# Patient Record
Sex: Male | Born: 1960 | Race: White | Hispanic: No | Marital: Single | Smoking: Current every day smoker
Health system: Southern US, Community
[De-identification: ages and names within clinical notes are randomized; demographics above are authoritative.]

## PROBLEM LIST (undated history)

## (undated) DIAGNOSIS — Z8719 Personal history of other diseases of the digestive system: Secondary | ICD-10-CM

## (undated) DIAGNOSIS — Z87898 Personal history of other specified conditions: Secondary | ICD-10-CM

## (undated) DIAGNOSIS — K219 Gastro-esophageal reflux disease without esophagitis: Secondary | ICD-10-CM

## (undated) DIAGNOSIS — K859 Acute pancreatitis without necrosis or infection, unspecified: Secondary | ICD-10-CM

## (undated) DIAGNOSIS — R441 Visual hallucinations: Secondary | ICD-10-CM

## (undated) DIAGNOSIS — B1920 Unspecified viral hepatitis C without hepatic coma: Secondary | ICD-10-CM

## (undated) DIAGNOSIS — F1010 Alcohol abuse, uncomplicated: Secondary | ICD-10-CM

## (undated) DIAGNOSIS — T50901A Poisoning by unspecified drugs, medicaments and biological substances, accidental (unintentional), initial encounter: Secondary | ICD-10-CM

## (undated) HISTORY — PX: WRIST SURGERY: SHX841

## (undated) HISTORY — PX: FOOT SURGERY: SHX648

## (undated) HISTORY — DX: Poisoning by unspecified drugs, medicaments and biological substances, accidental (unintentional), initial encounter: T50.901A

## (undated) HISTORY — DX: Visual hallucinations: R44.1

---

## 2010-01-23 ENCOUNTER — Emergency Department: Payer: Self-pay | Admitting: Emergency Medicine

## 2014-08-24 DIAGNOSIS — Z720 Tobacco use: Secondary | ICD-10-CM | POA: Diagnosis present

## 2014-08-24 DIAGNOSIS — R7401 Elevation of levels of liver transaminase levels: Secondary | ICD-10-CM | POA: Diagnosis present

## 2014-08-24 DIAGNOSIS — F1010 Alcohol abuse, uncomplicated: Secondary | ICD-10-CM

## 2014-08-24 DIAGNOSIS — R251 Tremor, unspecified: Secondary | ICD-10-CM

## 2014-08-24 HISTORY — DX: Elevation of levels of liver transaminase levels: R74.01

## 2014-08-24 HISTORY — DX: Tremor, unspecified: R25.1

## 2014-08-25 DIAGNOSIS — K859 Acute pancreatitis without necrosis or infection, unspecified: Secondary | ICD-10-CM | POA: Diagnosis present

## 2014-08-25 DIAGNOSIS — B182 Chronic viral hepatitis C: Secondary | ICD-10-CM | POA: Insufficient documentation

## 2014-08-25 DIAGNOSIS — N3000 Acute cystitis without hematuria: Secondary | ICD-10-CM

## 2014-08-25 HISTORY — DX: Acute cystitis without hematuria: N30.00

## 2015-02-14 ENCOUNTER — Encounter: Payer: Self-pay | Admitting: Emergency Medicine

## 2015-02-14 ENCOUNTER — Emergency Department
Admission: EM | Admit: 2015-02-14 | Discharge: 2015-02-16 | Disposition: A | Discharge status: Home / Self Care | Payer: Self-pay | Attending: Emergency Medicine | Admitting: Emergency Medicine

## 2015-02-14 DIAGNOSIS — F10230 Alcohol dependence with withdrawal, uncomplicated: Secondary | ICD-10-CM

## 2015-02-14 DIAGNOSIS — F1010 Alcohol abuse, uncomplicated: Secondary | ICD-10-CM | POA: Insufficient documentation

## 2015-02-14 DIAGNOSIS — F10932 Alcohol use, unspecified with withdrawal with perceptual disturbance: Secondary | ICD-10-CM

## 2015-02-14 DIAGNOSIS — Z720 Tobacco use: Secondary | ICD-10-CM | POA: Insufficient documentation

## 2015-02-14 DIAGNOSIS — F1910 Other psychoactive substance abuse, uncomplicated: Secondary | ICD-10-CM | POA: Insufficient documentation

## 2015-02-14 DIAGNOSIS — F1090 Alcohol use, unspecified, uncomplicated: Secondary | ICD-10-CM

## 2015-02-14 HISTORY — DX: Unspecified viral hepatitis C without hepatic coma: B19.20

## 2015-02-14 HISTORY — DX: Alcohol abuse, uncomplicated: F10.10

## 2015-02-14 HISTORY — DX: Acute pancreatitis without necrosis or infection, unspecified: K85.90

## 2015-02-14 LAB — COMPREHENSIVE METABOLIC PANEL
ALBUMIN: 4.3 g/dL (ref 3.5–5.0)
ALK PHOS: 59 U/L (ref 38–126)
ALT: 150 U/L — ABNORMAL HIGH (ref 17–63)
ANION GAP: 10 (ref 5–15)
AST: 172 U/L — AB (ref 15–41)
BUN: 11 mg/dL (ref 6–20)
CO2: 26 mmol/L (ref 22–32)
CREATININE: 0.68 mg/dL (ref 0.61–1.24)
Calcium: 9.1 mg/dL (ref 8.9–10.3)
Chloride: 101 mmol/L (ref 101–111)
GFR calc Af Amer: >60 mL/min (ref >60)
GFR calc non Af Amer: >60 mL/min (ref >60)
Glucose, Bld: 132 mg/dL — ABNORMAL HIGH (ref 65–99)
POTASSIUM: 3.8 mmol/L (ref 3.5–5.1)
Sodium: 137 mmol/L (ref 135–145)
Total Bilirubin: 0.5 mg/dL (ref 0.3–1.2)
Total Protein: 8.5 g/dL — ABNORMAL HIGH (ref 6.5–8.1)

## 2015-02-14 LAB — CBC WITH DIFFERENTIAL/PLATELET
Basophils Absolute: 0.4 10*3/uL — ABNORMAL HIGH (ref 0–0.1)
Basophils Relative: 4 %
EOS PCT: 1 %
Eosinophils Absolute: 0.1 10*3/uL (ref 0–0.7)
HCT: 47.5 % (ref 40.0–52.0)
Hemoglobin: 16.1 g/dL (ref 13.0–18.0)
LYMPHS ABS: 3 10*3/uL (ref 1.0–3.6)
LYMPHS PCT: 37 %
MCH: 31.2 pg (ref 26.0–34.0)
MCHC: 33.8 g/dL (ref 32.0–36.0)
MCV: 92.4 fL (ref 80.0–100.0)
MONOS PCT: 6 %
Monocytes Absolute: 0.5 10*3/uL (ref 0.2–1.0)
Neutro Abs: 4.1 10*3/uL (ref 1.4–6.5)
Neutrophils Relative %: 52 %
Platelets: 209 10*3/uL (ref 150–440)
RBC: 5.14 MIL/uL (ref 4.40–5.90)
RDW: 12.8 % (ref 11.5–14.5)
WBC: 8 10*3/uL (ref 3.8–10.6)

## 2015-02-14 LAB — URINALYSIS COMPLETE WITH MICROSCOPIC (ARMC ONLY)
Bilirubin Urine: NEGATIVE
Glucose, UA: NEGATIVE mg/dL
HGB URINE DIPSTICK: NEGATIVE
Ketones, ur: NEGATIVE mg/dL
LEUKOCYTES UA: NEGATIVE
Nitrite: NEGATIVE
PROTEIN: NEGATIVE mg/dL
SQUAMOUS EPITHELIAL / LPF: NONE SEEN
Specific Gravity, Urine: 1.006 (ref 1.005–1.030)
pH: 5 (ref 5.0–8.0)

## 2015-02-14 LAB — ETHANOL: Alcohol, Ethyl (B): 319 mg/dL (ref <5)

## 2015-02-14 MED ORDER — LORAZEPAM 2 MG PO TABS: 0.0000 mg | ORAL_TABLET | Freq: Two times a day (BID) | ORAL | Status: DC

## 2015-02-14 MED ORDER — LORAZEPAM 2 MG PO TABS: 2.0000 mg | ORAL_TABLET | Freq: Once | ORAL | Status: DC

## 2015-02-14 MED ORDER — VITAMIN B-1 100 MG PO TABS
100.0000 mg | ORAL_TABLET | Freq: Every day | ORAL | Status: DC
  Administered 2015-02-14 – 2015-02-15 (×2): 100 mg via ORAL
  Filled 2015-02-14 (×2): qty 1

## 2015-02-14 MED ORDER — LORAZEPAM 2 MG PO TABS
0.0000 mg | ORAL_TABLET | Freq: Four times a day (QID) | ORAL | Status: DC
  Administered 2015-02-15: 2 mg via ORAL
  Filled 2015-02-14: qty 1

## 2015-02-14 NOTE — ED Notes (Signed)
Patient reports seeking help for alcohol addiction.  Reports he drinks from sun up to sun down, last drink was approx 5 minutes prior to arrival.

## 2015-02-14 NOTE — ED Notes (Signed)
Pt brought back to room 21b - changed into scrubs by nurse matt -

## 2015-02-14 NOTE — ED Notes (Signed)
BEHAVIORAL HEALTH ROUNDING Patient sleeping: No. Patient alert and oriented: yes Behavior appropriate: Yes.  ; If no, describe:  Nutrition and fluids offered: Yes  Toileting and hygiene offered: Yes  Sitter present: yes Law enforcement present: Yes  

## 2015-02-14 NOTE — ED Notes (Signed)
Patient assigned to appropriate care area. Patient oriented to unit/care area: Informed that, for their safety, care areas are designed for safety and monitored by security cameras at all times; and visiting hours explained to patient. Patient verbalizes understanding, and verbal contract for safety obtained.  Pt brought into ED BHU via sally port escorted by ED [employer]over [employer]. Pt wanded with metal detector for safety by ODS officer. Patient oriented to unit/care area: Pt informed of unit policies and procedures.  Informed that, for their safety, care areas are designed for safety and monitored by security cameras at all times; and visiting hours explained to patient. Patient verbalizes understanding, and verbal contract for safety obtained.Pt shown to their room 2.   BEHAVIORAL HEALTH ROUNDING Patient sleeping: No. Patient alert and oriented: yes Behavior appropriate: Yes.   Nutrition and fluids offered: Yes  Toileting and hygiene offered: Yes  Sitter present: q15 min observations and security camera monitoring Law enforcement present: Yes [employer]  ENVIRONMENTAL ASSESSMENT Potentially harmful objects out of patient reach: Yes.   Personal belongings secured: Yes.   Patient dressed in hospital provided attire only: Yes.   Plastic bags out of patient reach: Yes.   Patient care equipment removed: Yes.   Equipment and supplies removed: Yes.   Potentially toxic materials out of patient reach: Yes.   Sharps container removed or out of patient reach: Yes.    ED BHU [place] Is the patient under IVC or is there intent for IVC: No. Is the patient medically cleared: Yes.   Is there vacancy in the ED BHU: Yes.   Is the population mix appropriate for patient: Yes Is the patient awaiting placement in inpatient or outpatient setting: Yes, [facility]. Has the patient had a psychiatric consult: No.   Survey of unit performed for contraband, proper placement and condition of furniture,  tampering with fixtures in bathroom, shower, and each patient room: Yes.  ; Findings: none APPEARANCE/BEHAVIOR Cooperative, calm NEURO ASSESSMENT Orientation: A&O x4 Hallucinations: No.None noted (Hallucinations) Speech: Normal Gait: normal RESPIRATORY ASSESSMENT No respiratory distress noted CARDIOVASCULAR ASSESSMENT Skin color appropriate for age and race GASTROINTESTINAL ASSESSMENT no GI distress noted EXTREMITIES Moves all extremities PLAN OF CARE Provide calm/safe environment. Vital signs assessed twice daily. ED BHU Assessment once each 12-hour shift. Collaborate with intake RN daily or as condition indicates. Assure the ED provider has rounded once each shift. Provide and encourage hygiene. Provide redirection as needed. Assess for escalating behavior; address immediately and inform ED provider.  Assess family dynamic and appropriateness for visitation as needed: Yes.   Educate the patient/family about BHU procedures/visitation: Yes.

## 2015-02-14 NOTE — ED Notes (Signed)
Pt medicated with thiamine as ordered. Pt states still unable to give urine sample

## 2015-02-14 NOTE — ED Notes (Signed)

## 2015-02-14 NOTE — ED Notes (Signed)
Report given to beth bhu

## 2015-02-14 NOTE — ED Provider Notes (Signed)
[facility] Emergency Department Provider Note     Time seen: ----------------------------------------- 8:37 PM on 02/14/2015 -----------------------------------------    I have reviewed the triage vital signs and the nursing notes.   HISTORY  Chief Complaint Drug / Alcohol Assessment    HPI Luke Rogers. is a 54 y.o. male who presents ER seeking help with alcohol addiction. Patient states he drinks from the time he gets up to the time he goes to sleep. Patient states he can't make it through the night without being jittery and shaky. States he immediately wakes up and goes to the store buys beer and coffee. He then drinks throughout the day because he works mostly by himself. He states she's having some family problems and he needs to get help.   Past Medical History  Diagnosis Date  . Alcohol abuse   . Hepatitis C   . Pancreatitis     There are no active problems to display for this patient.   Past Surgical History  Procedure Laterality Date  . Wrist surgery Right   . Foot surgery Bilateral     As infant    Allergies Review of patient's allergies indicates no known allergies.  Social History History  Substance Use Topics  . Smoking status: Current Every Day Smoker -- 1.00 packs/day    Types: Cigarettes  . Smokeless tobacco: Never Used  . Alcohol Use: Yes    Review of Systems Constitutional: Negative for fever. Eyes: Negative for visual changes. ENT: Negative for sore throat. Cardiovascular: Negative for chest pain. Respiratory: Negative for shortness of breath. Gastrointestinal: Negative for abdominal pain, vomiting and diarrhea. Genitourinary: Negative for dysuria. Musculoskeletal: Negative for back pain. Skin: Negative for rash. Neurological: Negative for headaches, focal weakness or numbness.  10-point ROS otherwise negative.  ____________________________________________   PHYSICAL EXAM:  VITAL SIGNS: ED  Triage Vitals  Enc Vitals Group     BP --      Pulse --      Resp --      Temp --      Temp src --      SpO2 --      Weight --      Height --      Head Cir --      Peak Flow --      Pain Score --      Pain Loc --      Pain Edu? --      Excl. in [place]? --     Constitutional: Alert and oriented. Well appearing and in no distress. Eyes: Conjunctivae are normal.  ENT   Head: Normocephalic and atraumatic.   Nose: No congestion/rhinnorhea.   Mouth/Throat: Mucous membranes are moist.   Neck: No stridor. Cardiovascular: Normal rate, regular rhythm. Respiratory: Normal respiratory effort without tachypnea nor retractions. Breath sounds are clear and equal bilaterally.  Gastrointestinal: Soft and nontender. No distention. No abdominal bruits.  Musculoskeletal: Nontender with normal range of motion in all extremities. No joint effusions.  No lower extremity tenderness nor edema. Neurologic:  Normal speech and language. No gross focal neurologic deficits are appreciated. Speech is normal. No gait instability.  Skin:  Skin is warm, dry and intact. No rash noted. Psychiatric: Mood and affect are normal. Speech and behavior are normal. Patient exhibits appropriate insight and judgment. Patient denies suicidal or homicidal ideation ____________________________________________  ED COURSE:  Pertinent labs & imaging results that were available during my care of the patient were reviewed by  me and considered in my medical decision making (see chart for details). Patient with severe alcoholism, we'll place on CIWA scale and attempt placement. ____________________________________________    LABS (pertinent positives/negatives)  Labs Reviewed  CBC WITH DIFFERENTIAL/PLATELET - Abnormal; Notable for the following:    Basophils Absolute 0.4 (*)    All other components within normal limits  COMPREHENSIVE METABOLIC PANEL - Abnormal; Notable for the following:    Glucose, Bld 132 (*)     Total Protein 8.5 (*)    AST 172 (*)    ALT 150 (*)    All other components within normal limits  URINALYSIS COMPLETEWITH MICROSCOPIC ([facility] ONLY) - Abnormal; Notable for the following:    Color, Urine YELLOW (*)    APPearance CLEAR (*)    Bacteria, UA FEW (*)    All other components within normal limits  ETHANOL - Abnormal; Notable for the following:    Alcohol, Ethyl (B) 319 (*)    All other components within normal limits  URINE DRUG SCREEN, QUALITATIVE ([facility] ONLY)    ____________________________________________  FINAL ASSESSMENT AND PLAN  Severe alcoholism  Plan: Patient with labs and imaging as dictated above. Patient with severe alcoholism, will need help with withdrawal or will go on to severe DTs. Patient being started on CIWA protocol, we'll have him speak to psychiatry.   Earleen Newport, MD   Earleen Newport, MD 02/15/15 [phone]

## 2015-02-14 NOTE — ED Notes (Signed)
Pt okay to go to bhu per physician

## 2015-02-15 ENCOUNTER — Encounter: Payer: Self-pay | Admitting: Emergency Medicine

## 2015-02-15 NOTE — ED Notes (Signed)
BEHAVIORAL HEALTH ROUNDING Patient sleeping: Yes.   Patient alert and oriented: not applicable Behavior appropriate: Yes.    Nutrition and fluids offered: No Toileting and hygiene offered: No Sitter present: q15 minute observations and security camera monitoring Law enforcement present: Yes [employer] 

## 2015-02-15 NOTE — ED Notes (Signed)
Pt. Noted sleeping in room. No complaints or concerns voiced. No distress or abnormal behavior noted. Will continue to monitor with security cameras. Q 15 minute rounds continue. 

## 2015-02-15 NOTE — ED Notes (Signed)

## 2015-02-15 NOTE — ED Notes (Signed)
BEHAVIORAL HEALTH ROUNDING Patient sleeping: Yes.   Patient alert and oriented: not applicable Behavior appropriate: Yes.  ; If no, describe:  Nutrition and fluids offered: Yes  Toileting and hygiene offered: Yes  Sitter present: yes 15 minute rounds being completed Law enforcement present: Yes [employer]

## 2015-02-15 NOTE — ED Notes (Signed)
BEHAVIORAL HEALTH ROUNDING Patient sleeping: NO Patient alert and oriented: yes Behavior appropriate: Yes.  ; If no, describe:  Nutrition and fluids offered: Yes  Toileting and hygiene offered: Yes  Sitter present: yes 15 minute checks being completed Law enforcement present: Yes [employer]

## 2015-02-15 NOTE — ED Notes (Signed)
ED BHU [place] Is the patient under IVC or is there intent for IVC: No. Is the patient medically cleared: Yes.   Is there vacancy in the ED BHU: Yes.   Is the population mix appropriate for patient: Yes.   Is the patient awaiting placement in inpatient or outpatient setting: No. Has the patient had a psychiatric consult: No. Survey of unit performed for contraband, proper placement and condition of furniture, tampering with fixtures in bathroom, shower, and each patient room: Yes.  ; Findings:  APPEARANCE/BEHAVIOR calm NEURO ASSESSMENT Orientation: person, place, and time Hallucinations: No.None noted (Hallucinations) Speech: Normal Gait: normal RESPIRATORY ASSESSMENT Breathing Pattern: Regular respirations noted CARDIOVASCULAR ASSESSMENT regular rate and rhythm, S1, S2 normal, no murmur, click, rub or gallop GASTROINTESTINAL ASSESSMENT soft, nontender, BS WNL, no r/g EXTREMITIES normal strength, tone, and muscle mass PLAN OF CARE Provide calm/safe environment. Vital signs assessed twice daily. ED BHU Assessment once each 12-hour shift. Collaborate with intake RN daily or as condition indicates. Assure the ED provider has rounded once each shift. Provide and encourage hygiene. Provide redirection as needed. Assess for escalating behavior; address immediately and inform ED provider.  Assess family dynamic and appropriateness for visitation as needed: Yes.  ; If necessary, describe findings:  Educate the patient/family about BHU procedures/visitation: Yes.  ; If necessary, describe findings:

## 2015-02-15 NOTE — ED Notes (Signed)
BEHAVIORAL HEALTH ROUNDING Patient sleeping: Yes.   Patient alert and oriented: not applicable Behavior appropriate: Yes.  ; If no, describe:  Nutrition and fluids offered: Yes  Toileting and hygiene offered: Yes  Sitter present: yes 15 minute checks being completed Law enforcement present: Yes [employer]

## 2015-02-15 NOTE — ED Notes (Signed)
BEHAVIORAL HEALTH ROUNDING Patient sleeping: Yes.   Patient alert and oriented: not applicable Behavior appropriate: Yes.  ; If no, describe:  Nutrition and fluids offered: Yes  Toileting and hygiene offered: Yes  Sitter present: yes 15 minute safety rounds completed Law enforcement present: Yes [employer]

## 2015-02-15 NOTE — ED Notes (Signed)
Report received from [facility]. Pt. Alert and oriented in no distress denies SI, HI, AVH and pain.  Pt. Instructed to come to me with problems or concerns.Will continue to monitor for safety via security cameras and Q 15 minute checks.

## 2015-02-15 NOTE — ED Notes (Signed)
BEHAVIORAL HEALTH ROUNDING Patient sleeping: No. Patient alert and oriented: yes Behavior appropriate: Yes.   Nutrition and fluids offered: Yes  Toileting and hygiene offered: Yes  Sitter present: q15 min observations and security camera monitoring Law enforcement present: Yes [employer] 

## 2015-02-15 NOTE — ED Notes (Signed)
BEHAVIORAL HEALTH ROUNDING Patient sleeping: No. Patient alert and oriented: yes Behavior appropriate: Yes.  ; If no, describe:  Nutrition and fluids offered: Yes  Toileting and hygiene offered: Yes  Sitter present: yes 15 min safety rounds being completed Law enforcement present: Yes [employer]
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
## 2015-02-15 NOTE — ED Notes (Signed)
BEHAVIORAL HEALTH ROUNDING Patient sleeping: No. Patient alert and oriented: yes Behavior appropriate: Yes.  ; If no, describe:  Nutrition and fluids offered: Yes  Toileting and hygiene offered: Yes  Sitter present: yes 15 minute safety rounds completed and documented Law enforcement present: Yes [employer]

## 2015-02-15 NOTE — ED Notes (Signed)
Pt. Noted in room. No complaints or concerns voiced. No distress or abnormal behavior noted. Will continue to monitor with security cameras. Q 15 minute rounds continue. 

## 2015-02-15 NOTE — Progress Notes (Signed)
LCSW collected data and made a referral to [facility] and faxed it [phone] awaiting a call back

## 2015-02-15 NOTE — ED Provider Notes (Signed)
-----------------------------------------   7:20 AM on 02/15/2015 -----------------------------------------   BP 114/72 mmHg  Pulse 66  Resp 18  SpO2 97%  No acute events overnight. Vitals reviewed. Patient remains medically cleared.  Disposition is pending per Psychiatry/Behavioral Medicine team recommendations.   Lavonia Drafts, MD 02/15/15 [phone]

## 2015-02-15 NOTE — ED Notes (Signed)
Pt. Noted in room. No complaints or concerns voiced. No distress or abnormal behavior noted. Will continue to monitor with security cameras. Q 15 minute rounds continue. Sandwich and soft drink given. 

## 2015-02-15 NOTE — BHH Counselor (Signed)
This Probation officer spoke directly with Luke Rogers ([facility] Intake Admissions) who states pt is declined for Tx due to upcoming court date scheduled for 02/16/2015.

## 2015-02-16 DIAGNOSIS — F10932 Alcohol use, unspecified with withdrawal with perceptual disturbance: Secondary | ICD-10-CM

## 2015-02-16 DIAGNOSIS — F10230 Alcohol dependence with withdrawal, uncomplicated: Secondary | ICD-10-CM

## 2015-02-16 DIAGNOSIS — F1090 Alcohol use, unspecified, uncomplicated: Secondary | ICD-10-CM

## 2015-02-16 HISTORY — DX: Alcohol use, unspecified with withdrawal with perceptual disturbance: F10.932

## 2015-02-16 NOTE — ED Notes (Signed)
Patient observed lying in bed with eyes closed  Even, unlabored respirations observed   NAD pt appears to be sleeping  I will continue to monitor along with every 15 minute visual observations and ongoing security camera monitoring    

## 2015-02-16 NOTE — ED Notes (Signed)
Pt. Noted sleeping in room. No complaints or concerns voiced. No distress or abnormal behavior noted. Will continue to monitor with security cameras. Q 15 minute rounds continue. 

## 2015-02-16 NOTE — BHH Counselor (Signed)
Pt to be reassessed by Psych MD per Dr.Kumar

## 2015-02-16 NOTE — ED Notes (Signed)
BEHAVIORAL HEALTH ROUNDING Patient sleeping: No. Patient alert and oriented: yes Behavior appropriate: Yes.  ; If no, describe:  Nutrition and fluids offered: yes Toileting and hygiene offered: Yes  Sitter present: q15 minute observations and security camera monitoring Law enforcement present: Yes  [facility]  

## 2015-02-16 NOTE — ED Notes (Signed)
Pt to be discharged to home with follow up at [facility]  1/1 bags of belongings returned to him and he verbalized that he received back all items that he came here with   Discharge instructions reviewed with him and he verbalized agreement and understanding

## 2015-02-16 NOTE — Progress Notes (Signed)
LCSW received a call from [facility] admissionsClair Rogers). Patient was denied because he has several court dates this week inclusive of today.

## 2015-02-16 NOTE — ED Provider Notes (Signed)
-----------------------------------------   9:59 AM on 02/16/2015 -----------------------------------------   BP 115/69 mmHg  Pulse 80  Temp(Src) 97.9 F (36.6 C) (Oral)  Resp 16  SpO2 100%  No acute events since last update.  Acting appropriately.  Disposition is pending per Psychiatry/Behavioral Medicine team recommendations.     Nance Pear, MD 02/16/15 [phone]

## 2015-02-16 NOTE — ED Notes (Signed)
Breakfast provided  Appropriate to stimulation  No verbalized needs or concerns at this time  NAD assessed  Continue to monitor 

## 2015-02-16 NOTE — ED Provider Notes (Signed)
-----------------------------------------   3:17 PM on 02/16/2015 -----------------------------------------  Psychiatry has seen the patient. They feel he is safe for discharge home. [facility] is unable to take patient at this time. Patient has plan to go to [place] to try to get into a rehab facility there.  Nance Pear, MD 02/16/15 [phone]

## 2015-02-16 NOTE — ED Notes (Signed)
Lunch provided along with an extra drink  Pt observed with no unusual behavior  Appropriate to stimulation  No verbalized needs or concerns at this time  NAD assessed  Continue to monitor 

## 2015-02-16 NOTE — ED Notes (Signed)
ED BHU [place] Is the patient under IVC or is there intent for IVC: no Is the patient medically cleared: Yes.   Is there vacancy in the ED BHU: Yes.   Is the population mix appropriate for patient: Yes.   Is the patient awaiting placement in inpatient or outpatient setting: No. Has the patient had a psychiatric consult: No. Survey of unit performed for contraband, proper placement and condition of furniture, tampering with fixtures in bathroom, shower, and each patient room: Yes.  ; Findings:  APPEARANCE/BEHAVIOR Calm and cooperative NEURO ASSESSMENT Orientation: oriented x3  Denies pain Hallucinations: No.None noted (Hallucinations) Speech: Normal Gait: normal RESPIRATORY ASSESSMENT even unlabored respirations  CARDIOVASCULAR ASSESSMENT Pulses equal  regular rate  Skin warm and dry GASTROINTESTINAL ASSESSMENT no GI complaint EXTREMITIES Full ROM PLAN OF CARE Provide calm/safe environment. Vital signs assessed twice daily. ED BHU Assessment once each 12-hour shift. Collaborate with intake RN daily or as condition indicates. Assure the ED provider has rounded once each shift. Provide and encourage hygiene. Provide redirection as needed. Assess for escalating behavior; address immediately and inform ED provider.  Assess family dynamic and appropriateness for visitation as needed: Yes.  ; If necessary, describe findings:  Educate the patient/family about BHU procedures/visitation: Yes.  ; If necessary, describe findings:

## 2015-02-16 NOTE — Consult Note (Signed)
[facility] Psychiatry Consult   Reason for Consult:  Follow-up consult for this 54 year old man with history of alcohol abuse. Referring Physician:  Archie Balboa Patient Identification: Luke Rogers. MRN:  [id number] Principal Diagnosis: Alcohol dependence with uncomplicated withdrawal Diagnosis:   Patient Active Problem List   Diagnosis Date Noted  . Alcohol dependence with uncomplicated withdrawal [G29.528] 02/16/2015    Total Time spent with patient: 30 minutes  Subjective:   Luke Yamashiro. is a 54 y.o. male patient admitted with "I'm feeling much better.".  HPI:  Patient came to the emergency room a couple days ago with a blood alcohol level over 300. He was requesting detox. At first he was referred to residential treatment services as he was not expressing any suicidal ideation and did not appear to have another acute psychiatric problem. Referral was made to residential treatment services and they have declined admission based on the fact that he has pending criminal charges. Patient states that he was drinking between 12 and 24 beers a day and had been doing so for many months. He decided that he was fed up with the way he was behaving and wanted to live better for his family so we decided to come and get help. He smokes a little bit of marijuana but denies any other drugs. Says that his mood is been pretty good. A little bit irritable and down when he was intoxicated. Denies any suicidal ideation. Doing much better now.  Past psychiatric history denies any history of suicide attempts. Has had a long history of alcohol abuse. Most of his "treatment" has taken place in prison. He has been able to stop for up to a year at a time when he was outside of prison. No other past mental health treatment.  Social history: Patient lives with his wife and stepdaughter. He has a long criminal record and has charges pending now. Wife appears to be supportive.  Medical history: Denies  any significant medical problems. No history of high blood pressure or diabetes. It is listed here that he is hepatitis C positive.  Family history: Nonidentified  Current medications none HPI Elements:   Quality:  Alcohol abuse with alcohol withdrawal. No delirium or seizures. Severity:  Moderate. Timing:  Has detoxed over the last couple days. Duration:  Symptoms much improved now. Had been drinking for months.. Context:  Ongoing alcohol abuse but desire now to stop drinking.  Past Medical History:  Past Medical History  Diagnosis Date  . Alcohol abuse   . Hepatitis C   . Pancreatitis     Past Surgical History  Procedure Laterality Date  . Wrist surgery Right   . Foot surgery Bilateral     As infant   Family History: History reviewed. No pertinent family history. Social History:  History  Alcohol Use  . Yes     History  Drug Use No    History   Social History  . Marital Status: Married    Spouse Name: N/A  . Number of Children: N/A  . Years of Education: N/A   Social History Main Topics  . Smoking status: Current Every Day Smoker -- 1.00 packs/day    Types: Cigarettes  . Smokeless tobacco: Never Used  . Alcohol Use: Yes  . Drug Use: No  . Sexual Activity: Not on file   Other Topics Concern  . None   Social History Narrative   Additional Social History:    History of alcohol / drug use?:  Yes Name of Substance 1: Alcohol 1 - Age of First Use: 19 1 - Amount (size/oz): 12-13 Beers 1 - Frequency: "Daily" 1 - Duration: "years" 1 - Last Use / Amount: "Tonight in the parking lot"                   Allergies:  No Known Allergies  Labs:  Results for orders placed or performed during the hospital encounter of 02/14/15 (from the past 48 hour(s))  CBC with Differential/Platelet     Status: Abnormal   Collection Time: 02/14/15  8:19 PM  Result Value Ref Range   WBC 8.0 3.8 - 10.6 K/uL   RBC 5.14 4.40 - 5.90 MIL/uL   Hemoglobin 16.1 13.0 - 18.0 g/dL    HCT 47.5 40.0 - 52.0 %   MCV 92.4 80.0 - 100.0 fL   MCH 31.2 26.0 - 34.0 pg   MCHC 33.8 32.0 - 36.0 g/dL   RDW 12.8 11.5 - 14.5 %   Platelets 209 150 - 440 K/uL   Neutrophils Relative % 52 %   Neutro Abs 4.1 1.4 - 6.5 K/uL   Lymphocytes Relative 37 %   Lymphs Abs 3.0 1.0 - 3.6 K/uL   Monocytes Relative 6 %   Monocytes Absolute 0.5 0.2 - 1.0 K/uL   Eosinophils Relative 1 %   Eosinophils Absolute 0.1 0 - 0.7 K/uL   Basophils Relative 4 %   Basophils Absolute 0.4 (H) 0 - 0.1 K/uL  Comprehensive metabolic panel     Status: Abnormal   Collection Time: 02/14/15  8:19 PM  Result Value Ref Range   Sodium 137 135 - 145 mmol/L   Potassium 3.8 3.5 - 5.1 mmol/L   Chloride 101 101 - 111 mmol/L   CO2 26 22 - 32 mmol/L   Glucose, Bld 132 (H) 65 - 99 mg/dL   BUN 11 6 - 20 mg/dL   Creatinine, Ser 0.68 0.61 - 1.24 mg/dL   Calcium 9.1 8.9 - 10.3 mg/dL   Total Protein 8.5 (H) 6.5 - 8.1 g/dL   Albumin 4.3 3.5 - 5.0 g/dL   AST 172 (H) 15 - 41 U/L   ALT 150 (H) 17 - 63 U/L   Alkaline Phosphatase 59 38 - 126 U/L   Total Bilirubin 0.5 0.3 - 1.2 mg/dL   GFR calc non Af Amer >60 >60 mL/min   GFR calc Af Amer >60 >60 mL/min    Comment: (NOTE) The eGFR has been calculated using the CKD EPI equation. This calculation has not been validated in all clinical situations. eGFR's persistently <60 mL/min signify possible Chronic Kidney Disease.    Anion gap 10 5 - 15  Ethanol     Status: Abnormal   Collection Time: 02/14/15  8:19 PM  Result Value Ref Range   Alcohol, Ethyl (B) 319 (HH) <5 mg/dL    Comment: CRITICAL RESULT CALLED TO, READ BACK BY AND VERIFIED WITH SUSAN NEAL _0  02/14/15 .Marland KitchenMarland KitchenAJO        LOWEST DETECTABLE LIMIT FOR SERUM ALCOHOL IS 5 mg/dL FOR MEDICAL PURPOSES ONLY   Urinalysis complete, with microscopic ([facility] only)     Status: Abnormal   Collection Time: 02/14/15 11:42 PM  Result Value Ref Range   Color, Urine YELLOW (A) YELLOW   APPearance CLEAR (A) CLEAR   Glucose, UA  NEGATIVE NEGATIVE mg/dL   Bilirubin Urine NEGATIVE NEGATIVE   Ketones, ur NEGATIVE NEGATIVE mg/dL   Specific Gravity, Urine 1.006 1.005 - 1.030  Hgb urine dipstick NEGATIVE NEGATIVE   pH 5.0 5.0 - 8.0   Protein, ur NEGATIVE NEGATIVE mg/dL   Nitrite NEGATIVE NEGATIVE   Leukocytes, UA NEGATIVE NEGATIVE   RBC / HPF 0-5 0 - 5 RBC/hpf   WBC, UA 0-5 0 - 5 WBC/hpf   Bacteria, UA FEW (A) NONE SEEN   Squamous Epithelial / LPF NONE SEEN NONE SEEN   Mucous PRESENT     Vitals: Blood pressure 115/69, pulse 80, temperature 97.9 F (36.6 C), temperature source Oral, resp. rate 16, SpO2 100 %.  Risk to Self: Suicidal Ideation: No Suicidal Intent: No Is patient at risk for suicide?: No Suicidal Plan?: No Access to Means: No What has been your use of drugs/alcohol within the last 12 months?: Alcohol How many times?:  (N/A) Other Self Harm Risks: N/A Triggers for Past Attempts: None known Intentional Self Injurious Behavior: None Risk to Others: Homicidal Ideation: No Thoughts of Harm to Others: No Current Homicidal Intent: No Current Homicidal Plan: No Access to Homicidal Means: No Identified Victim: N/A History of harm to others?: No Assessment of Violence: None Noted Violent Behavior Description: N/A Does patient have access to weapons?: No Criminal Charges Pending?: Yes Describe Pending Criminal Charges: DWI; DWLRevoked Does patient have a court date: Yes Court Date:  (02/16/2015; 02/23/2015; 03/18/2015) Prior Inpatient Therapy: Prior Inpatient Therapy: Yes Prior Therapy Dates: "Less than 4 months ago" Prior Therapy Facilty/Provider(s): [facility] Reason for Treatment: Substance Abuse Tx Prior Outpatient Therapy: Prior Outpatient Therapy: No (Pt reports he did not follow-up with Tx) Prior Therapy Dates: N/A Prior Therapy Facilty/Provider(s): N/A Reason for Treatment: N/A Does patient have an ACCT team?: No Does patient have Intensive In-House Services?  : No Does patient have [facility]  services? : No Does patient have [facility] services?: No  Current Facility-Administered Medications  Medication Dose Route Frequency Provider Last Rate Last Dose  . LORazepam (ATIVAN) tablet 0-4 mg  0-4 mg Oral 4 times per day Earleen Newport, MD   Stopped at 02/16/15 [id number]  . LORazepam (ATIVAN) tablet 0-4 mg  0-4 mg Oral Q12H Earleen Newport, MD      . LORazepam (ATIVAN) tablet 2 mg  2 mg Oral Once Earleen Newport, MD      . thiamine (VITAMIN B-1) tablet 100 mg  100 mg Oral Daily Earleen Newport, MD   100 mg at 02/15/15 1029   No current outpatient prescriptions on file.    Musculoskeletal: Strength & Muscle Tone: within normal limits Gait & Station: normal Patient leans: N/A  Psychiatric Specialty Exam: Physical Exam  Constitutional: He appears well-developed and well-nourished.  HENT:  Head: Normocephalic and atraumatic.  Eyes: Conjunctivae are normal. Pupils are equal, round, and reactive to light.  Neck: Normal range of motion.  Cardiovascular: Normal heart sounds.   Respiratory: Effort normal.  GI: Soft.  Musculoskeletal: Normal range of motion.  Neurological: He is alert.  Skin: Skin is warm and dry.  Psychiatric: He has a normal mood and affect. His speech is normal and behavior is normal. Judgment and thought content normal. Cognition and memory are normal.  Patient seen today. He is alert and oriented. Mood is good. No suicidal thoughts no evidence of psychosis. Cognitively intact. Good insight    Review of Systems  Constitutional: Negative.   HENT: Negative.   Eyes: Negative.   Respiratory: Negative.   Cardiovascular: Negative.   Gastrointestinal: Negative.   Musculoskeletal: Negative.   Skin: Negative.   Neurological: Negative.  Psychiatric/Behavioral: Negative.     Blood pressure 115/69, pulse 80, temperature 97.9 F (36.6 C), temperature source Oral, resp. rate 16, SpO2 100 %.There is no height or weight on file to calculate BMI.  General  Appearance: Casual  Eye Contact::  Good  Speech:  Clear and Coherent  Volume:  Normal  Mood:  Euthymic  Affect:  Congruent  Thought Process:  Coherent  Orientation:  Full (Time, Place, and Person)  Thought Content:  Negative  Suicidal Thoughts:  No  Homicidal Thoughts:  No  Memory:  Immediate;   Good Recent;   Good Remote;   Fair  Judgement:  Intact  Insight:  Fair  Psychomotor Activity:  Normal  Concentration:  Fair  Recall:  [employer] of Knowledge:Fair  Language: Fair  Akathisia:  No  Handed:  Right  AIMS (if indicated):     Assets:  Communication Skills Desire for Improvement Housing Physical Health Social Support  ADL's:  Intact  Cognition: WNL  Sleep:      Medical Decision Making: Established Problem, Stable/Improving (1), Review of Psycho-Social Stressors (1), Review or order clinical lab tests (1) and Review or order medicine tests (1)  Treatment Plan Summary: Plan Patient has been declined residential treatment services. He is not under involuntary commitment. He is now requesting discharge. He has not required any medicine for control of withdrawal symptoms for the last day. He is having normal vital signs. No sign of seizures or delirium. Insight is good. Patient's mood is good and he has no suicidal or homicidal ideas. He states that he intends to go to [place] with his wife as she has identified some treatment programs there. He is optimistic about treatment. Counseling completed. Case discussed with emergency room physician. Patient can be discharged from the emergency room at this time no medications written.  Plan:  Patient does not meet criteria for psychiatric inpatient admission. Discussed crisis plan, support from social network, calling 911, coming to the Emergency Department, and calling Suicide Hotline. Disposition: Discharged with the patient follow-up with outpatient substance abuse treatment  Alethia Berthold 02/16/2015 3:16 PM

## 2015-02-16 NOTE — ED Notes (Signed)
[facility] has consulted

## 2015-02-16 NOTE — ED Notes (Signed)
ENVIRONMENTAL ASSESSMENT Potentially harmful objects out of patient reach: Yes.   Personal belongings secured: Yes.   Patient dressed in hospital provided attire only: Yes.   Plastic bags out of patient reach: Yes.   Patient care equipment (cords, cables, call bells, lines, and drains) shortened, removed, or accounted for: Yes.   Equipment and supplies removed from bottom of stretcher: Yes.   Potentially toxic materials out of patient reach: Yes.   Sharps container removed or out of patient reach: Yes.     BEHAVIORAL HEALTH ROUNDING Patient sleeping: No. Patient alert and oriented: yes Behavior appropriate: Yes.  ; If no, describe:  Nutrition and fluids offered: yes Toileting and hygiene offered: Yes  Sitter present: q15 minute observations and security camera monitoring Law enforcement present: Yes  ODS

## 2015-02-16 NOTE — ED Notes (Addendum)
Pt has received a phone call from his spouse and he has made a phone call  - pt requesting to be referred to [facility]  "I want medical clearance to [facility] think that there is one in [place] - my wife will take me I just need clearance and a referral."     Pt informed of his pending psych consult

## 2015-02-16 NOTE — ED Notes (Signed)
Assessment completed  Pt denies pain  Pt to the shower at this time  [facility] has declined pt acceptance due to multiple court dates including one today 02/16/15   Pt stating  "If ya'll can't place me somewhere then I am ready to go."

## 2015-02-16 NOTE — Discharge Instructions (Signed)
Please seek medical attention and help for any thoughts about wanting to harm herself, harm others, any concerning change in behavior, severe depression, inappropriate drug use or any other new or concerning symptoms. ° °Alcohol Use Disorder °Alcohol use disorder is a mental disorder. It is not a one-time incident of heavy drinking. Alcohol use disorder is the excessive and uncontrollable use of alcohol over time that leads to problems with functioning in one or more areas of daily living. People with this disorder risk harming themselves and others when they drink to excess. Alcohol use disorder also can cause other mental disorders, such as mood and anxiety disorders, and serious physical problems. People with alcohol use disorder often misuse other drugs.  °Alcohol use disorder is common and widespread. Some people with this disorder drink alcohol to cope with or escape from negative life events. Others drink to relieve chronic pain or symptoms of mental illness. People with a family history of alcohol use disorder are at higher risk of losing control and using alcohol to excess.  °SYMPTOMS  °Signs and symptoms of alcohol use disorder may include the following:  °· Consumption of alcohol in larger amounts or over a longer period of time than intended. °· Multiple unsuccessful attempts to cut down or control alcohol use.   °· A great deal of time spent obtaining alcohol, using alcohol, or recovering from the effects of alcohol (hangover). °· A strong desire or urge to use alcohol (cravings).   °· Continued use of alcohol despite problems at work, school, or home because of alcohol use.   °· Continued use of alcohol despite problems in relationships because of alcohol use. °· Continued use of alcohol in situations when it is physically hazardous, such as driving a car. °· Continued use of alcohol despite awareness of a physical or psychological problem that is likely related to alcohol use. Physical problems  related to alcohol use can involve the brain, heart, liver, stomach, and intestines. Psychological problems related to alcohol use include intoxication, depression, anxiety, psychosis, delirium, and dementia.   °· The need for increased amounts of alcohol to achieve the same desired effect, or a decreased effect from the consumption of the same amount of alcohol (tolerance). °· Withdrawal symptoms upon reducing or stopping alcohol use, or alcohol use to reduce or avoid withdrawal symptoms. Withdrawal symptoms include: °¨ Racing heart. °¨ Hand tremor. °¨ Difficulty sleeping. °¨ Nausea. °¨ Vomiting. °¨ Hallucinations. °¨ Restlessness. °¨ Seizures. °DIAGNOSIS °Alcohol use disorder is diagnosed through an assessment by your health care provider. Your health care provider may start by asking three or four questions to screen for excessive or problematic alcohol use. To confirm a diagnosis of alcohol use disorder, at least two symptoms must be present within a 12-month period. The severity of alcohol use disorder depends on the number of symptoms: °· Mild--two or three. °· Moderate--four or five. °· Severe--six or more. °Your health care provider may perform a physical exam or use results from lab tests to see if you have physical problems resulting from alcohol use. Your health care provider may refer you to a mental health professional for evaluation. °TREATMENT  °Some people with alcohol use disorder are able to reduce their alcohol use to low-risk levels. Some people with alcohol use disorder need to quit drinking alcohol. When necessary, mental health professionals with specialized training in substance use treatment can help. Your health care provider can help you decide how severe your alcohol use disorder is and what type of treatment you need. The following forms of   treatment are available:  °· Detoxification. Detoxification involves the use of prescription medicines to prevent alcohol withdrawal symptoms in the  first week after quitting. This is important for people with a history of symptoms of withdrawal and for heavy drinkers who are likely to have withdrawal symptoms. Alcohol withdrawal can be dangerous and, in severe cases, cause death. Detoxification is usually provided in a hospital or in-patient substance use treatment facility. °· Counseling or talk therapy. Talk therapy is provided by substance use treatment counselors. It addresses the reasons people use alcohol and ways to keep them from drinking again. The goals of talk therapy are to help people with alcohol use disorder find healthy activities and ways to cope with life stress, to identify and avoid triggers for alcohol use, and to handle cravings, which can cause relapse. °· Medicines. Different medicines can help treat alcohol use disorder through the following actions: °¨ Decrease alcohol cravings. °¨ Decrease the positive reward response felt from alcohol use. °¨ Produce an uncomfortable physical reaction when alcohol is used (aversion therapy). °· Support groups. Support groups are run by people who have quit drinking. They provide emotional support, advice, and guidance. °These forms of treatment are often combined. Some people with alcohol use disorder benefit from intensive combination treatment provided by specialized substance use treatment centers. Both inpatient and outpatient treatment programs are available. °Document Released: 08/17/2004 Document Revised: 11/24/2013 Document Reviewed: 10/17/2012 °[employer]® Patient Information ©2015 [employer]. This information is not intended to replace advice given to you by your health care provider. Make sure you discuss any questions you have with your health care provider. ° °

## 2018-01-08 ENCOUNTER — Inpatient Hospital Stay
Admission: EM | Admit: 2018-01-08 | Discharge: 2018-01-12 | DRG: 872 | Disposition: A | Discharge status: Home / Self Care | Payer: Self-pay | Attending: Internal Medicine | Admitting: Internal Medicine

## 2018-01-08 ENCOUNTER — Emergency Department: Payer: Self-pay

## 2018-01-08 ENCOUNTER — Other Ambulatory Visit: Payer: Self-pay

## 2018-01-08 DIAGNOSIS — G039 Meningitis, unspecified: Secondary | ICD-10-CM

## 2018-01-08 DIAGNOSIS — G009 Bacterial meningitis, unspecified: Secondary | ICD-10-CM

## 2018-01-08 DIAGNOSIS — F17210 Nicotine dependence, cigarettes, uncomplicated: Secondary | ICD-10-CM | POA: Diagnosis present

## 2018-01-08 DIAGNOSIS — Z6821 Body mass index (BMI) 21.0-21.9, adult: Secondary | ICD-10-CM

## 2018-01-08 DIAGNOSIS — A879 Viral meningitis, unspecified: Secondary | ICD-10-CM | POA: Diagnosis present

## 2018-01-08 DIAGNOSIS — F1010 Alcohol abuse, uncomplicated: Secondary | ICD-10-CM | POA: Diagnosis present

## 2018-01-08 DIAGNOSIS — R059 Cough, unspecified: Secondary | ICD-10-CM

## 2018-01-08 DIAGNOSIS — E43 Unspecified severe protein-calorie malnutrition: Secondary | ICD-10-CM

## 2018-01-08 DIAGNOSIS — I959 Hypotension, unspecified: Secondary | ICD-10-CM | POA: Diagnosis present

## 2018-01-08 DIAGNOSIS — R05 Cough: Secondary | ICD-10-CM

## 2018-01-08 DIAGNOSIS — B1920 Unspecified viral hepatitis C without hepatic coma: Secondary | ICD-10-CM | POA: Diagnosis present

## 2018-01-08 DIAGNOSIS — E440 Moderate protein-calorie malnutrition: Secondary | ICD-10-CM | POA: Diagnosis present

## 2018-01-08 DIAGNOSIS — A419 Sepsis, unspecified organism: Principal | ICD-10-CM | POA: Diagnosis present

## 2018-01-08 DIAGNOSIS — E871 Hypo-osmolality and hyponatremia: Secondary | ICD-10-CM | POA: Diagnosis present

## 2018-01-08 HISTORY — DX: Bacterial meningitis, unspecified: G00.9

## 2018-01-08 LAB — CSF CELL COUNT WITH DIFFERENTIAL
EOS CSF: 0 %
Eosinophils, CSF: 0 %
Lymphs, CSF: 74 %
Lymphs, CSF: 77 %
MONOCYTE-MACROPHAGE-SPINAL FLUID: 22 %
Monocyte-Macrophage-Spinal Fluid: 19 %
RBC COUNT CSF: 4452 /mm3 — AB (ref 0–3)
RBC Count, CSF: 6295 /mm3 — ABNORMAL HIGH (ref 0–3)
Segmented Neutrophils-CSF: 4 %
Segmented Neutrophils-CSF: 4 %
TUBE #: 4
Tube #: 1
WBC, CSF: 23 /mm3 (ref 0–5)
WBC, CSF: 45 /mm3 (ref 0–5)

## 2018-01-08 LAB — CBC WITH DIFFERENTIAL/PLATELET
BASOS ABS: 0.1 10*3/uL (ref 0–0.1)
BASOS PCT: 1 %
Basophils Absolute: 0.1 10*3/uL (ref 0–0.1)
Basophils Relative: 1 %
EOS ABS: 0 10*3/uL (ref 0–0.7)
EOS ABS: 0 10*3/uL (ref 0–0.7)
EOS PCT: 0 %
EOS PCT: 0 %
HCT: 47 % (ref 40.0–52.0)
HCT: 40.1 % (ref 40.0–52.0)
Hemoglobin: 16.3 g/dL (ref 13.0–18.0)
Hemoglobin: 14.3 g/dL (ref 13.0–18.0)
LYMPHS PCT: 36 %
Lymphocytes Relative: 17 %
Lymphs Abs: 4.5 10*3/uL — ABNORMAL HIGH (ref 1.0–3.6)
Lymphs Abs: 1.6 10*3/uL (ref 1.0–3.6)
MCH: 31.1 pg (ref 26.0–34.0)
MCH: 32.1 pg (ref 26.0–34.0)
MCHC: 34.6 g/dL (ref 32.0–36.0)
MCHC: 35.7 g/dL (ref 32.0–36.0)
MCV: 89.9 fL (ref 80.0–100.0)
MCV: 89.9 fL (ref 80.0–100.0)
MONO ABS: 0.9 10*3/uL (ref 0.2–1.0)
MONO ABS: 0.5 10*3/uL (ref 0.2–1.0)
Monocytes Relative: 7 %
Monocytes Relative: 5 %
Neutro Abs: 7.1 10*3/uL — ABNORMAL HIGH (ref 1.4–6.5)
Neutro Abs: 7 10*3/uL — ABNORMAL HIGH (ref 1.4–6.5)
Neutrophils Relative %: 56 %
Neutrophils Relative %: 77 %
PLATELETS: 227 10*3/uL (ref 150–440)
PLATELETS: 190 10*3/uL (ref 150–440)
RBC: 5.24 MIL/uL (ref 4.40–5.90)
RBC: 4.47 MIL/uL (ref 4.40–5.90)
RDW: 14.8 % — ABNORMAL HIGH (ref 11.5–14.5)
RDW: 14.6 % — AB (ref 11.5–14.5)
WBC: 12.6 10*3/uL — AB (ref 3.8–10.6)
WBC: 9.1 10*3/uL (ref 3.8–10.6)

## 2018-01-08 LAB — URINE DRUG SCREEN, QUALITATIVE (ARMC ONLY)
AMPHETAMINES, UR SCREEN: NOT DETECTED
Benzodiazepine, Ur Scrn: NOT DETECTED
COCAINE METABOLITE, UR ~~LOC~~: POSITIVE — AB
Cannabinoid 50 Ng, Ur ~~LOC~~: NOT DETECTED
MDMA (Ecstasy)Ur Screen: NOT DETECTED
METHADONE SCREEN, URINE: NOT DETECTED
OPIATE, UR SCREEN: NOT DETECTED
Phencyclidine (PCP) Ur S: NOT DETECTED
Tricyclic, Ur Screen: NOT DETECTED

## 2018-01-08 LAB — COMPREHENSIVE METABOLIC PANEL
ALBUMIN: 3.2 g/dL — AB (ref 3.5–5.0)
ALK PHOS: 55 U/L (ref 38–126)
ALT: 45 U/L (ref 17–63)
ALT: 35 U/L (ref 17–63)
ANION GAP: 9 (ref 5–15)
AST: 55 U/L — AB (ref 15–41)
AST: 40 U/L (ref 15–41)
Albumin: 3.9 g/dL (ref 3.5–5.0)
Alkaline Phosphatase: 45 U/L (ref 38–126)
Anion gap: 11 (ref 5–15)
BILIRUBIN TOTAL: 0.8 mg/dL (ref 0.3–1.2)
BUN: 17 mg/dL (ref 6–20)
BUN: 18 mg/dL (ref 6–20)
CALCIUM: 9 mg/dL (ref 8.9–10.3)
CHLORIDE: 103 mmol/L (ref 101–111)
CO2: 22 mmol/L (ref 22–32)
CO2: 21 mmol/L — ABNORMAL LOW (ref 22–32)
CREATININE: 0.88 mg/dL (ref 0.61–1.24)
Calcium: 7.6 mg/dL — ABNORMAL LOW (ref 8.9–10.3)
Chloride: 99 mmol/L — ABNORMAL LOW (ref 101–111)
Creatinine, Ser: 0.96 mg/dL (ref 0.61–1.24)
GFR calc Af Amer: >60 mL/min (ref >60)
GFR calc non Af Amer: >60 mL/min (ref >60)
GLUCOSE: 123 mg/dL — AB (ref 65–99)
Glucose, Bld: 112 mg/dL — ABNORMAL HIGH (ref 65–99)
POTASSIUM: 3.8 mmol/L (ref 3.5–5.1)
Potassium: 4.3 mmol/L (ref 3.5–5.1)
SODIUM: 133 mmol/L — AB (ref 135–145)
Sodium: 132 mmol/L — ABNORMAL LOW (ref 135–145)
TOTAL PROTEIN: 8.9 g/dL — AB (ref 6.5–8.1)
Total Bilirubin: 0.5 mg/dL (ref 0.3–1.2)
Total Protein: 7 g/dL (ref 6.5–8.1)

## 2018-01-08 LAB — URINALYSIS, COMPLETE (UACMP) WITH MICROSCOPIC
Bacteria, UA: NONE SEEN
Bilirubin Urine: NEGATIVE
Glucose, UA: NEGATIVE mg/dL
Hgb urine dipstick: NEGATIVE
KETONES UR: NEGATIVE mg/dL
Leukocytes, UA: NEGATIVE
Nitrite: NEGATIVE
PH: 5 (ref 5.0–8.0)
PROTEIN: NEGATIVE mg/dL
SQUAMOUS EPITHELIAL / LPF: NONE SEEN (ref 0–5)
Specific Gravity, Urine: 1.023 (ref 1.005–1.030)

## 2018-01-08 LAB — PROTEIN AND GLUCOSE, CSF
Glucose, CSF: 57 mg/dL (ref 40–70)
Total  Protein, CSF: 48 mg/dL — ABNORMAL HIGH (ref 15–45)

## 2018-01-08 LAB — PROTIME-INR
INR: 1.04
INR: 1.18
PROTHROMBIN TIME: 13.5 s (ref 11.4–15.2)
Prothrombin Time: 14.9 seconds (ref 11.4–15.2)

## 2018-01-08 LAB — LACTIC ACID, PLASMA
Lactic Acid, Venous: 0.9 mmol/L (ref 0.5–1.9)
Lactic Acid, Venous: 0.7 mmol/L (ref 0.5–1.9)

## 2018-01-08 LAB — APTT: APTT: 33 s (ref 24–36)

## 2018-01-08 LAB — CK: CK TOTAL: 47 U/L — AB (ref 49–397)

## 2018-01-08 LAB — PROCALCITONIN: Procalcitonin: 0.33 ng/mL

## 2018-01-08 LAB — ETHANOL

## 2018-01-08 MED ORDER — SODIUM CHLORIDE 0.9 % IV SOLN
INTRAVENOUS | Status: AC
  Filled 2018-01-08 (×2): qty 10

## 2018-01-08 MED ORDER — LORAZEPAM 2 MG/ML IJ SOLN
2.0000 mg | Freq: Once | INTRAMUSCULAR | Status: AC
  Administered 2018-01-08: 2 mg via INTRAVENOUS
  Filled 2018-01-08: qty 1

## 2018-01-08 MED ORDER — ONDANSETRON HCL 4 MG/2ML IJ SOLN: 4.0000 mg | Freq: Four times a day (QID) | INTRAMUSCULAR | Status: DC | PRN

## 2018-01-08 MED ORDER — VANCOMYCIN HCL IN DEXTROSE 1-5 GM/200ML-% IV SOLN
1000.0000 mg | Freq: Once | INTRAVENOUS | Status: AC
  Administered 2018-01-08: 1000 mg via INTRAVENOUS
  Filled 2018-01-08: qty 200

## 2018-01-08 MED ORDER — VANCOMYCIN HCL 10 G IV SOLR
1250.0000 mg | Freq: Two times a day (BID) | INTRAVENOUS | Status: DC
  Administered 2018-01-09: 1250 mg via INTRAVENOUS
  Filled 2018-01-08 (×2): qty 1250

## 2018-01-08 MED ORDER — SODIUM CHLORIDE 0.9 % IV BOLUS
1000.0000 mL | Freq: Once | INTRAVENOUS | Status: AC
  Administered 2018-01-08: 1000 mL via INTRAVENOUS

## 2018-01-08 MED ORDER — DEXTROSE 5 % IV SOLN
770.0000 mg | Freq: Three times a day (TID) | INTRAVENOUS | Status: DC
  Administered 2018-01-09: 770 mg via INTRAVENOUS
  Filled 2018-01-08 (×3): qty 15.4

## 2018-01-08 MED ORDER — SODIUM CHLORIDE 0.9 % IV SOLN
INTRAVENOUS | Status: AC
  Administered 2018-01-08 – 2018-01-09 (×3): via INTRAVENOUS

## 2018-01-08 MED ORDER — SODIUM CHLORIDE 0.9 % IV SOLN
2.0000 g | Freq: Two times a day (BID) | INTRAVENOUS | Status: DC
  Administered 2018-01-09 – 2018-01-10 (×4): 2 g via INTRAVENOUS
  Filled 2018-01-08 (×5): qty 20
  Filled 2018-01-08: qty 2
  Filled 2018-01-08: qty 20

## 2018-01-08 MED ORDER — PHENOBARBITAL SODIUM 65 MG/ML IJ SOLN
130.0000 mg | Freq: Once | INTRAMUSCULAR | Status: AC
  Administered 2018-01-08: 130 mg via INTRAVENOUS

## 2018-01-08 MED ORDER — KETOROLAC TROMETHAMINE 30 MG/ML IJ SOLN
15.0000 mg | Freq: Once | INTRAMUSCULAR | Status: AC
  Administered 2018-01-08: 15 mg via INTRAVENOUS
  Filled 2018-01-08: qty 1

## 2018-01-08 MED ORDER — DEXTROSE 5 % IV SOLN
10.0000 mg/kg | Freq: Once | INTRAVENOUS | Status: AC
  Administered 2018-01-08: 770 mg via INTRAVENOUS
  Filled 2018-01-08: qty 15.4

## 2018-01-08 MED ORDER — SODIUM CHLORIDE 0.9 % IV SOLN
2.0000 g | Freq: Once | INTRAVENOUS | Status: AC
  Administered 2018-01-08: 2 g via INTRAVENOUS
  Filled 2018-01-08: qty 20

## 2018-01-08 MED ORDER — ACETAMINOPHEN 500 MG PO TABS
1000.0000 mg | ORAL_TABLET | Freq: Once | ORAL | Status: AC
  Administered 2018-01-08: 1000 mg via ORAL
  Filled 2018-01-08: qty 2

## 2018-01-08 MED ORDER — DEXAMETHASONE SODIUM PHOSPHATE 10 MG/ML IJ SOLN
10.0000 mg | Freq: Once | INTRAMUSCULAR | Status: AC
  Administered 2018-01-08: 10 mg via INTRAVENOUS
  Filled 2018-01-08: qty 1

## 2018-01-08 MED ORDER — SODIUM CHLORIDE 0.9 % IV BOLUS (SEPSIS): 1000.0000 mL | Freq: Once | INTRAVENOUS | Status: DC

## 2018-01-08 MED ORDER — ACETAMINOPHEN 325 MG PO TABS
650.0000 mg | ORAL_TABLET | Freq: Four times a day (QID) | ORAL | Status: DC | PRN
  Administered 2018-01-09 – 2018-01-10 (×2): 650 mg via ORAL
  Filled 2018-01-08 (×2): qty 2

## 2018-01-08 MED ORDER — VANCOMYCIN HCL 500 MG IV SOLR
500.0000 mg | Freq: Once | INTRAVENOUS | Status: AC
  Administered 2018-01-08: 500 mg via INTRAVENOUS
  Filled 2018-01-08: qty 500

## 2018-01-08 MED ORDER — DEXTROSE 5 % IV SOLN
800.0000 mg | Freq: Once | INTRAVENOUS | Status: DC
  Filled 2018-01-08: qty 16

## 2018-01-08 MED ORDER — AMPICILLIN SODIUM 2 G IJ SOLR
2.0000 g | INTRAMUSCULAR | Status: DC
  Administered 2018-01-08 – 2018-01-09 (×3): 2 g via INTRAVENOUS
  Filled 2018-01-08 (×2): qty 2000
  Filled 2018-01-08: qty 2
  Filled 2018-01-08 (×5): qty 2000

## 2018-01-08 NOTE — ED Notes (Signed)
Called and spoke with Pharmacy and they started they would pull it and bring to floor.

## 2018-01-08 NOTE — H&P (Signed)
[facility] [place] NAME: Luke Rogers    MR#:  [id number]  DATE OF BIRTH:  1960/08/25  DATE OF ADMISSION:  01/08/2018  PRIMARY CARE PHYSICIAN: Patient, No Pcp Per   REQUESTING/REFERRING PHYSICIAN: Rifenbark  CHIEF COMPLAINT:  Headache  HISTORY OF PRESENT ILLNESS:  Luke Rogers  is a 57 y.o. male with a known history of alcohol abuse, hepatitis C is presenting to the ED with a chief complaint of headache, neck pain associated with nausea and generalized malaise for 1 day.  Patient has long-standing history of alcohol abuse and his last drink was yesterday and admits to binging for the past 5 days.  Patient reports throbbing neck pain radiating to the back of the head at the time of arrival.  Temperature was 105 F, leukocytosis and tachycardic.  Sepsis protocol implemented and patient had LP done patient is started on broad-spectrum antibiotics for acute meningitis coverage including ceftriaxone, vancomycin, acyclovir.  Ampicillin is eventually added to the regimen.  Decadron was given in the ED.  No family members at bedside. Patient was answering questions appropriately during my examination though he was somewhat lethargic.  Denies any sick contacts  PAST MEDICAL HISTORY:   Past Medical History:  Diagnosis Date  . Alcohol abuse   . Hepatitis C   . Pancreatitis     PAST SURGICAL HISTOIRY:   Past Surgical History:  Procedure Laterality Date  . FOOT SURGERY Bilateral    As infant  . WRIST SURGERY Right     SOCIAL HISTORY:   Social History   Tobacco Use  . Smoking status: Current Every Day Smoker    Packs/day: 1.00    Types: Cigarettes  . Smokeless tobacco: Never Used  Substance Use Topics  . Alcohol use: Yes    FAMILY HISTORY:  History reviewed. No pertinent family history.  DRUG ALLERGIES:  No Known Allergies  REVIEW OF SYSTEMS:  CONSTITUTIONAL: Patient has high-grade fever and fatigue EYES: No  blurred or double vision.  Reporting headache, neck pain EARS, NOSE, AND THROAT: No tinnitus or ear pain.  RESPIRATORY: No cough, shortness of breath, wheezing or hemoptysis.  CARDIOVASCULAR: No chest pain, orthopnea, edema.  GASTROINTESTINAL: Patient has nausea, denies vomiting, diarrhea or abdominal pain.  GENITOURINARY: No dysuria, hematuria.  SKIN: No rash or lesion. MUSCULOSKELETAL: No joint pain or arthritis.   NEUROLOGIC: No tingling, numbness, weakness.  PSYCHIATRY: No anxiety or depression.   MEDICATIONS AT HOME:   Prior to Admission medications   Not on File      VITAL SIGNS:  Blood pressure 120/76, pulse (!) 105, temperature (!) 105 F (40.6 C), temperature source Rectal, resp. rate 18, height 6' 3"  (1.905 m), weight 77.1 kg (170 lb), SpO2 95 %.  PHYSICAL EXAMINATION:  GENERAL:  56 y.o.-year-old patient lying in the bed with no acute distress.  EYES: Pupils equal, round, reactive to light and accommodation. No scleral icterus. Extraocular muscles intact.  HEENT: Head atraumatic, normocephalic. Oropharynx and nasopharynx clear.  NECK:  Supple, no jugular venous distention. No thyroid enlargement, no tenderness.  LUNGS: Normal breath sounds bilaterally, no wheezing, rales,rhonchi or crepitation. No use of accessory muscles of respiration.  CARDIOVASCULAR: S1, S2 normal. No murmurs, rubs, or gallops.  ABDOMEN: Soft, nontender, nondistended. Bowel sounds present.  EXTREMITIES: No pedal edema, cyanosis, or clubbing.  NEUROLOGIC: Cranial nerves II through XII are intact.  Lethargic but arousable and answering questions appropriately, gait not checked.  PSYCHIATRIC: The patient is alert  and oriented x2- 3.  SKIN: No obvious rash, lesion, or ulcer.   LABORATORY PANEL:   CBC Recent Labs  Lab 01/08/18 1824  WBC 12.6*  HGB 16.3  HCT 47.0  PLT 227    ------------------------------------------------------------------------------------------------------------------  Chemistries  Recent Labs  Lab 01/08/18 1824  NA 132*  K 4.3  CL 99*  CO2 22  GLUCOSE 112*  BUN 17  CREATININE 0.88  CALCIUM 9.0  AST 55*  ALT 45  ALKPHOS 55  BILITOT 0.8   ------------------------------------------------------------------------------------------------------------------  Cardiac Enzymes No results for input(s): TROPONINI in the last 168 hours. ------------------------------------------------------------------------------------------------------------------  RADIOLOGY:  Ct Head Wo Contrast  Result Date: 01/08/2018 CLINICAL DATA:  Headache for 1 week. Blurred vision. Vomiting. History of alcohol abuse. EXAM: CT HEAD WITHOUT CONTRAST TECHNIQUE: Contiguous axial images were obtained from the base of the skull through the vertex without intravenous contrast. COMPARISON:  01/23/2010 FINDINGS: Brain: There is no evidence of acute infarct, intracranial hemorrhage, mass, midline shift, or significant extra-axial fluid collection. 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 The ventricles are normal in size. Vascular: No hyperdense vessel. Skull: No fracture or focal osseous lesion. Sinuses/Orbits: Visualized paranasal sinuses and mastoid air cells are clear. Orbits are unremarkable. Other: None. IMPRESSION: No acute or significant intracranial findings. Electronically Signed   By: Logan Bores M.D.   On: 01/08/2018 18:37    EKG:  No orders found for this or any previous visit.  IMPRESSION AND PLAN:     Luke Rogers  is a 57 y.o. male with a known history of alcohol abuse, hepatitis C is presenting to the ED with a chief complaint of headache, neck pain associated with nausea and generalized malaise for 1 day.  Patient has  long-standing history of alcohol abuse and his last drink was yesterday and admits to binging for the past 5 days.  Patient reports throbbing neck pain radiating to the back of the head at the time of arrival.  Temperature was 105 F, leukocytosis and tachycardic.  Sepsis protocol implemented and patient had LP done patient is started on broad-spectrum antibiotics for acute meningitis   #Acute meningitis-likely bacterial  admit to MedSurg unit  strict droplet precautions LP was done in the ED and as reported by the ED physician the CSF looks purulent Empiric IV antibiotics for meningitis including ceftriaxone, vancomycin and acyclovir and ampicillin.  Decadron one-time dose was given in the ED ID consult placed IV fluids  #Sepsis Patient met septic criteria at the time of admission with fever, tachycardia, leukocytosis Check lactic acid and procalcitonin Spectrum IV antibiotics, IV fluids Cooling blanket  #History of hepatitis C Outpatient follow-up with GI  #hyponatremia-could be from dehydration versus alcohol related Hydrate with IV fluids  #Alcohol abuse ciwa    GI prophylaxis and DVT prophylaxis   All the records are reviewed and case discussed with ED provider. Management plans discussed with the patient,mom Vaughan Basta family and they are in agreement.  CODE STATUS: fc  TOTAL TIME TAKING CARE OF THIS PATIENT: 42 minutes.   Note: This dictation was prepared with Dragon dictation along with smaller phrase technology. Any transcriptional errors that result from this process are unintentional.  Nicholes Mango M.D on 01/08/2018 at 8:39 PM  Between 7am to 6pm - Pager - [phone]  After 6pm go to [email or web address] - password [email or web address] [facility]  [facility]  Office  [phone]  CC: Primary  care physician; Patient, No Pcp Per

## 2018-01-08 NOTE — Progress Notes (Addendum)
Pharmacy Antibiotic Note  Luke Rogers. is a 57 y.o. male admitted on 01/08/2018 with meningitis.  Pharmacy has been consulted for ampicillin/vanomycin dosing.  Plan: Vancomycin 1500mg  iv x 1 followed by  Vancomycin 1250mg  IV every 12 hours.  Goal trough 15-20 mcg/mL. Ampicillin 2gm iv q4h   Height: 6\' 3"  (190.5 cm) Weight: 170 lb (77.1 kg) IBW/kg (Calculated) : 84.5  Temp (24hrs), Avg:102.4 F (39.1 C), Min:99.8 F (37.7 C), Max:105 F (40.6 C)  Recent Labs  Lab 01/08/18 1824  WBC 12.6*  CREATININE 0.88    Estimated Creatinine Clearance: 104.6 mL/min (by C-G formula based on SCr of 0.88 mg/dL).    No Known Allergies  Antimicrobials this admission: Anti-infectives (From admission, onward)   Start     Dose/Rate Route Frequency Ordered Stop   01/09/18 0700  cefTRIAXone (ROCEPHIN) 2 g in sodium chloride 0.9 % 100 mL IVPB     2 g 200 mL/hr over 30 Minutes Intravenous Every 12 hours 01/08/18 1932     01/09/18 0500  vancomycin (VANCOCIN) 1,250 mg in sodium chloride 0.9 % 250 mL IVPB     1,250 mg 166.7 mL/hr over 90 Minutes Intravenous Every 12 hours 01/08/18 1932     01/09/18 0400  acyclovir (ZOVIRAX) 770 mg in dextrose 5 % 150 mL IVPB     770 mg 165.4 mL/hr over 60 Minutes Intravenous Every 8 hours 01/08/18 1932     01/08/18 1930  acyclovir (ZOVIRAX) 770 mg in dextrose 5 % 150 mL IVPB     10 mg/kg  77.1 kg 165.4 mL/hr over 60 Minutes Intravenous  Once 01/08/18 1925     01/08/18 1930  vancomycin (VANCOCIN) 500 mg in sodium chloride 0.9 % 100 mL IVPB     500 mg 100 mL/hr over 60 Minutes Intravenous  Once 01/08/18 1928     01/08/18 1915  vancomycin (VANCOCIN) IVPB 1000 mg/200 mL premix     1,000 mg 200 mL/hr over 60 Minutes Intravenous  Once 01/08/18 1911     01/08/18 1915  acyclovir (ZOVIRAX) 800 mg in dextrose 5 % 150 mL IVPB  Status:  Discontinued     800 mg 166 mL/hr over 60 Minutes Intravenous  Once 01/08/18 1911 01/08/18 1925   01/08/18 1900  cefTRIAXone  (ROCEPHIN) 2 g in sodium chloride 0.9 % 100 mL IVPB     2 g 200 mL/hr over 30 Minutes Intravenous  Once 01/08/18 1901 01/08/18 1954   01/08/18 1853  sodium chloride 0.9 % with cefTRIAXone (ROCEPHIN) ADS Med    Note to Pharmacy:  [name]  : cabinet override      01/08/18 1853 01/09/18 0714       Microbiology results: No results found for this or any previous visit (from the past 240 hour(s)).  Thank you for allowing pharmacy to be a part of this patient's care.  [name] 01/08/2018 7:54 PM

## 2018-01-08 NOTE — ED Provider Notes (Signed)
[facility] Emergency Department Provider Note  ____________________________________________   First MD Initiated Contact with Patient 01/08/18 1805     (approximate)  I have reviewed the triage vital signs and the nursing notes.   HISTORY  Chief Complaint Headache  Level 5 exemption history limited by the patient's clinical condition  HPI Luke Rogers. is a 57 y.o. male who comes to the emergency department with headache, neck pain, nausea, and generalized malaise for the past day or so.  He does have a long-standing history of alcohol abuse although says he has been sober until 5 days ago.  He binged for the last 5 days and his last drink was yesterday.  He does report throbbing aching neck discomfort along with headache.  He is clearly somewhat confused on arrival and further history is challenging to obtain.  Past Medical History:  Diagnosis Date  . Alcohol abuse   . Hepatitis C   . Pancreatitis     Patient Active Problem List   Diagnosis Date Noted  . Acute purulent meningitis 01/08/2018  . Alcohol dependence with uncomplicated withdrawal ([place]) 02/16/2015    Past Surgical History:  Procedure Laterality Date  . FOOT SURGERY Bilateral    As infant  . WRIST SURGERY Right     Prior to Admission medications   Not on File    Allergies Patient has no known allergies.  History reviewed. No pertinent family history.  Social History Social History   Tobacco Use  . Smoking status: Current Every Day Smoker    Packs/day: 1.00    Types: Cigarettes  . Smokeless tobacco: Never Used  Substance Use Topics  . Alcohol use: Yes  . Drug use: No    Review of Systems Level 5 exemption history limited by the patient's clinical condition ____________________________________________   PHYSICAL EXAM:  VITAL SIGNS: ED Triage Vitals [01/08/18 1805]  Enc Vitals Group     BP (!) 146/87     Pulse Rate (!) 120     Resp 18     Temp 99.8  F (37.7 C)     Temp Source Oral     SpO2 94 %     Weight 170 lb (77.1 kg)     Height 6\' 3"  (1.905 m)     Head Circumference      Peak Flow      Pain Score 4     Pain Loc      Pain Edu?      Excl. in [place]?     Constitutional: Appears critically ill diaphoretic and confused Eyes: PERRL EOMI. midrange and brisk Head: Atraumatic. Nose: No congestion/rhinnorhea. Mouth/Throat: No trismus very mild tongue fasciculations Neck: Able to range his neck with no frank meningismus although his neck does seem somewhat stiff Cardiovascular: Tachycardic rate, regular rhythm. Grossly normal heart sounds.  Good peripheral circulation. Respiratory: Normal respiratory effort.  No retractions. Lungs CTAB and moving good air Gastrointestinal: Soft nontender Musculoskeletal: No lower extremity edema   Neurologic:   No gross focal neurologic deficits are appreciated. Skin: Diaphoretic Psychiatric: Somewhat confused    ____________________________________________   DIFFERENTIAL includes but not limited to  Alcohol withdrawal, delirium tremens, intracerebral hemorrhage, sepsis, meningitis, meningoencephalitis ____________________________________________   LABS (all labs ordered are listed, but only abnormal results are displayed)  Labs Reviewed  COMPREHENSIVE METABOLIC PANEL - Abnormal; Notable for the following components:      Result Value   Sodium 132 (*)    Chloride 99 (*)  Glucose, Bld 112 (*)    Total Protein 8.9 (*)    AST 55 (*)    All other components within normal limits  CBC WITH DIFFERENTIAL/PLATELET - Abnormal; Notable for the following components:   WBC 12.6 (*)    RDW 14.8 (*)    Neutro Abs 7.1 (*)    Lymphs Abs 4.5 (*)    All other components within normal limits  CK - Abnormal; Notable for the following components:   Total CK 47 (*)    All other components within normal limits  CSF CULTURE  GRAM STAIN  CULTURE, BLOOD (ROUTINE X 2)  CULTURE, BLOOD (ROUTINE X 2)    ETHANOL  PROTIME-INR  LACTIC ACID, PLASMA  URINALYSIS, COMPLETE (UACMP) WITH MICROSCOPIC  CSF CELL COUNT WITH DIFFERENTIAL  CSF CELL COUNT WITH DIFFERENTIAL  PROTEIN AND GLUCOSE, CSF  LACTIC ACID, PLASMA  URINE DRUG SCREEN, QUALITATIVE ([facility] ONLY)  HERPES SIMPLEX VIRUS(HSV) DNA BY PCR    Lab work reviewed by me with elevated white count which is nonspecific but is concerning for infection __________________________________________  EKG   ____________________________________________  RADIOLOGY  CT of the head reviewed by me with no acute disease ____________________________________________   PROCEDURES  Procedure(s) performed: Yes  .Critical Care Performed by: Darel Hong, MD Authorized by: Darel Hong, MD   Critical care provider statement:    Critical care time (minutes):  40   Critical care time was exclusive of:  Separately billable procedures and treating other patients   Critical care was necessary to treat or prevent imminent or life-threatening deterioration of the following conditions:  Sepsis   Critical care was time spent personally by me on the following activities:  Development of treatment plan with patient or surrogate, discussions with consultants, evaluation of patient's response to treatment, examination of patient, obtaining history from patient or surrogate, ordering and performing treatments and interventions, ordering and review of laboratory studies, ordering and review of radiographic studies, pulse oximetry, re-evaluation of patient's condition and review of old charts .Lumbar Puncture Date/Time: 01/08/2018 8:31 PM Performed by: Darel Hong, MD Authorized by: Darel Hong, MD   Consent:    Consent obtained:  Verbal   Consent given by:  Patient   Risks discussed:  Headache, bleeding, infection, pain and nerve damage Pre-procedure details:    Procedure purpose:  Diagnostic   Preparation: Patient was prepped and draped in usual  sterile fashion   Anesthesia (see MAR for exact dosages):    Anesthesia method:  Local infiltration   Local anesthetic:  Lidocaine 1% w/o epi Procedure details:    Lumbar space:  L3-L4 interspace   Patient position:  R lateral decubitus   Needle gauge:  20   Needle type:  Spinal needle - Quincke tip   Needle length (in):  3.5   Number of attempts:  3   Opening pressure (cm H2O):  10   Fluid appearance:  Cloudy   Tubes of fluid:  4   Total volume (ml):  6 Post-procedure:    Puncture site:  Adhesive bandage applied and direct pressure applied   Patient tolerance of procedure:  Tolerated well, no immediate complications    Critical Care performed: Yes  Observation: no ____________________________________________   INITIAL IMPRESSION / ASSESSMENT AND PLAN / ED COURSE  Pertinent labs & imaging results that were available during my care of the patient were reviewed by me and considered in my medical decision making (see chart for details).  On arrival the patient is 99.8 degrees  oral and appears uncomfortable.  Mild diaphoresis tachycardic and tremulous.  He does have a long-standing history of alcohol abuse although he says he has been sober until the past 5 days.  Unclear if this represents alcohol withdrawal or an infectious etiology but I explained to the patient if he is only been drinking for 5 days he should not actually withdrawal.  I am concerned therefore for possible infectious etiology and with a headache neck pain and fever along with some confusion I have to be concerned about meningitis.  He consents to lumbar puncture.  When I positioned the patient for lumbar puncture he felt very warm so we performed a rectal temperature and he was 105 degrees.  Given my high clinical suspicion for meningitis during the procedure I added on 2 g of ceftriaxone.  Typically 3 attempts to obtain CSF and when it came out it appeared slightly cloudy raising concern for infection.  Acyclovir  and vancomycin added on along with blood cultures and a lactic acid and the patient will require inpatient admission.  On further questioning the patient does say that his niece was recently hospitalized for meningitis.  Care signed over to the hospitalist who has graciously agreed to admit the patient to her service.      ____________________________________________   FINAL CLINICAL IMPRESSION(S) / ED DIAGNOSES  Final diagnoses:  Sepsis, due to unspecified organism [facility])      NEW MEDICATIONS STARTED DURING THIS VISIT:  New Prescriptions   No medications on file     Note:  This document was prepared using Dragon voice recognition software and may include unintentional dictation errors.     Darel Hong, MD 01/08/18 2040

## 2018-01-08 NOTE — Progress Notes (Signed)
Family Meeting Note  Advance Directive:yes  Today a meeting took place with the Patient.    The following clinical team members were present during this meeting:MD  The following were discussed:Patient's diagnosis: Sepsis, acute meningitis, hyponatremia, alcohol abuse, history of hepatitis C, treatment plan of care discussed in detail with the patient and his mom at bedside Mom verbalized understanding of the plan   patient's progosis: Unable to determine and Goals for treatment: Full Code Mom is a healthcare power of attorney Additional follow-up to be provided: Hospitalist, infectious disease  Time spent during discussion:80m in  Luke Mango, MD

## 2018-01-08 NOTE — ED Notes (Signed)
Ice packs applied

## 2018-01-08 NOTE — Progress Notes (Signed)
CODE SEPSIS - PHARMACY COMMUNICATION  **Broad Spectrum Antibiotics should be administered within 1 hour of Sepsis diagnosis**  Time Code Sepsis Called/Page Received: 2034  Antibiotics Ordered: 1920  Time of 1st antibiotic administration:  Antibiotics Given (last 72 hours)     Date/Time Action Medication Dose Rate   01/08/18 1924 New Bag/Given   cefTRIAXone (ROCEPHIN) 2 g in sodium chloride 0.9 % 100 mL IVPB 2 g 200 mL/hr   01/08/18 2009 New Bag/Given   acyclovir (ZOVIRAX) 770 mg in dextrose 5 % 150 mL IVPB 770 mg 165.4 mL/hr   01/08/18 2109 New Bag/Given   vancomycin (VANCOCIN) 500 mg in sodium chloride 0.9 % 100 mL IVPB 500 mg 100 mL/hr        Additional action taken by pharmacy: None   If necessary, Name of Provider/Nurse Contacted: none   Thomasenia Sales, PharmD, MBA, BCGP Clinical Pharmacist 01/08/2018  9:12 PM

## 2018-01-08 NOTE — ED Notes (Signed)
Attempted to call Pharmacy to send up medications. No answer at this time. Will continue to try.

## 2018-01-08 NOTE — ED Notes (Signed)
Patient transported to CT 

## 2018-01-08 NOTE — ED Triage Notes (Signed)
Pt arrives with c/o of HA x a week. Pt states "some" blurred vision. States vomiting this morning, then states it was only dry heaves. Denies hx of headaches. Here with his parents. Speaking in clear complete sentences. Pt also c/o of stomach pain. Hx alcohol abuse and pancreatitis.

## 2018-01-09 ENCOUNTER — Inpatient Hospital Stay: Payer: Self-pay

## 2018-01-09 DIAGNOSIS — E43 Unspecified severe protein-calorie malnutrition: Secondary | ICD-10-CM | POA: Diagnosis present

## 2018-01-09 DIAGNOSIS — E440 Moderate protein-calorie malnutrition: Secondary | ICD-10-CM

## 2018-01-09 LAB — CBC WITH DIFFERENTIAL/PLATELET
BASOS ABS: 0 10*3/uL (ref 0–0.1)
BASOS PCT: 0 %
EOS PCT: 0 %
Eosinophils Absolute: 0 10*3/uL (ref 0–0.7)
HCT: 41.5 % (ref 40.0–52.0)
Hemoglobin: 14.6 g/dL (ref 13.0–18.0)
LYMPHS PCT: 25 %
Lymphs Abs: 2 10*3/uL (ref 1.0–3.6)
MCH: 31.9 pg (ref 26.0–34.0)
MCHC: 35.2 g/dL (ref 32.0–36.0)
MCV: 90.6 fL (ref 80.0–100.0)
MONO ABS: 0.3 10*3/uL (ref 0.2–1.0)
Monocytes Relative: 4 %
NEUTROS ABS: 5.5 10*3/uL (ref 1.4–6.5)
Neutrophils Relative %: 71 %
PLATELETS: 177 10*3/uL (ref 150–440)
RBC: 4.59 MIL/uL (ref 4.40–5.90)
RDW: 14.8 % — AB (ref 11.5–14.5)
WBC: 7.9 10*3/uL (ref 3.8–10.6)

## 2018-01-09 LAB — BASIC METABOLIC PANEL
ANION GAP: 8 (ref 5–15)
BUN: 17 mg/dL (ref 6–20)
CALCIUM: 7.5 mg/dL — AB (ref 8.9–10.3)
CO2: 21 mmol/L — ABNORMAL LOW (ref 22–32)
Chloride: 104 mmol/L (ref 101–111)
Creatinine, Ser: 0.86 mg/dL (ref 0.61–1.24)
GFR calc non Af Amer: >60 mL/min (ref >60)
Glucose, Bld: 146 mg/dL — ABNORMAL HIGH (ref 65–99)
POTASSIUM: 3.9 mmol/L (ref 3.5–5.1)
Sodium: 133 mmol/L — ABNORMAL LOW (ref 135–145)

## 2018-01-09 LAB — PATHOLOGIST SMEAR REVIEW

## 2018-01-09 LAB — LACTIC ACID, PLASMA: LACTIC ACID, VENOUS: 1 mmol/L (ref 0.5–1.9)

## 2018-01-09 LAB — STREP PNEUMONIAE URINARY ANTIGEN: STREP PNEUMO URINARY ANTIGEN: NEGATIVE

## 2018-01-09 MED ORDER — ENSURE ENLIVE PO LIQD
237.0000 mL | Freq: Two times a day (BID) | ORAL | Status: DC
  Administered 2018-01-09 – 2018-01-11 (×3): 237 mL via ORAL

## 2018-01-09 MED ORDER — SODIUM CHLORIDE 0.9 % IV SOLN
500.0000 mg | INTRAVENOUS | Status: DC
  Administered 2018-01-09: 500 mg via INTRAVENOUS
  Filled 2018-01-09 (×2): qty 500

## 2018-01-09 MED ORDER — GUAIFENESIN-DM 100-10 MG/5ML PO SYRP
5.0000 mL | ORAL_SOLUTION | ORAL | Status: DC | PRN
  Administered 2018-01-09 – 2018-01-11 (×3): 5 mL via ORAL
  Filled 2018-01-09 (×3): qty 5

## 2018-01-09 MED ORDER — GUAIFENESIN ER 600 MG PO TB12
600.0000 mg | ORAL_TABLET | Freq: Two times a day (BID) | ORAL | Status: DC
  Administered 2018-01-09 – 2018-01-10 (×3): 600 mg via ORAL
  Filled 2018-01-09 (×7): qty 1

## 2018-01-09 MED ORDER — ADULT MULTIVITAMIN W/MINERALS CH
1.0000 | ORAL_TABLET | Freq: Every day | ORAL | Status: DC
  Administered 2018-01-09: 1 via ORAL
  Filled 2018-01-09 (×2): qty 1

## 2018-01-09 NOTE — Progress Notes (Signed)
[facility] - [place] [facility]                                                                                                                                                                                  Patient Demographics   Luke Rogers, is a 56 y.o. male, DOB - 03/06/1961, MRN:[id number]  Admit date - 01/08/2018   Admitting Physician Aruna Gouru, MD  Outpatient Primary MD for the patient is Patient, No Pcp Per   LOS - 1  Subjective: Patient admitted with headache high-grade fever of 105 feeling better    Review of Systems:   CONSTITUTIONAL: Positive fever. No fatigue, weakness. No weight gain, no weight loss.  EYES: No blurry or double vision.  ENT: No tinnitus. No postnasal drip. No redness of the oropharynx.  RESPIRATORY: No cough, no wheeze, no hemoptysis. No dyspnea.  CARDIOVASCULAR: No chest pain. No orthopnea. No palpitations. No syncope.  GASTROINTESTINAL: No nausea, no vomiting or diarrhea. No abdominal pain. No melena or hematochezia.  GENITOURINARY: No dysuria or hematuria.  ENDOCRINE: No polyuria or nocturia. No heat or cold intolerance.  HEMATOLOGY: No anemia. No bruising. No bleeding.  INTEGUMENTARY: No rashes. No lesions.  MUSCULOSKELETAL: No arthritis. No swelling. No gout.  NEUROLOGIC: No numbness, tingling, or ataxia. No seizure-type activity.  Positive headache PSYCHIATRIC: No anxiety. No insomnia. No ADD.    Vitals:   Vitals:   01/09/18 0500 01/09/18 0600 01/09/18 0608 01/09/18 1159  BP: (!) 78/57 (!) 88/58 (!) 85/58 101/66  Pulse: (!) 54 (!) 57 (!) 59 77  Resp:   18 16  Temp:   (!) 97.5 F (36.4 C) 98.1 F (36.7 C)  TempSrc:   Oral Oral  SpO2:   96% 94%  Weight:      Height:        Wt Readings from Last 3 Encounters:  01/08/18 77.9 kg (171 lb 11.8 oz)     Intake/Output Summary (Last 24 hours) at 01/09/2018 1303 Last data filed at 01/09/2018 1246 Gross per 24 hour  Intake 2004.17 ml  Output 1100 ml  Net 904.17  ml    Physical Exam:   GENERAL: Pleasant-appearing in no apparent distress.  HEAD, EYES, EARS, NOSE AND THROAT: Atraumatic, normocephalic. Extraocular muscles are intact. Pupils equal and reactive to light. Sclerae anicteric. No conjunctival injection. No oro-pharyngeal erythema.  NECK: Supple. There is no jugular venous distention. No bruits, no lymphadenopathy, no thyromegaly.  HEART: Regular rate and rhythm,. No murmurs, no rubs, no clicks.  LUNGS: Clear to auscultation bilaterally. No rales or rhonchi. No wheezes.  ABDOMEN: Soft, flat, nontender, nondistended. Has good bowel sounds. No hepatosplenomegaly appreciated.  EXTREMITIES: No   evidence of any cyanosis, clubbing, or peripheral edema.  +2 pedal and radial pulses bilaterally.  NEUROLOGIC: The patient is alert, awake, and oriented x3 with no focal motor or sensory deficits appreciated bilaterally.  SKIN: Moist and warm with no rashes appreciated.  Psych: Not anxious, depressed LN: No inguinal LN enlargement    Antibiotics   Anti-infectives (From admission, onward)   Start     Dose/Rate Route Frequency Ordered Stop   01/09/18 0700  cefTRIAXone (ROCEPHIN) 2 g in sodium chloride 0.9 % 100 mL IVPB     2 g 200 mL/hr over 30 Minutes Intravenous Every 12 hours 01/08/18 1932     01/09/18 0500  vancomycin (VANCOCIN) 1,250 mg in sodium chloride 0.9 % 250 mL IVPB  Status:  Discontinued     1,250 mg 166.7 mL/hr over 90 Minutes Intravenous Every 12 hours 01/08/18 1932 01/09/18 0907   01/09/18 0400  acyclovir (ZOVIRAX) 770 mg in dextrose 5 % 150 mL IVPB  Status:  Discontinued     770 mg 165.4 mL/hr over 60 Minutes Intravenous Every 8 hours 01/08/18 1932 01/09/18 0907   01/08/18 2045  ampicillin (OMNIPEN) 2 g in sodium chloride 0.9 % 100 mL IVPB  Status:  Discontinued     2 g 300 mL/hr over 20 Minutes Intravenous Every 4 hours 01/08/18 2031 01/09/18 0907   01/08/18 1930  acyclovir (ZOVIRAX) 770 mg in dextrose 5 % 150 mL IVPB     10 mg/kg   77.1 kg 165.4 mL/hr over 60 Minutes Intravenous  Once 01/08/18 1925 01/08/18 2109   01/08/18 1930  vancomycin (VANCOCIN) 500 mg in sodium chloride 0.9 % 100 mL IVPB     500 mg 100 mL/hr over 60 Minutes Intravenous  Once 01/08/18 1928 01/08/18 2209   01/08/18 1915  vancomycin (VANCOCIN) IVPB 1000 mg/200 mL premix     1,000 mg 200 mL/hr over 60 Minutes Intravenous  Once 01/08/18 1911 01/09/18 0032   01/08/18 1915  acyclovir (ZOVIRAX) 800 mg in dextrose 5 % 150 mL IVPB  Status:  Discontinued     800 mg 166 mL/hr over 60 Minutes Intravenous  Once 01/08/18 1911 01/08/18 1925   01/08/18 1900  cefTRIAXone (ROCEPHIN) 2 g in sodium chloride 0.9 % 100 mL IVPB     2 g 200 mL/hr over 30 Minutes Intravenous  Once 01/08/18 1901 01/08/18 2000   01/08/18 1853  sodium chloride 0.9 % with cefTRIAXone (ROCEPHIN) ADS Med    Note to Pharmacy:  Hamilton, Samantha  : cabinet override      01/08/18 1853 01/09/18 0714      Medications   Scheduled Meds: . guaiFENesin  600 mg Oral BID   Continuous Infusions: . sodium chloride 125 mL/hr at 01/09/18 0822  . cefTRIAXone (ROCEPHIN)  IV 2 g (01/09/18 0606)  . sodium chloride     PRN Meds:.acetaminophen, guaiFENesin-dextromethorphan, ondansetron (ZOFRAN) IV   Data Review:   Micro Results Recent Results (from the past 240 hour(s))  CSF culture     Status: None (Preliminary result)   Collection Time: 01/08/18  6:19 PM  Result Value Ref Range Status   Specimen Description CSF  Final   Special Requests NONE  Final   Gram Stain   Final    NO ORGANISMS SEEN MODERATE WBC SEEN MANY RED BLOOD CELLS Performed at [facility], [address] [place], [place] [address]    Culture PENDING  Incomplete   Report Status PENDING  Incomplete  CULTURE, BLOOD (ROUTINE X   2) w Reflex to ID Panel     Status: None (Preliminary result)   Collection Time: 01/08/18  7:07 PM  Result Value Ref Range Status   Specimen Description BLOOD LEFT ANTECUBITAL  Final    Special Requests Blood Culture adequate volume  Final   Culture   Final    NO GROWTH < 12 HOURS Performed at [facility], [address] [place], [place] [address]    Report Status PENDING  Incomplete  CULTURE, BLOOD (ROUTINE X 2) w Reflex to ID Panel     Status: None (Preliminary result)   Collection Time: 01/08/18  7:10 PM  Result Value Ref Range Status   Specimen Description BLOOD RIGHT ANTECUBITAL  Final   Special Requests Blood Culture adequate volume  Final   Culture   Final    NO GROWTH < 12 HOURS Performed at [facility], [address], [place], [place] [address]    Report Status PENDING  Incomplete    Radiology Reports Ct Head Wo Contrast  Result Date: 01/08/2018 CLINICAL DATA:  Headache for 1 week. Blurred vision. Vomiting. History of alcohol abuse. EXAM: CT HEAD WITHOUT CONTRAST TECHNIQUE: Contiguous axial images were obtained from the base of the skull through the vertex without intravenous contrast. COMPARISON:  01/23/2010 FINDINGS: Brain: There is no evidence of acute infarct, intracranial hemorrhage, mass, midline shift, or significant extra-axial fluid collection. 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 The ventricles are normal in size. Vascular: No hyperdense vessel. Skull: No fracture or focal osseous lesion. Sinuses/Orbits: Visualized paranasal sinuses and mastoid air cells are clear. Orbits are unremarkable. Other: None. IMPRESSION: No acute or significant intracranial findings. Electronically Signed   By: Logan Bores M.D.   On: 01/08/2018 18:37     CBC Recent Labs  Lab 01/08/18 1824 01/08/18 2242 01/09/18 0204  WBC 12.6* 9.1 7.9  HGB 16.3 14.3 14.6  HCT 47.0 40.1 41.5  PLT 227 190 177  MCV 89.9 89.9 90.6  MCH 31.1 32.1 31.9  MCHC 34.6 35.7 35.2  RDW 14.8* 14.6* 14.8*  LYMPHSABS 4.5* 1.6 2.0  MONOABS 0.9 0.5  0.3  EOSABS 0.0 0.0 0.0  BASOSABS 0.1 0.1 0.0    Chemistries  Recent Labs  Lab 01/08/18 1824 01/08/18 2242 01/09/18 0204  NA 132* 133* 133*  K 4.3 3.8 3.9  CL 99* 103 104  CO2 22 21* 21*  GLUCOSE 112* 123* 146*  BUN _0 CREATININE 0.88 0.96 0.86  CALCIUM 9.0 7.6* 7.5*  AST 55* 40  --   ALT 45 35  --   ALKPHOS 55 45  --   BILITOT 0.8 0.5  --    ------------------------------------------------------------------------------------------------------------------ estimated creatinine clearance is 108.2 mL/min (by C-G formula based on SCr of 0.86 mg/dL). ------------------------------------------------------------------------------------------------------------------ No results for input(s): HGBA1C in the last 72 hours. ------------------------------------------------------------------------------------------------------------------ No results for input(s): CHOL, HDL, LDLCALC, TRIG, CHOLHDL, LDLDIRECT in the last 72 hours. ------------------------------------------------------------------------------------------------------------------ No results for input(s): TSH, T4TOTAL, T3FREE, THYROIDAB in the last 72 hours.  Invalid input(s): FREET3 ------------------------------------------------------------------------------------------------------------------ No results for input(s): VITAMINB12, FOLATE, FERRITIN, TIBC, IRON, RETICCTPCT in the last 72 hours.  Coagulation profile Recent Labs  Lab 01/08/18 1824 01/08/18 2242  INR 1.04 1.18    No results for input(s): DDIMER in the last 72 hours.  Cardiac Enzymes No results for input(s): CKMB, TROPONINI, MYOGLOBIN in the last 168 hours.  Invalid  input(s): CK ------------------------------------------------------------------------------------------------------------------ Invalid input(s): POCBNP    Assessment & Plan   Luke Rogers  is a 56 y.o. male with a known history of alcohol abuse, hepatitis C is presenting to the  ED with a chief complaint of headache, neck pain associated with nausea and generalized malaise for 1 day.  Patient has long-standing history of alcohol abuse and his last drink was yesterday and admits to binging for the past 5 days.  Patient reports throbbing neck pain radiating to the back of the head at the time of arrival.  Temperature was 105 F, leukocytosis and tachycardic.  Sepsis protocol implemented and patient had LP done patient is started on broad-spectrum antibiotics for acute meningitis   #Acute meningitis-based on patient's CSF evaluation Which shows, less than 100 WBCs lymphocyte predominant consistent with viral meningitis I will discontinue all antibiotics except ceftriaxone I will ask ID to see  #Sepsis Patient met septic criteria at the time of admission with fever, tachycardia, leukocytosis IV fluids Blood cultures negative  #History of hepatitis C Outpatient follow-up with GI  #hyponatremia-very mild   #Alcohol abuse ciwa           Consults ID  DVT Prophylaxis  Lovenox  Lab Results  Component Value Date   PLT 177 01/09/2018     Time Spent in minutes   35 minutes greater than 50% of time spent in care coordination and counseling patient regarding the condition and plan of care.     M.D on 01/09/2018 at 1:04 PM  Between 7am to 6pm - Pager - [phone]  After 6pm go to [email or web address] - password [email or web address]  [facility]   Office  [phone]  

## 2018-01-09 NOTE — Consult Note (Signed)
[facility]ious [facility]     Reason for Consult: Meningitis   Referring Physician: Dustin Flock Date of Admission:  01/08/2018   Active Problems:   Acute purulent meningitis   HPI: Luke Rogers. is a 57 y.o. male with hx alcohol abuse, hep C, hx pancreatitis admitted with HA,  neck pain, nausea, malaise over 2-3 days. Had a bad cough, fevers and chills with NS about 2 weeks ago but did not seek care. He took OTC meds and had some improvement. 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 His friend has had cough for a few weeks as well. No hx TB or Tb contacts. He reports about 12 # wt loss over last 2 weeks due to illness.  On admit wbc 12, temp 105, UA neg, cocaine +, LP with   Past Medical History:  Diagnosis Date  . Alcohol abuse   . Hepatitis C   . Pancreatitis    Past Surgical History:  Procedure Laterality Date  . FOOT SURGERY Bilateral    As infant  . WRIST SURGERY Right    Social History   Tobacco Use  . Smoking status: Current Every Day Smoker    Packs/day: 1.00    Types: Cigarettes  . Smokeless tobacco: Never Used  Substance Use Topics  . Alcohol use: Yes  . Drug use: No   History reviewed. No pertinent family history.  Allergies: No Known Allergies  Current antibiotics: Antibiotics Given (last 72 hours)    Date/Time Action Medication Dose Rate   01/08/18 1924 New Bag/Given   cefTRIAXone (ROCEPHIN) 2 g in sodium chloride 0.9 % 100 mL IVPB 2 g 200 mL/hr   01/08/18 2009 New Bag/Given   acyclovir (ZOVIRAX) 770 mg in dextrose 5 % 150 mL IVPB 770 mg 165.4 mL/hr   01/08/18 2109 New Bag/Given   vancomycin (VANCOCIN) 500 mg in sodium chloride 0.9 % 100 mL IVPB 500 mg 100 mL/hr   01/08/18 2302 New Bag/Given   ampicillin (OMNIPEN) 2 g in sodium chloride 0.9 % 100 mL IVPB 2 g 300 mL/hr   01/08/18 2332 New Bag/Given   vancomycin (VANCOCIN) IVPB 1000 mg/200 mL premix 1,000 mg 200 mL/hr   01/09/18 0308 New Bag/Given   acyclovir (ZOVIRAX)  770 mg in dextrose 5 % 150 mL IVPB 770 mg 165.4 mL/hr   01/09/18 0308 New Bag/Given   ampicillin (OMNIPEN) 2 g in sodium chloride 0.9 % 100 mL IVPB 2 g 300 mL/hr   01/09/18 0403 New Bag/Given   vancomycin (VANCOCIN) 1,250 mg in sodium chloride 0.9 % 250 mL IVPB 1,250 mg 166.7 mL/hr   01/09/18 0606 New Bag/Given   cefTRIAXone (ROCEPHIN) 2 g in sodium chloride 0.9 % 100 mL IVPB 2 g 200 mL/hr   01/09/18 0823 New Bag/Given   ampicillin (OMNIPEN) 2 g in sodium chloride 0.9 % 100 mL IVPB 2 g 300 mL/hr      MEDICATIONS: . guaiFENesin  600 mg Oral BID    Review of Systems - 11 systems reviewed and negative per HPI   OBJECTIVE: Temp:  [97.5 F (36.4 C)-105 F (40.6 C)] 98.1 F (36.7 C) (06/19 1159) Pulse Rate:  [54-120] 77 (06/19 1159) Resp:  [16-20] 16 (06/19 1159) BP: (78-146)/(53-101) 101/66 (06/19 1159) SpO2:  [94 %-99 %] 94 % (06/19 1159) Weight:  [77.1 kg (170 lb)-77.9 kg (171 lb 11.8 oz)] 77.9 kg (171 lb 11.8 oz) (06/18 2243) Physical Exam  Constitutional: He is oriented to person, place, and  time. NAD  HENT: anicteric Mouth/Throat: Oropharynx is clear and dry . No oropharyngeal exudate.  Cardiovascular: Normal rate, regular rhythm and normal heart sounds. Exam reveals no gallop and no friction rub.  No murmur heard.  Pulmonary/Chest: poor air movement Abdominal: Soft. Bowel sounds are normal. He exhibits no distension. There is no tenderness.  Lymphadenopathy: He has no cervical adenopathy.  Neurological: He is alert and oriented to person, place, and time.  Skin: Skin is warm and dry. No rash noted. No erythema.  Psychiatric: He has a normal mood and affect. His behavior is normal.  Ext no cce  LABS: Results for orders placed or performed during the hospital encounter of 01/08/18 (from the past 48 hour(s))  Urinalysis, Complete w Microscopic     Status: Abnormal   Collection Time: 01/08/18  6:19 PM  Result Value Ref Range   Color, Urine YELLOW (A) YELLOW   APPearance  CLEAR (A) CLEAR   Specific Gravity, Urine 1.023 1.005 - 1.030   pH 5.0 5.0 - 8.0   Glucose, UA NEGATIVE NEGATIVE mg/dL   Hgb urine dipstick NEGATIVE NEGATIVE   Bilirubin Urine NEGATIVE NEGATIVE   Ketones, ur NEGATIVE NEGATIVE mg/dL   Protein, ur NEGATIVE NEGATIVE mg/dL   Nitrite NEGATIVE NEGATIVE   Leukocytes, UA NEGATIVE NEGATIVE   RBC / HPF 0-5 0 - 5 RBC/hpf   WBC, UA 0-5 0 - 5 WBC/hpf   Bacteria, UA NONE SEEN NONE SEEN   Squamous Epithelial / LPF NONE SEEN 0 - 5   Mucus PRESENT     Comment: Performed at [facility], [address] [place], [place] [address]  CSF cell count with differential collection tube #: 1     Status: Abnormal   Collection Time: 01/08/18  6:19 PM  Result Value Ref Range   Tube # 1    Color, CSF PINK (A) COLORLESS   Appearance, CSF HAZY (A) CLEAR   Supernatant CLEAR    RBC Count, CSF 4,452 (H) 0 - 3 /cu mm   WBC, CSF 23 (HH) 0 - 5 /cu mm    Comment: CRITICAL RESULT CALLED TO, READ BACK BY AND VERIFIED WITH: SAMANTHA HAMILTON ON 01/08/18 AT 2100 BY JAG    Segmented Neutrophils-CSF 4 %   Lymphs, CSF 74 %   Monocyte-Macrophage-Spinal Fluid 22 %   Eosinophils, CSF 0 %    Comment: Performed at [facility], [address] [place], [place] [address]  CSF cell count with differential collection tube #: 4     Status: Abnormal   Collection Time: 01/08/18  6:19 PM  Result Value Ref Range   Tube # 4    Color, CSF PINK (A) COLORLESS   Appearance, CSF HAZY (A) CLEAR   Supernatant CLEAR    RBC Count, CSF 6,295 (H) 0 - 3 /cu mm   WBC, CSF 45 (HH) 0 - 5 /cu mm    Comment: CRITICAL RESULT CALLED TO, READ BACK BY AND VERIFIED WITH: SAMANTHA HAMILTON ON 01/08/18 AT 2100 BY JAG    Segmented Neutrophils-CSF 4 %   Lymphs, CSF 77 %   Monocyte-Macrophage-Spinal Fluid 19 %   Eosinophils, CSF 0 %    Comment: Performed at [facility], [address] [place], [place] [address]  CSF culture     Status: None (Preliminary result)    Collection Time: 01/08/18  6:19 PM  Result Value Ref Range   Specimen Description CSF    Special Requests NONE  Gram Stain      NO ORGANISMS SEEN MODERATE WBC SEEN MANY RED BLOOD CELLS Performed at [facility], [address] [place], [place] [address]    Culture PENDING    Report Status PENDING   Protein and glucose, CSF     Status: Abnormal   Collection Time: 01/08/18  6:19 PM  Result Value Ref Range   Glucose, CSF 57 40 - 70 mg/dL   Total  Protein, CSF 48 (H) 15 - 45 mg/dL    Comment: Performed at [facility], [address], [place], [place] [address]  Pathologist smear review     Status: None   Collection Time: 01/08/18  6:19 PM  Result Value Ref Range   Path Review      Cytospin slide of CSF from tube 1 reviewed. There are predominantly lymphocytes, some of which are reactive and plasmacytoid. A few macrophages and neutrophils are also present. No malignant cells are identified.    Comment: Reviewed by Lemmie Evens. Dicie Beam, MD. Performed at [facility], [address] [place], [place] [address]   Comprehensive metabolic panel     Status: Abnormal   Collection Time: 01/08/18  6:24 PM  Result Value Ref Range   Sodium 132 (L) 135 - 145 mmol/L   Potassium 4.3 3.5 - 5.1 mmol/L   Chloride 99 (L) 101 - 111 mmol/L   CO2 22 22 - 32 mmol/L   Glucose, Bld 112 (H) 65 - 99 mg/dL   BUN 17 6 - 20 mg/dL   Creatinine, Ser 0.88 0.61 - 1.24 mg/dL   Calcium 9.0 8.9 - 10.3 mg/dL   Total Protein 8.9 (H) 6.5 - 8.1 g/dL   Albumin 3.9 3.5 - 5.0 g/dL   AST 55 (H) 15 - 41 U/L   ALT 45 17 - 63 U/L   Alkaline Phosphatase 55 38 - 126 U/L   Total Bilirubin 0.8 0.3 - 1.2 mg/dL   GFR calc non Af Amer >60 >60 mL/min   GFR calc Af Amer >60 >60 mL/min    Comment: (NOTE) The eGFR has been calculated using the CKD EPI equation. This calculation has not been validated in all clinical situations. eGFR's persistently <60 mL/min signify possible Chronic Kidney Disease.     Anion gap 11 5 - 15    Comment: Performed at [facility], [address] [place], [place] [address]  Ethanol     Status: None   Collection Time: 01/08/18  6:24 PM  Result Value Ref Range   Alcohol, Ethyl (B) <10 <10 mg/dL    Comment: (NOTE) Lowest detectable limit for serum alcohol is 10 mg/dL. For medical purposes only. Performed at [facility], [address] [place], [place] [address]   CBC with Differential     Status: Abnormal   Collection Time: 01/08/18  6:24 PM  Result Value Ref Range   WBC 12.6 (H) 3.8 - 10.6 K/uL   RBC 5.24 4.40 - 5.90 MIL/uL   Hemoglobin 16.3 13.0 - 18.0 g/dL   HCT 47.0 40.0 - 52.0 %   MCV 89.9 80.0 - 100.0 fL   MCH 31.1 26.0 - 34.0 pg   MCHC 34.6 32.0 - 36.0 g/dL   RDW 14.8 (H) 11.5 - 14.5 %   Platelets 227 150 - 440 K/uL   Neutrophils Relative % 56 %   Neutro Abs 7.1 (H) 1.4 - 6.5 K/uL   Lymphocytes Relative 36 %   Lymphs Abs 4.5 (H) 1.0 -  3.6 K/uL   Monocytes Relative 7 %   Monocytes Absolute 0.9 0.2 - 1.0 K/uL   Eosinophils Relative 0 %   Eosinophils Absolute 0.0 0 - 0.7 K/uL   Basophils Relative 1 %   Basophils Absolute 0.1 0 - 0.1 K/uL    Comment: Performed at [facility], [address] [place], [place] [address]  Protime-INR     Status: None   Collection Time: 01/08/18  6:24 PM  Result Value Ref Range   Prothrombin Time 13.5 11.4 - 15.2 seconds   INR 1.04     Comment: Performed at [facility], [address] [place], [place] [address]  CK     Status: Abnormal   Collection Time: 01/08/18  6:24 PM  Result Value Ref Range   Total CK 47 (L) 49 - 397 U/L    Comment: Performed at [facility], [address] [place], [place] [address]  CULTURE, BLOOD (ROUTINE X 2) w Reflex to ID Panel     Status: None (Preliminary result)   Collection Time: 01/08/18  7:07 PM  Result Value Ref Range   Specimen Description BLOOD LEFT ANTECUBITAL    Special Requests Blood Culture adequate volume     Culture      NO GROWTH < 12 HOURS Performed at [facility], [address] [place], [place] [address]    Report Status PENDING   Lactic acid, plasma     Status: None   Collection Time: 01/08/18  7:07 PM  Result Value Ref Range   Lactic Acid, Venous 0.9 0.5 - 1.9 mmol/L    Comment: Performed at [facility], [address] [place], [place] [address]  CULTURE, BLOOD (ROUTINE X 2) w Reflex to ID Panel     Status: None (Preliminary result)   Collection Time: 01/08/18  7:10 PM  Result Value Ref Range   Specimen Description BLOOD RIGHT ANTECUBITAL    Special Requests Blood Culture adequate volume    Culture      NO GROWTH < 12 HOURS Performed at [facility], [address] [place], [place] [address]    Report Status PENDING   Urine Drug Screen, Qualitative ([place] only)     Status: Abnormal   Collection Time: 01/08/18  7:45 PM  Result Value Ref Range   Tricyclic, Ur Screen NONE DETECTED NONE DETECTED   Amphetamines, Ur Screen NONE DETECTED NONE DETECTED   MDMA (Ecstasy)Ur Screen NONE DETECTED NONE DETECTED   Cocaine Metabolite,Ur [place] POSITIVE (A) NONE DETECTED   Opiate, Ur Screen NONE DETECTED NONE DETECTED   Phencyclidine (PCP) Ur S NONE DETECTED NONE DETECTED   Cannabinoid 50 Ng, Ur [place] NONE DETECTED NONE DETECTED   Barbiturates, Ur Screen (A) NONE DETECTED    Result not available. Reagent lot number recalled by manufacturer.   Benzodiazepine, Ur Scrn NONE DETECTED NONE DETECTED   Methadone Scn, Ur NONE DETECTED NONE DETECTED    Comment: (NOTE) Tricyclics + metabolites, urine    Cutoff 1000 ng/mL Amphetamines + metabolites, urine  Cutoff 1000 ng/mL MDMA (Ecstasy), urine              Cutoff 500 ng/mL Cocaine Metabolite, urine          Cutoff 300 ng/mL Opiate + metabolites, urine        Cutoff 300 ng/mL Phencyclidine (PCP), urine         Cutoff 25 ng/mL Cannabinoid, urine  Cutoff 50 ng/mL Barbiturates + metabolites, urine  Cutoff 200  ng/mL Benzodiazepine, urine              Cutoff 200 ng/mL Methadone, urine                   Cutoff 300 ng/mL The urine drug screen provides only a preliminary, unconfirmed analytical test result and should not be used for non-medical purposes. Clinical consideration and professional judgment should be applied to any positive drug screen result due to possible interfering substances. A more specific alternate chemical method must be used in order to obtain a confirmed analytical result. Gas chromatography / mass spectrometry (GC/MS) is the preferred confirmat ory method. Performed at [facility], [address] [place], [place] [address]   Lactic acid, plasma     Status: None   Collection Time: 01/08/18 10:42 PM  Result Value Ref Range   Lactic Acid, Venous 0.7 0.5 - 1.9 mmol/L    Comment: Performed at [facility], [address] [place], [place] [address]  CBC with Differential     Status: Abnormal   Collection Time: 01/08/18 10:42 PM  Result Value Ref Range   WBC 9.1 3.8 - 10.6 K/uL   RBC 4.47 4.40 - 5.90 MIL/uL   Hemoglobin 14.3 13.0 - 18.0 g/dL   HCT 40.1 40.0 - 52.0 %   MCV 89.9 80.0 - 100.0 fL   MCH 32.1 26.0 - 34.0 pg   MCHC 35.7 32.0 - 36.0 g/dL   RDW 14.6 (H) 11.5 - 14.5 %   Platelets 190 150 - 440 K/uL   Neutrophils Relative % 77 %   Neutro Abs 7.0 (H) 1.4 - 6.5 K/uL   Lymphocytes Relative 17 %   Lymphs Abs 1.6 1.0 - 3.6 K/uL   Monocytes Relative 5 %   Monocytes Absolute 0.5 0.2 - 1.0 K/uL   Eosinophils Relative 0 %   Eosinophils Absolute 0.0 0 - 0.7 K/uL   Basophils Relative 1 %   Basophils Absolute 0.1 0 - 0.1 K/uL    Comment: Performed at [facility], [address] [place], [place] [address]  Comprehensive metabolic panel     Status: Abnormal   Collection Time: 01/08/18 10:42 PM  Result Value Ref Range   Sodium 133 (L) 135 - 145 mmol/L   Potassium 3.8 3.5 - 5.1 mmol/L   Chloride 103 101 - 111 mmol/L   CO2 21 (L) 22 -  32 mmol/L   Glucose, Bld 123 (H) 65 - 99 mg/dL   BUN 18 6 - 20 mg/dL   Creatinine, Ser 0.96 0.61 - 1.24 mg/dL   Calcium 7.6 (L) 8.9 - 10.3 mg/dL   Total Protein 7.0 6.5 - 8.1 g/dL   Albumin 3.2 (L) 3.5 - 5.0 g/dL   AST 40 15 - 41 U/L   ALT 35 17 - 63 U/L   Alkaline Phosphatase 45 38 - 126 U/L   Total Bilirubin 0.5 0.3 - 1.2 mg/dL   GFR calc non Af Amer >60 >60 mL/min   GFR calc Af Amer >60 >60 mL/min    Comment: (NOTE) The eGFR has been calculated using the CKD EPI equation. This calculation has not been validated in all clinical situations. eGFR's persistently <60 mL/min signify possible Chronic Kidney Disease.    Anion gap 9 5 - 15    Comment: Performed at [facility], [address] [place],  [address]  Procalcitonin     Status:  None   Collection Time: 01/08/18 10:42 PM  Result Value Ref Range   Procalcitonin 0.33 ng/mL    Comment:        Interpretation: PCT (Procalcitonin) <= 0.5 ng/mL: Systemic infection (sepsis) is not likely. Local bacterial infection is possible. (NOTE)       Sepsis PCT Algorithm           Lower Respiratory Tract                                      Infection PCT Algorithm    ----------------------------     ----------------------------         PCT < 0.25 ng/mL                PCT < 0.10 ng/mL         Strongly encourage             Strongly discourage   discontinuation of antibiotics    initiation of antibiotics    ----------------------------     -----------------------------       PCT 0.25 - 0.50 ng/mL            PCT 0.10 - 0.25 ng/mL               OR       >80% decrease in PCT            Discourage initiation of                                            antibiotics      Encourage discontinuation           of antibiotics    ----------------------------     -----------------------------         PCT >= 0.50 ng/mL              PCT 0.26 - 0.50 ng/mL               AND        <80% decrease in PCT             Encourage initiation  of                                             antibiotics       Encourage continuation           of antibiotics    ----------------------------     -----------------------------        PCT >= 0.50 ng/mL                  PCT > 0.50 ng/mL               AND         increase in PCT                  Strongly encourage                                      initiation of antibiotics    Strongly encourage escalation  of antibiotics                                     -----------------------------                                           PCT <= 0.25 ng/mL                                                 OR                                        > 80% decrease in PCT                                     Discontinue / Do not initiate                                             antibiotics Performed at [facility] [address] [place], [place] [address]   Protime-INR     Status: None   Collection Time: 01/08/18 10:42 PM  Result Value Ref Range   Prothrombin Time 14.9 11.4 - 15.2 seconds   INR 1.18     Comment: Performed at [facility], [address] [place], [place] [address]  APTT     Status: None   Collection Time: 01/08/18 10:42 PM  Result Value Ref Range   aPTT 33 24 - 36 seconds    Comment: Performed at [facility], [address] [place], [place] [address]  CBC with Differential/Platelet     Status: Abnormal   Collection Time: 01/09/18  2:04 AM  Result Value Ref Range   WBC 7.9 3.8 - 10.6 K/uL   RBC 4.59 4.40 - 5.90 MIL/uL   Hemoglobin 14.6 13.0 - 18.0 g/dL   HCT 41.5 40.0 - 52.0 %   MCV 90.6 80.0 - 100.0 fL   MCH 31.9 26.0 - 34.0 pg   MCHC 35.2 32.0 - 36.0 g/dL   RDW 14.8 (H) 11.5 - 14.5 %   Platelets 177 150 - 440 K/uL   Neutrophils Relative % 71 %   Neutro Abs 5.5 1.4 - 6.5 K/uL   Lymphocytes Relative 25 %   Lymphs Abs 2.0 1.0 - 3.6 K/uL   Monocytes Relative 4 %   Monocytes Absolute 0.3 0.2 - 1.0 K/uL   Eosinophils Relative  0 %   Eosinophils Absolute 0.0 0 - 0.7 K/uL   Basophils Relative 0 %   Basophils Absolute 0.0 0 - 0.1 K/uL    Comment: Performed at [facility], [address] [place],  [address]  Basic metabolic panel     Status: Abnormal   Collection Time: 01/09/18  2:04 AM  Result Value Ref Range   Sodium 133 (L) 135 - 145 mmol/L   Potassium 3.9 3.5 - 5.1 mmol/L  Chloride 104 101 - 111 mmol/L   CO2 21 (L) 22 - 32 mmol/L   Glucose, Bld 146 (H) 65 - 99 mg/dL   BUN 17 6 - 20 mg/dL   Creatinine, Ser 0.86 0.61 - 1.24 mg/dL   Calcium 7.5 (L) 8.9 - 10.3 mg/dL   GFR calc non Af Amer >60 >60 mL/min   GFR calc Af Amer >60 >60 mL/min    Comment: (NOTE) The eGFR has been calculated using the CKD EPI equation. This calculation has not been validated in all clinical situations. eGFR's persistently <60 mL/min signify possible Chronic Kidney Disease.    Anion gap 8 5 - 15    Comment: Performed at [facility], [place], [place] [address]  Lactic acid, plasma     Status: None   Collection Time: 01/09/18  2:04 AM  Result Value Ref Range   Lactic Acid, Venous 1.0 0.5 - 1.9 mmol/L    Comment: Performed at [facility], [address] [place], [place] [address]   No components found for: ESR, C REACTIVE PROTEIN MICRO: Recent Results (from the past 720 hour(s))  CSF culture     Status: None (Preliminary result)   Collection Time: 01/08/18  6:19 PM  Result Value Ref Range Status   Specimen Description CSF  Final   Special Requests NONE  Final   Gram Stain   Final    NO ORGANISMS SEEN MODERATE WBC SEEN MANY RED BLOOD CELLS Performed at [facility], [address] [place], [place] [address]    Culture PENDING  Incomplete   Report Status PENDING  Incomplete  CULTURE, BLOOD (ROUTINE X 2) w Reflex to ID Panel     Status: None (Preliminary result)   Collection Time: 01/08/18  7:07 PM  Result Value Ref Range Status   Specimen Description  BLOOD LEFT ANTECUBITAL  Final   Special Requests Blood Culture adequate volume  Final   Culture   Final    NO GROWTH < 12 HOURS Performed at [facility], [address], [place], [place] [address]    Report Status PENDING  Incomplete  CULTURE, BLOOD (ROUTINE X 2) w Reflex to ID Panel     Status: None (Preliminary result)   Collection Time: 01/08/18  7:10 PM  Result Value Ref Range Status   Specimen Description BLOOD RIGHT ANTECUBITAL  Final   Special Requests Blood Culture adequate volume  Final   Culture   Final    NO GROWTH < 12 HOURS Performed at [facility], [address], [place],  [address]    Report Status PENDING  Incomplete    IMAGING: Ct Head Wo Contrast  Result Date: 01/08/2018 CLINICAL DATA:  Headache for 1 week. Blurred vision. Vomiting. History of alcohol abuse. EXAM: CT HEAD WITHOUT CONTRAST TECHNIQUE: Contiguous axial images were obtained from the base of the skull through the vertex without intravenous contrast. COMPARISON:  01/23/2010 FINDINGS: Brain: There is no evidence of acute infarct, intracranial hemorrhage, mass, midline shift, or significant extra-axial fluid collection. 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 The ventricles are normal in size. Vascular: No hyperdense vessel. Skull: No fracture or focal osseous lesion. Sinuses/Orbits: Visualized paranasal sinuses and mastoid air cells are clear. Orbits are unremarkable. Other: None. IMPRESSION: No acute or significant intracranial findings. Electronically Signed   By: Logan Bores M.D.   On:  01/08/2018 18:37   Assessment:   Avrum Kimball. is a 56 y.o. male with hx Hep C (states treated 10 years ago), chronic etoh use, now with 2 weeks of illness beginning with bad cough, HA and F/C/NS followed by brief improvement and now with severe HA, temp 105, and  recurrent cough.  His wbc is not impressive, LA nml, PC slightly elevated. CT head neg, LFTs neg. Utox + cocaine (pt adamantly denies). LP has lots of RBC (pt says it was a difficult tap) on both tube 1 and 4 but only 20-40 wbc, mainly lymphs.  I do not think he has bacterial meningitis, likely has pneumonia with a bloodly tap or aseptic meningitis.  Recommendations Check CXR Check HIV, RPR and Hep C Check Strep PNA and Legionella urine antigen Treat with ceftriaxone and Azitrhomycin pending results.  Thank you very much for allowing me to participate in the care of this patient. Please call with questions.   Cheral Marker. Ola Spurr, MD

## 2018-01-09 NOTE — Progress Notes (Signed)
Initial Nutrition Assessment  DOCUMENTATION CODES:   Non-severe (moderate) malnutrition in context of acute illness/injury  INTERVENTION:   - Ensure Enlive po BID, each supplement provides 350 kcal and 20 grams of protein  - MVI with minerals daily  - Encourage PO intake  NUTRITION DIAGNOSIS:   Moderate Malnutrition related to acute illness, poor appetite(meningitis) as evidenced by mild fat depletion, moderate fat depletion, mild muscle depletion, moderate muscle depletion.  GOAL:   Patient will meet greater than or equal to 90% of their needs  MONITOR:   PO intake, Supplement acceptance, Labs, Weight trends  REASON FOR ASSESSMENT:   Malnutrition Screening Tool    ASSESSMENT:   57 year old male who presented to the ED with headache, neck pain, nausea, and generalize malaise. PMH significant for EtOH abuse and hepatitis C. Pt admitted with meningitis.  Spoke with pt at bedside who reports that he has started having a worsening headache. Pt asked RD to alert RN.  Pt states that for the past 2.5 weeks, he has not had much of an appetite. Pt reports that this is related to his headache and feeling "heat" all of the time. Pt reports taking "a lot of medicine" to help with his symptoms during that time. Pt states that his intake was very limited during the past 2.5 weeks and included "a little soup here and there."  Pt reports that his appetite has improved. RD noted 100% completed breakfast meal tray and 75% completed lunch meal tray in room at time of visit.  Pt states that prior to feeling poorly over the past 2.5 weeks, he typically eats 2 meals daily. A meal might include a burger or pizza. Pt takes care of his parents who are not able to cook as much as they used to.  Pt reports that his UBW is 200 lbs. Pt believes he weighed this 4 months ago. Pt reports that he weighed 200 lbs when he was released from jail but lost 15 lbs within the first month. No weight history  available in chart to confirm weight loss. Pt reports that he tends to eat less when he is drinking.  Medications reviewed and include: IV antibiotics  Labs reviewed: sodium 133 (L), CO2 21 (L), calcium 7.5 (L), UDS positive for cocaine  NUTRITION - FOCUSED PHYSICAL EXAM:    Most Recent Value  Orbital Region  Mild depletion  Upper Arm Region  Moderate depletion  Thoracic and Lumbar Region  Mild depletion  Buccal Region  Mild depletion  Temple Region  Mild depletion  Clavicle Bone Region  Mild depletion  Clavicle and Acromion Bone Region  Mild depletion  Scapular Bone Region  Unable to assess  Dorsal Hand  No depletion  Patellar Region  Moderate depletion  Anterior Thigh Region  Moderate depletion  Posterior Calf Region  Mild depletion  Edema (RD Assessment)  None  Hair  Reviewed  Eyes  Reviewed  Mouth  Reviewed  Skin  Reviewed  Nails  Reviewed       Diet Order:   Diet Order           Diet regular Room service appropriate? Yes; Fluid consistency: Thin  Diet effective now          EDUCATION NEEDS:   Education needs have been addressed  Skin:  Skin Assessment: Reviewed RN Assessment  Last BM:  01/08/18  Height:   Ht Readings from Last 1 Encounters:  01/08/18 6\' 3"  (1.905 m)    Weight:   Wt  Readings from Last 1 Encounters:  01/08/18 171 lb 11.8 oz (77.9 kg)    Ideal Body Weight:  89.1 kg  BMI:  Body mass index is 21.47 kg/m.  Estimated Nutritional Needs:   Kcal:  2150-2350 kcal/day  Protein:  105-120 grams/day  Fluid:  >/= 2.3 L/day    Gaynell Face, MS, RD, LDN Pager: [phone] Weekend/After Hours: [phone]

## 2018-01-10 LAB — RESPIRATORY PANEL BY PCR
Adenovirus: NOT DETECTED
BORDETELLA PERTUSSIS-RVPCR: NOT DETECTED
CHLAMYDOPHILA PNEUMONIAE-RVPPCR: NOT DETECTED
CORONAVIRUS 229E-RVPPCR: NOT DETECTED
CORONAVIRUS HKU1-RVPPCR: NOT DETECTED
Coronavirus NL63: NOT DETECTED
Coronavirus OC43: NOT DETECTED
INFLUENZA B-RVPPCR: NOT DETECTED
Influenza A: NOT DETECTED
METAPNEUMOVIRUS-RVPPCR: NOT DETECTED
MYCOPLASMA PNEUMONIAE-RVPPCR: NOT DETECTED
PARAINFLUENZA VIRUS 2-RVPPCR: NOT DETECTED
Parainfluenza Virus 1: NOT DETECTED
Parainfluenza Virus 3: NOT DETECTED
Parainfluenza Virus 4: NOT DETECTED
RESPIRATORY SYNCYTIAL VIRUS-RVPPCR: NOT DETECTED
Rhinovirus / Enterovirus: NOT DETECTED

## 2018-01-10 LAB — CBC
HCT: 43.7 % (ref 40.0–52.0)
HEMOGLOBIN: 15.1 g/dL (ref 13.0–18.0)
MCH: 31.5 pg (ref 26.0–34.0)
MCHC: 34.5 g/dL (ref 32.0–36.0)
MCV: 91.4 fL (ref 80.0–100.0)
PLATELETS: 116 10*3/uL — AB (ref 150–440)
RBC: 4.78 MIL/uL (ref 4.40–5.90)
RDW: 15.1 % — ABNORMAL HIGH (ref 11.5–14.5)
WBC: 7.6 10*3/uL (ref 3.8–10.6)

## 2018-01-10 LAB — GLUCOSE, CAPILLARY
GLUCOSE-CAPILLARY: 69 mg/dL (ref 65–99)
Glucose-Capillary: 84 mg/dL (ref 65–99)

## 2018-01-10 LAB — LACTIC ACID, PLASMA
LACTIC ACID, VENOUS: 1.5 mmol/L (ref 0.5–1.9)
Lactic Acid, Venous: 1.2 mmol/L (ref 0.5–1.9)

## 2018-01-10 LAB — INFLUENZA PANEL BY PCR (TYPE A & B)
INFLAPCR: NEGATIVE
Influenza B By PCR: NEGATIVE

## 2018-01-10 LAB — HERPES SIMPLEX VIRUS(HSV) DNA BY PCR
HSV 1 DNA: NEGATIVE
HSV 2 DNA: NEGATIVE

## 2018-01-10 LAB — LEGIONELLA PNEUMOPHILA SEROGP 1 UR AG: L. PNEUMOPHILA SEROGP 1 UR AG: NEGATIVE

## 2018-01-10 MED ORDER — SODIUM CHLORIDE 0.9 % IV BOLUS
1000.0000 mL | Freq: Once | INTRAVENOUS | Status: AC
  Administered 2018-01-10: 1000 mL via INTRAVENOUS

## 2018-01-10 MED ORDER — FOLIC ACID 1 MG PO TABS
1.0000 mg | ORAL_TABLET | Freq: Every day | ORAL | Status: DC
  Administered 2018-01-10 – 2018-01-11 (×2): 1 mg via ORAL
  Filled 2018-01-10 (×3): qty 1

## 2018-01-10 MED ORDER — LORAZEPAM 2 MG/ML IJ SOLN: 1.0000 mg | Freq: Four times a day (QID) | INTRAMUSCULAR | Status: DC | PRN

## 2018-01-10 MED ORDER — IBUPROFEN 400 MG PO TABS
400.0000 mg | ORAL_TABLET | Freq: Four times a day (QID) | ORAL | Status: DC | PRN
  Administered 2018-01-10: 400 mg via ORAL
  Filled 2018-01-10: qty 1

## 2018-01-10 MED ORDER — SODIUM CHLORIDE 0.9 % IV SOLN
INTRAVENOUS | Status: DC
  Administered 2018-01-10 – 2018-01-11 (×3): via INTRAVENOUS

## 2018-01-10 MED ORDER — VITAMIN B-1 100 MG PO TABS
100.0000 mg | ORAL_TABLET | Freq: Every day | ORAL | Status: DC
  Administered 2018-01-10 – 2018-01-11 (×2): 100 mg via ORAL
  Filled 2018-01-10 (×3): qty 1

## 2018-01-10 MED ORDER — TRAMADOL HCL 50 MG PO TABS
50.0000 mg | ORAL_TABLET | Freq: Four times a day (QID) | ORAL | Status: DC | PRN
  Administered 2018-01-10: 50 mg via ORAL
  Filled 2018-01-10: qty 1

## 2018-01-10 MED ORDER — ADULT MULTIVITAMIN W/MINERALS CH
1.0000 | ORAL_TABLET | Freq: Every day | ORAL | Status: DC
  Administered 2018-01-10 – 2018-01-11 (×2): 1 via ORAL
  Filled 2018-01-10 (×3): qty 1

## 2018-01-10 MED ORDER — THIAMINE HCL 100 MG/ML IJ SOLN: 100.0000 mg | Freq: Every day | INTRAMUSCULAR | Status: DC

## 2018-01-10 MED ORDER — ENOXAPARIN SODIUM 40 MG/0.4ML ~~LOC~~ SOLN
40.0000 mg | SUBCUTANEOUS | Status: DC
  Administered 2018-01-10 – 2018-01-11 (×2): 40 mg via SUBCUTANEOUS
  Filled 2018-01-10 (×2): qty 0.4

## 2018-01-10 MED ORDER — LORAZEPAM 1 MG PO TABS: 1.0000 mg | ORAL_TABLET | Freq: Four times a day (QID) | ORAL | Status: DC | PRN

## 2018-01-10 MED ORDER — DOXYCYCLINE HYCLATE 100 MG PO TABS
100.0000 mg | ORAL_TABLET | Freq: Two times a day (BID) | ORAL | Status: DC
  Administered 2018-01-10 – 2018-01-12 (×5): 100 mg via ORAL
  Filled 2018-01-10 (×5): qty 1

## 2018-01-10 NOTE — Progress Notes (Signed)
[facility] PROGRESS NOTE Date of Admission:  01/08/2018     ID: Luke Rogers. is a 57 y.o. male with fever cough HA Active Problems:   Acute purulent meningitis   Malnutrition of moderate degree   Subjective: Still febrile yest but feels a little better today. Denies HA neck pain ROS  Eleven systems are reviewed and negative except per hpi  Medications:  Antibiotics Given (last 72 hours)    Date/Time Action Medication Dose Rate   01/08/18 1924 New Bag/Given   cefTRIAXone (ROCEPHIN) 2 g in sodium chloride 0.9 % 100 mL IVPB 2 g 200 mL/hr   01/08/18 2009 New Bag/Given   acyclovir (ZOVIRAX) 770 mg in dextrose 5 % 150 mL IVPB 770 mg 165.4 mL/hr   01/08/18 2109 New Bag/Given   vancomycin (VANCOCIN) 500 mg in sodium chloride 0.9 % 100 mL IVPB 500 mg 100 mL/hr   01/08/18 2302 New Bag/Given   ampicillin (OMNIPEN) 2 g in sodium chloride 0.9 % 100 mL IVPB 2 g 300 mL/hr   01/08/18 2332 New Bag/Given   vancomycin (VANCOCIN) IVPB 1000 mg/200 mL premix 1,000 mg 200 mL/hr   01/09/18 0308 New Bag/Given   acyclovir (ZOVIRAX) 770 mg in dextrose 5 % 150 mL IVPB 770 mg 165.4 mL/hr   01/09/18 0308 New Bag/Given   ampicillin (OMNIPEN) 2 g in sodium chloride 0.9 % 100 mL IVPB 2 g 300 mL/hr   01/09/18 0403 New Bag/Given   vancomycin (VANCOCIN) 1,250 mg in sodium chloride 0.9 % 250 mL IVPB 1,250 mg 166.7 mL/hr   01/09/18 0606 New Bag/Given   cefTRIAXone (ROCEPHIN) 2 g in sodium chloride 0.9 % 100 mL IVPB 2 g 200 mL/hr   01/09/18 0823 New Bag/Given   ampicillin (OMNIPEN) 2 g in sodium chloride 0.9 % 100 mL IVPB 2 g 300 mL/hr   01/09/18 1522 New Bag/Given   azithromycin (ZITHROMAX) 500 mg in sodium chloride 0.9 % 250 mL IVPB 500 mg 250 mL/hr   01/09/18 2034 New Bag/Given   cefTRIAXone (ROCEPHIN) 2 g in sodium chloride 0.9 % 100 mL IVPB 2 g 200 mL/hr   01/10/18 1027 New Bag/Given   cefTRIAXone (ROCEPHIN) 2 g in sodium chloride 0.9 % 100 mL IVPB 2 g 200 mL/hr   01/10/18  1030 Given   doxycycline (VIBRA-TABS) tablet 100 mg 100 mg      . doxycycline  100 mg Oral Q12H  . enoxaparin (LOVENOX) injection  40 mg Subcutaneous Q24H  . feeding supplement (ENSURE ENLIVE)  237 mL Oral BID BM  . folic acid  1 mg Oral Daily  . guaiFENesin  600 mg Oral BID  . multivitamin with minerals  1 tablet Oral Daily  . multivitamin with minerals  1 tablet Oral Daily  . thiamine  100 mg Oral Daily   Or  . thiamine  100 mg Intravenous Daily    Objective: Vital signs in last 24 hours: Temp:  [98.4 F (36.9 C)-102.9 F (39.4 C)] 98.8 F (37.1 C) (06/20 1305) Pulse Rate:  [74-104] 88 (06/20 1305) Resp:  [19-20] 19 (06/20 1305) BP: (73-106)/(51-94) 73/51 (06/20 1325) SpO2:  [93 %-95 %] 95 % (06/20 1305) Constitutional: He is oriented to person, place, and time. NAD  HENT: anicteric Mouth/Throat: Oropharynx is clear and dry . No oropharyngeal exudate.  Cardiovascular: Normal rate, regular rhythm and normal heart sounds. Exam reveals no gallop and no friction rub.  No murmur heard.  Pulmonary/Chest: poor air movement Abdominal: Soft. Bowel  sounds are normal. He exhibits no distension. There is no tenderness.  Lymphadenopathy: He has no cervical adenopathy.  Neurological: He is alert and oriented to person, place, and time.  Skin: Skin is warm and dry. No rash noted. No erythema.  Psychiatric: He has a normal mood and affect. His behavior is normal.  Ext no cce    Lab Results Recent Labs    01/08/18 2242 01/09/18 0204 01/10/18 0438  WBC 9.1 7.9 7.6  HGB 14.3 14.6 15.1  HCT 40.1 41.5 43.7  NA 133* 133*  --   K 3.8 3.9  --   CL 103 104  --   CO2 21* 21*  --   BUN 18 17  --   CREATININE 0.96 0.86  --     Microbiology: Results for orders placed or performed during the hospital encounter of 01/08/18  CSF culture     Status: None (Preliminary result)   Collection Time: 01/08/18  6:19 PM  Result Value Ref Range Status   Specimen Description   Final     CSF Performed at [facility], [address], [place], [place] [address]    Special Requests   Final    NONE Performed at [facility], [address] [place], [place] [address]    Gram Stain   Final    NO ORGANISMS SEEN MODERATE WBC SEEN MANY RED BLOOD CELLS Performed at [facility], [address], [place], [place] [address]    Culture   Final    NO GROWTH 1 DAY Performed at [facility], [place] [address], [place], [place] [address]    Report Status PENDING  Incomplete  CULTURE, BLOOD (ROUTINE X 2) w Reflex to ID Panel     Status: None (Preliminary result)   Collection Time: 01/08/18  7:07 PM  Result Value Ref Range Status   Specimen Description BLOOD LEFT ANTECUBITAL  Final   Special Requests Blood Culture adequate volume  Final   Culture   Final    NO GROWTH 2 DAYS Performed at [facility], [address], [place], [place] [address]    Report Status PENDING  Incomplete  CULTURE, BLOOD (ROUTINE X 2) w Reflex to ID Panel     Status: None (Preliminary result)   Collection Time: 01/08/18  7:10 PM  Result Value Ref Range Status   Specimen Description BLOOD RIGHT ANTECUBITAL  Final   Special Requests Blood Culture adequate volume  Final   Culture   Final    NO GROWTH 2 DAYS Performed at [facility], [address], [place], [place] [address]    Report Status PENDING  Incomplete  Respiratory Panel by PCR     Status: None   Collection Time: 01/09/18  3:25 PM  Result Value Ref Range Status   Adenovirus NOT DETECTED NOT DETECTED Final   Coronavirus 229E NOT DETECTED NOT DETECTED Final   Coronavirus HKU1 NOT DETECTED NOT DETECTED Final   Coronavirus NL63 NOT DETECTED NOT DETECTED Final   Coronavirus OC43 NOT DETECTED NOT DETECTED Final   Metapneumovirus NOT DETECTED NOT DETECTED Final   Rhinovirus / Enterovirus NOT DETECTED NOT DETECTED Final   Influenza A NOT DETECTED NOT DETECTED Final   Influenza B NOT DETECTED  NOT DETECTED Final   Parainfluenza Virus 1 NOT DETECTED NOT DETECTED Final   Parainfluenza Virus 2 NOT DETECTED NOT DETECTED Final   Parainfluenza Virus 3 NOT DETECTED NOT DETECTED Final   Parainfluenza Virus 4  NOT DETECTED NOT DETECTED Final   Respiratory Syncytial Virus NOT DETECTED NOT DETECTED Final   Bordetella pertussis NOT DETECTED NOT DETECTED Final   Chlamydophila pneumoniae NOT DETECTED NOT DETECTED Final   Mycoplasma pneumoniae NOT DETECTED NOT DETECTED Final    Comment: Performed at [facility], [place] [address], [place], [place] [address]    Studies/Results: Ct Head Wo Contrast  Result Date: 01/08/2018 CLINICAL DATA:  Headache for 1 week. Blurred vision. Vomiting. History of alcohol abuse. EXAM: CT HEAD WITHOUT CONTRAST TECHNIQUE: Contiguous axial images were obtained from the base of the skull through the vertex without intravenous contrast. COMPARISON:  01/23/2010 FINDINGS: Brain: There is no evidence of acute infarct, intracranial hemorrhage, mass, midline shift, or significant extra-axial fluid collection. 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 The ventricles are normal in size. Vascular: No hyperdense vessel. Skull: No fracture or focal osseous lesion. Sinuses/Orbits: Visualized paranasal sinuses and mastoid air cells are clear. Orbits are unremarkable. Other: None. IMPRESSION: No acute or significant intracranial findings. Electronically Signed   By: Logan Bores M.D.   On: 01/08/2018 18:37   Dg Chest Port 1 View  Result Date: 01/09/2018 CLINICAL DATA:  Headache, neck pain, malaise. Weight loss. History of substance abuse. EXAM: PORTABLE CHEST 1 VIEW COMPARISON:  None. FINDINGS: Cardiac silhouette is mildly enlarged. Mildly calcified aortic knob. No pleural effusion or focal consolidation. Mild biapical pleural thickening. No pneumothorax. Soft  tissue planes and included osseous structures are non suspicious. IMPRESSION: Mild cardiomegaly.  No acute pulmonary process. Aortic Atherosclerosis (ICD10-I70.0). Electronically Signed   By: Elon Alas M.D.   On: 01/09/2018 15:12    Assessment/Plan: Luke Rogers. is a 57 y.o. male with hx Hep C (states treated 10 years ago), chronic etoh use, now with 2 weeks of illness beginning with bad cough, HA and F/C/NS followed by brief improvement and now with severe HA, temp 105, and recurrent cough.  His wbc is not impressive, LA nml, PC slightly elevated. CT head neg, LFTs neg. Utox + cocaine (pt adamantly denies). LP has lots of RBC (pt says it was a difficult tap) on both tube 1 and 4 but only 20-40 wbc, mainly lymphs.  I do not think he has bacterial meningitis, likely has pneumonia with a bloodly tap or aseptic meningitis. 6/20 - still with fevers, CXR neg. RESP PCR neg, Strep PNA neg.  Recommendations Cont ceftriaxone pending final CSF results Change azithro  to doxy to cover tick borne illnesses Thank you very much for the consult. Will follow with you.  Luke Rogers   01/10/2018, 1:54 PM

## 2018-01-10 NOTE — Progress Notes (Signed)
Verbal order from Infectious disease to take pt off of droplet precautions.   Luke Rogers [employer]

## 2018-01-10 NOTE — Progress Notes (Signed)
Pt.'s temp at 0604 101.9 oral, PRN tylenol was given at 0641. On recheck at 0757 temp was 102.9 oral. Cooling blanket and cool wash cloth to forehead was placed on pt. MD notified, new orders placed by MD. Recheck of temp at 0922 is 100.4 oral. Pt is resting in bed. RN will continue to monitor pt closely.   Parrish Bonn [employer]

## 2018-01-10 NOTE — Progress Notes (Signed)
[place] at [facility]                                                                                                                                                                                  Patient Demographics   Luke Rogers, is a 57 y.o. male, DOB - 03/30/1961, VPX:[id number]  Admit date - 01/08/2018   Admitting Physician Nicholes Mango, MD  Outpatient Primary MD for the patient is Patient, No Pcp Per   LOS - 2  Subjective: Patient continues to have fever had hypotension again    Review of Systems:   CONSTITUTIONAL: Positive fever. No fatigue, weakness. No weight gain, no weight loss.  EYES: No blurry or double vision.  ENT: No tinnitus. No postnasal drip. No redness of the oropharynx.  RESPIRATORY: No cough, no wheeze, no hemoptysis. No dyspnea.  CARDIOVASCULAR: No chest pain. No orthopnea. No palpitations. No syncope.  GASTROINTESTINAL: No nausea, no vomiting or diarrhea. No abdominal pain. No melena or hematochezia.  GENITOURINARY: No dysuria or hematuria.  ENDOCRINE: No polyuria or nocturia. No heat or cold intolerance.  HEMATOLOGY: No anemia. No bruising. No bleeding.  INTEGUMENTARY: No rashes. No lesions.  MUSCULOSKELETAL: No arthritis. No swelling. No gout.  NEUROLOGIC: No numbness, tingling, or ataxia. No seizure-type activity.  Positive headache PSYCHIATRIC: No anxiety. No insomnia. No ADD.    Vitals:   Vitals:   01/10/18 0922 01/10/18 1030 01/10/18 1305 01/10/18 1325  BP:   (!) 85/51 (!) 73/51  Pulse:   88   Resp:   19   Temp: (!) 100.4 F (38 C) 98.4 F (36.9 C) 98.8 F (37.1 C)   TempSrc: Oral Oral Oral   SpO2:   95%   Weight:      Height:        Wt Readings from Last 3 Encounters:  01/08/18 77.9 kg (171 lb 11.8 oz)     Intake/Output Summary (Last 24 hours) at 01/10/2018 1451 Last data filed at 01/10/2018 1207 Gross per 24 hour  Intake 4635.25 ml  Output 0 ml  Net 4635.25 ml    Physical Exam:    GENERAL: Pleasant-appearing in no apparent distress.  HEAD, EYES, EARS, NOSE AND THROAT: Atraumatic, normocephalic. Extraocular muscles are intact. Pupils equal and reactive to light. Sclerae anicteric. No conjunctival injection. No oro-pharyngeal erythema.  NECK: Supple. There is no jugular venous distention. No bruits, no lymphadenopathy, no thyromegaly.  HEART: Regular rate and rhythm,. No murmurs, no rubs, no clicks.  LUNGS: Clear to auscultation bilaterally. No rales or rhonchi. No wheezes.  ABDOMEN: Soft, flat, nontender, nondistended. Has good bowel sounds. No hepatosplenomegaly appreciated.  EXTREMITIES: No evidence of any  cyanosis, clubbing, or peripheral edema.  +2 pedal and radial pulses bilaterally.  NEUROLOGIC: The patient is alert, awake, and oriented x3 with no focal motor or sensory deficits appreciated bilaterally.  SKIN: Moist and warm with no rashes appreciated.  Psych: Not anxious, depressed LN: No inguinal LN enlargement    Antibiotics   Anti-infectives (From admission, onward)   Start     Dose/Rate Route Frequency Ordered Stop   01/10/18 1000  doxycycline (VIBRA-TABS) tablet 100 mg     100 mg Oral Every 12 hours 01/10/18 0925     01/09/18 1600  azithromycin (ZITHROMAX) 500 mg in sodium chloride 0.9 % 250 mL IVPB  Status:  Discontinued     500 mg 250 mL/hr over 60 Minutes Intravenous Every 24 hours 01/09/18 1448 01/10/18 0926   01/09/18 0700  cefTRIAXone (ROCEPHIN) 2 g in sodium chloride 0.9 % 100 mL IVPB     2 g 200 mL/hr over 30 Minutes Intravenous Every 12 hours 01/08/18 1932     01/09/18 0500  vancomycin (VANCOCIN) 1,250 mg in sodium chloride 0.9 % 250 mL IVPB  Status:  Discontinued     1,250 mg 166.7 mL/hr over 90 Minutes Intravenous Every 12 hours 01/08/18 1932 01/09/18 0907   01/09/18 0400  acyclovir (ZOVIRAX) 770 mg in dextrose 5 % 150 mL IVPB  Status:  Discontinued     770 mg 165.4 mL/hr over 60 Minutes Intravenous Every 8 hours 01/08/18 1932 01/09/18  0907   01/08/18 2045  ampicillin (OMNIPEN) 2 g in sodium chloride 0.9 % 100 mL IVPB  Status:  Discontinued     2 g 300 mL/hr over 20 Minutes Intravenous Every 4 hours 01/08/18 2031 01/09/18 0907   01/08/18 1930  acyclovir (ZOVIRAX) 770 mg in dextrose 5 % 150 mL IVPB     10 mg/kg  77.1 kg 165.4 mL/hr over 60 Minutes Intravenous  Once 01/08/18 1925 01/08/18 2109   01/08/18 1930  vancomycin (VANCOCIN) 500 mg in sodium chloride 0.9 % 100 mL IVPB     500 mg 100 mL/hr over 60 Minutes Intravenous  Once 01/08/18 1928 01/08/18 2209   01/08/18 1915  vancomycin (VANCOCIN) IVPB 1000 mg/200 mL premix     1,000 mg 200 mL/hr over 60 Minutes Intravenous  Once 01/08/18 1911 01/09/18 0032   01/08/18 1915  acyclovir (ZOVIRAX) 800 mg in dextrose 5 % 150 mL IVPB  Status:  Discontinued     800 mg 166 mL/hr over 60 Minutes Intravenous  Once 01/08/18 1911 01/08/18 1925   01/08/18 1900  cefTRIAXone (ROCEPHIN) 2 g in sodium chloride 0.9 % 100 mL IVPB     2 g 200 mL/hr over 30 Minutes Intravenous  Once 01/08/18 1901 01/08/18 2000   01/08/18 1853  sodium chloride 0.9 % with cefTRIAXone (ROCEPHIN) ADS Med    Note to Pharmacy:  Luke Rogers  : cabinet override      01/08/18 1853 01/09/18 0714      Medications   Scheduled Meds: . doxycycline  100 mg Oral Q12H  . enoxaparin (LOVENOX) injection  40 mg Subcutaneous Q24H  . feeding supplement (ENSURE ENLIVE)  237 mL Oral BID BM  . folic acid  1 mg Oral Daily  . guaiFENesin  600 mg Oral BID  . multivitamin with minerals  1 tablet Oral Daily  . thiamine  100 mg Oral Daily   Or  . thiamine  100 mg Intravenous Daily   Continuous Infusions: . sodium chloride 125 mL/hr at 01/10/18 1111  .  cefTRIAXone (ROCEPHIN)  IV Stopped (01/10/18 1103)  . sodium chloride     PRN Meds:.acetaminophen, guaiFENesin-dextromethorphan, ibuprofen, LORazepam **OR** LORazepam, ondansetron (ZOFRAN) IV, traMADol   Data Review:   Micro Results Recent Results (from the past 240  hour(s))  CSF culture     Status: None (Preliminary result)   Collection Time: 01/08/18  6:19 PM  Result Value Ref Range Status   Specimen Description   Final    CSF Performed at [facility], [address], [place], [place] [address]    Special Requests   Final    NONE Performed at [facility], [address] [place], [place] [address]    Gram Stain   Final    NO ORGANISMS SEEN MODERATE WBC SEEN MANY RED BLOOD CELLS Performed at [facility], [address], [place], [place] [address]    Culture   Final    NO GROWTH 1 DAY Performed at [facility], [place] [address], [place], [place] [address]    Report Status PENDING  Incomplete  CULTURE, BLOOD (ROUTINE X 2) w Reflex to ID Panel     Status: None (Preliminary result)   Collection Time: 01/08/18  7:07 PM  Result Value Ref Range Status   Specimen Description BLOOD LEFT ANTECUBITAL  Final   Special Requests Blood Culture adequate volume  Final   Culture   Final    NO GROWTH 2 DAYS Performed at [facility], [address] [place], [place] [address]    Report Status PENDING  Incomplete  CULTURE, BLOOD (ROUTINE X 2) w Reflex to ID Panel     Status: None (Preliminary result)   Collection Time: 01/08/18  7:10 PM  Result Value Ref Range Status   Specimen Description BLOOD RIGHT ANTECUBITAL  Final   Special Requests Blood Culture adequate volume  Final   Culture   Final    NO GROWTH 2 DAYS Performed at [facility], [address], [place], [place] [address]    Report Status PENDING  Incomplete  Respiratory Panel by PCR     Status: None   Collection Time: 01/09/18  3:25 PM  Result Value Ref Range Status   Adenovirus NOT DETECTED NOT DETECTED Final   Coronavirus 229E NOT DETECTED NOT DETECTED Final   Coronavirus HKU1 NOT DETECTED NOT DETECTED Final   Coronavirus NL63 NOT DETECTED NOT DETECTED Final   Coronavirus OC43 NOT DETECTED NOT DETECTED Final   Metapneumovirus  NOT DETECTED NOT DETECTED Final   Rhinovirus / Enterovirus NOT DETECTED NOT DETECTED Final   Influenza A NOT DETECTED NOT DETECTED Final   Influenza B NOT DETECTED NOT DETECTED Final   Parainfluenza Virus 1 NOT DETECTED NOT DETECTED Final   Parainfluenza Virus 2 NOT DETECTED NOT DETECTED Final   Parainfluenza Virus 3 NOT DETECTED NOT DETECTED Final   Parainfluenza Virus 4 NOT DETECTED NOT DETECTED Final   Respiratory Syncytial Virus NOT DETECTED NOT DETECTED Final   Bordetella pertussis NOT DETECTED NOT DETECTED Final   Chlamydophila pneumoniae NOT DETECTED NOT DETECTED Final   Mycoplasma pneumoniae NOT DETECTED NOT DETECTED Final    Comment: Performed at [facility], [place] [address], [place], [place] [address]    Radiology Reports Ct Head Wo Contrast  Result Date: 01/08/2018 CLINICAL DATA:  Headache for 1 week. Blurred vision. Vomiting. History of alcohol abuse. EXAM: CT HEAD WITHOUT CONTRAST TECHNIQUE: Contiguous axial images were obtained from the base of the skull through the vertex without  intravenous contrast. COMPARISON:  01/23/2010 FINDINGS: Brain: There is no evidence of acute infarct, intracranial hemorrhage, mass, midline shift, or significant extra-axial fluid collection. 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 The ventricles are normal in size. Vascular: No hyperdense vessel. Skull: No fracture or focal osseous lesion. Sinuses/Orbits: Visualized paranasal sinuses and mastoid air cells are clear. Orbits are unremarkable. Other: None. IMPRESSION: No acute or significant intracranial findings. Electronically Signed   By: Logan Bores M.D.   On: 01/08/2018 18:37   Dg Chest Port 1 View  Result Date: 01/09/2018 CLINICAL DATA:  Headache, neck pain, malaise. Weight loss. History of substance abuse. EXAM: PORTABLE CHEST 1 VIEW COMPARISON:  None. FINDINGS:  Cardiac silhouette is mildly enlarged. Mildly calcified aortic knob. No pleural effusion or focal consolidation. Mild biapical pleural thickening. No pneumothorax. Soft tissue planes and included osseous structures are non suspicious. IMPRESSION: Mild cardiomegaly.  No acute pulmonary process. Aortic Atherosclerosis (ICD10-I70.0). Electronically Signed   By: Elon Alas M.D.   On: 01/09/2018 15:12     CBC Recent Labs  Lab 01/08/18 1824 01/08/18 2242 01/09/18 0204 01/10/18 0438  WBC 12.6* 9.1 7.9 7.6  HGB 16.3 14.3 14.6 15.1  HCT 47.0 40.1 41.5 43.7  PLT 227 190 177 116*  MCV 89.9 89.9 90.6 91.4  MCH 31.1 32.1 31.9 31.5  MCHC 34.6 35.7 35.2 34.5  RDW 14.8* 14.6* 14.8* 15.1*  LYMPHSABS 4.5* 1.6 2.0  --   MONOABS 0.9 0.5 0.3  --   EOSABS 0.0 0.0 0.0  --   BASOSABS 0.1 0.1 0.0  --     Chemistries  Recent Labs  Lab 01/08/18 1824 01/08/18 2242 01/09/18 0204  NA 132* 133* 133*  K 4.3 3.8 3.9  CL 99* 103 104  CO2 22 21* 21*  GLUCOSE 112* 123* 146*  BUN 17 18 17   CREATININE 0.88 0.96 0.86  CALCIUM 9.0 7.6* 7.5*  AST 55* 40  --   ALT 45 35  --   ALKPHOS 55 45  --   BILITOT 0.8 0.5  --    ------------------------------------------------------------------------------------------------------------------ estimated creatinine clearance is 108.2 mL/min (by C-G formula based on SCr of 0.86 mg/dL). ------------------------------------------------------------------------------------------------------------------ No results for input(s): HGBA1C in the last 72 hours. ------------------------------------------------------------------------------------------------------------------ No results for input(s): CHOL, HDL, LDLCALC, TRIG, CHOLHDL, LDLDIRECT in the last 72 hours. ------------------------------------------------------------------------------------------------------------------ No results for input(s): TSH, T4TOTAL, T3FREE, THYROIDAB in the last 72 hours.  Invalid input(s):  FREET3 ------------------------------------------------------------------------------------------------------------------ No results for input(s): VITAMINB12, FOLATE, FERRITIN, TIBC, IRON, RETICCTPCT in the last 72 hours.  Coagulation profile Recent Labs  Lab 01/08/18 1824 01/08/18 2242  INR 1.04 1.18    No results for input(s): DDIMER in the last 72 hours.  Cardiac Enzymes No results for input(s): CKMB, TROPONINI, MYOGLOBIN in the last 168 hours.  Invalid input(s): CK ------------------------------------------------------------------------------------------------------------------ Invalid input(s): [place]   Luke Rogers  is a 57 y.o. male with a known history of alcohol abuse, hepatitis C is presenting to the ED with a chief complaint of headache, neck pain associated with nausea and generalized malaise for 1 day.  Patient has long-standing history of alcohol abuse and his last drink was yesterday and admits to binging for the past 5 days.  Patient reports throbbing neck pain radiating to the back of the head at the time of arrival.  Temperature was 105 F, leukocytosis and tachycardic.  Sepsis  protocol implemented and patient had LP done patient is started on broad-spectrum antibiotics for acute meningitis   #Acute meningitis-based on patient's CSF evaluation viral meningitis Which shows, less than 100 WBCs lymphocyte predominant consistent with viral meningitis Continue IV ceftriaxone , doxycycline has been added  #Sepsis Patient met septic criteria at the time of admission with fever, tachycardia, leukocytosis IV fluids Blood cultures negative  #History of hepatitis C Outpatient follow-up with GI  #hyponatremia-very mild   #Alcohol abuse ciwa           Consults ID  DVT Prophylaxis  Lovenox  Lab Results  Component Value Date   PLT 116 (L) 01/10/2018     Time Spent in minutes   35 minutes greater than 50% of time spent in  care coordination and counseling patient regarding the condition and plan of care.   Dustin Flock M.D on 01/10/2018 at 2:51 PM  Between 7am to 6pm - Pager - [phone]  After 6pm go to [email or web address] - Proofreader  [employer]   Office  [phone]

## 2018-01-10 NOTE — Progress Notes (Signed)
MD notified that pt.'s BP is 73/51 at 1325, pt is diaphoretic, CBG 69- ginger ale was given to pt. pt is alert and oriented. New orders for 1L of bolus to give at this time. Recheck BP after bolus at 1510 was 97/70. Will continue to monitor pt.   Tekeyah Santiago [employer]

## 2018-01-11 LAB — RPR: RPR Ser Ql: NONREACTIVE

## 2018-01-11 LAB — HIV ANTIBODY (ROUTINE TESTING W REFLEX): HIV Screen 4th Generation wRfx: NONREACTIVE

## 2018-01-11 MED ORDER — BENZONATATE 100 MG PO CAPS
100.0000 mg | ORAL_CAPSULE | Freq: Three times a day (TID) | ORAL | Status: DC | PRN
  Administered 2018-01-11: 100 mg via ORAL
  Filled 2018-01-11: qty 1

## 2018-01-11 NOTE — Progress Notes (Signed)
ID E note Fevers improving. BCX neg If improved clinically can dc on oral doxy 100 bid for total 7 days.

## 2018-01-11 NOTE — Progress Notes (Signed)
[place] at [facility]                                                                                                                                                                                  Patient Demographics   Yasmin Dibello, is a 57 y.o. male, DOB - [birth year], FYB:[id number]  Admit date - 01/08/2018   Admitting Physician Nicholes Mango, MD  Outpatient Primary MD for the patient is Patient, No Pcp Per   LOS - 3  Subjective: Doing better fevers decreased    Review of Systems:   CONSTITUTIONAL: Positive fever. No fatigue, weakness. No weight gain, no weight loss.  EYES: No blurry or double vision.  ENT: No tinnitus. No postnasal drip. No redness of the oropharynx.  RESPIRATORY: No cough, no wheeze, no hemoptysis. No dyspnea.  CARDIOVASCULAR: No chest pain. No orthopnea. No palpitations. No syncope.  GASTROINTESTINAL: No nausea, no vomiting or diarrhea. No abdominal pain. No melena or hematochezia.  GENITOURINARY: No dysuria or hematuria.  ENDOCRINE: No polyuria or nocturia. No heat or cold intolerance.  HEMATOLOGY: No anemia. No bruising. No bleeding.  INTEGUMENTARY: No rashes. No lesions.  MUSCULOSKELETAL: No arthritis. No swelling. No gout.  NEUROLOGIC: No numbness, tingling, or ataxia. No seizure-type activity.  Positive headache PSYCHIATRIC: No anxiety. No insomnia. No ADD.    Vitals:   Vitals:   01/11/18 0037 01/11/18 0544 01/11/18 0735 01/11/18 1220  BP: (!) 81/50 (!) 93/55 100/60 107/66  Pulse: 79 83  70  Resp: _0 Temp: 100.1 F (37.8 C) 99.7 F (37.6 C) 99.5 F (37.5 C) 98.9 F (37.2 C)  TempSrc: Oral Oral Oral Oral  SpO2: 94% 95%  95%  Weight:      Height:        Wt Readings from Last 3 Encounters:  01/08/18 77.9 kg (171 lb 11.8 oz)     Intake/Output Summary (Last 24 hours) at 01/11/2018 1446 Last data filed at 01/11/2018 1405 Gross per 24 hour  Intake 3098 ml  Output 300 ml  Net 2798 ml    Physical  Exam:   GENERAL: Pleasant-appearing in no apparent distress.  HEAD, EYES, EARS, NOSE AND THROAT: Atraumatic, normocephalic. Extraocular muscles are intact. Pupils equal and reactive to light. Sclerae anicteric. No conjunctival injection. No oro-pharyngeal erythema.  NECK: Supple. There is no jugular venous distention. No bruits, no lymphadenopathy, no thyromegaly.  HEART: Regular rate and rhythm,. No murmurs, no rubs, no clicks.  LUNGS: Clear to auscultation bilaterally. No rales or rhonchi. No wheezes.  ABDOMEN: Soft, flat, nontender, nondistended. Has good bowel sounds. No hepatosplenomegaly appreciated.  EXTREMITIES: No evidence of any cyanosis, clubbing,  or peripheral edema.  +2 pedal and radial pulses bilaterally.  NEUROLOGIC: The patient is alert, awake, and oriented x3 with no focal motor or sensory deficits appreciated bilaterally.  SKIN: Moist and warm with no rashes appreciated.  Psych: Not anxious, depressed LN: No inguinal LN enlargement    Antibiotics   Anti-infectives (From admission, onward)   Start     Dose/Rate Route Frequency Ordered Stop   01/10/18 1000  doxycycline (VIBRA-TABS) tablet 100 mg     100 mg Oral Every 12 hours 01/10/18 0925     01/09/18 1600  azithromycin (ZITHROMAX) 500 mg in sodium chloride 0.9 % 250 mL IVPB  Status:  Discontinued     500 mg 250 mL/hr over 60 Minutes Intravenous Every 24 hours 01/09/18 1448 01/10/18 0926   01/09/18 0700  cefTRIAXone (ROCEPHIN) 2 g in sodium chloride 0.9 % 100 mL IVPB  Status:  Discontinued     2 g 200 mL/hr over 30 Minutes Intravenous Every 12 hours 01/08/18 1932 01/11/18 0844   01/09/18 0500  vancomycin (VANCOCIN) 1,250 mg in sodium chloride 0.9 % 250 mL IVPB  Status:  Discontinued     1,250 mg 166.7 mL/hr over 90 Minutes Intravenous Every 12 hours 01/08/18 1932 01/09/18 0907   01/09/18 0400  acyclovir (ZOVIRAX) 770 mg in dextrose 5 % 150 mL IVPB  Status:  Discontinued     770 mg 165.4 mL/hr over 60 Minutes  Intravenous Every 8 hours 01/08/18 1932 01/09/18 0907   01/08/18 2045  ampicillin (OMNIPEN) 2 g in sodium chloride 0.9 % 100 mL IVPB  Status:  Discontinued     2 g 300 mL/hr over 20 Minutes Intravenous Every 4 hours 01/08/18 2031 01/09/18 0907   01/08/18 1930  acyclovir (ZOVIRAX) 770 mg in dextrose 5 % 150 mL IVPB     10 mg/kg  77.1 kg 165.4 mL/hr over 60 Minutes Intravenous  Once 01/08/18 1925 01/08/18 2109   01/08/18 1930  vancomycin (VANCOCIN) 500 mg in sodium chloride 0.9 % 100 mL IVPB     500 mg 100 mL/hr over 60 Minutes Intravenous  Once 01/08/18 1928 01/08/18 2209   01/08/18 1915  vancomycin (VANCOCIN) IVPB 1000 mg/200 mL premix     1,000 mg 200 mL/hr over 60 Minutes Intravenous  Once 01/08/18 1911 01/09/18 0032   01/08/18 1915  acyclovir (ZOVIRAX) 800 mg in dextrose 5 % 150 mL IVPB  Status:  Discontinued     800 mg 166 mL/hr over 60 Minutes Intravenous  Once 01/08/18 1911 01/08/18 1925   01/08/18 1900  cefTRIAXone (ROCEPHIN) 2 g in sodium chloride 0.9 % 100 mL IVPB     2 g 200 mL/hr over 30 Minutes Intravenous  Once 01/08/18 1901 01/08/18 2000   01/08/18 1853  sodium chloride 0.9 % with cefTRIAXone (ROCEPHIN) ADS Med    Note to Pharmacy:  [name]  : cabinet override      01/08/18 1853 01/09/18 0714      Medications   Scheduled Meds: . doxycycline  100 mg Oral Q12H  . enoxaparin (LOVENOX) injection  40 mg Subcutaneous Q24H  . feeding supplement (ENSURE ENLIVE)  237 mL Oral BID BM  . folic acid  1 mg Oral Daily  . guaiFENesin  600 mg Oral BID  . multivitamin with minerals  1 tablet Oral Daily  . thiamine  100 mg Oral Daily   Or  . thiamine  100 mg Intravenous Daily   Continuous Infusions: . sodium chloride 125 mL/hr at  01/11/18 0733  . sodium chloride     PRN Meds:.acetaminophen, guaiFENesin-dextromethorphan, ibuprofen, LORazepam **OR** LORazepam, ondansetron (ZOFRAN) IV, traMADol   Data Review:   Micro Results Recent Results (from the past 240  hour(s))  CSF culture     Status: None (Preliminary result)   Collection Time: 01/08/18  6:19 PM  Result Value Ref Range Status   Specimen Description   Final    CSF Performed at [facility], [address] [place], [place] [address]    Special Requests   Final    NONE Performed at [facility], [address] [place], [place] [address]    Gram Stain   Final    NO ORGANISMS SEEN MODERATE WBC SEEN MANY RED BLOOD CELLS Performed at [facility], [address] [place], [place] [address]    Culture   Final    NO GROWTH 3 DAYS Performed at [facility], [place] [address] [place]  [address]    Report Status PENDING  Incomplete  CULTURE, BLOOD (ROUTINE X 2) w Reflex to ID Panel     Status: None (Preliminary result)   Collection Time: 01/08/18  7:07 PM  Result Value Ref Range Status   Specimen Description BLOOD LEFT ANTECUBITAL  Final   Special Requests Blood Culture adequate volume  Final   Culture   Final    NO GROWTH 3 DAYS Performed at [facility], [address] [place], [place] [address]    Report Status PENDING  Incomplete  CULTURE, BLOOD (ROUTINE X 2) w Reflex to ID Panel     Status: None (Preliminary result)   Collection Time: 01/08/18  7:10 PM  Result Value Ref Range Status   Specimen Description BLOOD RIGHT ANTECUBITAL  Final   Special Requests Blood Culture adequate volume  Final   Culture   Final    NO GROWTH 3 DAYS Performed at [facility], [address] [place], [place] [address]    Report Status PENDING  Incomplete  Respiratory Panel by PCR     Status: None   Collection Time: 01/09/18  3:25 PM  Result Value Ref Range Status   Adenovirus NOT DETECTED NOT DETECTED Final   Coronavirus 229E NOT DETECTED NOT DETECTED Final   Coronavirus HKU1 NOT DETECTED NOT DETECTED Final   Coronavirus NL63 NOT DETECTED NOT DETECTED Final   Coronavirus OC43 NOT DETECTED NOT DETECTED Final   Metapneumovirus  NOT DETECTED NOT DETECTED Final   Rhinovirus / Enterovirus NOT DETECTED NOT DETECTED Final   Influenza A NOT DETECTED NOT DETECTED Final   Influenza B NOT DETECTED NOT DETECTED Final   Parainfluenza Virus 1 NOT DETECTED NOT DETECTED Final   Parainfluenza Virus 2 NOT DETECTED NOT DETECTED Final   Parainfluenza Virus 3 NOT DETECTED NOT DETECTED Final   Parainfluenza Virus 4 NOT DETECTED NOT DETECTED Final   Respiratory Syncytial Virus NOT DETECTED NOT DETECTED Final   Bordetella pertussis NOT DETECTED NOT DETECTED Final   Chlamydophila pneumoniae NOT DETECTED NOT DETECTED Final   Mycoplasma pneumoniae NOT DETECTED NOT DETECTED Final    Comment: Performed at [facility], [place] [address] [place], [place] [address]    Radiology Reports Ct Head Wo Contrast  Result Date: 01/08/2018 CLINICAL DATA:  Headache for 1 week. Blurred vision. Vomiting. History of alcohol abuse. EXAM: CT HEAD WITHOUT CONTRAST TECHNIQUE: Contiguous axial images were obtained from the base of the skull through the vertex without intravenous contrast. COMPARISON:  01/23/2010  FINDINGS: Brain: There is no evidence of acute infarct, intracranial hemorrhage, mass, midline shift, or significant extra-axial fluid collection. 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 The ventricles are normal in size. Vascular: No hyperdense vessel. Skull: No fracture or focal osseous lesion. Sinuses/Orbits: Visualized paranasal sinuses and mastoid air cells are clear. Orbits are unremarkable. Other: None. IMPRESSION: No acute or significant intracranial findings. Electronically Signed   By: Logan Bores M.D.   On: 01/08/2018 18:37   Dg Chest Port 1 View  Result Date: 01/09/2018 CLINICAL DATA:  Headache, neck pain, malaise. Weight loss. History of substance abuse. EXAM: PORTABLE CHEST 1 VIEW COMPARISON:  None. FINDINGS:  Cardiac silhouette is mildly enlarged. Mildly calcified aortic knob. No pleural effusion or focal consolidation. Mild biapical pleural thickening. No pneumothorax. Soft tissue planes and included osseous structures are non suspicious. IMPRESSION: Mild cardiomegaly.  No acute pulmonary process. Aortic Atherosclerosis (ICD10-I70.0). Electronically Signed   By: Elon Alas M.D.   On: 01/09/2018 15:12     CBC Recent Labs  Lab 01/08/18 1824 01/08/18 2242 01/09/18 0204 01/10/18 0438  WBC 12.6* 9.1 7.9 7.6  HGB 16.3 14.3 14.6 15.1  HCT 47.0 40.1 41.5 43.7  PLT 227 190 177 116*  MCV 89.9 89.9 90.6 91.4  MCH 31.1 32.1 31.9 31.5  MCHC 34.6 35.7 35.2 34.5  RDW 14.8* 14.6* 14.8* 15.1*  LYMPHSABS 4.5* 1.6 2.0  --   MONOABS 0.9 0.5 0.3  --   EOSABS 0.0 0.0 0.0  --   BASOSABS 0.1 0.1 0.0  --     Chemistries  Recent Labs  Lab 01/08/18 1824 01/08/18 2242 01/09/18 0204  NA 132* 133* 133*  K 4.3 3.8 3.9  CL 99* 103 104  CO2 22 21* 21*  GLUCOSE 112* 123* 146*  BUN _0 CREATININE 0.88 0.96 0.86  CALCIUM 9.0 7.6* 7.5*  AST 55* 40  --   ALT 45 35  --   ALKPHOS 55 45  --   BILITOT 0.8 0.5  --    ------------------------------------------------------------------------------------------------------------------ estimated creatinine clearance is 108.2 mL/min (by C-G formula based on SCr of 0.86 mg/dL). ------------------------------------------------------------------------------------------------------------------ No results for input(s): HGBA1C in the last 72 hours. ------------------------------------------------------------------------------------------------------------------ No results for input(s): CHOL, HDL, LDLCALC, TRIG, CHOLHDL, LDLDIRECT in the last 72 hours. ------------------------------------------------------------------------------------------------------------------ No results for input(s): TSH, T4TOTAL, T3FREE, THYROIDAB in the last 72 hours.  Invalid input(s):  FREET3 ------------------------------------------------------------------------------------------------------------------ No results for input(s): VITAMINB12, FOLATE, FERRITIN, TIBC, IRON, RETICCTPCT in the last 72 hours.  Coagulation profile Recent Labs  Lab 01/08/18 1824 01/08/18 2242  INR 1.04 1.18    No results for input(s): DDIMER in the last 72 hours.  Cardiac Enzymes No results for input(s): CKMB, TROPONINI, MYOGLOBIN in the last 168 hours.  Invalid input(s): CK ------------------------------------------------------------------------------------------------------------------ Invalid input(s): [place]   Myson Levi  is a 57 y.o. male with a known history of alcohol abuse, hepatitis C is presenting to the ED with a chief complaint of headache, neck pain associated with nausea and generalized malaise for 1 day.  Patient has long-standing history of alcohol abuse and his last drink was yesterday and admits to binging for the past 5 days.  Patient reports throbbing neck pain radiating to the back of the head at the time of arrival.  Temperature was 105 F, leukocytosis and tachycardic.  Sepsis protocol implemented and patient had  LP done patient is started on broad-spectrum antibiotics for acute meningitis   #Acute viral  meningitis-based on patient's CSF evaluation viral meningitis Which shows, less than 100 WBCs lymphocyte predominant consistent with viral meningitis Continue oral doxycycline as per ID Monitor today may be able to discharge tomorrow   #Sepsis Patient met septic criteria at the time of admission with fever, tachycardia, leukocytosis IV fluids Blood cultures negative  #History of hepatitis C Outpatient follow-up with GI  #hyponatremia-BMP in the morning   #Alcohol abuse [place], no evidence of withdrawal           Consults ID  DVT Prophylaxis  Lovenox  Lab Results  Component Value Date   PLT 116 (L) 01/10/2018      Time Spent in minutes   35 minutes greater than 50% of time spent in care coordination and counseling patient regarding the condition and plan of care.   Dustin Flock M.D on 01/11/2018 at 2:46 PM  Between 7am to 6pm - Pager - [phone]  After 6pm go to [email or web address] - Proofreader  [employer]   Office  [phone]

## 2018-01-12 LAB — CBC
HCT: 40.1 % (ref 40.0–52.0)
Hemoglobin: 14.1 g/dL (ref 13.0–18.0)
MCH: 32.2 pg (ref 26.0–34.0)
MCHC: 35.3 g/dL (ref 32.0–36.0)
MCV: 91.4 fL (ref 80.0–100.0)
PLATELETS: 76 10*3/uL — AB (ref 150–440)
RBC: 4.38 MIL/uL — ABNORMAL LOW (ref 4.40–5.90)
RDW: 15.2 % — ABNORMAL HIGH (ref 11.5–14.5)
WBC: 9.3 10*3/uL (ref 3.8–10.6)

## 2018-01-12 LAB — BASIC METABOLIC PANEL
ANION GAP: 6 (ref 5–15)
BUN: 14 mg/dL (ref 6–20)
CO2: 22 mmol/L (ref 22–32)
Calcium: 7.5 mg/dL — ABNORMAL LOW (ref 8.9–10.3)
Chloride: 106 mmol/L (ref 101–111)
Creatinine, Ser: 0.61 mg/dL (ref 0.61–1.24)
Glucose, Bld: 96 mg/dL (ref 65–99)
Potassium: 3.8 mmol/L (ref 3.5–5.1)
Sodium: 134 mmol/L — ABNORMAL LOW (ref 135–145)

## 2018-01-12 LAB — CSF CULTURE
CULTURE: NO GROWTH
GRAM STAIN: NONE SEEN

## 2018-01-12 LAB — ROCKY MTN SPOTTED FVR ABS PNL(IGG+IGM)
RMSF IGM: 0.17 {index} (ref 0.00–0.89)
RMSF IgG: NEGATIVE

## 2018-01-12 LAB — HCV AB W REFLEX TO QUANT PCR: HCV Ab: >11 s/co ratio — ABNORMAL HIGH (ref 0.0–0.9)

## 2018-01-12 LAB — HCV RT-PCR, QUANT (NON-GRAPH): HEPATITIS C QUANTITATION: NOT DETECTED [IU]/mL

## 2018-01-12 MED ORDER — DOXYCYCLINE HYCLATE 100 MG PO TABS: 100.0000 mg | ORAL_TABLET | Freq: Two times a day (BID) | ORAL | 0 refills | Status: DC

## 2018-01-12 MED ORDER — ADULT MULTIVITAMIN W/MINERALS CH: 1.0000 | ORAL_TABLET | Freq: Every day | ORAL | 0 refills | Status: DC

## 2018-01-12 NOTE — Progress Notes (Signed)
Pt discharged per MD order. Medications an discharge instructions reviewed with pt. Pt verbalized understanding. All questions answered to pt satisfaction. Pt declined wheelchair and walked out to car.

## 2018-01-12 NOTE — Discharge Instructions (Signed)
Follow-up with your PCP.

## 2018-01-12 NOTE — Discharge Summary (Signed)
[place] at [place] NAME: Luke Rogers    MR#:  [id number]  DATE OF BIRTH:  [birth year]  DATE OF ADMISSION:  01/08/2018 ADMITTING PHYSICIAN: Nicholes Mango, MD  DATE OF DISCHARGE: 01/12/2018  PRIMARY CARE PHYSICIAN: Patient, No Pcp Per    ADMISSION DIAGNOSIS:  Meningitis [G03.9] Sepsis, due to unspecified organism (Luke Rogers) [A41.9]  DISCHARGE DIAGNOSIS:  Sepsis due to Bronchitis Aseptic meningitis SECONDARY DIAGNOSIS:   Past Medical History:  Diagnosis Date  . Alcohol abuse   . Hepatitis C   . Pancreatitis     HOSPITAL COURSE:   [name] a known history of alcohol abuse, hepatitis C is presenting to the ED with a chief complaint of headache, neck pain associated with nausea and generalized malaise for 1 day. Patient has long-standing history of alcohol abuse and his last drink was yesterday and admits to binging for the past 5 days. Patient reports throbbing neck pain radiating to the back of the head at the time of arrival. Temperature was 105 F, leukocytosis and tachycardic. Sepsis protocol implemented and patient had LP done patient is started on broad-spectrum antibiotics for acute meningitis   #Acute viral  meningitis-based on patient's CSF evaluation viral meningitis Which shows, less than 100 WBCs lymphocyte predominant consistent with viral meningitis Continue oral doxycycline as per ID Remains afebrile. Pt anxious to go home.  #Sepsis Patient met septic criteria at the time of admission with fever, tachycardia, leukocytosis Received IV fluids Blood cultures negative, CSF cx negative, wbc count normal  #History of hepatitisC Outpatient follow-up with GI  #hyponatremia-improving  #Alcohol abuse [place], no evidence of withdrawal  pt overall doing well. D/c home   CONSULTS OBTAINED:  Treatment Team:  Luke Ramsay, MD  DRUG ALLERGIES:  No Known  Allergies  DISCHARGE MEDICATIONS:   Allergies as of 01/12/2018   No Known Allergies     Medication List    TAKE these medications   doxycycline 100 MG tablet Commonly known as:  VIBRA-TABS Take 1 tablet (100 mg total) by mouth every 12 (twelve) hours.   multivitamin with minerals Tabs tablet Take 1 tablet by mouth daily. Start taking on:  01/13/2018       If you experience worsening of your admission symptoms, develop shortness of breath, life threatening emergency, suicidal or homicidal thoughts you must seek medical attention immediately by calling 911 or calling your MD immediately  if symptoms less severe.  You Must read complete instructions/literature along with all the possible adverse reactions/side effects for all the Medicines you take and that have been prescribed to you. Take any new Medicines after you have completely understood and accept all the possible adverse reactions/side effects.   Please note  You were cared for by a hospitalist during your hospital stay. If you have any questions about your discharge medications or the care you received while you were in the hospital after you are discharged, you can call the unit and asked to speak with the hospitalist on call if the hospitalist that took care of you is not available. Once you are discharged, your primary care physician will handle any further medical issues. Please note that NO REFILLS for any discharge medications will be authorized once you are discharged, as it is imperative that you return to your primary care physician (or establish a relationship with a primary care physician if you do not have one) for your aftercare needs so that they can reassess your need  for medications and monitor your lab values. Today   SUBJECTIVE     VITAL SIGNS:  Blood pressure 97/63, pulse 68, temperature 98.3 F (36.8 C), temperature source Oral, resp. rate 20, height 6' 3"  (1.905 m), weight 77.9 kg (171 lb 11.8 oz), SpO2  95 %.  I/O:    Intake/Output Summary (Last 24 hours) at 01/12/2018 1030 Last data filed at 01/12/2018 1021 Gross per 24 hour  Intake 2309 ml  Output 175 ml  Net 2134 ml    PHYSICAL EXAMINATION:  GENERAL:  57 y.o.-year-old patient lying in the bed with no acute distress.  EYES: Pupils equal, round, reactive to light and accommodation. No scleral icterus. Extraocular muscles intact.  HEENT: Head atraumatic, normocephalic. Oropharynx and nasopharynx clear.  NECK:  Supple, no jugular venous distention. No thyroid enlargement, no tenderness.  LUNGS: Normal breath sounds bilaterally, no wheezing, rales,rhonchi or crepitation. No use of accessory muscles of respiration.  CARDIOVASCULAR: S1, S2 normal. No murmurs, rubs, or gallops.  ABDOMEN: Soft, non-tender, non-distended. Bowel sounds present. No organomegaly or mass.  EXTREMITIES: No pedal edema, cyanosis, or clubbing.  NEUROLOGIC: Cranial nerves II through XII are intact. Muscle strength 5/5 in all extremities. Sensation intact. Gait not checked.  PSYCHIATRIC: The patient is alert and oriented x 3.  SKIN: tattoos ++.   DATA REVIEW:   CBC  Recent Labs  Lab 01/12/18 0448  WBC 9.3  HGB 14.1  HCT 40.1  PLT 76*    Chemistries  Recent Labs  Lab 01/08/18 2242  01/12/18 0448  NA 133*   < > 134*  K 3.8   < > 3.8  CL 103   < > 106  CO2 21*   < > 22  GLUCOSE 123*   < > 96  BUN 18   < > 14  CREATININE 0.96   < > 0.61  CALCIUM 7.6*   < > 7.5*  AST 40  --   --   ALT 35  --   --   ALKPHOS 45  --   --   BILITOT 0.5  --   --    < > = values in this interval not displayed.    Microbiology Results   Recent Results (from the past 240 hour(s))  CSF culture     Status: None   Collection Time: 01/08/18  6:19 PM  Result Value Ref Range Status   Specimen Description   Final    CSF Performed at [facility], [address] [place], [place] [address]    Special Requests   Final    NONE Performed at [facility], [address] [place], [place] [address]    Gram Stain   Final    NO ORGANISMS SEEN MODERATE WBC SEEN MANY RED BLOOD CELLS Performed at [facility], [address] [place], [place] [address]    Culture   Final    NO GROWTH 3 DAYS Performed at [facility], [place] [address] [place],  [address]    Report Status 01/12/2018 FINAL  Final  CULTURE, BLOOD (ROUTINE X 2) w Reflex to ID Panel     Status: None (Preliminary result)   Collection Time: 01/08/18  7:07 PM  Result Value Ref Range Status   Specimen Description BLOOD LEFT ANTECUBITAL  Final   Special Requests Blood Culture adequate volume  Final   Culture   Final    NO GROWTH 4 DAYS Performed at [facility], [address] [place], [place] [address]    Report Status PENDING  Incomplete  CULTURE, BLOOD (ROUTINE X 2) w Reflex to ID Panel     Status: None (Preliminary result)   Collection Time: 01/08/18  7:10 PM  Result Value Ref Range Status   Specimen Description BLOOD RIGHT ANTECUBITAL  Final   Special Requests Blood Culture adequate volume  Final   Culture   Final    NO GROWTH 4 DAYS Performed at [facility], [address] [place], [place] [address]    Report Status PENDING  Incomplete  Respiratory Panel by PCR     Status: None   Collection Time: 01/09/18  3:25 PM  Result Value Ref Range Status   Adenovirus NOT DETECTED NOT DETECTED Final   Coronavirus 229E NOT DETECTED NOT DETECTED Final   Coronavirus HKU1 NOT DETECTED NOT DETECTED Final   Coronavirus NL63 NOT DETECTED NOT DETECTED Final   Coronavirus OC43 NOT DETECTED NOT DETECTED Final   Metapneumovirus NOT DETECTED NOT DETECTED Final   Rhinovirus / Enterovirus NOT DETECTED NOT DETECTED Final   Influenza A NOT DETECTED NOT DETECTED Final   Influenza B NOT DETECTED NOT DETECTED Final   Parainfluenza Virus 1 NOT DETECTED NOT DETECTED Final   Parainfluenza Virus 2 NOT DETECTED NOT DETECTED Final   Parainfluenza  Virus 3 NOT DETECTED NOT DETECTED Final   Parainfluenza Virus 4 NOT DETECTED NOT DETECTED Final   Respiratory Syncytial Virus NOT DETECTED NOT DETECTED Final   Bordetella pertussis NOT DETECTED NOT DETECTED Final   Chlamydophila pneumoniae NOT DETECTED NOT DETECTED Final   Mycoplasma pneumoniae NOT DETECTED NOT DETECTED Final    Comment: Performed at [facility], [place] [address], [place],  [address]    RADIOLOGY:  No results found.   Management plans discussed with the patient, family and they are in agreement.  CODE STATUS:     Code Status Orders  (From admission, onward)        Start     Ordered   01/10/18 1108  Full code  Continuous     01/10/18 1107    Code Status History    This patient has a current code status but no historical code status.      TOTAL TIME TAKING CARE OF THIS PATIENT: *40* minutes.    Fritzi Mandes M.D on 01/12/2018 at 10:30 AM  Between 7am to 6pm - Pager - [phone] After 6pm go to [email or web address] - password [email or web address] [facility]  Office  [phone]  CC: Primary care physician; Patient, No Pcp Per

## 2018-01-13 LAB — CULTURE, BLOOD (ROUTINE X 2)
Culture: NO GROWTH
Culture: NO GROWTH
Special Requests: ADEQUATE
Special Requests: ADEQUATE

## 2021-01-17 ENCOUNTER — Other Ambulatory Visit: Payer: Self-pay

## 2021-01-17 ENCOUNTER — Emergency Department: Payer: Self-pay

## 2021-01-17 ENCOUNTER — Inpatient Hospital Stay
Admission: EM | Admit: 2021-01-17 | Discharge: 2021-01-20 | DRG: 439 | Disposition: A | Discharge status: Home / Self Care | Payer: Self-pay | Attending: Family Medicine | Admitting: Family Medicine

## 2021-01-17 DIAGNOSIS — K859 Acute pancreatitis without necrosis or infection, unspecified: Secondary | ICD-10-CM | POA: Diagnosis present

## 2021-01-17 DIAGNOSIS — R7989 Other specified abnormal findings of blood chemistry: Secondary | ICD-10-CM

## 2021-01-17 DIAGNOSIS — R7401 Elevation of levels of liver transaminase levels: Secondary | ICD-10-CM | POA: Diagnosis present

## 2021-01-17 DIAGNOSIS — R1013 Epigastric pain: Secondary | ICD-10-CM

## 2021-01-17 DIAGNOSIS — D6959 Other secondary thrombocytopenia: Secondary | ICD-10-CM | POA: Diagnosis present

## 2021-01-17 DIAGNOSIS — K7689 Other specified diseases of liver: Secondary | ICD-10-CM | POA: Diagnosis present

## 2021-01-17 DIAGNOSIS — F1010 Alcohol abuse, uncomplicated: Secondary | ICD-10-CM

## 2021-01-17 DIAGNOSIS — F1090 Alcohol use, unspecified, uncomplicated: Secondary | ICD-10-CM | POA: Diagnosis present

## 2021-01-17 DIAGNOSIS — D696 Thrombocytopenia, unspecified: Secondary | ICD-10-CM

## 2021-01-17 DIAGNOSIS — Z20822 Contact with and (suspected) exposure to covid-19: Secondary | ICD-10-CM | POA: Diagnosis present

## 2021-01-17 DIAGNOSIS — F17210 Nicotine dependence, cigarettes, uncomplicated: Secondary | ICD-10-CM | POA: Diagnosis present

## 2021-01-17 DIAGNOSIS — K852 Alcohol induced acute pancreatitis without necrosis or infection: Principal | ICD-10-CM | POA: Diagnosis present

## 2021-01-17 DIAGNOSIS — F10932 Alcohol use, unspecified with withdrawal with perceptual disturbance: Secondary | ICD-10-CM | POA: Diagnosis present

## 2021-01-17 DIAGNOSIS — K760 Fatty (change of) liver, not elsewhere classified: Secondary | ICD-10-CM | POA: Diagnosis present

## 2021-01-17 DIAGNOSIS — Z720 Tobacco use: Secondary | ICD-10-CM | POA: Diagnosis present

## 2021-01-17 DIAGNOSIS — F10230 Alcohol dependence with withdrawal, uncomplicated: Secondary | ICD-10-CM | POA: Diagnosis present

## 2021-01-17 DIAGNOSIS — F17200 Nicotine dependence, unspecified, uncomplicated: Secondary | ICD-10-CM | POA: Diagnosis present

## 2021-01-17 LAB — COMPREHENSIVE METABOLIC PANEL
ALT: 114 U/L — ABNORMAL HIGH (ref 0–44)
AST: 236 U/L — ABNORMAL HIGH (ref 15–41)
Albumin: 4 g/dL (ref 3.5–5.0)
Alkaline Phosphatase: 83 U/L (ref 38–126)
Anion gap: 8 (ref 5–15)
BUN: 7 mg/dL (ref 6–20)
CO2: 29 mmol/L (ref 22–32)
Calcium: 9.4 mg/dL (ref 8.9–10.3)
Chloride: 100 mmol/L (ref 98–111)
Creatinine, Ser: 0.7 mg/dL (ref 0.61–1.24)
GFR, Estimated: >60 mL/min (ref >60)
Glucose, Bld: 144 mg/dL — ABNORMAL HIGH (ref 70–99)
Potassium: 4.1 mmol/L (ref 3.5–5.1)
Sodium: 137 mmol/L (ref 135–145)
Total Bilirubin: 1.6 mg/dL — ABNORMAL HIGH (ref 0.3–1.2)
Total Protein: 8.8 g/dL — ABNORMAL HIGH (ref 6.5–8.1)

## 2021-01-17 LAB — CBC
HCT: 45.1 % (ref 39.0–52.0)
Hemoglobin: 16.2 g/dL (ref 13.0–17.0)
MCH: 34.4 pg — ABNORMAL HIGH (ref 26.0–34.0)
MCHC: 35.9 g/dL (ref 30.0–36.0)
MCV: 95.8 fL (ref 80.0–100.0)
Platelets: 171 10*3/uL (ref 150–400)
RBC: 4.71 MIL/uL (ref 4.22–5.81)
RDW: 12 % (ref 11.5–15.5)
WBC: 9.6 10*3/uL (ref 4.0–10.5)
nRBC: 0 % (ref 0.0–0.2)

## 2021-01-17 LAB — RESP PANEL BY RT-PCR (FLU A&B, COVID) ARPGX2
Influenza A by PCR: NEGATIVE
Influenza B by PCR: NEGATIVE
SARS Coronavirus 2 by RT PCR: NEGATIVE

## 2021-01-17 LAB — ETHANOL: Alcohol, Ethyl (B): <10 mg/dL (ref <10)

## 2021-01-17 LAB — MAGNESIUM: Magnesium: 1.4 mg/dL — ABNORMAL LOW (ref 1.7–2.4)

## 2021-01-17 LAB — URINALYSIS, COMPLETE (UACMP) WITH MICROSCOPIC
Bilirubin Urine: NEGATIVE
Glucose, UA: NEGATIVE mg/dL
Hgb urine dipstick: NEGATIVE
Ketones, ur: NEGATIVE mg/dL
Leukocytes,Ua: NEGATIVE
Nitrite: NEGATIVE
Protein, ur: NEGATIVE mg/dL
Specific Gravity, Urine: >1.046 — ABNORMAL HIGH (ref 1.005–1.030)
Squamous Epithelial / LPF: NONE SEEN (ref 0–5)
pH: 6 (ref 5.0–8.0)

## 2021-01-17 LAB — LIPASE, BLOOD: Lipase: 321 U/L — ABNORMAL HIGH (ref 11–51)

## 2021-01-17 LAB — HIV ANTIBODY (ROUTINE TESTING W REFLEX): HIV Screen 4th Generation wRfx: NONREACTIVE

## 2021-01-17 LAB — PHOSPHORUS: Phosphorus: 3 mg/dL (ref 2.5–4.6)

## 2021-01-17 MED ORDER — ONDANSETRON HCL 4 MG/2ML IJ SOLN: 4.0000 mg | Freq: Four times a day (QID) | INTRAMUSCULAR | Status: DC | PRN

## 2021-01-17 MED ORDER — HYDROMORPHONE HCL 1 MG/ML IJ SOLN
1.0000 mg | Freq: Once | INTRAMUSCULAR | Status: AC
  Administered 2021-01-17: 1 mg via INTRAVENOUS
  Filled 2021-01-17: qty 1

## 2021-01-17 MED ORDER — THIAMINE HCL 100 MG/ML IJ SOLN
Freq: Once | INTRAVENOUS | Status: DC
  Filled 2021-01-17: qty 1000

## 2021-01-17 MED ORDER — ONDANSETRON HCL 4 MG PO TABS: 4.0000 mg | ORAL_TABLET | Freq: Four times a day (QID) | ORAL | Status: DC | PRN

## 2021-01-17 MED ORDER — ONDANSETRON HCL 4 MG/2ML IJ SOLN
4.0000 mg | Freq: Once | INTRAMUSCULAR | Status: AC
  Administered 2021-01-17: 4 mg via INTRAVENOUS
  Filled 2021-01-17: qty 2

## 2021-01-17 MED ORDER — ENOXAPARIN SODIUM 40 MG/0.4ML IJ SOSY
40.0000 mg | PREFILLED_SYRINGE | INTRAMUSCULAR | Status: DC
  Filled 2021-01-17 (×2): qty 0.4

## 2021-01-17 MED ORDER — LORAZEPAM 1 MG PO TABS: 1.0000 mg | ORAL_TABLET | ORAL | Status: AC | PRN

## 2021-01-17 MED ORDER — MORPHINE SULFATE (PF) 2 MG/ML IV SOLN
2.0000 mg | INTRAVENOUS | Status: DC | PRN
  Administered 2021-01-17 – 2021-01-19 (×10): 2 mg via INTRAVENOUS
  Filled 2021-01-17 (×10): qty 1

## 2021-01-17 MED ORDER — LACTATED RINGERS IV BOLUS
1000.0000 mL | Freq: Once | INTRAVENOUS | Status: AC
  Administered 2021-01-17: 1000 mL via INTRAVENOUS

## 2021-01-17 MED ORDER — IOHEXOL 300 MG/ML  SOLN
100.0000 mL | Freq: Once | INTRAMUSCULAR | Status: AC | PRN
  Administered 2021-01-17: 100 mL via INTRAVENOUS

## 2021-01-17 MED ORDER — LORAZEPAM 2 MG/ML IJ SOLN: 1.0000 mg | INTRAMUSCULAR | Status: AC | PRN

## 2021-01-17 MED ORDER — THIAMINE HCL 100 MG/ML IJ SOLN
Freq: Once | INTRAVENOUS | Status: AC
  Filled 2021-01-17: qty 1000

## 2021-01-17 MED ORDER — SODIUM CHLORIDE 0.9 % IV SOLN: INTRAVENOUS | Status: DC

## 2021-01-17 MED ORDER — FENTANYL CITRATE (PF) 100 MCG/2ML IJ SOLN
50.0000 ug | Freq: Once | INTRAMUSCULAR | Status: AC
  Administered 2021-01-17: 50 ug via INTRAVENOUS
  Filled 2021-01-17: qty 2

## 2021-01-17 NOTE — ED Triage Notes (Signed)
Pt c/o upper abd pain since 4am with N/V , pt has a hx of pancreatitis.

## 2021-01-17 NOTE — ED Provider Notes (Signed)
[facility] Emergency Department Provider Note   ____________________________________________   Event Date/Time   First MD Initiated Contact with Patient 01/17/21 [phone]     (approximate)  I have reviewed the triage vital signs and the nursing notes.   HISTORY  Chief Complaint Abdominal Pain    HPI Luke Rogers. is a 60 y.o. male who presents for epigastric abdominal pain  LOCATION: Epigastric abdomen DURATION: 5 hours TIMING: Worsening since onset SEVERITY: 9 QUALITY: Burning CONTEXT: Patient states he is an everyday drinker and has had pancreatitis due to alcohol in the past MODIFYING FACTORS: Food worsens this pain.  No relieving factors ASSOCIATED SYMPTOMS: Nausea/vomiting   Per medical record review patient has history of alcohol abuse, hepatitis C, and alcoholic pancreatitis in the past          Past Medical History:  Diagnosis Date   Alcohol abuse    Hepatitis C    Pancreatitis     Patient Active Problem List   Diagnosis Date Noted   Acute alcoholic pancreatitis 41/28/7867   Nicotine dependence 01/17/2021   Transaminitis 01/17/2021   Malnutrition of moderate degree 01/09/2018   Acute purulent meningitis 01/08/2018   Alcohol dependence with uncomplicated withdrawal ([place]) 02/16/2015    Past Surgical History:  Procedure Laterality Date   FOOT SURGERY Bilateral    As infant   WRIST SURGERY Right     Prior to Admission medications   Medication Sig Start Date End Date Taking? Authorizing Provider  ibuprofen (ADVIL) 200 MG tablet Take 400-600 mg by mouth every 6 (six) hours as needed for mild pain or moderate pain.   Yes [provider]    Allergies Patient has no known allergies.  Family History  Problem Relation Age of Onset   Hypertension Mother     Social History Social History   Tobacco Use   Smoking status: Every Day    Packs/day: 1.00    Pack years: 0.00    Types: Cigarettes   Smokeless  tobacco: Never  Substance Use Topics   Alcohol use: Yes   Drug use: No    Review of Systems Constitutional: No fever/chills Eyes: No visual changes. ENT: No sore throat. Cardiovascular: Denies chest pain. Respiratory: Denies shortness of breath. Gastrointestinal: Endorses epigastric abdominal pain, nausea, and vomiting.  No diarrhea. Genitourinary: Negative for dysuria. Musculoskeletal: Negative for acute arthralgias Skin: Negative for rash. Neurological: Negative for headaches, weakness/numbness/paresthesias in any extremity Psychiatric: Negative for suicidal ideation/homicidal ideation   ____________________________________________   PHYSICAL EXAM:  VITAL SIGNS: ED Triage Vitals  Enc Vitals Group     BP 01/17/21 0835 126/88     Pulse Rate 01/17/21 0835 79     Resp 01/17/21 0835 18     Temp --      Temp src --      SpO2 01/17/21 0835 96 %     Weight 01/17/21 0838 180 lb (81.6 kg)     Height 01/17/21 0831 6\' 3"  (1.905 m)     Head Circumference --      Peak Flow --      Pain Score 01/17/21 0831 9     Pain Loc --      Pain Edu? --      Excl. in [place]? --    Constitutional: Alert and oriented. Well appearing and in no acute distress. Eyes: Conjunctivae are normal. PERRL. Head: Atraumatic. Nose: No congestion/rhinnorhea. Mouth/Throat: Mucous membranes are moist. Neck: No stridor Cardiovascular: Grossly normal heart  sounds.  Good peripheral circulation. Respiratory: Normal respiratory effort.  No retractions. Gastrointestinal: Soft and epigastric tenderness to palpation. No distention. Musculoskeletal: No obvious deformities Neurologic:  Normal speech and language. No gross focal neurologic deficits are appreciated. Skin:  Skin is warm and dry. No rash noted. Psychiatric: Mood and affect are normal. Speech and behavior are normal.  ____________________________________________   LABS (all labs ordered are listed, but only abnormal results are displayed)  Labs  Reviewed  LIPASE, BLOOD - Abnormal; Notable for the following components:      Result Value   Lipase 321 (*)    All other components within normal limits  COMPREHENSIVE METABOLIC PANEL - Abnormal; Notable for the following components:   Glucose, Bld 144 (*)    Total Protein 8.8 (*)    AST 236 (*)    ALT 114 (*)    Total Bilirubin 1.6 (*)    All other components within normal limits  CBC - Abnormal; Notable for the following components:   MCH 34.4 (*)    All other components within normal limits  URINALYSIS, COMPLETE (UACMP) WITH MICROSCOPIC - Abnormal; Notable for the following components:   Color, Urine AMBER (*)    APPearance CLEAR (*)    Specific Gravity, Urine >1.046 (*)    Bacteria, UA RARE (*)    All other components within normal limits  MAGNESIUM - Abnormal; Notable for the following components:   Magnesium 1.4 (*)    All other components within normal limits  BASIC METABOLIC PANEL - Abnormal; Notable for the following components:   Calcium 8.1 (*)    All other components within normal limits  CBC - Abnormal; Notable for the following components:   RBC 4.10 (*)    MCH 35.1 (*)    MCHC 36.2 (*)    Platelets 138 (*)    All other components within normal limits  RESP PANEL BY RT-PCR (FLU A&B, COVID) ARPGX2  ETHANOL  PHOSPHORUS  HIV ANTIBODY (ROUTINE TESTING W REFLEX)   ____________________________________________  EKG  ED ECG REPORT I, Naaman Plummer, the attending physician, personally viewed and interpreted this ECG.  Date: 01/17/2021 EKG Time: 0839 Rate: 62 Rhythm: normal sinus rhythm QRS Axis: normal Intervals: normal ST/T Wave abnormalities: normal Narrative Interpretation: no evidence of acute ischemia  ____________________________________________  RADIOLOGY  ED MD interpretation: CT of the abdomen pelvis with IV contrast shows acute pancreatitis with mild peripancreatic fluid without any drainable fluid collections or convincing pancreatic  necrosis  Official radiology report(s): CT Abdomen Pelvis W Contrast  Result Date: 01/17/2021 CLINICAL DATA:  Upper abdominal pain since 4 a.m., nausea/vomiting, elevated lipase, history of pancreatitis EXAM: CT ABDOMEN AND PELVIS WITH CONTRAST TECHNIQUE: Multidetector CT imaging of the abdomen and pelvis was performed using the standard protocol following bolus administration of intravenous contrast. CONTRAST:  192mL OMNIPAQUE IOHEXOL 300 MG/ML  SOLN COMPARISON:  None. FINDINGS: Lower chest: Mild subpleural scarring. Hepatobiliary: Two subcentimeter cyst in the left hepatic lobe. Gallbladder is unremarkable. No intrahepatic or extrahepatic duct dilatation Pancreas: Peripancreatic fluid/inflammatory changes, with subtle hypoenhancement along the pancreatic body (series 2/image 32), but no frank necrosis or hemorrhage. No drainable fluid collection/walled-off necrosis. Parenchymal calcification in the pancreatic head/uncinate process (series 2/image 35) likely reflects sequela of prior/chronic pancreatitis. No pancreatic ductal dilatation. Spleen: Within normal limits. Adrenals/Urinary Tract: Adrenal glands are within normal limits. Bilateral renal cysts, including in 18 mm hemorrhagic cyst along the left upper kidney (series 2/image 23) and a 4.1 cm left upper pole renal  sinus cyst (series 2/image 37). No hydronephrosis. Bladder is within normal limits. Stomach/Bowel: Stomach is notable for a moderate hiatal hernia with trace fluid. No evidence of bowel obstruction. Appendix is not discretely visualized. Left colon is decompressed. Vascular/Lymphatic: No evidence of abdominal aortic aneurysm. Atherosclerotic calcifications of the abdominal aorta and branch vessels. No suspicious abdominopelvic lymphadenopathy. Reproductive: Prostate is grossly unremarkable. Other: No abdominopelvic ascites. Musculoskeletal: Mild degenerative changes of the visualized thoracolumbar spine. IMPRESSION: Acute pancreatitis with mild  peripancreatic fluid. No convincing pancreatic necrosis. No hemorrhage or drainable fluid collection/walled-off necrosis. Electronically Signed   By: Julian Hy M.D.   On: 01/17/2021 10:57    ____________________________________________   PROCEDURES  Procedure(s) performed (including Critical Care):  .1-3 Lead EKG Interpretation  Date/Time: 01/18/2021 8:12 AM Performed by: Naaman Plummer, MD Authorized by: Naaman Plummer, MD     Interpretation: normal     ECG rate:  64   ECG rate assessment: normal     Rhythm: sinus rhythm     Ectopy: none     Conduction: normal     ____________________________________________   INITIAL IMPRESSION / ASSESSMENT AND PLAN / ED COURSE  As part of my medical decision making, I reviewed the following data within the electronic medical record, if available:  Nursing notes reviewed and incorporated, Labs reviewed, EKG interpreted, Old chart reviewed, Radiograph reviewed and Notes from prior ED visits reviewed and incorporated      Given history and exam I have a low suspicion for AAA, SBO, appendicitis, mesenteric ischemia, nephrolithiasis, pyelonephritis, or diverticulitis. I have a moderate concern for pancreatitis, gastritis vs non-bleeding peptic ulcer, or hepatobiliary disease and thus will obtain labs and imaging.  ED Workup: CBC, BMP, LFTs, Lipase, LDH  No signs of severe pancreatitis on exam such as ecchymosis of periumbilical region or flanks, hypoxia, or tachypneia.  Disposition: Admit for bowel rest, continued analgesia, monitoring.      ____________________________________________   FINAL CLINICAL IMPRESSION(S) / ED DIAGNOSES  Final diagnoses:  Epigastric pain  Alcohol-induced acute pancreatitis without infection or necrosis     ED Discharge Orders     None        Note:  This document was prepared using Dragon voice recognition software and may include unintentional dictation errors.    Naaman Plummer,  MD 01/18/21 [phone]

## 2021-01-17 NOTE — H&P (Signed)
History and Physical    Luke Rogers. QAS:[id number] DOB: [birth year] DOA: 01/17/2021  PCP: Patient, No Pcp Per (Inactive)   Patient coming from: Home  I have personally briefly reviewed patient's old medical records in [place]  Chief Complaint: Abdominal pain  HPI: Luke Rogers. is a 60 y.o. male with medical history significant for alcohol and nicotine dependence who presents to the ER for evaluation of abdominal pain mostly in the epigastrium.  Pain started acutely at about 4 AM on the day of his admission and was associated with nausea and multiple episodes of emesis.  He admits to daily alcohol use and has a history of alcoholic pancreatitis.  He rates his pain a 10 x 10 in intensity at its worst. He denies having any chest pain, no shortness of breath, no dizziness, no lightheadedness, no palpitations, no diaphoresis, no headache, no urinary symptoms, no changes in his bowel habits, no fever, no chills, no cough, no blurred vision or focal deficits. Labs show sodium 137, potassium 4.4, chloride 100, bicarb 29, glucose 144, BUN 7, creatinine 0.70, calcium 9.4, alkaline phosphatase 83, albumin 4.0, lipase 329 AST 236, ALT 114, total protein 8.8, total bilirubin 1.6, white count 9.6, hemoglobin 16.2, hematocrit 45.1, MCV 95.8, RDW 12.0, platelet count 171 Respiratory viral panel is pending CT scan of abdomen and pelvis shows acute pancreatitis with mild peripancreatic fluid. No convincing pancreatic necrosis. No hemorrhage or drainable fluid collection/walled-off necrosis. Twelve-lead EKG reviewed by me shows normal sinus rhythm  ED Course: Patient is a 60 year old male with a history of alcohol and nicotine dependence who presents to the ER for evaluation of abdominal pain associated with nausea and vomiting.  CT scan of abdomen pelvis shows acute pancreatitis.  Patient has elevated lipase levels and transaminitis.  He will be admitted to the hospital for further  evaluation.   Review of Systems: As per HPI otherwise all other systems reviewed and negative.    Past Medical History:  Diagnosis Date   Alcohol abuse    Hepatitis C    Pancreatitis     Past Surgical History:  Procedure Laterality Date   FOOT SURGERY Bilateral    As infant   WRIST SURGERY Right      reports that he has been smoking cigarettes. He has been smoking an average of 1.00 packs per day. He has never used smokeless tobacco. He reports current alcohol use. He reports that he does not use drugs.  No Known Allergies  Family History  Problem Relation Age of Onset   Hypertension Mother       Prior to Admission medications   Medication Sig Start Date End Date Taking? Authorizing Provider  doxycycline (VIBRA-TABS) 100 MG tablet Take 1 tablet (100 mg total) by mouth every 12 (twelve) hours. 01/12/18   Fritzi Mandes, MD  Multiple Vitamin (MULTIVITAMIN WITH MINERALS) TABS tablet Take 1 tablet by mouth daily. 01/13/18   Fritzi Mandes, MD    Physical Exam: Vitals:   01/17/21 0835 01/17/21 0838 01/17/21 1009 01/17/21 1013  BP: 126/88  134/84 134/84  Pulse: 79  (!) 56 (!) 53  Resp: 18  (!) 22   SpO2: 96%  95%   Weight:  81.6 kg    Height:         Vitals:   01/17/21 0835 01/17/21 0838 01/17/21 1009 01/17/21 1013  BP: 126/88  134/84 134/84  Pulse: 79  (!) 56 (!) 53  Resp: 18  (!) 22  SpO2: 96%  95%   Weight:  81.6 kg    Height:          Constitutional: Alert and oriented x 3 .  Appears uncomfortable and in painful distress HEENT:      Head: Normocephalic and atraumatic.         Eyes: PERLA, EOMI, Conjunctivae are normal. Sclera is non-icteric.       Mouth/Throat: Mucous membranes are dry      Neck: Supple with no signs of meningismus. Cardiovascular: Regular rate and rhythm. No murmurs, gallops, or rubs. 2+ symmetrical distal pulses are present . No JVD. No LE edema Respiratory: Respiratory effort normal .Lungs sounds clear bilaterally. No wheezes, crackles, or  rhonchi.  Gastrointestinal: Soft, diffuse tenderness mostly in the epigastrium, and non distended with positive bowel sounds.  Genitourinary: No CVA tenderness. Musculoskeletal: Nontender with normal range of motion in all extremities. No cyanosis, or erythema of extremities. Neurologic:  Face is symmetric. Moving all extremities. No gross focal neurologic deficits . Skin: Skin is warm, dry.  No rash or ulcers Psychiatric: Mood and affect are normal    Labs on Admission: I have personally reviewed following labs and imaging studies  CBC: Recent Labs  Lab 01/17/21 0820  WBC 9.6  HGB 16.2  HCT 45.1  MCV 95.8  PLT 413   Basic Metabolic Panel: Recent Labs  Lab 01/17/21 0820  NA 137  K 4.1  CL 100  CO2 29  GLUCOSE 144*  BUN 7  CREATININE 0.70  CALCIUM 9.4   GFR: Estimated Creatinine Clearance: 117.6 mL/min (by C-G formula based on SCr of 0.7 mg/dL). Liver Function Tests: Recent Labs  Lab 01/17/21 0820  AST 236*  ALT 114*  ALKPHOS 83  BILITOT 1.6*  PROT 8.8*  ALBUMIN 4.0   Recent Labs  Lab 01/17/21 0820  LIPASE 321*   No results for input(s): AMMONIA in the last 168 hours. Coagulation Profile: No results for input(s): INR, PROTIME in the last 168 hours. Cardiac Enzymes: No results for input(s): CKTOTAL, CKMB, CKMBINDEX, TROPONINI in the last 168 hours. BNP (last 3 results) No results for input(s): PROBNP in the last 8760 hours. HbA1C: No results for input(s): HGBA1C in the last 72 hours. CBG: No results for input(s): GLUCAP in the last 168 hours. Lipid Profile: No results for input(s): CHOL, HDL, LDLCALC, TRIG, CHOLHDL, LDLDIRECT in the last 72 hours. Thyroid Function Tests: No results for input(s): TSH, T4TOTAL, FREET4, T3FREE, THYROIDAB in the last 72 hours. Anemia Panel: No results for input(s): VITAMINB12, FOLATE, FERRITIN, TIBC, IRON, RETICCTPCT in the last 72 hours. Urine analysis:    Component Value Date/Time   COLORURINE YELLOW (A) 01/08/2018  1819   APPEARANCEUR CLEAR (A) 01/08/2018 1819   LABSPEC 1.023 01/08/2018 1819   PHURINE 5.0 01/08/2018 1819   GLUCOSEU NEGATIVE 01/08/2018 1819   HGBUR NEGATIVE 01/08/2018 1819   BILIRUBINUR NEGATIVE 01/08/2018 1819   KETONESUR NEGATIVE 01/08/2018 1819   PROTEINUR NEGATIVE 01/08/2018 1819   NITRITE NEGATIVE 01/08/2018 1819   LEUKOCYTESUR NEGATIVE 01/08/2018 1819    Radiological Exams on Admission: CT Abdomen Pelvis W Contrast  Result Date: 01/17/2021 CLINICAL DATA:  Upper abdominal pain since 4 a.m., nausea/vomiting, elevated lipase, history of pancreatitis EXAM: CT ABDOMEN AND PELVIS WITH CONTRAST TECHNIQUE: Multidetector CT imaging of the abdomen and pelvis was performed using the standard protocol following bolus administration of intravenous contrast. CONTRAST:  115mL OMNIPAQUE IOHEXOL 300 MG/ML  SOLN COMPARISON:  None. FINDINGS: Lower chest: Mild subpleural scarring. Hepatobiliary:  Two subcentimeter cyst in the left hepatic lobe. Gallbladder is unremarkable. No intrahepatic or extrahepatic duct dilatation Pancreas: Peripancreatic fluid/inflammatory changes, with subtle hypoenhancement along the pancreatic body (series 2/image 32), but no frank necrosis or hemorrhage. No drainable fluid collection/walled-off necrosis. Parenchymal calcification in the pancreatic head/uncinate process (series 2/image 35) likely reflects sequela of prior/chronic pancreatitis. No pancreatic ductal dilatation. Spleen: Within normal limits. Adrenals/Urinary Tract: Adrenal glands are within normal limits. Bilateral renal cysts, including in 18 mm hemorrhagic cyst along the left upper kidney (series 2/image 23) and a 4.1 cm left upper pole renal sinus cyst (series 2/image 37). No hydronephrosis. Bladder is within normal limits. Stomach/Bowel: Stomach is notable for a moderate hiatal hernia with trace fluid. No evidence of bowel obstruction. Appendix is not discretely visualized. Left colon is decompressed.  Vascular/Lymphatic: No evidence of abdominal aortic aneurysm. Atherosclerotic calcifications of the abdominal aorta and branch vessels. No suspicious abdominopelvic lymphadenopathy. Reproductive: Prostate is grossly unremarkable. Other: No abdominopelvic ascites. Musculoskeletal: Mild degenerative changes of the visualized thoracolumbar spine. IMPRESSION: Acute pancreatitis with mild peripancreatic fluid. No convincing pancreatic necrosis. No hemorrhage or drainable fluid collection/walled-off necrosis. Electronically Signed   By: Julian Hy M.D.   On: 01/17/2021 10:57     Assessment/Plan Principal Problem:   Acute alcoholic pancreatitis Active Problems:   Alcohol dependence with uncomplicated withdrawal ([place])   Nicotine dependence   Transaminitis     Acute alcoholic pancreatitis Patient has a history of alcohol dependence who presents for evaluation of epigastric pain associated episodes of nausea and vomiting. Patient has elevated lipase level and CT scan shows evidence of acute pancreatitis Keep patient n.p.o. for now Supportive care with IV fluid resuscitation, pain control and antiemetics.    Alcohol dependence with increased risk of alcohol withdrawal symptoms Place patient on CIWA protocol and administer lorazepam for CIWA score of 8 or greater Patient has been advised to abstain from further alcohol use    Nicotine dependence Smoking cessation has been discussed with patient in detail He declines a nicotine transdermal patch at this time    Transaminitis Related to alcohol dependence Monitor liver enzymes closely    DVT prophylaxis: Lovenox  Code Status: full code  Family Communication: Greater than 50% of time was spent discussing patient's condition and plan of care with him and his girlfriend at the bedside.  All questions and concerns have been addressed.  They verbalized understanding and agree with the plan Disposition Plan: Back to previous home  environment Consults called: none  Status: At the time of admission, it appears that the appropriate admission status for this patient is inpatient. This is judged to be reasonable and necessary to provide the required intensity of service to ensure the patient's safety given the presenting symptoms, physical exam findings, and initial radiographic and laboratory data in the context of their comorbid conditions. Patient requires inpatient status due to high intensity of service, high risk for further deterioration and high frequency of surveillance required.    Collier Bullock MD [facility]     01/17/2021, 11:49 AM

## 2021-01-17 NOTE — ED Notes (Signed)
Provided mouth swabs to pt.

## 2021-01-17 NOTE — ED Notes (Signed)
Red top drawn and sent to lab for alcohol level.

## 2021-01-17 NOTE — ED Notes (Signed)
Informed RN bed assigned 

## 2021-01-17 NOTE — ED Notes (Signed)
Pt presents with generalized abdominal pain for several days. Hx of alcoholism and states he consumes ~8 beers a day and his last drink was at 2200 on 6/26. Current CIWA of a 4.

## 2021-01-17 NOTE — ED Notes (Addendum)
Pt to ED c/o severe diffuse abdominal pain since 0400 today. Has dx of chronic pancreatitis since 65yr ago. Wife at bedside. Pt is daily beer drinker, 8-9 beers/day, last drink was about 1030pm last night.

## 2021-01-17 NOTE — ED Notes (Signed)
Pt to CT

## 2021-01-17 NOTE — ED Notes (Signed)
Dr Cheri Fowler at bedside.

## 2021-01-18 DIAGNOSIS — R7989 Other specified abnormal findings of blood chemistry: Secondary | ICD-10-CM

## 2021-01-18 DIAGNOSIS — F1010 Alcohol abuse, uncomplicated: Secondary | ICD-10-CM

## 2021-01-18 DIAGNOSIS — Z720 Tobacco use: Secondary | ICD-10-CM

## 2021-01-18 DIAGNOSIS — D696 Thrombocytopenia, unspecified: Secondary | ICD-10-CM

## 2021-01-18 LAB — CBC
HCT: 39.8 % (ref 39.0–52.0)
Hemoglobin: 14.4 g/dL (ref 13.0–17.0)
MCH: 35.1 pg — ABNORMAL HIGH (ref 26.0–34.0)
MCHC: 36.2 g/dL — ABNORMAL HIGH (ref 30.0–36.0)
MCV: 97.1 fL (ref 80.0–100.0)
Platelets: 138 10*3/uL — ABNORMAL LOW (ref 150–400)
RBC: 4.1 MIL/uL — ABNORMAL LOW (ref 4.22–5.81)
RDW: 12.2 % (ref 11.5–15.5)
WBC: 9.3 10*3/uL (ref 4.0–10.5)
nRBC: 0 % (ref 0.0–0.2)

## 2021-01-18 LAB — BASIC METABOLIC PANEL
Anion gap: 7 (ref 5–15)
BUN: 10 mg/dL (ref 6–20)
CO2: 29 mmol/L (ref 22–32)
Calcium: 8.1 mg/dL — ABNORMAL LOW (ref 8.9–10.3)
Chloride: 102 mmol/L (ref 98–111)
Creatinine, Ser: 0.64 mg/dL (ref 0.61–1.24)
GFR, Estimated: >60 mL/min (ref >60)
Glucose, Bld: 92 mg/dL (ref 70–99)
Potassium: 3.6 mmol/L (ref 3.5–5.1)
Sodium: 138 mmol/L (ref 135–145)

## 2021-01-18 MED ORDER — OXYCODONE HCL 5 MG PO TABS
5.0000 mg | ORAL_TABLET | ORAL | Status: DC | PRN
  Administered 2021-01-18 – 2021-01-20 (×8): 5 mg via ORAL
  Filled 2021-01-18 (×8): qty 1

## 2021-01-18 MED ORDER — MAGNESIUM SULFATE 2 GM/50ML IV SOLN
2.0000 g | Freq: Once | INTRAVENOUS | Status: AC
  Administered 2021-01-18: 2 g via INTRAVENOUS
  Filled 2021-01-18: qty 50

## 2021-01-18 MED ORDER — FOLIC ACID 1 MG PO TABS
1.0000 mg | ORAL_TABLET | Freq: Every day | ORAL | Status: DC
  Administered 2021-01-18 – 2021-01-20 (×3): 1 mg via ORAL
  Filled 2021-01-18 (×3): qty 1

## 2021-01-18 MED ORDER — ADULT MULTIVITAMIN W/MINERALS CH
1.0000 | ORAL_TABLET | Freq: Every day | ORAL | Status: DC
  Administered 2021-01-18 – 2021-01-20 (×3): 1 via ORAL
  Filled 2021-01-18 (×3): qty 1

## 2021-01-18 MED ORDER — THIAMINE HCL 100 MG PO TABS
100.0000 mg | ORAL_TABLET | Freq: Every day | ORAL | Status: DC
  Administered 2021-01-18 – 2021-01-20 (×3): 100 mg via ORAL
  Filled 2021-01-18 (×3): qty 1

## 2021-01-18 NOTE — Progress Notes (Addendum)
Patient ID: Luke Rogers., male   DOB: [birth year], 60 y.o.   MRN: [id number] [facility] PROGRESS NOTE  Luke Rogers. FGH:[id number] DOB: [birth year] DOA: 01/17/2021 PCP: Patient, No Pcp Per (Inactive)  HPI/Subjective: Patient came in with abdominal pain and found to have acute pancreatitis on CT scan.  His lipase was also elevated.  Still having some abdominal pain today but less than yesterday.  No nausea or vomiting.  No diarrhea.  Has history of alcohol abuse and previous pancreatitis in the past.  Pain today describes 6.5 out of 10 in intensity.  Objective: Vitals:   01/18/21 0503 01/18/21 0752  BP: 115/82 111/77  Pulse: 69 62  Resp: 20 16  Temp: 98 F (36.7 C) 98.1 F (36.7 C)  SpO2: 97% 95%    Intake/Output Summary (Last 24 hours) at 01/18/2021 1441 Last data filed at 01/18/2021 1300 Gross per 24 hour  Intake 1170 ml  Output 0 ml  Net 1170 ml   Filed Weights   01/17/21 0838 01/17/21 2133  Weight: 81.6 kg 77.5 kg    ROS: Review of Systems  Respiratory:  Negative for cough and shortness of breath.   Cardiovascular:  Negative for chest pain.  Gastrointestinal:  Positive for abdominal pain. Negative for diarrhea, nausea and vomiting.  Exam: Physical Exam HENT:     Head: Normocephalic.     Mouth/Throat:     Pharynx: No oropharyngeal exudate.  Eyes:     General: Lids are normal.     Conjunctiva/sclera: Conjunctivae normal.     Pupils: Pupils are equal, round, and reactive to light.  Cardiovascular:     Rate and Rhythm: Normal rate and regular rhythm.     Heart sounds: Normal heart sounds, S1 normal and S2 normal.  Pulmonary:     Breath sounds: Normal breath sounds. No decreased breath sounds, wheezing, rhonchi or rales.  Abdominal:     Palpations: Abdomen is soft.     Tenderness: There is abdominal tenderness in the epigastric area.  Musculoskeletal:     Right ankle: No swelling.     Left ankle: No swelling.  Skin:    General: Skin is  warm.     Findings: No rash.  Neurological:     Mental Status: He is alert and oriented to person, place, and time.     Data Reviewed: Basic Metabolic Panel: Recent Labs  Lab 01/17/21 0820 01/17/21 1120 01/18/21 0420  NA 137  --  138  K 4.1  --  3.6  CL 100  --  102  CO2 29  --  29  GLUCOSE 144*  --  92  BUN 7  --  10  CREATININE 0.70  --  0.64  CALCIUM 9.4  --  8.1*  MG  --  1.4*  --   PHOS  --  3.0  --    Liver Function Tests: Recent Labs  Lab 01/17/21 0820  AST 236*  ALT 114*  ALKPHOS 83  BILITOT 1.6*  PROT 8.8*  ALBUMIN 4.0   Recent Labs  Lab 01/17/21 0820  LIPASE 321*    CBC: Recent Labs  Lab 01/17/21 0820 01/18/21 0420  WBC 9.6 9.3  HGB 16.2 14.4  HCT 45.1 39.8  MCV 95.8 97.1  PLT 171 138*     Recent Results (from the past 240 hour(s))  Resp Panel by RT-PCR (Flu A&B, Covid) Nasopharyngeal Swab     Status: None   Collection Time: 01/17/21 11:20 AM  Specimen: Nasopharyngeal Swab; Nasopharyngeal(NP) swabs in vial transport medium  Result Value Ref Range Status   SARS Coronavirus 2 by RT PCR NEGATIVE NEGATIVE Final    Comment: (NOTE) SARS-CoV-2 target nucleic acids are NOT DETECTED.  The SARS-CoV-2 RNA is generally detectable in upper respiratory specimens during the acute phase of infection. The lowest concentration of SARS-CoV-2 viral copies this assay can detect is 138 copies/mL. A negative result does not preclude SARS-Cov-2 infection and should not be used as the sole basis for treatment or other patient management decisions. A negative result may occur with  improper specimen collection/handling, submission of specimen other than nasopharyngeal swab, presence of viral mutation(s) within the areas targeted by this assay, and inadequate number of viral copies(<138 copies/mL). A negative result must be combined with clinical observations, patient history, and epidemiological information. The expected result is Negative.  Fact Sheet  for Patients:  EntrepreneurPulse.com.au  Fact Sheet for Healthcare Providers:  IncredibleEmployment.be  This test is no t yet approved or cleared by the [place] FDA and  has been authorized for detection and/or diagnosis of SARS-CoV-2 by FDA under an Emergency Use Authorization (EUA). This EUA will remain  in effect (meaning this test can be used) for the duration of the COVID-19 declaration under Section 564(b)(1) of the Act, 21 U.S.C.section 360bbb-3(b)(1), unless the authorization is terminated  or revoked sooner.       Influenza A by PCR NEGATIVE NEGATIVE Final   Influenza B by PCR NEGATIVE NEGATIVE Final    Comment: (NOTE) The Xpert Xpress SARS-CoV-2/FLU/RSV plus assay is intended as an aid in the diagnosis of influenza from Nasopharyngeal swab specimens and should not be used as a sole basis for treatment. Nasal washings and aspirates are unacceptable for Xpert Xpress SARS-CoV-2/FLU/RSV testing.  Fact Sheet for Patients: EntrepreneurPulse.com.au  Fact Sheet for Healthcare Providers: IncredibleEmployment.be  This test is not yet approved or cleared by the [place] FDA and has been authorized for detection and/or diagnosis of SARS-CoV-2 by FDA under an Emergency Use Authorization (EUA). This EUA will remain in effect (meaning this test can be used) for the duration of the COVID-19 declaration under Section 564(b)(1) of the Act, 21 U.S.C. section 360bbb-3(b)(1), unless the authorization is terminated or revoked.  Performed at [facility], [address] [place], [place] [address]      Studies: CT Abdomen Pelvis W Contrast  Result Date: 01/17/2021 CLINICAL DATA:  Upper abdominal pain since 4 a.m., nausea/vomiting, elevated lipase, history of pancreatitis EXAM: CT ABDOMEN AND PELVIS WITH CONTRAST TECHNIQUE: Multidetector CT imaging of the abdomen and pelvis was performed using  the standard protocol following bolus administration of intravenous contrast. CONTRAST:  162mL OMNIPAQUE IOHEXOL 300 MG/ML  SOLN COMPARISON:  None. FINDINGS: Lower chest: Mild subpleural scarring. Hepatobiliary: Two subcentimeter cyst in the left hepatic lobe. Gallbladder is unremarkable. No intrahepatic or extrahepatic duct dilatation Pancreas: Peripancreatic fluid/inflammatory changes, with subtle hypoenhancement along the pancreatic body (series 2/image 32), but no frank necrosis or hemorrhage. No drainable fluid collection/walled-off necrosis. Parenchymal calcification in the pancreatic head/uncinate process (series 2/image 35) likely reflects sequela of prior/chronic pancreatitis. No pancreatic ductal dilatation. Spleen: Within normal limits. Adrenals/Urinary Tract: Adrenal glands are within normal limits. Bilateral renal cysts, including in 18 mm hemorrhagic cyst along the left upper kidney (series 2/image 23) and a 4.1 cm left upper pole renal sinus cyst (series 2/image 37). No hydronephrosis. Bladder is within normal limits. Stomach/Bowel: Stomach is notable for a moderate hiatal hernia with trace fluid. No  evidence of bowel obstruction. Appendix is not discretely visualized. Left colon is decompressed. Vascular/Lymphatic: No evidence of abdominal aortic aneurysm. Atherosclerotic calcifications of the abdominal aorta and branch vessels. No suspicious abdominopelvic lymphadenopathy. Reproductive: Prostate is grossly unremarkable. Other: No abdominopelvic ascites. Musculoskeletal: Mild degenerative changes of the visualized thoracolumbar spine. IMPRESSION: Acute pancreatitis with mild peripancreatic fluid. No convincing pancreatic necrosis. No hemorrhage or drainable fluid collection/walled-off necrosis. Electronically Signed   By: Julian Hy M.D.   On: 01/17/2021 10:57    Scheduled Meds:  enoxaparin (LOVENOX) injection  40 mg Subcutaneous W97L   folic acid  1 mg Oral Daily   multivitamin with  minerals  1 tablet Oral Daily   thiamine  100 mg Oral Daily   Continuous Infusions:  sodium chloride 100 mL/hr at 01/18/21 0845   Brief history.  Patient admitted 01/17/2021 with abdominal pain and found to have acute pancreatitis.  The patient does drink alcohol.  Also had elevated liver function test and thrombocytopenia.  Past medical history of alcohol abuse and pancreatitis. Assessment/Plan:  Acute pancreatitis secondary to alcohol.  Advised cessation of alcohol.  Continue IV fluids.  Start clear liquid diet.  Order liver function test and lipase again for tomorrow.  As needed pain medications.  Add oral pain medications. Alcohol abuse.  Thiamine, folic acid and multivitamin.  Alcohol withdrawal protocol Elevated liver enzymes and thrombocytopenia secondary to alcohol.  Recheck labs tomorrow. Tobacco abuse Hepatitis C antibody positive in 2019 but HCV DNA quantitative not detected at that time.    Code Status:     Code Status Orders  (From admission, onward)           Start     Ordered   01/17/21 1127  Full code  Continuous        01/17/21 1128           Code Status History     Date Active Date Inactive Code Status Order ID Comments User Context   01/10/2018 1107 01/12/2018 1501 Full Code [id number]  Dustin Flock, MD Inpatient      Family Communication: Spoke with Allegra Grana on the phone Disposition Plan: Status is: Inpatient  Dispo: The patient is from: Home              Anticipated d/c is to: Home              Patient currently being treated for acute pancreatitis requiring IV fluids and IV and p.o. pain medication.   Difficult to place patient.  No.  Time spent: 28 minutes  [place]

## 2021-01-19 ENCOUNTER — Inpatient Hospital Stay: Payer: Self-pay

## 2021-01-19 LAB — CBC
HCT: 35.8 % — ABNORMAL LOW (ref 39.0–52.0)
Hemoglobin: 12.9 g/dL — ABNORMAL LOW (ref 13.0–17.0)
MCH: 35.5 pg — ABNORMAL HIGH (ref 26.0–34.0)
MCHC: 36 g/dL (ref 30.0–36.0)
MCV: 98.6 fL (ref 80.0–100.0)
Platelets: 123 10*3/uL — ABNORMAL LOW (ref 150–400)
RBC: 3.63 MIL/uL — ABNORMAL LOW (ref 4.22–5.81)
RDW: 12.1 % (ref 11.5–15.5)
WBC: 9.5 10*3/uL (ref 4.0–10.5)
nRBC: 0 % (ref 0.0–0.2)

## 2021-01-19 LAB — COMPREHENSIVE METABOLIC PANEL
ALT: 60 U/L — ABNORMAL HIGH (ref 0–44)
AST: 123 U/L — ABNORMAL HIGH (ref 15–41)
Albumin: 3.1 g/dL — ABNORMAL LOW (ref 3.5–5.0)
Alkaline Phosphatase: 56 U/L (ref 38–126)
Anion gap: 8 (ref 5–15)
BUN: 7 mg/dL (ref 6–20)
CO2: 26 mmol/L (ref 22–32)
Calcium: 7.7 mg/dL — ABNORMAL LOW (ref 8.9–10.3)
Chloride: 102 mmol/L (ref 98–111)
Creatinine, Ser: 0.61 mg/dL (ref 0.61–1.24)
GFR, Estimated: >60 mL/min (ref >60)
Glucose, Bld: 85 mg/dL (ref 70–99)
Potassium: 3.3 mmol/L — ABNORMAL LOW (ref 3.5–5.1)
Sodium: 136 mmol/L (ref 135–145)
Total Bilirubin: 2.2 mg/dL — ABNORMAL HIGH (ref 0.3–1.2)
Total Protein: 6.6 g/dL (ref 6.5–8.1)

## 2021-01-19 LAB — MAGNESIUM: Magnesium: 1.7 mg/dL (ref 1.7–2.4)

## 2021-01-19 LAB — LIPASE, BLOOD: Lipase: 100 U/L — ABNORMAL HIGH (ref 11–51)

## 2021-01-19 MED ORDER — MAGNESIUM SULFATE 2 GM/50ML IV SOLN
2.0000 g | Freq: Once | INTRAVENOUS | Status: AC
  Administered 2021-01-19: 2 g via INTRAVENOUS
  Filled 2021-01-19: qty 50

## 2021-01-19 MED ORDER — POTASSIUM CHLORIDE CRYS ER 20 MEQ PO TBCR
40.0000 meq | EXTENDED_RELEASE_TABLET | Freq: Once | ORAL | Status: AC
  Administered 2021-01-19: 40 meq via ORAL
  Filled 2021-01-19: qty 2

## 2021-01-19 NOTE — Progress Notes (Signed)
PROGRESS NOTE    Luke Rogers.  BDZ:[id number] DOB: 11/07/1960 DOA: 01/17/2021 PCP: Patient, No Pcp Per (Inactive)    Chief Complaint  Patient presents with   Abdominal Pain    Brief Narrative:    Patient admitted 01/17/2021 with abdominal pain and found to have acute pancreatitis.  The patient does drink alcohol.  Also had elevated liver function test and thrombocytopenia.  Past medical history of alcohol abuse and pancreatitis.   Assessment & Plan:   Principal Problem:   Acute alcoholic pancreatitis Active Problems:   Alcohol dependence with uncomplicated withdrawal (HCC)   Nicotine dependence   Transaminitis   Alcohol abuse   Elevated LFTs   Thrombocytopenia (HCC)   Tobacco abuse  Acute pancreatitis secondary to alcohol.   RUQ [place] with steatosis Advised cessation of alcohol - he's agreeable - discussed meds, he's not interested at this time Full liquids Pain improving, still with notable distension, follow  Alcohol abuse.  Thiamine, folic acid and multivitamin.  Alcohol withdrawal protocol  Elevated liver enzymes and thrombocytopenia secondary to alcohol.  Recheck labs tomorrow.  Tobacco abuse  Hepatitis C antibody positive in 2019 but HCV DNA quantitative not detected at that time.   DVT prophylaxis: lovenox Code Status: full Family Communication: none at bedside Disposition:   Status is: Inpatient  Remains inpatient appropriate because:Inpatient level of care appropriate due to severity of illness  Dispo: The patient is from: Home              Anticipated d/c is to: Home              Patient currently is not medically stable to d/c.   Difficult to place patient No       Consultants:  none  Procedures:  non  Antimicrobials: Anti-infectives (From admission, onward)    None          Subjective: Feels better overall, though still c/o 7/10 pain (though he says it was over Alaina Donati 20 when he got here)  Objective: Vitals:   01/18/21  2321 01/19/21 0359 01/19/21 0813 01/19/21 1610  BP: 117/68 116/81 103/65 105/71  Pulse: (!) 57 65 (!) 59 (!) 53  Resp: 16 20 20 20   Temp: 98.1 F (36.7 C) 98.5 F (36.9 C) 98.4 F (36.9 C)   TempSrc:  Oral Oral   SpO2: 96% 97% 94% 94%  Weight:      Height:        Intake/Output Summary (Last 24 hours) at 01/19/2021 1911 Last data filed at 01/19/2021 1844 Gross per 24 hour  Intake 4850.7 ml  Output --  Net 4850.7 ml   Filed Weights   01/17/21 0838 01/17/21 2133  Weight: 81.6 kg 77.5 kg    Examination:  General exam: Appears calm and comfortable  Respiratory system: Clear to auscultation. Respiratory effort normal. Cardiovascular system: S1 & S2 heard, RRR. Gastrointestinal system: Abdomen is distended, mildly diffusely tender Central nervous system: Alert and oriented. No focal neurological deficits. Extremities: no LEE Skin: No rashes, lesions or ulcers Psychiatry: Judgement and insight appear normal. Mood & affect appropriate.     Data Reviewed: I have personally reviewed following labs and imaging studies  CBC: Recent Labs  Lab 01/17/21 0820 01/18/21 0420 01/19/21 0422  WBC 9.6 9.3 9.5  HGB 16.2 14.4 12.9*  HCT 45.1 39.8 35.8*  MCV 95.8 97.1 98.6  PLT 171 138* 123*    Basic Metabolic Panel: Recent Labs  Lab 01/17/21 0820 01/17/21 1120 01/18/21 0420  01/19/21 0422  NA 137  --  138 136  K 4.1  --  3.6 3.3*  CL 100  --  102 102  CO2 29  --  29 26  GLUCOSE 144*  --  92 85  BUN 7  --  10 7  CREATININE 0.70  --  0.64 0.61  CALCIUM 9.4  --  8.1* 7.7*  MG  --  1.4*  --  1.7  PHOS  --  3.0  --   --     GFR: Estimated Creatinine Clearance: 111.7 mL/min (by C-G formula based on SCr of 0.61 mg/dL).  Liver Function Tests: Recent Labs  Lab 01/17/21 0820 01/19/21 0422  AST 236* 123*  ALT 114* 60*  ALKPHOS 83 56  BILITOT 1.6* 2.2*  PROT 8.8* 6.6  ALBUMIN 4.0 3.1*    CBG: No results for input(s): GLUCAP in the last 168 hours.   Recent Results  (from the past 240 hour(s))  Resp Panel by RT-PCR (Flu Meris Reede&B, Covid) Nasopharyngeal Swab     Status: None   Collection Time: 01/17/21 11:20 AM   Specimen: Nasopharyngeal Swab; Nasopharyngeal(NP) swabs in vial transport medium  Result Value Ref Range Status   SARS Coronavirus 2 by RT PCR NEGATIVE NEGATIVE Final    Comment: (NOTE) SARS-CoV-2 target nucleic acids are NOT DETECTED.  The SARS-CoV-2 RNA is generally detectable in upper respiratory specimens during the acute phase of infection. The lowest concentration of SARS-CoV-2 viral copies this assay can detect is 138 copies/mL. Aylene Acoff negative result does not preclude SARS-Cov-2 infection and should not be used as the sole basis for treatment or other patient management decisions. Kirklin Mcduffee negative result may occur with  improper specimen collection/handling, submission of specimen other than nasopharyngeal swab, presence of viral mutation(s) within the areas targeted by this assay, and inadequate number of viral copies(<138 copies/mL). Ollis Daudelin negative result must be combined with clinical observations, patient history, and epidemiological information. The expected result is Negative.  Fact Sheet for Patients:  EntrepreneurPulse.com.au  Fact Sheet for Healthcare Providers:  IncredibleEmployment.be  This test is no t yet approved or cleared by the [place] FDA and  has been authorized for detection and/or diagnosis of SARS-CoV-2 by FDA under an Emergency Use Authorization (EUA). This EUA will remain  in effect (meaning this test can be used) for the duration of the COVID-19 declaration under Section 564(b)(1) of the Act, 21 U.S.C.section 360bbb-3(b)(1), unless the authorization is terminated  or revoked sooner.       Influenza [place] Test by PCR NEGATIVE NEGATIVE Final   Influenza B by PCR NEGATIVE NEGATIVE Final    Comment: (NOTE) The Xpert Xpress SARS-CoV-2/FLU/RSV plus assay is intended as an aid in the  diagnosis of influenza from Nasopharyngeal swab specimens and should not be used as Magaby Rumberger sole basis for treatment. Nasal washings and aspirates are unacceptable for Xpert Xpress SARS-CoV-2/FLU/RSV testing.  Fact Sheet for Patients: EntrepreneurPulse.com.au  Fact Sheet for Healthcare Providers: IncredibleEmployment.be  This test is not yet approved or cleared by the [place] FDA and has been authorized for detection and/or diagnosis of SARS-CoV-2 by FDA under an Emergency Use Authorization (EUA). This EUA will remain in effect (meaning this test can be used) for the duration of the COVID-19 declaration under Section 564(b)(1) of the Act, 21 U.S.C. section 360bbb-3(b)(1), unless the authorization is terminated or revoked.  Performed at [facility], [address] [place], [place] [address]          Radiology Studies:  US Abdomen Limited RUQ (LIVER/GB)  Result Date: 01/19/2021 CLINICAL DATA:  Acute pancreatitis, nausea and vomiting, elevated lipase EXAM: ULTRASOUND ABDOMEN LIMITED RIGHT UPPER QUADRANT COMPARISON:  01/17/2021 FINDINGS: Gallbladder: No gallstones or wall thickening visualized. No sonographic Murphy sign noted by sonographer. Common bile duct: Diameter: 6.3 mm Liver: Increased echogenicity compatible with hepatic steatosis. Fatty sparing along the gallbladder fossa noted. No other focal abnormality or intrahepatic biliary dilatation. Portal vein is patent on color Doppler imaging with normal direction of blood flow towards the liver. Other: Trace perihepatic ascites IMPRESSION: Hepatic steatosis and trace upper abdominal ascites. Negative for gallstones. Electronically Signed   By: Jerilynn Mages.  Shick M.D.   On: 01/19/2021 10:47        Scheduled Meds:  enoxaparin (LOVENOX) injection  40 mg Subcutaneous X21J   folic acid  1 mg Oral Daily   multivitamin with minerals  1 tablet Oral Daily   thiamine  100 mg Oral Daily    Continuous Infusions:  sodium chloride Stopped (01/19/21 1625)     LOS: 2 days    Time spent: over 30 min    Fayrene Helper, MD [facility]   To contact the attending provider between 7A-7P or the covering provider during after hours 7P-7A, please log into the web site [email or web address] and access using universal [place] password for that web site. If you do not have the password, please call the hospital operator.  01/19/2021, 7:11 PM

## 2021-01-20 LAB — CBC WITH DIFFERENTIAL/PLATELET
Abs Immature Granulocytes: 0.02 10*3/uL (ref 0.00–0.07)
Basophils Absolute: 0.1 10*3/uL (ref 0.0–0.1)
Basophils Relative: 1 %
Eosinophils Absolute: 0.5 10*3/uL (ref 0.0–0.5)
Eosinophils Relative: 7 %
HCT: 35.5 % — ABNORMAL LOW (ref 39.0–52.0)
Hemoglobin: 12.7 g/dL — ABNORMAL LOW (ref 13.0–17.0)
Immature Granulocytes: 0 %
Lymphocytes Relative: 33 %
Lymphs Abs: 2.5 10*3/uL (ref 0.7–4.0)
MCH: 35.2 pg — ABNORMAL HIGH (ref 26.0–34.0)
MCHC: 35.8 g/dL (ref 30.0–36.0)
MCV: 98.3 fL (ref 80.0–100.0)
Monocytes Absolute: 0.8 10*3/uL (ref 0.1–1.0)
Monocytes Relative: 10 %
Neutro Abs: 3.9 10*3/uL (ref 1.7–7.7)
Neutrophils Relative %: 49 %
Platelets: 138 10*3/uL — ABNORMAL LOW (ref 150–400)
RBC: 3.61 MIL/uL — ABNORMAL LOW (ref 4.22–5.81)
RDW: 11.9 % (ref 11.5–15.5)
WBC: 7.8 10*3/uL (ref 4.0–10.5)
nRBC: 0 % (ref 0.0–0.2)

## 2021-01-20 LAB — COMPREHENSIVE METABOLIC PANEL
ALT: 70 U/L — ABNORMAL HIGH (ref 0–44)
AST: 144 U/L — ABNORMAL HIGH (ref 15–41)
Albumin: 3 g/dL — ABNORMAL LOW (ref 3.5–5.0)
Alkaline Phosphatase: 61 U/L (ref 38–126)
Anion gap: 3 — ABNORMAL LOW (ref 5–15)
BUN: 6 mg/dL (ref 6–20)
CO2: 27 mmol/L (ref 22–32)
Calcium: 7.8 mg/dL — ABNORMAL LOW (ref 8.9–10.3)
Chloride: 107 mmol/L (ref 98–111)
Creatinine, Ser: 0.54 mg/dL — ABNORMAL LOW (ref 0.61–1.24)
GFR, Estimated: >60 mL/min (ref >60)
Glucose, Bld: 94 mg/dL (ref 70–99)
Potassium: 4.1 mmol/L (ref 3.5–5.1)
Sodium: 137 mmol/L (ref 135–145)
Total Bilirubin: 1.3 mg/dL — ABNORMAL HIGH (ref 0.3–1.2)
Total Protein: 6.5 g/dL (ref 6.5–8.1)

## 2021-01-20 LAB — LIPASE, BLOOD: Lipase: 84 U/L — ABNORMAL HIGH (ref 11–51)

## 2021-01-20 LAB — PHOSPHORUS: Phosphorus: 2.4 mg/dL — ABNORMAL LOW (ref 2.5–4.6)

## 2021-01-20 LAB — MAGNESIUM: Magnesium: 1.9 mg/dL (ref 1.7–2.4)

## 2021-01-20 MED ORDER — OXYCODONE HCL 5 MG PO TABS: 5.0000 mg | ORAL_TABLET | Freq: Four times a day (QID) | ORAL | 0 refills | Status: AC | PRN

## 2021-01-20 NOTE — Discharge Summary (Signed)
Physician Discharge Summary  [name]. YBO:[id number] DOB: Nov 02, 1960 DOA: 01/17/2021  PCP: Patient, No Pcp Per (Inactive)  Admit date: 01/17/2021 Discharge date: 01/20/2021  Time spent: 40 minutes  Recommendations for Outpatient Follow-up:  Follow outpatient CBC/CMP Follow alcohol cessation outpatient Follow elevated LFT's, thrombocytopenia outpatient Follow hepatic steatosis outpatient   Discharge Diagnoses:  Principal Problem:   Acute alcoholic pancreatitis Active Problems:   Alcohol dependence with uncomplicated withdrawal ([place])   Nicotine dependence   Transaminitis   Alcohol abuse   Elevated LFTs   Thrombocytopenia ([place])   Tobacco abuse   Discharge Condition: stable  Diet recommendation: heart healthy  Filed Weights   01/17/21 0838 01/17/21 2133  Weight: 81.6 kg 77.5 kg    History of present illness:   Patient admitted 01/17/2021 with abdominal pain and found to have acute pancreatitis.  The patient does drink alcohol.  Also had elevated liver function test and thrombocytopenia.  Past medical history of alcohol abuse and pancreatitis.  He was admitted for alcoholic pancreatitis and improved with IVF, pain management, bowel rest.  Encouraged cessation which he seems agreeable to.  Discharged with plan for outpatient follow up.   See below for additional details  Hospital Course:  Acute pancreatitis secondary to alcohol.   RUQ [place] with steatosis Advised cessation of alcohol - he's agreeable - discussed meds, he's not interested at this time Tolerating PO  Pain improving, still with some mild distension - ok to follow outpatient given his subjective improvement, tolerating PO, minimal pain   Alcohol abuse.  Thiamine, folic acid and multivitamin.  Alcohol withdrawal protocol Cessation encouraged, he notes he's going to stop He declined any medication   Hepatic Steatosis  Follow outpatient Elevated liver enzymes and thrombocytopenia secondary to alcohol.   Recheck labs tomorrow.   Elevated LFT's  Thrombocytopenia Mild, 2/2 etoh, follow outpatient   Hepatitis C antibody positive in 2019 but HCV DNA quantitative not detected at that time.  Procedures: none  Consultations: none  Discharge Exam: Vitals:   01/20/21 0527 01/20/21 0814  BP: 106/63 137/81  Pulse: (!) 55 (!) 51  Resp: 18 16  Temp: 98 F (36.7 C) 98.4 F (36.9 C)  SpO2: 97% 97%   Eager to go home Says he's going to stop drinking Discussed d/c plan and recs  General: No acute distress. Cardiovascular: Heart sounds show Mayli Covington regular rate, and rhythm. No gallops or rubs. No murmurs. No JVD. Lungs: Clear to auscultation bilaterally w Abdomen: Soft, nontender, mild distension  Neurological: Alert and oriented 3. Moves all extremities 4 . Cranial nerves II through XII grossly intact. Skin: Warm and dry. No rashes or lesions. Extremities: No clubbing or cyanosis. No edema.  Discharge Instructions   Discharge Instructions     Call MD for:  difficulty breathing, headache or visual disturbances   Complete by: As directed    Call MD for:  extreme fatigue   Complete by: As directed    Call MD for:  hives   Complete by: As directed    Call MD for:  persistant dizziness or light-headedness   Complete by: As directed    Call MD for:  persistant nausea and vomiting   Complete by: As directed    Call MD for:  redness, tenderness, or signs of infection (pain, swelling, redness, odor or green/yellow discharge around incision site)   Complete by: As directed    Call MD for:  severe uncontrolled pain   Complete by: As directed  Call MD for:  temperature >100.4   Complete by: As directed    Diet - low sodium heart healthy   Complete by: As directed    Discharge instructions   Complete by: As directed    You were seen for pancreatitis related to alcohol use.  Alcohol cessation is extremely important.  Please consider following up with your PCP or other resources like  [facility] to help with continued cessation.  There are also medications that can help with alcohol cessation.    Your liver enzymes are mildly elevated.  I would recommend avoiding tylenol or over the counter medicines without talking to your doctor.  You have evidence of fatty liver which is likely related to your alcohol use.  Please establish with Analiah Drum PCP.  Return for new, recurrent, or worsening symptoms.  Please ask your PCP to request records from this hospitalization so they know what was done and what the next steps will be.   Increase activity slowly   Complete by: As directed       Allergies as of 01/20/2021   No Known Allergies      Medication List     TAKE these medications    ibuprofen 200 MG tablet Commonly known as: ADVIL Take 400-600 mg by mouth every 6 (six) hours as needed for mild pain or moderate pain.   oxyCODONE 5 MG immediate release tablet Commonly known as: Oxy IR/ROXICODONE Take 1 tablet (5 mg total) by mouth every 6 (six) hours as needed for up to 3 days for moderate pain.       No Known Allergies    The results of significant diagnostics from this hospitalization (including imaging, microbiology, ancillary and laboratory) are listed below for reference.    Significant Diagnostic Studies: CT Abdomen Pelvis W Contrast  Result Date: 01/17/2021 CLINICAL DATA:  Upper abdominal pain since 4 Rand Boller.m., nausea/vomiting, elevated lipase, history of pancreatitis EXAM: CT ABDOMEN AND PELVIS WITH CONTRAST TECHNIQUE: Multidetector CT imaging of the abdomen and pelvis was performed using the standard protocol following bolus administration of intravenous contrast. CONTRAST:  166mL OMNIPAQUE IOHEXOL 300 MG/ML  SOLN COMPARISON:  None. FINDINGS: Lower chest: Mild subpleural scarring. Hepatobiliary: Two subcentimeter cyst in the left hepatic lobe. Gallbladder is unremarkable. No intrahepatic or extrahepatic duct dilatation Pancreas: Peripancreatic fluid/inflammatory changes,  with subtle hypoenhancement along the pancreatic body (series 2/image 32), but no frank necrosis or hemorrhage. No drainable fluid collection/walled-off necrosis. Parenchymal calcification in the pancreatic head/uncinate process (series 2/image 35) likely reflects sequela of prior/chronic pancreatitis. No pancreatic ductal dilatation. Spleen: Within normal limits. Adrenals/Urinary Tract: Adrenal glands are within normal limits. Bilateral renal cysts, including in 18 mm hemorrhagic cyst along the left upper kidney (series 2/image 23) and Cataleia Gade 4.1 cm left upper pole renal sinus cyst (series 2/image 37). No hydronephrosis. Bladder is within normal limits. Stomach/Bowel: Stomach is notable for Tagen Milby moderate hiatal hernia with trace fluid. No evidence of bowel obstruction. Appendix is not discretely visualized. Left colon is decompressed. Vascular/Lymphatic: No evidence of abdominal aortic aneurysm. Atherosclerotic calcifications of the abdominal aorta and branch vessels. No suspicious abdominopelvic lymphadenopathy. Reproductive: Prostate is grossly unremarkable. Other: No abdominopelvic ascites. Musculoskeletal: Mild degenerative changes of the visualized thoracolumbar spine. IMPRESSION: Acute pancreatitis with mild peripancreatic fluid. No convincing pancreatic necrosis. No hemorrhage or drainable fluid collection/walled-off necrosis. Electronically Signed   By: Julian Hy M.D.   On: 01/17/2021 10:57   US Abdomen Limited RUQ (LIVER/GB)  Result Date: 01/19/2021 CLINICAL DATA:  Acute pancreatitis, nausea  and vomiting, elevated lipase EXAM: ULTRASOUND ABDOMEN LIMITED RIGHT UPPER QUADRANT COMPARISON:  01/17/2021 FINDINGS: Gallbladder: No gallstones or wall thickening visualized. No sonographic Murphy sign noted by sonographer. Common bile duct: Diameter: 6.3 mm Liver: Increased echogenicity compatible with hepatic steatosis. Fatty sparing along the gallbladder fossa noted. No other focal abnormality or intrahepatic  biliary dilatation. Portal vein is patent on color Doppler imaging with normal direction of blood flow towards the liver. Other: Trace perihepatic ascites IMPRESSION: Hepatic steatosis and trace upper abdominal ascites. Negative for gallstones. Electronically Signed   By: Jerilynn Mages.  Shick M.D.   On: 01/19/2021 10:47    Microbiology: Recent Results (from the past 240 hour(s))  Resp Panel by RT-PCR (Flu Nakayla Rorabaugh&B, Covid) Nasopharyngeal Swab     Status: None   Collection Time: 01/17/21 11:20 AM   Specimen: Nasopharyngeal Swab; Nasopharyngeal(NP) swabs in vial transport medium  Result Value Ref Range Status   SARS Coronavirus 2 by RT PCR NEGATIVE NEGATIVE Final    Comment: (NOTE) SARS-CoV-2 target nucleic acids are NOT DETECTED.  The SARS-CoV-2 RNA is generally detectable in upper respiratory specimens during the acute phase of infection. The lowest concentration of SARS-CoV-2 viral copies this assay can detect is 138 copies/mL. Dannika Hilgeman negative result does not preclude SARS-Cov-2 infection and should not be used as the sole basis for treatment or other patient management decisions. Darrly Loberg negative result may occur with  improper specimen collection/handling, submission of specimen other than nasopharyngeal swab, presence of viral mutation(s) within the areas targeted by this assay, and inadequate number of viral copies(<138 copies/mL). Suhani Stillion negative result must be combined with clinical observations, patient history, and epidemiological information. The expected result is Negative.  Fact Sheet for Patients:  EntrepreneurPulse.com.au  Fact Sheet for Healthcare Providers:  IncredibleEmployment.be  This test is no t yet approved or cleared by the [place] FDA and  has been authorized for detection and/or diagnosis of SARS-CoV-2 by FDA under an Emergency Use Authorization (EUA). This EUA will remain  in effect (meaning this test can be used) for the duration of the COVID-19  declaration under Section 564(b)(1) of the Act, 21 U.S.C.section 360bbb-3(b)(1), unless the authorization is terminated  or revoked sooner.       Influenza Adali Pennings by PCR NEGATIVE NEGATIVE Final   Influenza B by PCR NEGATIVE NEGATIVE Final    Comment: (NOTE) The Xpert Xpress SARS-CoV-2/FLU/RSV plus assay is intended as an aid in the diagnosis of influenza from Nasopharyngeal swab specimens and should not be used as Ollie Delano sole basis for treatment. Nasal washings and aspirates are unacceptable for Xpert Xpress SARS-CoV-2/FLU/RSV testing.  Fact Sheet for Patients: EntrepreneurPulse.com.au  Fact Sheet for Healthcare Providers: IncredibleEmployment.be  This test is not yet approved or cleared by the [place] FDA and has been authorized for detection and/or diagnosis of SARS-CoV-2 by FDA under an Emergency Use Authorization (EUA). This EUA will remain in effect (meaning this test can be used) for the duration of the COVID-19 declaration under Section 564(b)(1) of the Act, 21 U.S.C. section 360bbb-3(b)(1), unless the authorization is terminated or revoked.  Performed at [facility], [address] [place], [place] [address]      Labs: Basic Metabolic Panel: Recent Labs  Lab 01/17/21 0820 01/17/21 1120 01/18/21 0420 01/19/21 0422 01/20/21 0538  NA 137  --  138 136 137  K 4.1  --  3.6 3.3* 4.1  CL 100  --  102 102 107  CO2 29  --  29 26 27   GLUCOSE 144*  --  92 85 94  BUN 7  --  10 7 6   CREATININE 0.70  --  0.64 0.61 0.54*  CALCIUM 9.4  --  8.1* 7.7* 7.8*  MG  --  1.4*  --  1.7 1.9  PHOS  --  3.0  --   --  2.4*   Liver Function Tests: Recent Labs  Lab 01/17/21 0820 01/19/21 0422 01/20/21 0538  AST 236* 123* 144*  ALT 114* 60* 70*  ALKPHOS 83 56 61  BILITOT 1.6* 2.2* 1.3*  PROT 8.8* 6.6 6.5  ALBUMIN 4.0 3.1* 3.0*   Recent Labs  Lab 01/17/21 0820 01/19/21 0422 01/20/21 0538  LIPASE 321* 100* 84*   No results for  input(s): AMMONIA in the last 168 hours. CBC: Recent Labs  Lab 01/17/21 0820 01/18/21 0420 01/19/21 0422 01/20/21 0538  WBC 9.6 9.3 9.5 7.8  NEUTROABS  --   --   --  3.9  HGB 16.2 14.4 12.9* 12.7*  HCT 45.1 39.8 35.8* 35.5*  MCV 95.8 97.1 98.6 98.3  PLT 171 138* 123* 138*   Cardiac Enzymes: No results for input(s): CKTOTAL, CKMB, CKMBINDEX, TROPONINI in the last 168 hours. BNP: BNP (last 3 results) No results for input(s): BNP in the last 8760 hours.  ProBNP (last 3 results) No results for input(s): PROBNP in the last 8760 hours.  CBG: No results for input(s): GLUCAP in the last 168 hours.     Signed:  Fayrene Helper MD.  [facility] 01/20/2021, 11:23 AM

## 2021-01-20 NOTE — TOC Initial Note (Signed)
Transition of Care (TOC) - Initial/Assessment Note    Patient Details  Name: Luke Rogers. MRN: [id number] Date of Birth: Apr 04, 1961  [facility] CM/SW Contact:    Beverly Sessions, RN Phone Number: 01/20/2021, 2:52 PM  Clinical Narrative:                  Patient was discharged home today Met with patient and fiance at discharge  Patient uninsured and does not have pcp Provided information on [facility]  and Medication Management .  Patient Declines medication assistance  Discussed outpatient substance abuse resources.  Patient declines.  States "I've done that before, im not doing that again"       Patient Goals and CMS Choice        Expected Discharge Plan and Services           Expected Discharge Date: 01/20/21                                    Prior Living Arrangements/Services                       Activities of Daily Living Home Assistive Devices/Equipment: None ADL Screening (condition at time of admission) Patient's cognitive ability adequate to safely complete daily activities?: Yes Is the patient deaf or have difficulty hearing?: No Does the patient have difficulty seeing, even when wearing glasses/contacts?: No Does the patient have difficulty concentrating, remembering, or making decisions?: No Patient able to express need for assistance with ADLs?: Yes Does the patient have difficulty dressing or bathing?: No Independently performs ADLs?: Yes (appropriate for developmental age) Does the patient have difficulty walking or climbing stairs?: No Weakness of Legs: None Weakness of Arms/Hands: None  Permission Sought/Granted                  Emotional Assessment              Admission diagnosis:  Acute alcoholic pancreatitis [O19.15] Patient Active Problem List   Diagnosis Date Noted   Alcohol abuse    Elevated LFTs    Thrombocytopenia ([facility])    Tobacco abuse    Acute alcoholic  pancreatitis 50/27/1423   Nicotine dependence 01/17/2021   Transaminitis 01/17/2021   Malnutrition of moderate degree 01/09/2018   Acute purulent meningitis 01/08/2018   Alcohol dependence with uncomplicated withdrawal ([place]) 02/16/2015   PCP:  Patient, No Pcp Per (Inactive) Pharmacy:   [id number] [address], [address] [place] [address] Phone: [phone] Fax: [phone]     Social Determinants of Health (SDOH) Interventions    Readmission Risk Interventions No flowsheet data found.

## 2021-01-20 NOTE — Progress Notes (Signed)
[name]. to be D/C'd Home per MD order.  Discussed prescriptions and follow up appointments with the patient. Prescriptions given to patient, medication list explained in detail. Pt verbalized understanding.  Allergies as of 01/20/2021   No Known Allergies      Medication List     TAKE these medications    ibuprofen 200 MG tablet Commonly known as: ADVIL Take 400-600 mg by mouth every 6 (six) hours as needed for mild pain or moderate pain.   oxyCODONE 5 MG immediate release tablet Commonly known as: Oxy IR/ROXICODONE Take 1 tablet (5 mg total) by mouth every 6 (six) hours as needed for up to 3 days for moderate pain.        Vitals:   01/20/21 0527 01/20/21 0814  BP: 106/63 137/81  Pulse: (!) 55 (!) 51  Resp: 18 16  Temp: 98 F (36.7 C) 98.4 F (36.9 C)  SpO2: 97% 97%    Skin clean, dry and intact without evidence of skin break down, no evidence of skin tears noted. IV catheter discontinued intact. Site without signs and symptoms of complications. Dressing and pressure applied. Pt denies pain at this time. No complaints noted.  An After Visit Summary was printed and given to the patient. Patient escorted via [place], and D/C home via private auto.  Rolley Sims

## 2021-01-20 NOTE — Progress Notes (Signed)
Patient stated he ate food that his wife brought in last night. Patient was re-educated on full liquid diet.

## 2021-01-20 NOTE — Progress Notes (Signed)
Patient denies pain and nausea. Requesting to be discharged. MD paged.

## 2021-09-26 ENCOUNTER — Ambulatory Visit: Payer: Self-pay | Admitting: *Deleted

## 2021-09-26 NOTE — Telephone Encounter (Signed)
I returned pt's call.    C/o right ear pain and unsteady gait making him feel dizzy.  Green and yellow discharge from ear for 3 wks.   Has New pt. Appt with Rocco Serene for 02/01/2022. ? ? ?Reason for Disposition ? Patient sounds very sick or weak to the triager ?   Referred to [facility] in Clinic.   Does not have a PCP ? ?Answer Assessment - Initial Assessment Questions ?1. LOCATION: "Which ear is involved?"  ?    Right ear.   My balance is off.   I can't hardly walk.   When I walk my balance is off.  One side of my head is shut down.  Can't hear nothing on right side.    ?Yellow and green drainage from ear. ?2. COLOR: "What is the color of the discharge?"  ?    Green and yellow discharge.   ?I clean my ear out hydrogen peroxide.    ?No fever.   I can't hear and can't work because I work on a [employer].   ?3. CONSISTENCY: "How runny is the discharge? Could it be water?"  ?    *No Answer* ?4. ONSET: "When did you first notice the discharge?" ?    3 wks ago ?5. PAIN: "Is there any earache?" "How bad is it?"  (Scale 1-10; or mild, moderate, severe) ?    I can't hear on the right side ?6. OBJECTS: "Have you put anything in your ear?" (e.g., Q-tip, other object)  ?    *No Answer* ?7. OTHER SYMPTOMS: "Do you have any other symptoms?" (e.g., headache, fever, dizziness, vomiting, runny nose) ?    *No Answer* ?8. PREGNANCY: "Is there any chance you are pregnant?" "When was your last menstrual period?" ?    *No Answer* ? ?Protocols used: Ear - Discharge-A-AH ? ?

## 2021-09-26 NOTE — Telephone Encounter (Signed)
?  Chief Complaint: Off balance and dizzy from right ear pain and drainage.  ?Symptoms: off balance, I can't hardly walk, drainage yellow green from right ear, can't hear. ?Frequency: For 3 wks ?Pertinent Negatives: Patient denies Fever ?Disposition: '[]'$ ED /'[x]'$ Urgent Care (no appt availability in office) / '[]'$ Appointment(In office/virtual)/ '[]'$  [facility]/ '[]'$ Home Care/ '[]'$ Refused Recommended Disposition /'[]'$ [facility]/ '[]'$  Follow-up with PCP ?Additional Notes: I have referred him to the [facility] in Clinic because he does not have insurance.   He has a new pt appt set up with [facility] since he does not have a PCP.  ?

## 2021-12-17 ENCOUNTER — Inpatient Hospital Stay
Admission: EM | Admit: 2021-12-17 | Discharge: 2021-12-23 | DRG: 897 | Disposition: A | Discharge status: Home / Self Care | Payer: Self-pay | Attending: Student | Admitting: Student

## 2021-12-17 ENCOUNTER — Encounter: Payer: Self-pay | Admitting: Emergency Medicine

## 2021-12-17 ENCOUNTER — Other Ambulatory Visit: Payer: Self-pay

## 2021-12-17 DIAGNOSIS — Z720 Tobacco use: Secondary | ICD-10-CM | POA: Diagnosis present

## 2021-12-17 DIAGNOSIS — B1920 Unspecified viral hepatitis C without hepatic coma: Secondary | ICD-10-CM | POA: Diagnosis present

## 2021-12-17 DIAGNOSIS — K219 Gastro-esophageal reflux disease without esophagitis: Secondary | ICD-10-CM | POA: Diagnosis present

## 2021-12-17 DIAGNOSIS — T50901A Poisoning by unspecified drugs, medicaments and biological substances, accidental (unintentional), initial encounter: Secondary | ICD-10-CM

## 2021-12-17 DIAGNOSIS — F17210 Nicotine dependence, cigarettes, uncomplicated: Secondary | ICD-10-CM | POA: Diagnosis present

## 2021-12-17 DIAGNOSIS — F10932 Alcohol use, unspecified with withdrawal with perceptual disturbance: Secondary | ICD-10-CM | POA: Insufficient documentation

## 2021-12-17 DIAGNOSIS — E512 Wernicke's encephalopathy: Secondary | ICD-10-CM | POA: Diagnosis present

## 2021-12-17 DIAGNOSIS — T43625A Adverse effect of amphetamines, initial encounter: Secondary | ICD-10-CM | POA: Diagnosis present

## 2021-12-17 DIAGNOSIS — Y900 Blood alcohol level of less than 20 mg/100 ml: Secondary | ICD-10-CM | POA: Diagnosis present

## 2021-12-17 DIAGNOSIS — F1090 Alcohol use, unspecified, uncomplicated: Secondary | ICD-10-CM

## 2021-12-17 DIAGNOSIS — E871 Hypo-osmolality and hyponatremia: Secondary | ICD-10-CM | POA: Diagnosis not present

## 2021-12-17 DIAGNOSIS — K760 Fatty (change of) liver, not elsewhere classified: Secondary | ICD-10-CM | POA: Diagnosis present

## 2021-12-17 DIAGNOSIS — D696 Thrombocytopenia, unspecified: Secondary | ICD-10-CM | POA: Diagnosis present

## 2021-12-17 DIAGNOSIS — R441 Visual hallucinations: Secondary | ICD-10-CM | POA: Diagnosis present

## 2021-12-17 DIAGNOSIS — Z8249 Family history of ischemic heart disease and other diseases of the circulatory system: Secondary | ICD-10-CM

## 2021-12-17 DIAGNOSIS — F1510 Other stimulant abuse, uncomplicated: Secondary | ICD-10-CM

## 2021-12-17 DIAGNOSIS — E872 Acidosis, unspecified: Secondary | ICD-10-CM | POA: Diagnosis present

## 2021-12-17 DIAGNOSIS — F10232 Alcohol dependence with withdrawal with perceptual disturbance: Principal | ICD-10-CM | POA: Diagnosis present

## 2021-12-17 DIAGNOSIS — F15129 Other stimulant abuse with intoxication, unspecified: Secondary | ICD-10-CM | POA: Diagnosis present

## 2021-12-17 DIAGNOSIS — F15929 Other stimulant use, unspecified with intoxication, unspecified: Secondary | ICD-10-CM | POA: Insufficient documentation

## 2021-12-17 DIAGNOSIS — Y929 Unspecified place or not applicable: Secondary | ICD-10-CM

## 2021-12-17 LAB — CBC
HCT: 46.4 % (ref 39.0–52.0)
Hemoglobin: 16.3 g/dL (ref 13.0–17.0)
MCH: 33.1 pg (ref 26.0–34.0)
MCHC: 35.1 g/dL (ref 30.0–36.0)
MCV: 94.1 fL (ref 80.0–100.0)
Platelets: 102 10*3/uL — ABNORMAL LOW (ref 150–400)
RBC: 4.93 MIL/uL (ref 4.22–5.81)
RDW: 12.7 % (ref 11.5–15.5)
WBC: 8.6 10*3/uL (ref 4.0–10.5)
nRBC: 0 % (ref 0.0–0.2)

## 2021-12-17 MED ORDER — LACTATED RINGERS IV BOLUS
1000.0000 mL | Freq: Once | INTRAVENOUS | Status: AC
  Administered 2021-12-18: 1000 mL via INTRAVENOUS

## 2021-12-17 MED ORDER — LORAZEPAM 2 MG/ML IJ SOLN
1.0000 mg | Freq: Once | INTRAMUSCULAR | Status: AC
  Administered 2021-12-18: 1 mg via INTRAVENOUS
  Filled 2021-12-17: qty 1

## 2021-12-17 NOTE — ED Provider Notes (Signed)
[facility] Provider Note    Event Date/Time   First MD Initiated Contact with Patient 12/17/21 2344     (approximate)   History   Medication Reaction   HPI  Luke Montanari. is a 61 y.o. male who presents to the ED for evaluation of Medication Reaction   I reviewed medical DC summary from last year where patient was admitted for acute pancreatitis.  He has a history of longstanding alcohol abuse.  He presents to the ED for evaluation of visual hallucinations, feeling jittery and unwell after he took 3 unknown recreational drugs he bought from a friend.  He reports buying 3 capsules with a white powder that he thought were a "downer" such as Xanax.  After taking these, he reports developing visual hallucinations and anxiety.  He presents due to this.  He reports drinking his typical amount of ethanol, 7 beers today.  Denies any other recreational drugs.  Denies IVDU.  Denies recent illnesses and reports feeling fine prior to taking the pills.   Physical Exam   Triage Vital Signs: ED Triage Vitals  Enc Vitals Group     BP 12/17/21 2325 (!) 181/96     Pulse Rate 12/17/21 2325 (!) 130     Resp 12/17/21 2325 (!) 24     Temp 12/17/21 2325 97.9 F (36.6 C)     Temp Source 12/17/21 2325 Oral     SpO2 12/17/21 2325 96 %     Weight 12/17/21 2335 170 lb (77.1 kg)     Height 12/17/21 2335 '6\' 3"'$  (1.905 m)     Head Circumference --      Peak Flow --      Pain Score 12/17/21 2335 0     Pain Loc --      Pain Edu? --      Excl. in [place]? --     Most recent vital signs: Vitals:   12/18/21 0030 12/18/21 0100  BP: 110/73 124/87  Pulse: 93 93  Resp: (!) 23 16  Temp:    SpO2: 93% 94%    General: Awake,.  Seems anxious and tremulous.  He is oriented and not particularly altered, but does report visual hallucinations with the robot in the doorway and a child sitting on top of the television when the TV is off. CV:  Good peripheral perfusion.  Tachycardic  and regular Resp:  Normal effort.  Abd:  No distention.  Soft and benign MSK:  No deformity noted.  No signs of trauma Neuro:  No focal deficits appreciated. Cranial nerves II through XII intact 5/5 strength and sensation in all 4 extremities Other:  Pupils are fairly large, equal, round and reactive to light bilaterally.   ED Results / Procedures / Treatments   Labs (all labs ordered are listed, but only abnormal results are displayed) Labs Reviewed  COMPREHENSIVE METABOLIC PANEL - Abnormal; Notable for the following components:      Result Value   Sodium 125 (*)    Chloride 92 (*)    CO2 19 (*)    Glucose, Bld 140 (*)    Calcium 8.8 (*)    Total Protein 9.5 (*)    AST 104 (*)    Total Bilirubin 2.7 (*)    All other components within normal limits  ETHANOL - Abnormal; Notable for the following components:   Alcohol, Ethyl (B) 15 (*)    All other components within normal limits  SALICYLATE LEVEL - Abnormal;  Notable for the following components:   Salicylate Lvl <8.9 (*)    All other components within normal limits  ACETAMINOPHEN LEVEL - Abnormal; Notable for the following components:   Acetaminophen (Tylenol), Serum <10 (*)    All other components within normal limits  CBC - Abnormal; Notable for the following components:   Platelets 102 (*)    All other components within normal limits  URINE DRUG SCREEN, QUALITATIVE ([facility] ONLY)    EKG Sinus tachycardia with rate of 126 bpm.  Normal axis and intervals.  No clear evidence of acute ischemia.  RADIOLOGY   Official radiology report(s): No results found.  PROCEDURES and INTERVENTIONS:  .1-3 Lead EKG Interpretation Performed by: Vladimir Crofts, MD Authorized by: Vladimir Crofts, MD     Interpretation: abnormal     ECG rate:  104   ECG rate assessment: tachycardic     Rhythm: sinus tachycardia     Ectopy: none     Conduction: normal   .Critical Care Performed by: Vladimir Crofts, MD Authorized by: Vladimir Crofts, MD    Critical care provider statement:    Critical care time (minutes):  30   Critical care time was exclusive of:  Separately billable procedures and treating other patients   Critical care was necessary to treat or prevent imminent or life-threatening deterioration of the following conditions:  Toxidrome   Critical care was time spent personally by me on the following activities:  Development of treatment plan with patient or surrogate, discussions with consultants, evaluation of patient's response to treatment, examination of patient, ordering and review of laboratory studies, ordering and review of radiographic studies, ordering and performing treatments and interventions, pulse oximetry, re-evaluation of patient's condition and review of old charts  Medications  lactated ringers bolus 1,000 mL (1,000 mLs Intravenous New Bag/Given 12/18/21 0016)  LORazepam (ATIVAN) injection 1 mg (1 mg Intravenous Given 12/18/21 0014)  LORazepam (ATIVAN) injection 1 mg (1 mg Intravenous Given 12/18/21 0109)  chlordiazePOXIDE (LIBRIUM) capsule 25 mg (25 mg Oral Given 12/18/21 0109)     IMPRESSION / MDM / [place] / ED COURSE  I reviewed the triage vital signs and the nursing notes.  Differential diagnosis includes, but is not limited to, DTs, alcohol withdrawals, sympathomimetic toxidrome.  Trauma, acute psychiatric emergency.  Psychotic break.  {Patient presents with symptoms of an acute illness or injury that is potentially life-threatening.  Chronic alcoholic presents to the ED with acute visual hallucinations and anxiety, likely due to whatever recreational pills he took at a party with friends.  His symptoms are most consistent with a sympathomimetic toxidrome, possibly methamphetamines.  DTs is less likely as he is not altered, and he has not had any seizures.  Alcohol withdrawals is a possibility though, but he is adamant that he has had his typical 7 beers today.  See no evidence of trauma,  neurologic deficits.  Blood work does have acute hyponatremia, which may be related and contributing.  Otherwise normal CBC.  Ethanol is slightly elevated on serum test.  Provided IV fluids and IV Ativan, but his symptoms seem to worsen with developing new visual hallucinations and since being here.  Due to this and his acute hyponatremia, we will consult with medicine for admission.  Clinical Course as of 12/18/21 0138  Sun Dec 18, 2021  0100 Reassessed.  He reports he is now visually hallucinating.  He did not have this before.  Heart rate remains in the mid 90s.  I reassessed him.  We  will provide more benzodiazepines. [DS]  0126 I consult with Dr. Damita Dunnings, she requests that we wait for the UDS results and I placed another consult order for admission at that point. [DS]    Clinical Course User Index [DS] Vladimir Crofts, MD     FINAL CLINICAL IMPRESSION(S) / ED DIAGNOSES   Final diagnoses:  Hyponatremia  Visual hallucination  Accidental drug overdose, initial encounter     Rx / DC Orders   ED Discharge Orders     None        Note:  This document was prepared using Dragon voice recognition software and may include unintentional dictation errors.   Vladimir Crofts, MD 12/18/21 [phone]

## 2021-12-17 NOTE — ED Triage Notes (Signed)
  Patient BIB [facility] for medication reaction.  Patient states he has been drinking michelob light beer all day and took 3 pills he got from a friend.  Patient states he does not know what the pills are for but started having hallucinations and became diaphoretic at home.  Patient called 911 asking for help and [facility] brought him in.  Patient states they were 3 white capsules and took them around 2100.  No pain at this time.  Denies any SI/HI.  States he doesn't take any other drugs.

## 2021-12-18 DIAGNOSIS — E871 Hypo-osmolality and hyponatremia: Secondary | ICD-10-CM

## 2021-12-18 DIAGNOSIS — T43625A Adverse effect of amphetamines, initial encounter: Secondary | ICD-10-CM

## 2021-12-18 DIAGNOSIS — E872 Acidosis, unspecified: Secondary | ICD-10-CM

## 2021-12-18 DIAGNOSIS — R441 Visual hallucinations: Secondary | ICD-10-CM

## 2021-12-18 DIAGNOSIS — F1510 Other stimulant abuse, uncomplicated: Secondary | ICD-10-CM | POA: Diagnosis present

## 2021-12-18 DIAGNOSIS — F15929 Other stimulant use, unspecified with intoxication, unspecified: Secondary | ICD-10-CM | POA: Insufficient documentation

## 2021-12-18 HISTORY — DX: Visual hallucinations: R44.1

## 2021-12-18 HISTORY — DX: Acidosis, unspecified: E87.20

## 2021-12-18 HISTORY — DX: Adverse effect of amphetamines, initial encounter: T43.625A

## 2021-12-18 HISTORY — DX: Other disorders of bilirubin metabolism: E80.6

## 2021-12-18 HISTORY — DX: Hypo-osmolality and hyponatremia: E87.1

## 2021-12-18 LAB — URINE DRUG SCREEN, QUALITATIVE (ARMC ONLY)
Amphetamines, Ur Screen: POSITIVE — AB
Barbiturates, Ur Screen: NOT DETECTED
Benzodiazepine, Ur Scrn: NOT DETECTED
Cannabinoid 50 Ng, Ur ~~LOC~~: NOT DETECTED
Cocaine Metabolite,Ur ~~LOC~~: NOT DETECTED
MDMA (Ecstasy)Ur Screen: NOT DETECTED
Methadone Scn, Ur: NOT DETECTED
Opiate, Ur Screen: NOT DETECTED
Phencyclidine (PCP) Ur S: NOT DETECTED
Tricyclic, Ur Screen: NOT DETECTED

## 2021-12-18 LAB — COMPREHENSIVE METABOLIC PANEL
ALT: 34 U/L (ref 0–44)
AST: 104 U/L — ABNORMAL HIGH (ref 15–41)
Albumin: 3.8 g/dL (ref 3.5–5.0)
Alkaline Phosphatase: 91 U/L (ref 38–126)
Anion gap: 14 (ref 5–15)
BUN: 11 mg/dL (ref 6–20)
CO2: 19 mmol/L — ABNORMAL LOW (ref 22–32)
Calcium: 8.8 mg/dL — ABNORMAL LOW (ref 8.9–10.3)
Chloride: 92 mmol/L — ABNORMAL LOW (ref 98–111)
Creatinine, Ser: 0.84 mg/dL (ref 0.61–1.24)
GFR, Estimated: >60 mL/min (ref >60)
Glucose, Bld: 140 mg/dL — ABNORMAL HIGH (ref 70–99)
Potassium: 3.8 mmol/L (ref 3.5–5.1)
Sodium: 125 mmol/L — ABNORMAL LOW (ref 135–145)
Total Bilirubin: 2.7 mg/dL — ABNORMAL HIGH (ref 0.3–1.2)
Total Protein: 9.5 g/dL — ABNORMAL HIGH (ref 6.5–8.1)

## 2021-12-18 LAB — BASIC METABOLIC PANEL
Anion gap: 5 (ref 5–15)
Anion gap: 7 (ref 5–15)
BUN: 11 mg/dL (ref 6–20)
BUN: 11 mg/dL (ref 6–20)
CO2: 24 mmol/L (ref 22–32)
CO2: 25 mmol/L (ref 22–32)
Calcium: 8.1 mg/dL — ABNORMAL LOW (ref 8.9–10.3)
Calcium: 8.4 mg/dL — ABNORMAL LOW (ref 8.9–10.3)
Chloride: 100 mmol/L (ref 98–111)
Chloride: 99 mmol/L (ref 98–111)
Creatinine, Ser: 0.61 mg/dL (ref 0.61–1.24)
Creatinine, Ser: 0.65 mg/dL (ref 0.61–1.24)
GFR, Estimated: >60 mL/min (ref >60)
GFR, Estimated: >60 mL/min (ref >60)
Glucose, Bld: 91 mg/dL (ref 70–99)
Glucose, Bld: 114 mg/dL — ABNORMAL HIGH (ref 70–99)
Potassium: 3.8 mmol/L (ref 3.5–5.1)
Potassium: 3.8 mmol/L (ref 3.5–5.1)
Sodium: 129 mmol/L — ABNORMAL LOW (ref 135–145)
Sodium: 131 mmol/L — ABNORMAL LOW (ref 135–145)

## 2021-12-18 LAB — ACETAMINOPHEN LEVEL: Acetaminophen (Tylenol), Serum: <10 ug/mL — ABNORMAL LOW (ref 10–30)

## 2021-12-18 LAB — ETHANOL: Alcohol, Ethyl (B): 15 mg/dL — ABNORMAL HIGH (ref <10)

## 2021-12-18 LAB — SALICYLATE LEVEL: Salicylate Lvl: <7 mg/dL — ABNORMAL LOW (ref 7.0–30.0)

## 2021-12-18 MED ORDER — ACETAMINOPHEN 325 MG PO TABS
650.0000 mg | ORAL_TABLET | Freq: Four times a day (QID) | ORAL | Status: DC | PRN
Start: 2021-12-18 — End: 2021-12-23
  Administered 2021-12-20 – 2021-12-22 (×3): 650 mg via ORAL
  Filled 2021-12-18 (×3): qty 2

## 2021-12-18 MED ORDER — LORAZEPAM 2 MG/ML IJ SOLN
0.0000 mg | Freq: Four times a day (QID) | INTRAMUSCULAR | Status: DC
  Administered 2021-12-18: 2 mg via INTRAVENOUS
  Administered 2021-12-18: 1 mg via INTRAVENOUS
  Administered 2021-12-18: 2 mg via INTRAVENOUS
  Filled 2021-12-18 (×3): qty 1

## 2021-12-18 MED ORDER — ADULT MULTIVITAMIN W/MINERALS CH
1.0000 | ORAL_TABLET | Freq: Every day | ORAL | Status: DC
  Administered 2021-12-19 – 2021-12-23 (×4): 1 via ORAL
  Filled 2021-12-18 (×4): qty 1

## 2021-12-18 MED ORDER — ACETAMINOPHEN 650 MG RE SUPP
650.0000 mg | Freq: Four times a day (QID) | RECTAL | Status: DC | PRN
Start: 2021-12-18 — End: 2021-12-23

## 2021-12-18 MED ORDER — LORAZEPAM 2 MG/ML IJ SOLN
0.0000 mg | Freq: Two times a day (BID) | INTRAMUSCULAR | Status: DC
  Administered 2021-12-20: 1 mg via INTRAVENOUS
  Filled 2021-12-18: qty 1

## 2021-12-18 MED ORDER — CHLORDIAZEPOXIDE HCL 25 MG PO CAPS
25.0000 mg | ORAL_CAPSULE | Freq: Once | ORAL | Status: AC
  Administered 2021-12-18: 25 mg via ORAL
  Filled 2021-12-18: qty 1

## 2021-12-18 MED ORDER — SODIUM CHLORIDE 0.9 % IV SOLN: INTRAVENOUS | Status: AC

## 2021-12-18 MED ORDER — LORAZEPAM 1 MG PO TABS
1.0000 mg | ORAL_TABLET | ORAL | Status: DC | PRN
  Administered 2021-12-18: 1 mg via ORAL
  Filled 2021-12-18: qty 1

## 2021-12-18 MED ORDER — ENOXAPARIN SODIUM 40 MG/0.4ML IJ SOSY
40.0000 mg | PREFILLED_SYRINGE | INTRAMUSCULAR | Status: DC
  Administered 2021-12-19 – 2021-12-23 (×5): 40 mg via SUBCUTANEOUS
  Filled 2021-12-18 (×5): qty 0.4

## 2021-12-18 MED ORDER — THIAMINE HCL 100 MG PO TABS
100.0000 mg | ORAL_TABLET | Freq: Every day | ORAL | Status: DC
  Administered 2021-12-19 – 2021-12-20 (×2): 100 mg via ORAL
  Filled 2021-12-18 (×2): qty 1

## 2021-12-18 MED ORDER — THIAMINE HCL 100 MG/ML IJ SOLN
100.0000 mg | Freq: Every day | INTRAMUSCULAR | Status: DC
  Administered 2021-12-18: 100 mg via INTRAVENOUS
  Filled 2021-12-18: qty 2

## 2021-12-18 MED ORDER — LORAZEPAM 2 MG/ML IJ SOLN: 1.0000 mg | INTRAMUSCULAR | Status: DC | PRN

## 2021-12-18 MED ORDER — HALOPERIDOL LACTATE 5 MG/ML IJ SOLN
1.0000 mg | Freq: Once | INTRAMUSCULAR | Status: AC
  Administered 2021-12-18: 1 mg via INTRAVENOUS
  Filled 2021-12-18: qty 1

## 2021-12-18 MED ORDER — LORAZEPAM 2 MG/ML IJ SOLN
1.0000 mg | Freq: Once | INTRAMUSCULAR | Status: AC
  Administered 2021-12-18: 1 mg via INTRAVENOUS
  Filled 2021-12-18: qty 1

## 2021-12-18 MED ORDER — FOLIC ACID 1 MG PO TABS
1.0000 mg | ORAL_TABLET | Freq: Every day | ORAL | Status: DC
  Administered 2021-12-19 – 2021-12-23 (×4): 1 mg via ORAL
  Filled 2021-12-18 (×4): qty 1

## 2021-12-18 MED ORDER — ONDANSETRON HCL 4 MG PO TABS: 4.0000 mg | ORAL_TABLET | Freq: Four times a day (QID) | ORAL | Status: DC | PRN

## 2021-12-18 MED ORDER — ONDANSETRON HCL 4 MG/2ML IJ SOLN: 4.0000 mg | Freq: Four times a day (QID) | INTRAMUSCULAR | Status: DC | PRN

## 2021-12-18 NOTE — Progress Notes (Signed)
PROGRESS NOTE  Luke Rogers.    DOB: 27-Dec-1960, 61 y.o.  [id number]    Code Status: Full Code   DOA: 12/17/2021   LOS: 0   Brief hospital course  Luke Kingbird. is a 61 y.o. male with a PMH significant for Alcohol use disorder, alcoholic pancreatitis, alcoholic steatosis and history of hepatitis C antibody positive in 2019.  They presented from home to the ED on 12/17/2021 with hallucinations and acute intoxication of amphetamines. He states that he drinks 12 beers per day.   In the ED, it was found that they were tachycardic to 130, tachypneic to 24 with BP 181/96 and otherwise normal vitals.  Significant findings included Sodium 125, bicarb 19, AST 104 and total bili 2.7.  EtOH and salicylate levels undetectable and EtOH level 15.  CBC WNL  EKG- Sinus tachycardia at 126 with no acute ST-T wave changes.   UDS positive for amphetamines..  They were initially treated with librium and IV fluids.   Patient was admitted to medicine service for further workup and management of acute amphetamine intoxication as outlined in detail below.  12/18/21 -stable, improved  Assessment & Plan  Principal Problem:   Hyponatremia Active Problems:   Amphetamine adverse reaction, initial encounter   Metabolic acidosis   Visual hallucinations   Alcohol use disorder   Hyperbilirubinemia   Amphetamine adverse reaction  Amphetamine intoxication- patient states it was unintentional. Vitals have stabilized. Denies hallucinations today.   Alcohol use disorder- no h/o seizures. Endorses 12 beers per day. EtOH level 15 on admission. - CIWA monitoring with PRN ativan - counseling for detox provided  Hyponatremia- likely related to potomania. Na+ 125>>>131 and stable.  - Na+ q6hr - IVF - frequent neuro checks  Thrombocytopenia- likely related to liver disease. No active bleeding. Platelets 102  Body mass index is 21.25 kg/m.  VTE ppx: enoxaparin (LOVENOX) injection 40 mg Start:  12/18/21 0800   Diet:     Diet   Diet Heart Room service appropriate? Yes; Fluid consistency: Thin   Consultants: None  Subjective 12/18/21    Pt reports feeling improved. He would like to go home. Endorses feeling tremulous. Wife states that he has been having urinary frequency and urgency leading to some incontinence.    Objective   Vitals:   12/18/21 0600 12/18/21 0700 12/18/21 0715 12/18/21 0800  BP: 118/72 95/72  108/69  Pulse: 61  97 66  Resp: 20  (!) 21 20  Temp:      TempSrc:      SpO2: 95%  94% 96%  Weight:      Height:       No intake or output data in the 24 hours ending 12/18/21 0816 Filed Weights   12/17/21 2335  Weight: 77.1 kg     Physical Exam:  General: awake, alert, NAD Respiratory: normal respiratory effort. Cardiovascular: quick capillary refill Nervous: A&O x3. no gross focal neurologic deficits, normal speech Extremities: moves all equally, no edema, normal tone Skin: dry, intact, normal temperature, normal color. No rashes, lesions or ulcers on exposed skin Psychiatry: normal mood, congruent affect. Denies hallucinations.  Labs   I have personally reviewed the following labs and imaging studies CBC    Component Value Date/Time   WBC 8.6 12/17/2021 2338   RBC 4.93 12/17/2021 2338   HGB 16.3 12/17/2021 2338   HCT 46.4 12/17/2021 2338   PLT 102 (L) 12/17/2021 2338   MCV 94.1 12/17/2021 2338   MCH 33.1  12/17/2021 2338   MCHC 35.1 12/17/2021 2338   RDW 12.7 12/17/2021 2338   LYMPHSABS 2.5 01/20/2021 0538   MONOABS 0.8 01/20/2021 0538   EOSABS 0.5 01/20/2021 0538   BASOSABS 0.1 01/20/2021 0538      Latest Ref Rng & Units 12/17/2021   11:38 PM 01/20/2021    5:38 AM 01/19/2021    4:22 AM  BMP  Glucose 70 - 99 mg/dL 140   94   85    BUN 6 - 20 mg/dL '11   6   7    '$ Creatinine 0.61 - 1.24 mg/dL 0.84   0.54   0.61    Sodium 135 - 145 mmol/L 125   137   136    Potassium 3.5 - 5.1 mmol/L 3.8   4.1   3.3    Chloride 98 - 111 mmol/L 92   107    102    CO2 22 - 32 mmol/L '19   27   26    '$ Calcium 8.9 - 10.3 mg/dL 8.8   7.8   7.7      No results found.  Disposition Plan & Communication  Patient status: Observation  Admitted From: Home Planned disposition location: Home Anticipated discharge date: 5/30 pending Na stabilization  Family Communication: wife at bedside    Author: Richarda Osmond, DO [facility] 12/18/2021, 8:16 AM   Available by Epic secure chat 7AM-7PM. If 7PM-7AM, please contact night-coverage.  [facility] contact information found on [email or web address].

## 2021-12-18 NOTE — Assessment & Plan Note (Signed)
>>  ASSESSMENT AND PLAN FOR AMPHETAMINE INTOXICATION (HCC) WRITTEN ON 12/23/2021  6:27 PM BY GONFA, Boyce Medici, MD  UDS positive for amphetamine. Counseled on the importance of cessation and provided with resources.

## 2021-12-18 NOTE — H&P (Signed)
History and Physical    Patient: Luke Rogers. [id number]:[id number] DOB: 1961/03/07 DOA: 12/17/2021 DOS: the patient was seen and examined on 12/18/2021 PCP: Patient, No Pcp Per (Inactive)  Patient coming from: Home  Chief Complaint:  Chief Complaint  Patient presents with   Medication Reaction    HPI: Chrisangel Eskenazi. is a 61 y.o. male with medical history significant for Alcohol use disorder, alcoholic pancreatitis, alcoholic steatosis and history of hepatitis C antibody positive in 2019 but with negative HCV DNA who presents to the ED with hallucinations.  Patient had been drinking beer all day and then took 3 unknown pills from a friend at around 2100 then started having hallucinations and became very diaphoretic. ED course and data review: On arrival tachycardic to 130, tachypneic to 24 with BP 181/96 and otherwise normal vitals.  Sodium 125, bicarb 19, AST 104 and total bili 2.7.  EtOH and salicylate levels undetectable and EtOH level 15.  CBC WNL EKG, personally reviewed and interpreted: Sinus tachycardia at 126 with no acute ST-T wave changes.  UDS positive for amphetamines. Patient was treated with Librium and IV fluid boluses.  Hospitalist consulted for admission.   Review of Systems: As mentioned in the history of present illness. All other systems reviewed and are negative. Past Medical History:  Diagnosis Date   Alcohol abuse    Hepatitis C    Pancreatitis    Past Surgical History:  Procedure Laterality Date   FOOT SURGERY Bilateral    As infant   WRIST SURGERY Right    Social History:  reports that he has been smoking cigarettes. He has been smoking an average of 1 pack per day. He has never used smokeless tobacco. He reports current alcohol use. He reports that he does not use drugs.  No Known Allergies  Family History  Problem Relation Age of Onset   Hypertension Mother     Prior to Admission medications   Medication Sig Start Date End Date Taking?  Authorizing Provider  ibuprofen (ADVIL) 200 MG tablet Take 400-600 mg by mouth every 6 (six) hours as needed for mild pain or moderate pain.    [provider]    Physical Exam: Vitals:   12/17/21 2335 12/18/21 0009 12/18/21 0030 12/18/21 0100  BP:  117/81 110/73 124/87  Pulse:  100 93 93  Resp:  (!) 30 (!) 23 16  Temp:      TempSrc:      SpO2:  93% 93% 94%  Weight: 77.1 kg     Height: '6\' 3"'$  (1.905 m)      Physical Exam Vitals and nursing note reviewed.  Constitutional:      General: He is not in acute distress. HENT:     Head: Normocephalic and atraumatic.  Cardiovascular:     Rate and Rhythm: Normal rate and regular rhythm.     Heart sounds: Normal heart sounds.  Pulmonary:     Effort: Pulmonary effort is normal.     Breath sounds: Normal breath sounds.  Abdominal:     Palpations: Abdomen is soft.     Tenderness: There is no abdominal tenderness.  Neurological:     Motor: Tremor present.  Psychiatric:        Attention and Perception: He perceives visual hallucinations.        Mood and Affect: Mood is anxious.     Data Reviewed: Relevant notes from primary care and specialist visits, past discharge summaries as available in EHR, including  Care Everywhere. Prior diagnostic testing as pertinent to current admission diagnoses Updated medications and problem lists for reconciliation ED course, including vitals, labs, imaging, treatment and response to treatment Triage notes, nursing and pharmacy notes and ED provider's notes Notable results as noted in HPI   Assessment and Plan: * Hyponatremia Suspect low solute related to beer intake  IV Saline and monitor serum  sodium Follow urine and sodium osmolality and urine sodium  Amphetamine adverse reaction, initial encounter Patient with visual hallucinations and diaphoresis. UDS positive for amphetamines.  Ativan as needed and hydration  Visual hallucinations Most likely related to amphetamines.  Lower  suspicion for alcohol withdrawal at this time  Metabolic acidosis Salicylic acid undetectable Hydrate and monitor  Hyperbilirubinemia Elevated AST and bilirubin Hepatitis panel and right upper quadrant ultrasound  Alcohol use disorder Alcohol withdrawal prevention protocol       Advance Care Planning:   Code Status: Prior   Consults: none  Family Communication: wife at bedside  Severity of Illness: The appropriate patient status for this patient is OBSERVATION. Observation status is judged to be reasonable and necessary in order to provide the required intensity of service to ensure the patient's safety. The patient's presenting symptoms, physical exam findings, and initial radiographic and laboratory data in the context of their medical condition is felt to place them at decreased risk for further clinical deterioration. Furthermore, it is anticipated that the patient will be medically stable for discharge from the hospital within 2 midnights of admission.   Author: Athena Masse, MD 12/18/2021 1:55 AM  For on call review [email or web address].

## 2021-12-18 NOTE — Assessment & Plan Note (Signed)
UDS positive for amphetamine. Counseled on the importance of cessation and provided with resources.

## 2021-12-18 NOTE — TOC Initial Note (Signed)
Transition of Care (TOC) - Initial/Assessment Note    Patient Details  Name: Luke Rogers. MRN: [id number] Date of Birth: [birth year]  Transition of Care [facility]) CM/SW Contact:    Janyth Contes, National Phone Number: 12/18/2021, 8:58 AM  Clinical Narrative:                   [facility] consult for substance use concerns. CSW attached outpatient and residential substance use resources to AVS for patient to review when stable.   No further TOC needs at this time.      Patient Goals and CMS Choice        Expected Discharge Plan and Services                                                Prior Living Arrangements/Services                       Activities of Daily Living      Permission Sought/Granted                  Emotional Assessment              Admission diagnosis:  Amphetamine adverse reaction [T43.625A] Patient Active Problem List   Diagnosis Date Noted   Hyponatremia 05/39/7673   Metabolic acidosis 41/93/7902   Hyperbilirubinemia 12/18/2021   Visual hallucinations 12/18/2021   Amphetamine adverse reaction, initial encounter 12/18/2021   Amphetamine adverse reaction 12/18/2021   Alcohol abuse    Elevated LFTs    Thrombocytopenia ([place])    Tobacco abuse    Acute alcoholic pancreatitis 40/97/3532   Nicotine dependence 01/17/2021   Transaminitis 01/17/2021   Malnutrition of moderate degree 01/09/2018   Acute purulent meningitis 01/08/2018   Alcohol use disorder 02/16/2015   PCP:  Patient, No Pcp Per (Inactive) Pharmacy:   [id number] [address], [address] AT [address] [place] [address] Phone: [phone] Fax: [phone]     Social Determinants of Health (SDOH) Interventions    Readmission Risk Interventions     View : No data to display.

## 2021-12-18 NOTE — Progress Notes (Signed)
Vitals stable.  Trending sodium Followed up on labs that were ordered.  Supportive care PRN

## 2021-12-18 NOTE — ED Notes (Signed)
RN to bedside to introduce self to pt. Pt HR was 150 and no pulse ox. RN placed pulse ox on pt toe. Pt is very restless and cant lay still. Pt is saturated in cold wet urine. This RN changed linens, removed wet clothing and placed on chux.

## 2021-12-18 NOTE — Plan of Care (Signed)

## 2021-12-18 NOTE — Assessment & Plan Note (Addendum)
Likely due to amphetamine and alcohol withdrawal.  Resolved.

## 2021-12-18 NOTE — ED Notes (Signed)
  Patient pulling at cardiac monitoring cords, trying to climb out of bed, and attempting to pull out PIV.  Notified attending and obtaining order for medication to help keep patient calm.

## 2021-12-18 NOTE — Assessment & Plan Note (Addendum)
Suspect low solute related to beer intake  IV Saline and monitor serum  sodium Follow urine and sodium osmolality and urine sodium

## 2021-12-18 NOTE — Assessment & Plan Note (Signed)
Salicylic acid undetectable Hydrate and monitor

## 2021-12-18 NOTE — ED Notes (Signed)
Pt states he was out partying with friends tonight and took 3 pills. Pt doesn't know what kind of pills they were. Pt also states he has been drinking today as well.   Pt appears anxious at this time. Pt is A&Ox4.

## 2021-12-18 NOTE — Assessment & Plan Note (Signed)
Likely due to alcohol. -Continue monitoring

## 2021-12-18 NOTE — Assessment & Plan Note (Signed)
Resolved.  Received high-dose thiamine for 3 days. -Discharged on short course of Librium taper for 3 days -Counseled on the importance of alcohol and amphetamine cessation -Provided with resources. -Continue vitamins and folic acid

## 2021-12-18 NOTE — ED Notes (Signed)
Pt will not keep cardiac leads on . Replaced again. Pt restless in bed and pulling them off.

## 2021-12-19 DIAGNOSIS — T43625A Adverse effect of amphetamines, initial encounter: Secondary | ICD-10-CM

## 2021-12-19 DIAGNOSIS — F1090 Alcohol use, unspecified, uncomplicated: Secondary | ICD-10-CM

## 2021-12-19 DIAGNOSIS — E872 Acidosis, unspecified: Secondary | ICD-10-CM

## 2021-12-19 DIAGNOSIS — T50901A Poisoning by unspecified drugs, medicaments and biological substances, accidental (unintentional), initial encounter: Secondary | ICD-10-CM

## 2021-12-19 DIAGNOSIS — R441 Visual hallucinations: Secondary | ICD-10-CM

## 2021-12-19 LAB — BASIC METABOLIC PANEL
Anion gap: 7 (ref 5–15)
Anion gap: 6 (ref 5–15)
BUN: 10 mg/dL (ref 6–20)
BUN: 9 mg/dL (ref 6–20)
CO2: 23 mmol/L (ref 22–32)
CO2: 25 mmol/L (ref 22–32)
Calcium: 8.2 mg/dL — ABNORMAL LOW (ref 8.9–10.3)
Calcium: 8.3 mg/dL — ABNORMAL LOW (ref 8.9–10.3)
Chloride: 99 mmol/L (ref 98–111)
Chloride: 100 mmol/L (ref 98–111)
Creatinine, Ser: 0.63 mg/dL (ref 0.61–1.24)
Creatinine, Ser: 0.69 mg/dL (ref 0.61–1.24)
GFR, Estimated: >60 mL/min (ref >60)
GFR, Estimated: >60 mL/min (ref >60)
Glucose, Bld: 97 mg/dL (ref 70–99)
Glucose, Bld: 99 mg/dL (ref 70–99)
Potassium: 3.6 mmol/L (ref 3.5–5.1)
Potassium: 3.7 mmol/L (ref 3.5–5.1)
Sodium: 129 mmol/L — ABNORMAL LOW (ref 135–145)
Sodium: 131 mmol/L — ABNORMAL LOW (ref 135–145)

## 2021-12-19 LAB — SODIUM
Sodium: 131 mmol/L — ABNORMAL LOW (ref 135–145)
Sodium: 132 mmol/L — ABNORMAL LOW (ref 135–145)

## 2021-12-19 MED ORDER — SODIUM CHLORIDE 0.9 % IV SOLN
INTRAVENOUS | Status: AC
Start: 2021-12-19 — End: 2021-12-19

## 2021-12-19 NOTE — Progress Notes (Addendum)
Patient is alert this shift but has some tremors, agitation, and an unsteady gait. CIWA protocol followed and Ativan administered. His speech is clear. Urine is amber in color and urinary output is recorded in Flow sheets. Patient is cooperative and follows commands.  Patient had urinary output this shift. Will inform oncoming nurse at change of shift.

## 2021-12-20 DIAGNOSIS — R441 Visual hallucinations: Secondary | ICD-10-CM

## 2021-12-20 DIAGNOSIS — T50901D Poisoning by unspecified drugs, medicaments and biological substances, accidental (unintentional), subsequent encounter: Secondary | ICD-10-CM

## 2021-12-20 DIAGNOSIS — T43625D Adverse effect of amphetamines, subsequent encounter: Secondary | ICD-10-CM

## 2021-12-20 LAB — SODIUM
Sodium: 134 mmol/L — ABNORMAL LOW (ref 135–145)
Sodium: 135 mmol/L (ref 135–145)
Sodium: 135 mmol/L (ref 135–145)

## 2021-12-20 MED ORDER — CHLORDIAZEPOXIDE HCL 5 MG PO CAPS
5.0000 mg | ORAL_CAPSULE | Freq: Once | ORAL | Status: AC
  Administered 2021-12-20: 5 mg via ORAL
  Filled 2021-12-20: qty 1

## 2021-12-20 MED ORDER — LORAZEPAM 2 MG/ML IJ SOLN
1.0000 mg | INTRAMUSCULAR | Status: DC | PRN
  Administered 2021-12-20: 2 mg via INTRAVENOUS
  Administered 2021-12-20: 1 mg via INTRAVENOUS
  Administered 2021-12-20 (×2): 2 mg via INTRAVENOUS
  Administered 2021-12-20: 1 mg via INTRAVENOUS
  Administered 2021-12-21: 2 mg via INTRAVENOUS
  Administered 2021-12-21: 3 mg via INTRAVENOUS
  Filled 2021-12-20 (×6): qty 1
  Filled 2021-12-20: qty 2
  Filled 2021-12-20 (×2): qty 1

## 2021-12-20 MED ORDER — OXYCODONE HCL 5 MG PO TABS
10.0000 mg | ORAL_TABLET | Freq: Once | ORAL | Status: AC
  Administered 2021-12-20: 10 mg via ORAL
  Filled 2021-12-20: qty 2

## 2021-12-20 MED ORDER — OXYCODONE HCL 5 MG PO TABS
5.0000 mg | ORAL_TABLET | Freq: Once | ORAL | Status: AC
  Administered 2021-12-20: 5 mg via ORAL
  Filled 2021-12-20: qty 1

## 2021-12-20 MED ORDER — LORAZEPAM 1 MG PO TABS
1.0000 mg | ORAL_TABLET | Freq: Once | ORAL | Status: AC
  Administered 2021-12-20: 1 mg via ORAL
  Filled 2021-12-20: qty 1

## 2021-12-20 MED ORDER — NICOTINE 21 MG/24HR TD PT24
21.0000 mg | MEDICATED_PATCH | Freq: Every day | TRANSDERMAL | Status: DC
  Administered 2021-12-20 – 2021-12-23 (×4): 21 mg via TRANSDERMAL
  Filled 2021-12-20 (×4): qty 1

## 2021-12-20 MED ORDER — DICLOFENAC SODIUM 1 % EX GEL
2.0000 g | Freq: Four times a day (QID) | CUTANEOUS | Status: AC
  Administered 2021-12-20 (×4): 2 g via TOPICAL
  Filled 2021-12-20: qty 100

## 2021-12-20 MED ORDER — THIAMINE HCL 100 MG/ML IJ SOLN
500.0000 mg | Freq: Three times a day (TID) | INTRAVENOUS | Status: DC
  Administered 2021-12-20 – 2021-12-22 (×7): 500 mg via INTRAVENOUS
  Filled 2021-12-20 (×9): qty 5

## 2021-12-20 MED ORDER — LORAZEPAM 1 MG PO TABS
1.0000 mg | ORAL_TABLET | ORAL | Status: DC | PRN
  Administered 2021-12-20: 2 mg via ORAL
  Filled 2021-12-20: qty 2
  Filled 2021-12-20: qty 4

## 2021-12-20 NOTE — Progress Notes (Signed)
Patient continues to have hallucinations and delusions and continues to try to leave hospital.   Kennon Holter was order because patient is noncompliant with safety protocols.  Refuse to remain in bed or use the call bell.  His movements are fast/jerky and unsteady.   Patient continues to be agitated despite multiple dose of ativan.

## 2021-12-20 NOTE — Progress Notes (Signed)
PROGRESS NOTE  Luke Rogers.    DOB: [birth year], 61 y.o.  TDV:[id number]    Code Status: Full Code   DOA: 12/17/2021   LOS: 2   Brief hospital course  Luke Vigo. is a 61 y.o. male with a PMH significant for Alcohol use disorder, alcoholic pancreatitis, alcoholic steatosis and history of hepatitis C antibody positive in 2019.  They presented from home to the ED on 12/17/2021 with hallucinations and acute intoxication of amphetamines. He states that he drinks 12 beers per day.   In the ED, it was found that they were tachycardic to 130, tachypneic to 24 with BP 181/96 and otherwise normal vitals.  Significant findings included Sodium 125, bicarb 19, AST 104 and total bili 2.7.  EtOH and salicylate levels undetectable and EtOH level 15.  CBC WNL  EKG- Sinus tachycardia at 126 with no acute ST-T wave changes.   UDS positive for amphetamines..  They were initially treated with librium and IV fluids.   Patient was admitted to medicine service for further workup and management of acute amphetamine intoxication as outlined in detail below.  12/20/21 -worsened withdrawal symptoms. Resolved hyponatremia.  Assessment & Plan  Principal Problem:   Hyponatremia Active Problems:   Amphetamine adverse reaction, initial encounter   Metabolic acidosis   Visual hallucinations   Alcohol use disorder   Hyperbilirubinemia   Amphetamine adverse reaction   Accidental drug overdose  Amphetamine intoxication- patient states it was unintentional. Vitals have stabilized. Likely resolved   Acute withdrawal  Alcohol use disorder Possible Wernicke's encephalopathy- no h/o seizures. Endorses 12 beers per day. EtOH level 15 on admission. Today, patient's symptoms have worsened. Having hallucinations and can endorse that they are not real but also has some delusions. Attempting to leave hospital. Tremulous - increased CIWA monitoring frequency and ativan dosing - librium x1 today - safety  sitter - high dose thiamine treatment ordered - counseling for detox provided - PT/OT  Hyponatremia- likely related to potomania. Resolved. Na+ 125>>>135 and stable.  - BMP am - encourage PO hydration - frequent neuro checks  Thrombocytopenia- likely related to liver disease. No active bleeding. Platelets 102  Neck pain- suspect muscular strain. Not torticollis  - oxy x1 to avoid sedation - voltaren gel - heating pad  Body mass index is 21.25 kg/m.  VTE ppx: enoxaparin (LOVENOX) injection 40 mg Start: 12/18/21 0800  Diet:     Diet   Diet Heart Room service appropriate? Yes; Fluid consistency: Thin   Consultants: None  Subjective 12/20/21    Pt reports right neck pain and lower back pain. No injuries. No ROM decrease.  Patient's wife is at bedside and she is very fearful of how he is behaving.   Objective   Vitals:   12/20/21 0104 12/20/21 0330 12/20/21 0814 12/20/21 1153  BP: 126/87 127/83 114/79 123/86  Pulse: 91 98 (!) 104 88  Resp:  '18 14 18  '$ Temp:  98 F (36.7 C)  (!) 97.5 F (36.4 C)  TempSrc:      SpO2:  96% 97% 97%  Weight:      Height:        Intake/Output Summary (Last 24 hours) at 12/20/2021 1439 Last data filed at 12/20/2021 0900 Gross per 24 hour  Intake 1411.5 ml  Output 0 ml  Net 1411.5 ml   Filed Weights   12/17/21 2335  Weight: 77.1 kg     Physical Exam:  General: awake, alert, NAD. Mildly anxious Respiratory:  normal respiratory effort. Cardiovascular: quick capillary refill Nervous: A&O x2. Tremulous. Endorses hallucinations. Shuffling gate with walker Extremities: moves all equally, no edema, normal tone MSK- tenderness to right paraspinal musculature, ROM intact.  Skin: dry, intact, normal temperature, normal color. No rashes, lesions or ulcers on exposed skin  Labs   I have personally reviewed the following labs and imaging studies CBC    Component Value Date/Time   WBC 8.6 12/17/2021 2338   RBC 4.93 12/17/2021 2338    HGB 16.3 12/17/2021 2338   HCT 46.4 12/17/2021 2338   PLT 102 (L) 12/17/2021 2338   MCV 94.1 12/17/2021 2338   MCH 33.1 12/17/2021 2338   MCHC 35.1 12/17/2021 2338   RDW 12.7 12/17/2021 2338   LYMPHSABS 2.5 01/20/2021 0538   MONOABS 0.8 01/20/2021 0538   EOSABS 0.5 01/20/2021 0538   BASOSABS 0.1 01/20/2021 0538      Latest Ref Rng & Units 12/20/2021   12:03 PM 12/20/2021    5:24 AM 12/19/2021   11:50 PM  BMP  Sodium 135 - 145 mmol/L 134   135   135      No results found.  Disposition Plan & Communication  Patient status: Inpatient  Admitted From: Home Planned disposition location: Home Anticipated discharge date: 6/1 pending resolution of withdrawal symptoms  Family Communication: wife at bedside    Author: Richarda Osmond, DO [facility] 12/20/2021, 2:39 PM   Available by Epic secure chat 7AM-7PM. If 7PM-7AM, please contact night-coverage.  [facility] contact information found on [email or web address].

## 2021-12-20 NOTE — TOC Progression Note (Signed)
Transition of Care (TOC) - Progression Note    Patient Details  Name: Luke Rogers. MRN: [id number] Date of Birth: 03/07/1961  Transition of Care [employer]) CM/SW Contact  Vickki Muff, [place] Phone Number: 12/20/2021, 12:48 PM  Clinical Narrative:  CSW went by room to give resources to pt. Fiance said that now wasn't a good time. Put on front of the chart.          Expected Discharge Plan and Services                                                 Social Determinants of Health (SDOH) Interventions    Readmission Risk Interventions     View : No data to display.

## 2021-12-20 NOTE — Progress Notes (Signed)
Patient is very agitated, refusing to get in bed, walking out of room trying to leave.   Wife at bedside, but patient threw wife jacket and keys down hall and started cussing at her.   She was able to get him to go back to the room.   Page MD to let her know.   MD reordered ativan.

## 2021-12-20 NOTE — Progress Notes (Signed)
Patient discusses with his nurse about requesting a psych consult. He says he would like to talk to someone. Will inform oncoming nurse in the morning.  Ativan injection 0-4 mg every 6 hours has been discontinued on MAR. Ativan every 12 hours is active on MAR.  Patient had mild tremors at initial assessment. CIWA score at start of shift was 4 at the time. This is his 3rd day on the unit. CIWA score at 4:00 am after 1 am administration of Ativan is 5.  Will continue to monitor for any changes in and vitals signs and exacerbation of symptoms  Will inform NP.

## 2021-12-20 NOTE — Progress Notes (Addendum)
Patient complaining about severe pain in his neck.   MD ordered one time dose of oxycodone, voltaren topical gel and a k-pad.   Unable to get a k-pad at this time- per portable they are out of them.    MD also ordered a nicotine patch for this patient.

## 2021-12-20 NOTE — Progress Notes (Signed)
       CROSS COVER NOTE  NAME: Luke Rogers. MRN: [id number] DOB : 19-Aug-1960  Contacted by nursing reporting CIWA score of (5) 1 hour after Ativan administration. He scored for "tremors, restlessness, anxiety, he keeps stating that he is seeing bees on his whiteboard. " Corrected score is 8 to include visual hallucinations. 1 mg PO ativan ordered.  Neomia Glass DNP, MHA, FNP-BC Nurse Practitioner [facility] Pager [phone]

## 2021-12-20 NOTE — Progress Notes (Signed)
At 1:00 am, patient states he is having tremors and seeing things. Patient presents with symptoms and a new CIWA score of 8.   Earlier in the shift, patient kept requesting Ativan, stating that the day nurse from the previous shift did not give any to him. It was explained that he had to meet certain criteria to get it and it was not administered because he did not meet the criteria. Spouse present in room and asked "can he just get it anyway?" It was explained again that he had to meet certain criteria to get it.  CIWA score at 8:30 pm prior to the 1:30 am  assessment was 4. Patient did not receive any Ativan at that time. Patient asks nurse if not receiving the medication will require him to be discharged in the morning. "Will I have to go home?" Patient is informed that if he has any questions or concerns regarding his progress, the doctor would be happy to address them in the morning.  At midnight, patient was assessed. Vital signs stable at the time. Patient had a steady gait and no hallucinations or visual/ auditory disturbances/ impairments.   At 1:00 am, patient states the auditory impairments mentioned above. After a full assessment, 1 mg of Ativan administered at 1:30 am.  Neomia Glass, NP informed.

## 2021-12-20 NOTE — Progress Notes (Signed)
NP placed an order. '1mg'$  of Ativan administered to cover CIWA score of 5 at 4:00 am assessment.

## 2021-12-21 DIAGNOSIS — D696 Thrombocytopenia, unspecified: Secondary | ICD-10-CM

## 2021-12-21 DIAGNOSIS — F10932 Alcohol use, unspecified with withdrawal with perceptual disturbance: Secondary | ICD-10-CM

## 2021-12-21 DIAGNOSIS — F15921 Other stimulant use, unspecified with intoxication delirium: Secondary | ICD-10-CM

## 2021-12-21 LAB — CBC
HCT: 38.2 % — ABNORMAL LOW (ref 39.0–52.0)
Hemoglobin: 13.3 g/dL (ref 13.0–17.0)
MCH: 32.9 pg (ref 26.0–34.0)
MCHC: 34.8 g/dL (ref 30.0–36.0)
MCV: 94.6 fL (ref 80.0–100.0)
Platelets: 105 10*3/uL — ABNORMAL LOW (ref 150–400)
RBC: 4.04 MIL/uL — ABNORMAL LOW (ref 4.22–5.81)
RDW: 13.2 % (ref 11.5–15.5)
WBC: 5 10*3/uL (ref 4.0–10.5)
nRBC: 0 % (ref 0.0–0.2)

## 2021-12-21 LAB — AMMONIA: Ammonia: 40 umol/L — ABNORMAL HIGH (ref 9–35)

## 2021-12-21 LAB — COMPREHENSIVE METABOLIC PANEL
ALT: 40 U/L (ref 0–44)
AST: 103 U/L — ABNORMAL HIGH (ref 15–41)
Albumin: 3 g/dL — ABNORMAL LOW (ref 3.5–5.0)
Alkaline Phosphatase: 64 U/L (ref 38–126)
Anion gap: 6 (ref 5–15)
BUN: 10 mg/dL (ref 6–20)
CO2: 24 mmol/L (ref 22–32)
Calcium: 8.6 mg/dL — ABNORMAL LOW (ref 8.9–10.3)
Chloride: 104 mmol/L (ref 98–111)
Creatinine, Ser: 0.51 mg/dL — ABNORMAL LOW (ref 0.61–1.24)
GFR, Estimated: >60 mL/min (ref >60)
Glucose, Bld: 91 mg/dL (ref 70–99)
Potassium: 3.6 mmol/L (ref 3.5–5.1)
Sodium: 134 mmol/L — ABNORMAL LOW (ref 135–145)
Total Bilirubin: 1.9 mg/dL — ABNORMAL HIGH (ref 0.3–1.2)
Total Protein: 7.6 g/dL (ref 6.5–8.1)

## 2021-12-21 LAB — PHOSPHORUS: Phosphorus: 4.6 mg/dL (ref 2.5–4.6)

## 2021-12-21 LAB — LIPASE, BLOOD: Lipase: 38 U/L (ref 11–51)

## 2021-12-21 LAB — MAGNESIUM: Magnesium: 1.7 mg/dL (ref 1.7–2.4)

## 2021-12-21 MED ORDER — HALOPERIDOL LACTATE 5 MG/ML IJ SOLN
5.0000 mg | Freq: Once | INTRAMUSCULAR | Status: AC
  Administered 2021-12-21: 5 mg via INTRAMUSCULAR
  Filled 2021-12-21: qty 1

## 2021-12-21 MED ORDER — LORAZEPAM 1 MG PO TABS: 1.0000 mg | ORAL_TABLET | ORAL | Status: DC | PRN

## 2021-12-21 MED ORDER — LORAZEPAM 2 MG/ML IJ SOLN
1.0000 mg | INTRAMUSCULAR | Status: DC | PRN
  Administered 2021-12-21 (×4): 2 mg via INTRAMUSCULAR
  Filled 2021-12-21 (×4): qty 1

## 2021-12-21 MED ORDER — HALOPERIDOL LACTATE 5 MG/ML IJ SOLN
5.0000 mg | Freq: Once | INTRAMUSCULAR | Status: DC
Start: 2021-12-21 — End: 2021-12-21

## 2021-12-21 MED ORDER — LORAZEPAM 2 MG/ML IJ SOLN
1.0000 mg | INTRAMUSCULAR | Status: DC | PRN
  Administered 2021-12-21 – 2021-12-22 (×2): 2 mg via INTRAVENOUS
  Filled 2021-12-21 (×2): qty 1

## 2021-12-21 NOTE — Progress Notes (Signed)
Patient scored a CIWA of 19. 3 mg of Ativan IV administered. Patient was belligerent, trying to tear his shirt off, He was noncompliant, incomprehensible, and fighting and pushing. He initially pulled out his IV earlier in the shift and it was replaced. Later this shift. . When he pulled out the replacement and there was blood in the bedroom and bathroom. On several instances the patient attempted to leave.   Haloperidol '5mg'$  Injection was administered. Patient's vitals in [place]. CIWA score below. Charge Nurse Carmelia Roller., RN present, Patient's nurse Lauralyn Primes., RN present in patient's room Neomia Glass, Nurse Practitioner informed  See CIWA scale below    12/21/21 0322  CIWA-Ar  Nausea and Vomiting 0  Tactile Disturbances 0  Tremor 5  Auditory Disturbances 2  Paroxysmal Sweats 1  Visual Disturbances 2  Anxiety 3  Headache, Fullness in Head 0  Agitation 5  Orientation and Clouding of Sensorium 1  CIWA-Ar Total 19

## 2021-12-21 NOTE — Progress Notes (Signed)
Pt woke up form nap and started to attempt to get out of bed, writer offered pt his lunch which he said yes to. Writer fed him only 1 bite of spaghetti and 2 pineapple chunks, every time writer would bring the food to pt's mouth, pt would swat at [employer] or would jump at any Risk manager did while feeding pt. Pt then attempted to get out of bed, Probation officer called an Therapist, sports to call for pt's nurse. Writer, NT and RN helped pt back on his bed.

## 2021-12-21 NOTE — Progress Notes (Signed)
Pt's wife,Cheryl, visited pt from 1100-1245. Pt remained asleep for most of shift, slightly woke up and mentioned that he had to go to work. Writer and wife redirected pt back into his bed, pt mentioned about using the BR. Writer made pt aware that pt must not get up, needs to use urinal, after several redirections.. pt used urinal and went back to sleep

## 2021-12-21 NOTE — Progress Notes (Addendum)
       CROSS COVER NOTE  NAME: [name]. MRN: [id number] DOB : August 20, 1960    Date of Service   12/21/2021  HPI/Events of Note   0030: Staff requested I come to bedside. On arrival Mr Leavell was standing in his room tremulous and unsteady, he endorses visual hallucinations and is intermittently seeing bees on the wall. He has been pacing in the hallway with a goal of leaving the hospital/going home. He was last administered Lorazepam at 2153. CIWA at 2318 was 9 without administration of PRN Ativan. Mr Arey is easily redirected and cooperative at this time, he sat in bed and talked with me for 20 mins while the IV RN attempted to replace his IV. This required three attempts and he remained cooperative throughout.  0405: Secure chat received from nursing: "patient is belligerent, he was fighting 3 of the nurses at the same time who were trying to assist him into bed so she wouldn't fall, including mr. He attempted to leave his room and we had to prevent him from doing so because he was becoming violent. He was incomprehensible. Thee is blood in patient's bedroom from when he pulled out IV just now. This is the second IV he has taken out. He is very unsteady, lots of sweats and confusion"  On arrival to bedside Mr Patti is standing near bathroom door and attempting to leave the room. His gait is still unsteady. Myself and the rapid RN asked patient to come back into room and he obliged, sat on the bed, and allowed [place] to assist him in changing his bloody clothes from where he removed his IV. At the time of my arrival to bedside Mr Strick was not belligerent and was no longer fighting staff. He remains impulsive and continuing to attempt to get out of bed at times with a stated goal of going home.  Interventions   Plan: 5 mg IM Haldol Continue CIWA Ativan Protocol as ordered to include reassessment 1hr after Ativan administration and redose per orders Safety sitter       Neomia Glass DNP,  MHA, FNP-BC Nurse Practitioner [facility] Pager [phone]

## 2021-12-21 NOTE — Hospital Course (Signed)
61 year old M with PMH of alcohol use disorder, alcoholic pancreatitis, hepatic steatosis and hepatitis C presenting with visual hallucination and acute intoxication with amphetamine, and admitted for amphetamine intoxication and acute alcohol withdrawal.  He was started on CIWA and high-dose thiamine for possible Warnicke encephalopathy.  He was confused, agitated and combative requiring increased dose of IV Ativan and safety sitter.

## 2021-12-21 NOTE — Assessment & Plan Note (Signed)
Likely due to alcohol. Recheck CBC at follow-up.

## 2021-12-21 NOTE — Progress Notes (Signed)
PROGRESS NOTE  [name]. EVO:[id number] DOB: October 13, 1960   PCP: Patient, No Pcp Per (Inactive)  Patient is from: Home.   DOA: 12/17/2021 LOS: 3  Chief complaints Chief Complaint  Patient presents with   Medication Reaction     Brief Narrative / Interim history: 61 year old M with PMH of alcohol use disorder, alcoholic pancreatitis, hepatic steatosis and hepatitis C presenting with visual hallucination and acute intoxication with amphetamine, and admitted for amphetamine intoxication and acute alcohol withdrawal.  He was started on CIWA and high-dose thiamine for possible Warnicke encephalopathy.  He was confused, agitated and combative requiring increased dose of IV Ativan and safety sitter.   Subjective: Seen and examined earlier this morning.  He was agitated and combative overnight.  Ativan frequency increased.  Patient was somnolent after doses of IV Ativan.  No respiratory distress.  Patient's significant other at bedside.  Objective: Vitals:   12/21/21 1411 12/21/21 1559 12/21/21 1624 12/21/21 1701  BP: (!) 158/90 (!) 146/92 130/86 110/86  Pulse: (!) 101 91 89 88  Resp:      Temp:    98.3 F (36.8 C)  TempSrc:    Oral  SpO2:      Weight:      Height:        Examination:  GENERAL: No apparent distress.  Nontoxic. HEENT: MMM.  Vision and hearing grossly intact.  NECK: Supple.  No apparent JVD.  RESP:  No IWOB.  Fair aeration bilaterally. CVS:  RRR. Heart sounds normal.  ABD/GI/GU: BS+. Abd soft, NTND.  MSK/EXT:  Moves extremities. No apparent deformity. No edema.  SKIN: no apparent skin lesion or wound NEURO: Sleepy.  No apparent focal neuro deficit. PSYCH: Calm. Normal affect.   Procedures:  None  Microbiology summarized: None  Assessment and Plan: * Alcohol withdrawal hallucinosis (HCC) CIWA ranges from 13-16.   CIWA with as needed Ativan.  Ativan frequency increased due to agitation. Continue high-dose thiamine Continue multivitamin and  folic acid Continue safety sitter Low threshold to move to ICU for Precedex.  Amphetamine intoxication ([place]) UDS positive for amphetamine. -We will counsel once able to comprehend  Visual hallucinations Likely due to amphetamine and alcohol withdrawal -Management as above  Metabolic acidosis Likely due to alcohol and amphetamine use.  Resolved.  Hyponatremia Likely beer potomania.  Improved. -Continue monitoring  Hyperbilirubinemia Likely due to alcohol. -Continue monitoring  Thrombocytopenia (HCC) Likely due to alcohol. -Monitor   DVT prophylaxis:  enoxaparin (LOVENOX) injection 40 mg Start: 12/18/21 0800  Code Status: Full code Family Communication: Updated patient's significant other at bedside. Level of care: Progressive Status is: Inpatient Remains inpatient appropriate because: Alcohol withdrawal syndrome   Final disposition: TBD Consultants:  None  Sch Meds:  Scheduled Meds:  enoxaparin (LOVENOX) injection  40 mg Subcutaneous E99B   folic acid  1 mg Oral Daily   multivitamin with minerals  1 tablet Oral Daily   nicotine  21 mg Transdermal Daily   Continuous Infusions:  thiamine injection 500 mg (12/21/21 1545)   PRN Meds:.acetaminophen **OR** acetaminophen, LORazepam **OR** LORazepam, ondansetron **OR** ondansetron (ZOFRAN) IV  Antimicrobials: Anti-infectives (From admission, onward)    None        I have personally reviewed the following labs and images: CBC: Recent Labs  Lab 12/17/21 2338 12/21/21 0952  WBC 8.6 5.0  HGB 16.3 13.3  HCT 46.4 38.2*  MCV 94.1 94.6  PLT 102* 105*   BMP &GFR Recent Labs  Lab 12/18/21 1525 12/18/21 2020 12/19/21  [id number] 12/19/21 0530 12/19/21 1204 12/19/21 1824 12/19/21 2350 12/20/21 0524 12/20/21 1203 12/21/21 0952  NA 129* 131* 129* 131*   < > 132* 135 135 134* 134*  K 3.8 3.8 3.6 3.7  --   --   --   --   --  3.6  CL 100 99 99 100  --   --   --   --   --  104  CO2 '24 25 23 25  '$ --   --   --    --   --  24  GLUCOSE 91 114* 97 99  --   --   --   --   --  91  BUN '11 11 10 9  '$ --   --   --   --   --  10  CREATININE 0.61 0.65 0.63 0.69  --   --   --   --   --  0.51*  CALCIUM 8.1* 8.4* 8.2* 8.3*  --   --   --   --   --  8.6*  MG  --   --   --   --   --   --   --   --   --  1.7  PHOS  --   --   --   --   --   --   --   --   --  4.6   < > = values in this interval not displayed.   Estimated Creatinine Clearance: 109.8 mL/min (A) (by C-G formula based on SCr of 0.51 mg/dL (L)). Liver & Pancreas: Recent Labs  Lab 12/17/21 2338 12/21/21 0952  AST 104* 103*  ALT 34 40  ALKPHOS 91 64  BILITOT 2.7* 1.9*  PROT 9.5* 7.6  ALBUMIN 3.8 3.0*   Recent Labs  Lab 12/21/21 0952  LIPASE 38   Recent Labs  Lab 12/21/21 0952  AMMONIA 40*   Diabetic: No results for input(s): HGBA1C in the last 72 hours. No results for input(s): GLUCAP in the last 168 hours. Cardiac Enzymes: No results for input(s): CKTOTAL, CKMB, CKMBINDEX, TROPONINI in the last 168 hours. No results for input(s): PROBNP in the last 8760 hours. Coagulation Profile: No results for input(s): INR, PROTIME in the last 168 hours. Thyroid Function Tests: No results for input(s): TSH, T4TOTAL, FREET4, T3FREE, THYROIDAB in the last 72 hours. Lipid Profile: No results for input(s): CHOL, HDL, LDLCALC, TRIG, CHOLHDL, LDLDIRECT in the last 72 hours. Anemia Panel: No results for input(s): VITAMINB12, FOLATE, FERRITIN, TIBC, IRON, RETICCTPCT in the last 72 hours. Urine analysis:    Component Value Date/Time   COLORURINE AMBER (A) 01/17/2021 1120   APPEARANCEUR CLEAR (A) 01/17/2021 1120   LABSPEC >1.046 (H) 01/17/2021 1120   PHURINE 6.0 01/17/2021 1120   GLUCOSEU NEGATIVE 01/17/2021 1120   HGBUR NEGATIVE 01/17/2021 [place] 01/17/2021 [place] 01/17/2021 1120   PROTEINUR NEGATIVE 01/17/2021 1120   NITRITE NEGATIVE 01/17/2021 [place] 01/17/2021 1120   Sepsis  Labs: Invalid input(s): PROCALCITONIN, [place]  Microbiology: No results found for this or any previous visit (from the past 240 hour(s)).  Radiology Studies: No results found.    Keylan Costabile T. Duran  If 7PM-7AM, please contact night-coverage [email or web address] 12/21/2021, 5:43 PM

## 2021-12-22 DIAGNOSIS — K219 Gastro-esophageal reflux disease without esophagitis: Secondary | ICD-10-CM

## 2021-12-22 DIAGNOSIS — Z720 Tobacco use: Secondary | ICD-10-CM

## 2021-12-22 HISTORY — DX: Hypomagnesemia: E83.42

## 2021-12-22 LAB — COMPREHENSIVE METABOLIC PANEL
ALT: 41 U/L (ref 0–44)
AST: 104 U/L — ABNORMAL HIGH (ref 15–41)
Albumin: 2.8 g/dL — ABNORMAL LOW (ref 3.5–5.0)
Alkaline Phosphatase: 68 U/L (ref 38–126)
Anion gap: 8 (ref 5–15)
BUN: 10 mg/dL (ref 6–20)
CO2: 21 mmol/L — ABNORMAL LOW (ref 22–32)
Calcium: 8.3 mg/dL — ABNORMAL LOW (ref 8.9–10.3)
Chloride: 106 mmol/L (ref 98–111)
Creatinine, Ser: 0.66 mg/dL (ref 0.61–1.24)
GFR, Estimated: >60 mL/min (ref >60)
Glucose, Bld: 102 mg/dL — ABNORMAL HIGH (ref 70–99)
Potassium: 3.6 mmol/L (ref 3.5–5.1)
Sodium: 135 mmol/L (ref 135–145)
Total Bilirubin: 1.4 mg/dL — ABNORMAL HIGH (ref 0.3–1.2)
Total Protein: 7.2 g/dL (ref 6.5–8.1)

## 2021-12-22 LAB — PROTIME-INR
INR: 1.4 — ABNORMAL HIGH (ref 0.8–1.2)
Prothrombin Time: 16.5 seconds — ABNORMAL HIGH (ref 11.4–15.2)

## 2021-12-22 LAB — CK: Total CK: 53 U/L (ref 49–397)

## 2021-12-22 LAB — AMMONIA: Ammonia: 53 umol/L — ABNORMAL HIGH (ref 9–35)

## 2021-12-22 LAB — LIPASE, BLOOD: Lipase: 42 U/L (ref 11–51)

## 2021-12-22 LAB — PHOSPHORUS: Phosphorus: 3.9 mg/dL (ref 2.5–4.6)

## 2021-12-22 LAB — MAGNESIUM: Magnesium: 1.6 mg/dL — ABNORMAL LOW (ref 1.7–2.4)

## 2021-12-22 MED ORDER — LORAZEPAM 1 MG PO TABS: 1.0000 mg | ORAL_TABLET | ORAL | Status: DC | PRN

## 2021-12-22 MED ORDER — MAGNESIUM SULFATE 2 GM/50ML IV SOLN
2.0000 g | Freq: Once | INTRAVENOUS | Status: AC
Start: 2021-12-22 — End: 2021-12-22
  Administered 2021-12-22: 2 g via INTRAVENOUS
  Filled 2021-12-22: qty 50

## 2021-12-22 MED ORDER — LORAZEPAM 2 MG/ML IJ SOLN: 1.0000 mg | INTRAMUSCULAR | Status: DC | PRN

## 2021-12-22 MED ORDER — KCL-LACTATED RINGERS-D5W 20 MEQ/L IV SOLN
INTRAVENOUS | Status: DC
  Filled 2021-12-22 (×5): qty 1000

## 2021-12-22 MED ORDER — ALUM & MAG HYDROXIDE-SIMETH 200-200-20 MG/5ML PO SUSP
30.0000 mL | Freq: Three times a day (TID) | ORAL | Status: DC | PRN
  Administered 2021-12-22: 30 mL via ORAL
  Filled 2021-12-22: qty 30

## 2021-12-22 MED ORDER — LACTULOSE 10 GM/15ML PO SOLN
20.0000 g | Freq: Two times a day (BID) | ORAL | Status: DC
  Administered 2021-12-22 – 2021-12-23 (×3): 20 g via ORAL
  Filled 2021-12-22 (×3): qty 30

## 2021-12-22 MED ORDER — THIAMINE HCL 100 MG PO TABS
100.0000 mg | ORAL_TABLET | Freq: Every day | ORAL | Status: DC
  Administered 2021-12-23: 100 mg via ORAL
  Filled 2021-12-22: qty 1

## 2021-12-22 MED ORDER — PANTOPRAZOLE SODIUM 40 MG PO TBEC
40.0000 mg | DELAYED_RELEASE_TABLET | Freq: Every day | ORAL | Status: DC
Start: 2021-12-22 — End: 2021-12-23
  Administered 2021-12-22 – 2021-12-23 (×2): 40 mg via ORAL
  Filled 2021-12-22 (×2): qty 1

## 2021-12-22 NOTE — Plan of Care (Signed)

## 2021-12-22 NOTE — Progress Notes (Addendum)
PROGRESS NOTE  [name]. NGE:[id number] DOB: Nov 12, 1960   PCP: Patient, No Pcp Per (Inactive)  Patient is from: Home.   DOA: 12/17/2021 LOS: 4  Chief complaints Chief Complaint  Patient presents with   Medication Reaction     Brief Narrative / Interim history: 61 year old M with PMH of alcohol use disorder, alcoholic pancreatitis, hepatic steatosis and hepatitis C presenting with visual hallucination and acute intoxication with amphetamine, and admitted for amphetamine intoxication and acute alcohol withdrawal.  He was started on CIWA and high-dose thiamine for possible [place]ke encephalopathy.  He was confused, agitated and combative requiring increased dose of IV Ativan and safety sitter.  Seems to be improving now.   Subjective: Seen and examined earlier this morning.  No major events overnight of this morning.  No report of agitation or combativeness last night of this morning.  He was awake and ate his breakfast this morning.  Per [employer] cabin crew, he was appropriate and apologetic.  He went back to sleep after breakfast.  Objective: Vitals:   12/21/21 2032 12/22/21 0420 12/22/21 0740 12/22/21 1114  BP: 113/67 (!) 91/58 135/75 101/62  Pulse: 88 80 75 82  Resp: '18  18 18  '$ Temp: 99 F (37.2 C) 99 F (37.2 C) 97.6 F (36.4 C) 98.1 F (36.7 C)  TempSrc:  Oral    SpO2: 96% 97% 96% 95%  Weight:      Height:        Examination:  GENERAL: No apparent distress.  Nontoxic. HEENT: MMM.  Vision and hearing grossly intact.  NECK: Supple.  No apparent JVD.  RESP:  No IWOB.  Fair aeration bilaterally. CVS:  RRR. Heart sounds normal.  ABD/GI/GU: BS+. Abd soft, NTND.  MSK/EXT:  Moves extremities. No apparent deformity. No edema.  SKIN: no apparent skin lesion or wound NEURO: Sleepy but wakes to voice.  No apparent focal neuro deficit. PSYCH: Calm. Normal affect.   Procedures:  None  Microbiology summarized: None  Assessment and Plan: * Alcohol withdrawal  hallucinosis (HCC) Improved.  CIWA score ranges from 4-15.  Most recent fall. CIWA with as needed Ativan.  Decrease frequency to every 4 hours High-dose thiamine 500 mg 3 times daily from 5/30-6/1.  Decrease to 100 mg daily. Continue multivitamin and folic acid Ammonia level 53.  P.o. lactulose Counseling on alcohol cessation/moderation TOC consulted Reorientation and delirium precautions.  Amphetamine intoxication ([place]) UDS positive for amphetamine. -We will counsel on cessation. -TOC consult  Visual hallucinations Likely due to amphetamine and alcohol withdrawal -Management as above  Metabolic acidosis Continue monitoring.  Hyponatremia Likely beer potomania.  Resolved.  GERD (gastroesophageal reflux disease) P.o. Protonix and GI cocktail  Hypomagnesemia Mg 1.6.  IV magnesium sulfate 2 g x 1  Hyperbilirubinemia Likely due to alcohol. -Continue monitoring  Tobacco abuse Encourage cessation.  Nicotine patch.  Thrombocytopenia ([place]) Likely due to alcohol. -Monitor   DVT prophylaxis:  enoxaparin (LOVENOX) injection 40 mg Start: 12/18/21 0800  Code Status: Full code Family Communication: Updated patient's significant other at bedside. Level of care: Telemetry Medical.  Change level of care to telemetry Status is: Inpatient Remains inpatient appropriate because: Alcohol withdrawal syndrome   Final disposition: Home with home health in the next 24 to 48 hours. Consultants:  None  Sch Meds:  Scheduled Meds:  enoxaparin (LOVENOX) injection  40 mg Subcutaneous M01U   folic acid  1 mg Oral Daily   lactulose  20 g Oral BID   multivitamin with minerals  1 tablet  Oral Daily   nicotine  21 mg Transdermal Daily   pantoprazole  40 mg Oral Daily   [START ON 12/23/2021] thiamine  100 mg Oral Daily   Continuous Infusions:  dextrose 5% lactated ringers with KCl 20 mEq/L 125 mL/hr at 12/22/21 1018   magnesium sulfate bolus IVPB 2 g (12/22/21 1559)   PRN  Meds:.acetaminophen **OR** acetaminophen, alum & mag hydroxide-simeth, LORazepam **OR** LORazepam, ondansetron **OR** ondansetron (ZOFRAN) IV  Antimicrobials: Anti-infectives (From admission, onward)    None        I have personally reviewed the following labs and images: CBC: Recent Labs  Lab 12/17/21 2338 12/21/21 0952  WBC 8.6 5.0  HGB 16.3 13.3  HCT 46.4 38.2*  MCV 94.1 94.6  PLT 102* 105*   BMP &GFR Recent Labs  Lab 12/18/21 2020 12/19/21 0204 12/19/21 0530 12/19/21 1204 12/19/21 2350 12/20/21 0524 12/20/21 1203 12/21/21 0952 12/22/21 0601  NA 131* 129* 131*   < > 135 135 134* 134* 135  K 3.8 3.6 3.7  --   --   --   --  3.6 3.6  CL 99 99 100  --   --   --   --  104 106  CO2 '25 23 25  '$ --   --   --   --  24 21*  GLUCOSE 114* 97 99  --   --   --   --  91 102*  BUN '11 10 9  '$ --   --   --   --  10 10  CREATININE 0.65 0.63 0.69  --   --   --   --  0.51* 0.66  CALCIUM 8.4* 8.2* 8.3*  --   --   --   --  8.6* 8.3*  MG  --   --   --   --   --   --   --  1.7 1.6*  PHOS  --   --   --   --   --   --   --  4.6 3.9   < > = values in this interval not displayed.   Estimated Creatinine Clearance: 109.8 mL/min (by C-G formula based on SCr of 0.66 mg/dL). Liver & Pancreas: Recent Labs  Lab 12/17/21 2338 12/21/21 0952 12/22/21 0601  AST 104* 103* 104*  ALT 34 40 41  ALKPHOS 91 64 68  BILITOT 2.7* 1.9* 1.4*  PROT 9.5* 7.6 7.2  ALBUMIN 3.8 3.0* 2.8*   Recent Labs  Lab 12/21/21 0952 12/22/21 0601  LIPASE 38 42   Recent Labs  Lab 12/21/21 0952 12/22/21 0601  AMMONIA 40* 53*   Diabetic: No results for input(s): HGBA1C in the last 72 hours. No results for input(s): GLUCAP in the last 168 hours. Cardiac Enzymes: Recent Labs  Lab 12/22/21 0601  CKTOTAL 53   No results for input(s): PROBNP in the last 8760 hours. Coagulation Profile: Recent Labs  Lab 12/22/21 0601  INR 1.4*   Thyroid Function Tests: No results for input(s): TSH, T4TOTAL, FREET4,  T3FREE, THYROIDAB in the last 72 hours. Lipid Profile: No results for input(s): CHOL, HDL, LDLCALC, TRIG, CHOLHDL, LDLDIRECT in the last 72 hours. Anemia Panel: No results for input(s): VITAMINB12, FOLATE, FERRITIN, TIBC, IRON, RETICCTPCT in the last 72 hours. Urine analysis:    Component Value Date/Time   COLORURINE AMBER (A) 01/17/2021 1120   APPEARANCEUR CLEAR (A) 01/17/2021 1120   LABSPEC >1.046 (H) 01/17/2021 1120   PHURINE 6.0 01/17/2021 1120  GLUCOSEU NEGATIVE 01/17/2021 [place] 01/17/2021 [place] 01/17/2021 [place] 01/17/2021 1120   PROTEINUR NEGATIVE 01/17/2021 1120   NITRITE NEGATIVE 01/17/2021 1120   LEUKOCYTESUR NEGATIVE 01/17/2021 1120   Sepsis Labs: Invalid input(s): PROCALCITONIN, [place]  Microbiology: No results found for this or any previous visit (from the past 240 hour(s)).  Radiology Studies: No results found.    Taraya Steward T. Lyman  If 7PM-7AM, please contact night-coverage [email or web address] 12/22/2021, 4:17 PM

## 2021-12-22 NOTE — Assessment & Plan Note (Signed)
Mg 1.6.  Received IV magnesium sulfate 2 g x 1

## 2021-12-22 NOTE — Assessment & Plan Note (Addendum)
P.o. Protonix

## 2021-12-22 NOTE — Evaluation (Signed)
Occupational Therapy Evaluation Patient Details Name: Luke Rogers. MRN: [id number] DOB: [birth year] Today's Date: 12/22/2021   History of Present Illness Luke Rogers. is a 61 y.o. male with medical history significant for Alcohol use disorder, alcoholic pancreatitis, alcoholic steatosis and history of hepatitis C antibody positive in 2019 but with negative HCV DNA who presents to the ED with hallucinations.  Patient had been drinking beer all day and then took 3 unknown pills from a friend at around 2100 then started having hallucinations and became very diaphoretic.   Clinical Impression   Patient presenting with decreased Ind in self care, balance, functional mobility/transfers, endurance, and safety awareness. Patient reports living at home with wife and being independent in all aspects of care. He does not drive but wife and him go to work together. 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  Patient currently functioning at supervision with some cuing needed for safety awareness and not oriented to time this session. Pt does appear to be tremulous during session and mostly experiencing some withdrawal.  Patient will benefit from acute OT to increase overall independence in the areas of ADLs, functional mobility, and safety awareness in order to safely discharge home with family.      Recommendations for follow up therapy are one component of a multi-disciplinary discharge planning process, led by the attending physician.  Recommendations may be updated based on patient status, additional functional criteria and insurance authorization.   Follow Up Recommendations  No OT follow up    Assistance Recommended at Discharge Set up Supervision/Assistance  Patient can return home with the following A little help with walking and/or transfers;A little help with bathing/dressing/bathroom;Assistance with cooking/housework;Help with stairs or ramp for entrance    Functional Status Assessment   Patient has had a recent decline in their functional status and demonstrates the ability to make significant improvements in function in a reasonable and predictable amount of time.  Equipment Recommendations  None recommended by OT       Precautions / Restrictions Precautions Precautions: Fall Restrictions Weight Bearing Restrictions: No      Mobility Bed Mobility               General bed mobility comments: seated in recliner chair    Transfers Overall transfer level: Needs assistance   Transfers: Sit to/from Stand Sit to Stand: Supervision                  Balance Overall balance assessment: Needs assistance Sitting-balance support: No upper extremity supported, Feet supported Sitting balance-Leahy Scale: Normal     Standing balance support: No upper extremity supported Standing balance-Leahy Scale: Fair                             ADL either performed or assessed with clinical judgement   ADL Overall ADL's : Needs assistance/impaired     Grooming: Wash/dry hands;Wash/dry face;Supervision/safety;Oral care;Standing                   Toilet Transfer: Copy Details (indicate cue type and reason): simulated         Functional mobility during ADLs: Supervision/safety       Vision Patient Visual Report: No change from baseline              Pertinent Vitals/Pain Pain Assessment Pain Assessment: No/denies pain     Hand Dominance Right   Extremity/Trunk Assessment Upper Extremity Assessment Upper  Extremity Assessment: Generalized weakness   Lower Extremity Assessment Lower Extremity Assessment: Generalized weakness   Cervical / Trunk Assessment Cervical / Trunk Assessment: Normal   Communication Communication Communication: No difficulties   Cognition Arousal/Alertness: Awake/alert Behavior During Therapy: WFL for tasks assessed/performed, Flat affect Overall Cognitive Status:  Impaired/Different from baseline Area of Impairment: Orientation                 Orientation Level: Disoriented to, Time             General Comments: slow to process                Home Living Family/patient expects to be discharged to:: Private residence Living Arrangements: Spouse/significant other;Parent Available Help at Discharge: Family;Available 24 hours/day Type of Home: House Home Access: Level entry     Home Layout: Two level;Able to live on main level with bedroom/bathroom Alternate Level Stairs-Number of Steps: flight of stairs where pt's parents live   [employer] Shower/Tub: Occupational psychologist: Standard Bathroom Accessibility: Yes   Home Equipment: Conservation officer, nature (2 wheels);Cane - single point;Shower seat;Wheelchair - manual          Prior Functioning/Environment Prior Level of Function : Independent/Modified Independent               ADLs Comments: pt reports working full time and being independent for all aspects. He does not drive        OT Problem List: Decreased strength;Decreased coordination;Decreased activity tolerance;Decreased safety awareness;Impaired balance (sitting and/or standing)      OT Treatment/Interventions: Self-care/ADL training;Balance training;Therapeutic exercise;Energy conservation;Therapeutic activities;Patient/family education;Manual therapy    OT Goals(Current goals can be found in the care plan section) Acute Rehab OT Goals Patient Stated Goal: to go home OT Goal Formulation: With patient Time For Goal Achievement: 01/05/22 Potential to Achieve Goals: Good ADL Goals Pt Will Perform Grooming: Independently Pt Will Perform Lower Body Dressing: Independently Pt Will Transfer to Toilet: Independently Pt Will Perform Toileting - Clothing Manipulation and hygiene: Independently  OT Frequency: Min 2X/week       AM-PAC OT "6 Clicks" Daily Activity     Outcome Measure Help from another person  eating meals?: None Help from another person taking care of personal grooming?: None Help from another person toileting, which includes using toliet, bedpan, or urinal?: A Little Help from another person bathing (including washing, rinsing, drying)?: A Little Help from another person to put on and taking off regular upper body clothing?: None Help from another person to put on and taking off regular lower body clothing?: A Little 6 Click Score: 21   End of Session Nurse Communication: Mobility status  Activity Tolerance: Patient tolerated treatment well Patient left: in chair;with call bell/phone within reach;with chair alarm set;with nursing/sitter in room  OT Visit Diagnosis: Unsteadiness on feet (R26.81);Repeated falls (R29.6);Muscle weakness (generalized) (M62.81)                Time: 5638-9373 OT Time Calculation (min): 16 min Charges:  OT General Charges $OT Visit: 1 Visit OT Evaluation $OT Eval Low Complexity: 1 Low OT Treatments $Self Care/Home Management : 8-22 mins  Darleen Crocker, MS, OTR/L , CBIS ascom [phone]  12/22/21, 4:24 PM

## 2021-12-22 NOTE — Assessment & Plan Note (Signed)
Encouraged cessation.

## 2021-12-22 NOTE — Evaluation (Signed)
Physical Therapy Evaluation Patient Details Name: Luke Rogers. MRN: [id number] DOB: May 14, 1961 Today's Date: 12/22/2021  History of Present Illness  Luke Alewine. is a 61 y.o. male with medical history significant for Alcohol use disorder, alcoholic pancreatitis, alcoholic steatosis and history of hepatitis C antibody positive in 2019 but with negative HCV DNA who presents to the ED with hallucinations.  Patient had been drinking beer all day and then took 3 unknown pills from a friend at around 2100 then started having hallucinations and became very diaphoretic.   Clinical Impression  Pt admitted with above diagnosis. Pt received upright in bed agreeable to PT services. Pt Alert and oriented to person, place, situation but needed reorienting for time. Spouse and pt reporting PLOF, home lay out, DME, etc. 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 Pt indep in bed mobility and standing at bedside. Pt tolerating two laps around the nurses station. One lap performed with RW and supervision with consistent cadence and step through pattern. Second lap without AD and pushing IV pole in RUE. Pt demonstrates increased gait speed, mild left drifting, and intermittent variable step lengths requiring minguard from PT and education on using visual cue to ambulate in straight line. Pt also has anterior trunk lean with IV pole leading to increased gait speed as compensation to prevent anterior LOB. Pt returned to recliner with sitter present and all needs in place. Pt currently with functional limitations due to the deficits listed below (see PT Problem List). Pt will benefit from skilled PT to increase their independence and safety with mobility to allow discharge to the venue listed below.           Recommendations for follow up therapy are one component of a multi-disciplinary discharge planning process, led by the attending physician.   Recommendations may be updated based on patient status, additional functional criteria and insurance authorization.  Follow Up Recommendations Home health PT    Assistance Recommended at Discharge Intermittent Supervision/Assistance  Patient can return home with the following  A little help with walking and/or transfers;Assist for transportation;Help with stairs or ramp for entrance    Equipment Recommendations None recommended by PT  Recommendations for Other Services       Functional Status Assessment Patient has had a recent decline in their functional status and demonstrates the ability to make significant improvements in function in a reasonable and predictable amount of time.     Precautions / Restrictions Precautions Precautions: Fall Restrictions Weight Bearing Restrictions: No      Mobility  Bed Mobility Overal bed mobility: Independent               Patient Response: Cooperative, Flat affect  Transfers Overall transfer level: Independent Equipment used: None                    Ambulation/Gait Ambulation/Gait assistance: Supervision, Min guard Gait Distance (Feet): 340 Feet Assistive device: Rolling walker (2 wheels), IV Pole Gait Pattern/deviations: Step-through pattern, Drifts right/left       General Gait Details: Step through gait with mild drifting L > R. Requiring CGA for Iv pole and supervision for RW. No gross LOB  Stairs            Wheelchair Mobility    Modified [place] (Stroke Patients Only)       Balance Overall balance assessment: Needs assistance Sitting-balance support: No upper extremity supported, Feet supported Sitting  balance-Leahy Scale: Normal     Standing balance support: No upper extremity supported Standing balance-Leahy Scale: Fair Standing balance comment: Can maintain static standing without support                             Pertinent Vitals/Pain Pain Assessment Pain Assessment:  No/denies pain    Home Living Family/patient expects to be discharged to:: Private residence Living Arrangements: Spouse/significant other;Parent Available Help at Discharge: Family;Available 24 hours/day Type of Home: House Home Access: Level entry     Alternate Level Stairs-Number of Steps: flight of stairs where pt's parents live Home Layout: Two level;Able to live on main level with bedroom/bathroom Home Equipment: Rolling Walker (2 wheels);Cane - single point;Shower seat;Wheelchair - manual      Prior Function Prior Level of Function : Independent/Modified Independent                     Hand Dominance        Extremity/Trunk Assessment   Upper Extremity Assessment Upper Extremity Assessment: Defer to OT evaluation    Lower Extremity Assessment Lower Extremity Assessment: Generalized weakness    Cervical / Trunk Assessment Cervical / Trunk Assessment: Normal  Communication   Communication: No difficulties  Cognition Arousal/Alertness: Awake/alert Behavior During Therapy: WFL for tasks assessed/performed, Flat affect Overall Cognitive Status: Impaired/Different from baseline Area of Impairment: Orientation                 Orientation Level: Disoriented to, Time                      General Comments      Exercises Other Exercises Other Exercises: Role of PT in acute setting, need for AD to decrease risk of falls, d/c recs   Assessment/Plan    PT Assessment Patient needs continued PT services  PT Problem List Decreased strength;Decreased safety awareness;Decreased mobility;Decreased activity tolerance;Decreased balance;Decreased knowledge of use of DME       PT Treatment Interventions DME instruction;Therapeutic exercise;Gait training;Balance training;Stair training;Neuromuscular re-education;Functional mobility training;Therapeutic activities;Patient/family education    PT Goals (Current goals can be found in the Care Plan section)   Acute Rehab PT Goals Patient Stated Goal: to go home PT Goal Formulation: With patient Time For Goal Achievement: 01/05/22 Potential to Achieve Goals: Good    Frequency Min 2X/week     Co-evaluation               AM-PAC PT "6 Clicks" Mobility  Outcome Measure Help needed turning from your back to your side while in a flat bed without using bedrails?: None Help needed moving from lying on your back to sitting on the side of a flat bed without using bedrails?: None Help needed moving to and from a bed to a chair (including a wheelchair)?: None Help needed standing up from a chair using your arms (e.g., wheelchair or bedside chair)?: A Little Help needed to walk in hospital room?: A Little Help needed climbing 3-5 steps with a railing? : A Lot 6 Click Score: 20    End of Session Equipment Utilized During Treatment: Gait belt Activity Tolerance: Patient tolerated treatment well Patient left: with call bell/phone within reach;in chair;with chair alarm set;with nursing/sitter in room Nurse Communication: Mobility status PT Visit Diagnosis: Unsteadiness on feet (R26.81);Difficulty in walking, not elsewhere classified (R26.2);Other abnormalities of gait and mobility (R26.89);Muscle weakness (generalized) (M62.81)    Time: 4132-4401 PT Time  Calculation (min) (ACUTE ONLY): 22 min   Charges:     PT Treatments $Gait Training: 8-22 mins       Salem Caster. Fairly IV, PT, DPT Physical Therapist- [facility]  12/22/2021, 3:51 PM

## 2021-12-23 MED ORDER — MAGNESIUM SULFATE 2 GM/50ML IV SOLN
2.0000 g | Freq: Once | INTRAVENOUS | Status: AC
  Administered 2021-12-23: 2 g via INTRAVENOUS
  Filled 2021-12-23: qty 50

## 2021-12-23 MED ORDER — ADULT MULTIVITAMIN W/MINERALS CH: 1.0000 | ORAL_TABLET | Freq: Every day | ORAL | Status: DC

## 2021-12-23 MED ORDER — LACTULOSE 10 GM/15ML PO SOLN: ORAL | 0 refills | Status: DC

## 2021-12-23 MED ORDER — FOLIC ACID 1 MG PO TABS: 1.0000 mg | ORAL_TABLET | Freq: Every day | ORAL | 1 refills | Status: DC

## 2021-12-23 MED ORDER — ACETAMINOPHEN 325 MG PO TABS: 650.0000 mg | ORAL_TABLET | Freq: Four times a day (QID) | ORAL | Status: DC | PRN

## 2021-12-23 MED ORDER — THIAMINE HCL 100 MG PO TABS: 100.0000 mg | ORAL_TABLET | Freq: Every day | ORAL | 1 refills | Status: DC

## 2021-12-23 MED ORDER — PANTOPRAZOLE SODIUM 40 MG PO TBEC: 40.0000 mg | DELAYED_RELEASE_TABLET | Freq: Every day | ORAL | 0 refills | Status: DC

## 2021-12-23 MED ORDER — CHLORDIAZEPOXIDE HCL 10 MG PO CAPS: ORAL_CAPSULE | ORAL | 0 refills | Status: AC

## 2021-12-23 NOTE — Plan of Care (Signed)

## 2021-12-23 NOTE — Discharge Summary (Signed)
Physician Discharge Summary  Luke Rogers. XFG:[id number] DOB: [birth year] DOA: 12/17/2021  PCP: Patient, No Pcp Per (Inactive)  Admit date: 12/17/2021 Discharge date: 12/23/2021 Admitted From: Home Disposition: Home Recommendations for Outpatient Follow-up:  Follow ups as below. Please obtain CBC/CMP/Mag at follow up Please follow up on the following pending results: None  Home Health: PT Equipment/Devices: Not indicated  Discharge Condition: Stable CODE STATUS: Full code   Hospital course 61 year old M with PMH of alcohol use disorder, alcoholic pancreatitis, hepatic steatosis and hepatitis C presenting with visual hallucination and acute intoxication with amphetamine, and admitted for amphetamine intoxication and acute alcohol withdrawal.  He was started on CIWA and high-dose thiamine for possible Warnicke encephalopathy.  He was confused, agitated and combative requiring increased dose of IV Ativan and safety sitter.    Patient's encephalopathy and agitation now resolved.  Remained stable after decreasing IV Ativan to every 4 hours.  CIWA score remained low at 3.  He is discharged on short course of Librium taper for 3 days.  He will continue multivitamins.  He was counseled on the importance of alcohol and amphetamine cessation.  He was provided with resources.  Home health PT ordered as recommended by therapy.  Patient will be going home with significant other.  Outpatient follow-up with PCP in 1 to 2 weeks.   See individual problem list below for more on hospital course.  Problems addressed during this hospitalization Problem  Amphetamine Intoxication (Hcc)  Alcohol withdrawal hallucinosis (HCC)  Metabolic Acidosis  Visual Hallucinations  Hyponatremia  Hypomagnesemia  Gerd (Gastroesophageal Reflux Disease)  Hyperbilirubinemia  Thrombocytopenia (Hcc)  Tobacco Abuse    Assessment and Plan: * Alcohol withdrawal hallucinosis (HCC) Resolved.  Received high-dose  thiamine for 3 days. -Discharged on short course of Librium taper for 3 days -Counseled on the importance of alcohol and amphetamine cessation -Provided with resources. -Continue vitamins and folic acid  Amphetamine intoxication ([place]) UDS positive for amphetamine. Counseled on the importance of cessation and provided with resources.  Visual hallucinations Likely due to amphetamine and alcohol withdrawal.  Resolved.  Metabolic acidosis Improved.  Hyponatremia Likely beer potomania.  Resolved.  GERD (gastroesophageal reflux disease) P.o. Protonix  Hypomagnesemia Mg 1.6.  IV magnesium sulfate 2 g x 1  Hyperbilirubinemia Improved.  Check CMP at follow-up.  Tobacco abuse Encouraged cessation.   Thrombocytopenia ([place]) Likely due to alcohol. Recheck CBC at follow-up.                 Vital signs Vitals:   12/22/21 1114 12/22/21 1710 12/22/21 1930 12/23/21 0328  BP: 101/62 130/87 115/70 117/74  Pulse: 82 84 77 76  Temp: 98.1 F (36.7 C) 97.8 F (36.6 C) 97.8 F (36.6 C) 97.6 F (36.4 C)  Resp: '18 18 16 16  '$ Height:      Weight:      SpO2: 95% 95% 98% 95%  TempSrc:  Oral    BMI (Calculated):         Discharge exam  GENERAL: No apparent distress.  Nontoxic. HEENT: MMM.  Vision and hearing grossly intact.  NECK: Supple.  No apparent JVD.  RESP:  No IWOB.  Fair aeration bilaterally. CVS:  RRR. Heart sounds normal.  ABD/GI/GU: BS+. Abd soft, NTND.  MSK/EXT:  Moves extremities. No apparent deformity. No edema.  SKIN: no apparent skin lesion or wound NEURO: Sleepy but wakes to voice.  Oriented x4.  No apparent focal neuro deficit. PSYCH: Calm. Normal affect.   Discharge Instructions Discharge Instructions  Call MD for:  extreme fatigue   Complete by: As directed    Call MD for:  persistant nausea and vomiting   Complete by: As directed    Call MD for:  severe uncontrolled pain   Complete by: As directed    Diet general   Complete by: As  directed    Discharge instructions   Complete by: As directed    It has been a pleasure taking care of you!  You were hospitalized due to alcohol withdrawal symptoms and amphetamine intoxication.  Your symptoms improved to the point we think it is safe to let you go home and follow-up with your doctors.  We strongly recommend you quit drinking alcohol and using amphetamine.  Review your new medication list and the directions on your medications before you take them.  Follow-up with your primary care doctor in 1 to 2 weeks.   Take care,   Increase activity slowly   Complete by: As directed       Allergies as of 12/23/2021   No Known Allergies      Medication List     STOP taking these medications    ibuprofen 200 MG tablet Commonly known as: ADVIL       TAKE these medications    acetaminophen 325 MG tablet Commonly known as: TYLENOL Take 2 tablets (650 mg total) by mouth every 6 (six) hours as needed for mild pain (or Fever >/= 101).   chlordiazePOXIDE 10 MG capsule Commonly known as: LIBRIUM Take 1 capsule (10 mg total) by mouth 3 (three) times daily for 1 day, THEN 1 capsule (10 mg total) in the morning and at bedtime for 1 day, THEN 1 capsule (10 mg total) daily for 1 day. Start taking on: December 24, 5730   folic acid 1 MG tablet Commonly known as: FOLVITE Take 1 tablet (1 mg total) by mouth daily. Start taking on: December 24, 2021   lactulose 10 GM/15ML solution Commonly known as: CHRONULAC Take 20 g (30 cc) 1-3 times a day for a goal of 1-2 bowel movements a day   multivitamin with minerals Tabs tablet Take 1 tablet by mouth daily. Start taking on: December 24, 2021   pantoprazole 40 MG tablet Commonly known as: PROTONIX Take 1 tablet (40 mg total) by mouth daily. Start taking on: December 24, 2021   thiamine 100 MG tablet Take 1 tablet (100 mg total) by mouth daily. Start taking on: December 24, 2021        Consultations: None  Procedures/Studies:   No results  found.     The results of significant diagnostics from this hospitalization (including imaging, microbiology, ancillary and laboratory) are listed below for reference.     Microbiology: No results found for this or any previous visit (from the past 240 hour(s)).   Labs:  CBC: Recent Labs  Lab 12/17/21 2338 12/21/21 0952  WBC 8.6 5.0  HGB 16.3 13.3  HCT 46.4 38.2*  MCV 94.1 94.6  PLT 102* 105*   BMP &GFR Recent Labs  Lab 12/18/21 2020 12/19/21 0204 12/19/21 0530 12/19/21 1204 12/19/21 2350 12/20/21 0524 12/20/21 1203 12/21/21 0952 12/22/21 0601  NA 131* 129* 131*   < > 135 135 134* 134* 135  K 3.8 3.6 3.7  --   --   --   --  3.6 3.6  CL 99 99 100  --   --   --   --  104 106  CO2 '25 23 25  '$ --   --   --   --  24 21*  GLUCOSE 114* 97 99  --   --   --   --  91 102*  BUN '11 10 9  '$ --   --   --   --  10 10  CREATININE 0.65 0.63 0.69  --   --   --   --  0.51* 0.66  CALCIUM 8.4* 8.2* 8.3*  --   --   --   --  8.6* 8.3*  MG  --   --   --   --   --   --   --  1.7 1.6*  PHOS  --   --   --   --   --   --   --  4.6 3.9   < > = values in this interval not displayed.   Estimated Creatinine Clearance: 109.8 mL/min (by C-G formula based on SCr of 0.66 mg/dL). Liver & Pancreas: Recent Labs  Lab 12/17/21 2338 12/21/21 0952 12/22/21 0601  AST 104* 103* 104*  ALT 34 40 41  ALKPHOS 91 64 68  BILITOT 2.7* 1.9* 1.4*  PROT 9.5* 7.6 7.2  ALBUMIN 3.8 3.0* 2.8*   Recent Labs  Lab 12/21/21 0952 12/22/21 0601  LIPASE 38 42   Recent Labs  Lab 12/21/21 0952 12/22/21 0601  AMMONIA 40* 53*   Diabetic: No results for input(s): HGBA1C in the last 72 hours. No results for input(s): GLUCAP in the last 168 hours. Cardiac Enzymes: Recent Labs  Lab 12/22/21 0601  CKTOTAL 53   No results for input(s): PROBNP in the last 8760 hours. Coagulation Profile: Recent Labs  Lab 12/22/21 0601  INR 1.4*   Thyroid Function Tests: No results for input(s): TSH, T4TOTAL, FREET4,  T3FREE, THYROIDAB in the last 72 hours. Lipid Profile: No results for input(s): CHOL, HDL, LDLCALC, TRIG, CHOLHDL, LDLDIRECT in the last 72 hours. Anemia Panel: No results for input(s): VITAMINB12, FOLATE, FERRITIN, TIBC, IRON, RETICCTPCT in the last 72 hours. Urine analysis:    Component Value Date/Time   COLORURINE AMBER (A) 01/17/2021 1120   APPEARANCEUR CLEAR (A) 01/17/2021 1120   LABSPEC >1.046 (H) 01/17/2021 1120   PHURINE 6.0 01/17/2021 1120   GLUCOSEU NEGATIVE 01/17/2021 1120   HGBUR NEGATIVE 01/17/2021 [place] 01/17/2021 [place] 01/17/2021 1120   PROTEINUR NEGATIVE 01/17/2021 1120   NITRITE NEGATIVE 01/17/2021 [place] 01/17/2021 1120   Sepsis Labs: Invalid input(s): PROCALCITONIN, LACTICIDVEN   Time coordinating discharge: 45 minutes  SIGNED:  Mercy Riding, MD  [facility] 12/23/2021, 6:29 PM

## 2021-12-27 ENCOUNTER — Emergency Department
Admission: EM | Admit: 2021-12-27 | Discharge: 2021-12-27 | Disposition: A | Discharge status: Home / Self Care | Payer: Self-pay | Attending: Emergency Medicine | Admitting: Emergency Medicine

## 2021-12-27 ENCOUNTER — Other Ambulatory Visit: Payer: Self-pay

## 2021-12-27 ENCOUNTER — Emergency Department: Payer: Self-pay

## 2021-12-27 ENCOUNTER — Encounter: Payer: Self-pay | Admitting: Emergency Medicine

## 2021-12-27 DIAGNOSIS — L03119 Cellulitis of unspecified part of limb: Secondary | ICD-10-CM

## 2021-12-27 DIAGNOSIS — M79604 Pain in right leg: Secondary | ICD-10-CM

## 2021-12-27 DIAGNOSIS — M79661 Pain in right lower leg: Secondary | ICD-10-CM | POA: Insufficient documentation

## 2021-12-27 DIAGNOSIS — L03115 Cellulitis of right lower limb: Secondary | ICD-10-CM | POA: Insufficient documentation

## 2021-12-27 DIAGNOSIS — M7121 Synovial cyst of popliteal space [Baker], right knee: Secondary | ICD-10-CM | POA: Insufficient documentation

## 2021-12-27 LAB — CBC WITH DIFFERENTIAL/PLATELET
Abs Immature Granulocytes: 0.02 10*3/uL (ref 0.00–0.07)
Basophils Absolute: 0.1 10*3/uL (ref 0.0–0.1)
Basophils Relative: 1 %
Eosinophils Absolute: 0.1 10*3/uL (ref 0.0–0.5)
Eosinophils Relative: 1 %
HCT: 43.2 % (ref 39.0–52.0)
Hemoglobin: 14.9 g/dL (ref 13.0–17.0)
Immature Granulocytes: 0 %
Lymphocytes Relative: 33 %
Lymphs Abs: 2.9 10*3/uL (ref 0.7–4.0)
MCH: 33.2 pg (ref 26.0–34.0)
MCHC: 34.5 g/dL (ref 30.0–36.0)
MCV: 96.2 fL (ref 80.0–100.0)
Monocytes Absolute: 1 10*3/uL (ref 0.1–1.0)
Monocytes Relative: 12 %
Neutro Abs: 4.7 10*3/uL (ref 1.7–7.7)
Neutrophils Relative %: 53 %
Platelets: 189 10*3/uL (ref 150–400)
RBC: 4.49 MIL/uL (ref 4.22–5.81)
RDW: 12.7 % (ref 11.5–15.5)
WBC: 8.8 10*3/uL (ref 4.0–10.5)
nRBC: 0 % (ref 0.0–0.2)

## 2021-12-27 LAB — BASIC METABOLIC PANEL
Anion gap: 6 (ref 5–15)
BUN: 18 mg/dL (ref 6–20)
CO2: 25 mmol/L (ref 22–32)
Calcium: 9 mg/dL (ref 8.9–10.3)
Chloride: 106 mmol/L (ref 98–111)
Creatinine, Ser: 0.76 mg/dL (ref 0.61–1.24)
GFR, Estimated: >60 mL/min (ref >60)
Glucose, Bld: 108 mg/dL — ABNORMAL HIGH (ref 70–99)
Potassium: 3.5 mmol/L (ref 3.5–5.1)
Sodium: 137 mmol/L (ref 135–145)

## 2021-12-27 LAB — URIC ACID: Uric Acid, Serum: 3.8 mg/dL (ref 3.7–8.6)

## 2021-12-27 MED ORDER — CEPHALEXIN 500 MG PO CAPS: 500.0000 mg | ORAL_CAPSULE | Freq: Four times a day (QID) | ORAL | 0 refills | Status: AC

## 2021-12-27 MED ORDER — KETOROLAC TROMETHAMINE 30 MG/ML IJ SOLN
30.0000 mg | Freq: Once | INTRAMUSCULAR | Status: AC
  Administered 2021-12-27: 30 mg via INTRAMUSCULAR
  Filled 2021-12-27: qty 1

## 2021-12-27 NOTE — ED Notes (Signed)
See triage note  presents with right foot pain  states he was recently in the hospital and released  was having some neck pain at that time  now states pain is in right foot  denies any injury  but unable to bear wt

## 2021-12-27 NOTE — ED Triage Notes (Signed)
Pt to triage via w/c with no distress noted; pt reports rt foot/ankle pain & swelling with no known injury

## 2021-12-27 NOTE — Discharge Instructions (Addendum)
Follow-up with Dr. Sharlet Salina if continued difficulty with your right lower extremity.  Your ultrasound did show you have a Baker's cyst which can cause pressure and pain to your lower extremity.  Lab work was reassuring.  The red area on your foot may be an early cellulitis.  Antibiotics are being sent to the pharmacy for this.  Follow-up with your primary care provider for the redness in your right foot if not improving after the antibiotic.

## 2021-12-27 NOTE — ED Provider Notes (Signed)
[facility] Provider Note    Event Date/Time   First MD Initiated Contact with Patient 12/27/21 0715     (approximate)   History   Foot Pain   HPI  Luke Rogers. is a 61 y.o. male presents to the ED with complaint of right lower extremity pain x3 days.  Pain and also complains of right foot and ankle pain with swelling without history of injury.  Patient states that he was recently hospitalized for alcohol withdrawal.  Patient states that he has taken over-the-counter medication and topical creams for the last 3 days without any relief.  Patient has a history of alcohol abuse with recent alcohol withdrawal, elevated LFTs, thrombocytopenia, hypomagnesia, hyponatremia and history of pancreatitis.  Patient is a 1 pack day smoker.     Physical Exam   Triage Vital Signs: ED Triage Vitals  Enc Vitals Group     BP 12/27/21 0244 119/81     Pulse Rate 12/27/21 0244 92     Resp 12/27/21 0244 20     Temp 12/27/21 0244 99.1 F (37.3 C)     Temp Source 12/27/21 0244 Oral     SpO2 12/27/21 0244 95 %     Weight 12/27/21 0246 178 lb (80.7 kg)     Height 12/27/21 0246 '6\' 2"'$  (1.88 m)     Head Circumference --      Peak Flow --      Pain Score 12/27/21 0245 10     Pain Loc --      Pain Edu? --      Excl. in [place]? --     Most recent vital signs: Vitals:   12/27/21 0244 12/27/21 0811  BP: 119/81 120/78  Pulse: 92 88  Resp: 20 18  Temp: 99.1 F (37.3 C)   SpO2: 95% 97%     General: Awake, no distress.  CV:  Good peripheral perfusion.  Resp:  Normal effort.  Abd:  No distention.  Other:  Examination of right lower extremity there is no gross deformity or gross soft tissue edema.  There is tenderness on palpation of the medial aspect of the patella and also dorsal aspect of the foot and bilateral malleolus.  No evidence of injury.  Range of motion increases pain.  Pulses are present.  Skin is intact.  There is some minimal warmth noted to the medial  aspect of the patella and medial malleolus.  The dorsal aspect of the right foot also is mildly erythematous without open skin or drainage.  Mild warmth appreciated.   ED Results / Procedures / Treatments   Labs (all labs ordered are listed, but only abnormal results are displayed) Labs Reviewed  BASIC METABOLIC PANEL - Abnormal; Notable for the following components:      Result Value   Glucose, Bld 108 (*)    All other components within normal limits  CBC WITH DIFFERENTIAL/PLATELET  URIC ACID      RADIOLOGY Ultrasound venous right lower extremity radiology report was reviewed by me and noted that no DVT present.  Patient does have a Baker's cyst measuring 3.6 cm x 1.5 cm.   Right tib-fib x-ray images were reviewed by myself independent of the radiologist and no acute bony abnormality is noted.  Radiology report is negative.  PROCEDURES:  Critical Care performed:   Procedures   MEDICATIONS ORDERED IN ED: Medications  ketorolac (TORADOL) 30 MG/ML injection 30 mg (30 mg Intramuscular Given 12/27/21 0751)  IMPRESSION / MDM / ASSESSMENT AND PLAN / ED COURSE  I reviewed the triage vital signs and the nursing notes.   Differential diagnosis includes, but is not limited to, right lower extremity pain, gout, DVT, muscle strain,  61 year old male presents to the ED with complaint of right lower extremity pain for the last 3 days.  Patient denies any injury.  Over-the-counter medication and topical creams have not helped.  Ultrasound was reassuring that there was no DVT involved and patient was made aware that he does have a Baker's cyst which can cause leg pain.  CBC was unremarkable with a white count of 8.8, BMP unremarkable and uric acid was 3.8.  Tib-fib x-ray was negative.  Patient was given Toradol 30 mg IM while in the ED which she states did not completely take care of his pain.  I discussed that he is being covered for a cellulitis that may be starting even though he is  asymptomatic.  He does have a low-grade temp of 99.1.  He is to follow-up with Ortho should he decide to have the Baker's cyst removed.  A prescription for Keflex 500 mg 4 times daily for the next 7 days was sent to the pharmacy and patient will use crutches and walker at home rather than walking with his cane.  He is to follow-up with his PCP if any continued problems or return to the emergency department if any severe worsening of his symptoms.    Patient's presentation is most consistent with acute complicated illness / injury requiring diagnostic workup.  FINAL CLINICAL IMPRESSION(S) / ED DIAGNOSES   Final diagnoses:  Baker's cyst of knee, right  Cellulitis of foot  Musculoskeletal pain of right lower extremity     Rx / DC Orders   ED Discharge Orders          Ordered    cephALEXin (KEFLEX) 500 MG capsule  4 times daily        12/27/21 0951             Note:  This document was prepared using Dragon voice recognition software and may include unintentional dictation errors.   Luke Hai, PA-C 12/27/21 1043    Vanessa Milroy, MD 12/28/21 1235

## 2021-12-31 ENCOUNTER — Emergency Department
Admission: EM | Admit: 2021-12-31 | Discharge: 2021-12-31 | Disposition: A | Discharge status: Home / Self Care | Payer: Self-pay | Attending: Emergency Medicine | Admitting: Emergency Medicine

## 2021-12-31 ENCOUNTER — Other Ambulatory Visit: Payer: Self-pay

## 2021-12-31 ENCOUNTER — Encounter: Payer: Self-pay | Admitting: Emergency Medicine

## 2021-12-31 ENCOUNTER — Emergency Department: Payer: Self-pay

## 2021-12-31 DIAGNOSIS — M436 Torticollis: Secondary | ICD-10-CM | POA: Insufficient documentation

## 2021-12-31 MED ORDER — MELOXICAM 15 MG PO TABS: 15.0000 mg | ORAL_TABLET | Freq: Every day | ORAL | 2 refills | Status: DC

## 2021-12-31 MED ORDER — BACLOFEN 10 MG PO TABS: 10.0000 mg | ORAL_TABLET | Freq: Three times a day (TID) | ORAL | 0 refills | Status: AC

## 2021-12-31 MED ORDER — CYCLOBENZAPRINE HCL 10 MG PO TABS
10.0000 mg | ORAL_TABLET | Freq: Once | ORAL | Status: AC
  Administered 2021-12-31: 10 mg via ORAL
  Filled 2021-12-31: qty 1

## 2021-12-31 NOTE — ED Notes (Signed)
Patient was taken to imaging.

## 2021-12-31 NOTE — ED Provider Notes (Signed)
[facility] Provider Note    Event Date/Time   First MD Initiated Contact with Patient 12/31/21 1104     (approximate)   History   Torticollis   HPI  Luke Rogers. is a 61 y.o. male with history of EtOH withdrawal, pancreatitis, cellulitis of the left foot, hyponatremia, hypomagnesia and amphetamine accidental overdose.  Presents to the emergency department with complaints of neck pain.  Patient states it hurts to bend and turn his neck.  No numbness or tingling.  No fever or chills.  Patient states he is taking the Keflex for the infection on his foot which is helping he can now bear weight on it.  Denies any history of IV drug use      Physical Exam   Triage Vital Signs: ED Triage Vitals  Enc Vitals Group     BP 12/31/21 1030 111/87     Pulse Rate 12/31/21 1030 80     Resp 12/31/21 1030 18     Temp 12/31/21 1030 98 F (36.7 C)     Temp src --      SpO2 12/31/21 1030 97 %     Weight 12/31/21 1029 178 lb (80.7 kg)     Height 12/31/21 1029 '6\' 2"'$  (1.88 m)     Head Circumference --      Peak Flow --      Pain Score 12/31/21 1029 10     Pain Loc --      Pain Edu? --      Excl. in [place]? --     Most recent vital signs: Vitals:   12/31/21 1030  BP: 111/87  Pulse: 80  Resp: 18  Temp: 98 F (36.7 C)  SpO2: 97%     General: Awake, no distress.   CV:  Good peripheral perfusion. regular rate and  rhythm Resp:  Normal effort.  Abd:  No distention.   Other:  C-spine is very tender to palpation, patient has pain reproduced with movement, tenderness along the musculature of the neck   ED Results / Procedures / Treatments   Labs (all labs ordered are listed, but only abnormal results are displayed) Labs Reviewed - No data to display   EKG     RADIOLOGY X-rays C-spine    PROCEDURES:   Procedures   MEDICATIONS ORDERED IN ED: Medications  cyclobenzaprine (FLEXERIL) tablet 10 mg (10 mg Oral Given 12/31/21 1117)      IMPRESSION / MDM / [place] / ED COURSE  I reviewed the triage vital signs and the nursing notes.                              Differential diagnosis includes, but is not limited to, torticollis, osteomyelitis, cervical radiculopath  Patient's presentation is most consistent with acute complicated illness / injury requiring diagnostic workup.   Due to the patient's past medical history we will do a C-spine to assess for osteomyelitis or injury.  We will give him a Flexeril for muscle spasms.  X-ray of the C-spine was interpreted by me as being negative for osteomyelitis or fracture.  Confirmed by radiology.  Radiologist comments degenerative disc disease.  I did discuss this with the patient.  He is to follow-up with orthopedics if not improving in 1 day.  Return emergency department worsening.  He is in agreement treatment plan.  Discharged stable condition.       FINAL  CLINICAL IMPRESSION(S) / ED DIAGNOSES   Final diagnoses:  Torticollis     Rx / DC Orders   ED Discharge Orders          Ordered    meloxicam (MOBIC) 15 MG tablet  Daily        12/31/21 1210    baclofen (LIORESAL) 10 MG tablet  3 times daily        12/31/21 1210             Note:  This document was prepared using Dragon voice recognition software and may include unintentional dictation errors.    Versie Starks, PA-C 12/31/21 1212    Carrie Mew, MD 01/01/22 [place]

## 2021-12-31 NOTE — ED Notes (Signed)
Pt with c/o neck pain x 2 days, pt ambulating to bathroom without assist, gait steady.

## 2021-12-31 NOTE — ED Notes (Signed)
Dc ppw provided to patient. Rx information and followup provided. Pr declines vs at  dc and provides verbal consent for dc at this time. Pt alert and oriented to lobby on foot

## 2021-12-31 NOTE — ED Triage Notes (Signed)
Pt via POV from home. Pt c/o neck stiffness for the past 2 days. States that it has been going on for 2 days but states that it worse today. No obvious swelling. Pt is A&Ox4 and NAD

## 2022-02-01 ENCOUNTER — Ambulatory Visit: Payer: Self-pay | Admitting: Family Medicine

## 2022-02-18 ENCOUNTER — Emergency Department: Payer: Self-pay

## 2022-02-18 ENCOUNTER — Other Ambulatory Visit: Payer: Self-pay

## 2022-02-18 ENCOUNTER — Encounter: Payer: Self-pay | Admitting: Emergency Medicine

## 2022-02-18 ENCOUNTER — Emergency Department
Admission: EM | Admit: 2022-02-18 | Discharge: 2022-02-18 | Disposition: A | Discharge status: Home / Self Care | Payer: Self-pay | Attending: Emergency Medicine | Admitting: Emergency Medicine

## 2022-02-18 DIAGNOSIS — F17200 Nicotine dependence, unspecified, uncomplicated: Secondary | ICD-10-CM | POA: Insufficient documentation

## 2022-02-18 DIAGNOSIS — R112 Nausea with vomiting, unspecified: Secondary | ICD-10-CM | POA: Insufficient documentation

## 2022-02-18 DIAGNOSIS — R1013 Epigastric pain: Secondary | ICD-10-CM | POA: Insufficient documentation

## 2022-02-18 DIAGNOSIS — R7401 Elevation of levels of liver transaminase levels: Secondary | ICD-10-CM | POA: Insufficient documentation

## 2022-02-18 DIAGNOSIS — K859 Acute pancreatitis without necrosis or infection, unspecified: Secondary | ICD-10-CM

## 2022-02-18 LAB — CBC WITH DIFFERENTIAL/PLATELET
Abs Immature Granulocytes: 0.01 10*3/uL (ref 0.00–0.07)
Basophils Absolute: 0.1 10*3/uL (ref 0.0–0.1)
Basophils Relative: 2 %
Eosinophils Absolute: 0 10*3/uL (ref 0.0–0.5)
Eosinophils Relative: 1 %
HCT: 38.5 % — ABNORMAL LOW (ref 39.0–52.0)
Hemoglobin: 13.5 g/dL (ref 13.0–17.0)
Immature Granulocytes: 0 %
Lymphocytes Relative: 27 %
Lymphs Abs: 1.4 10*3/uL (ref 0.7–4.0)
MCH: 32.5 pg (ref 26.0–34.0)
MCHC: 35.1 g/dL (ref 30.0–36.0)
MCV: 92.8 fL (ref 80.0–100.0)
Monocytes Absolute: 0.5 10*3/uL (ref 0.1–1.0)
Monocytes Relative: 11 %
Neutro Abs: 3 10*3/uL (ref 1.7–7.7)
Neutrophils Relative %: 59 %
Platelets: 91 10*3/uL — ABNORMAL LOW (ref 150–400)
RBC: 4.15 MIL/uL — ABNORMAL LOW (ref 4.22–5.81)
RDW: 14.8 % (ref 11.5–15.5)
WBC: 5 10*3/uL (ref 4.0–10.5)
nRBC: 0 % (ref 0.0–0.2)

## 2022-02-18 LAB — COMPREHENSIVE METABOLIC PANEL
ALT: 34 U/L (ref 0–44)
AST: 112 U/L — ABNORMAL HIGH (ref 15–41)
Albumin: 3.2 g/dL — ABNORMAL LOW (ref 3.5–5.0)
Alkaline Phosphatase: 92 U/L (ref 38–126)
Anion gap: 7 (ref 5–15)
BUN: 7 mg/dL (ref 6–20)
CO2: 26 mmol/L (ref 22–32)
Calcium: 8.8 mg/dL — ABNORMAL LOW (ref 8.9–10.3)
Chloride: 101 mmol/L (ref 98–111)
Creatinine, Ser: 0.71 mg/dL (ref 0.61–1.24)
GFR, Estimated: >60 mL/min (ref >60)
Glucose, Bld: 132 mg/dL — ABNORMAL HIGH (ref 70–99)
Potassium: 3.7 mmol/L (ref 3.5–5.1)
Sodium: 134 mmol/L — ABNORMAL LOW (ref 135–145)
Total Bilirubin: 1.2 mg/dL (ref 0.3–1.2)
Total Protein: 8 g/dL (ref 6.5–8.1)

## 2022-02-18 LAB — AMMONIA: Ammonia: 17 umol/L (ref 9–35)

## 2022-02-18 LAB — ETHANOL: Alcohol, Ethyl (B): 60 mg/dL — ABNORMAL HIGH (ref <10)

## 2022-02-18 LAB — LIPASE, BLOOD: Lipase: 99 U/L — ABNORMAL HIGH (ref 11–51)

## 2022-02-18 LAB — MAGNESIUM: Magnesium: 1.5 mg/dL — ABNORMAL LOW (ref 1.7–2.4)

## 2022-02-18 MED ORDER — MAGNESIUM SULFATE 2 GM/50ML IV SOLN
2.0000 g | Freq: Once | INTRAVENOUS | Status: AC
  Administered 2022-02-18: 2 g via INTRAVENOUS
  Filled 2022-02-18: qty 50

## 2022-02-18 MED ORDER — TRAMADOL HCL 50 MG PO TABS: 50.0000 mg | ORAL_TABLET | Freq: Four times a day (QID) | ORAL | 0 refills | Status: DC | PRN

## 2022-02-18 MED ORDER — LORAZEPAM 2 MG/ML IJ SOLN
1.0000 mg | INTRAMUSCULAR | Status: DC | PRN
  Administered 2022-02-18: 2 mg via INTRAVENOUS
  Filled 2022-02-18 (×2): qty 1

## 2022-02-18 MED ORDER — LACTATED RINGERS IV BOLUS
1000.0000 mL | Freq: Once | INTRAVENOUS | Status: AC
  Administered 2022-02-18: 1000 mL via INTRAVENOUS

## 2022-02-18 MED ORDER — IOHEXOL 300 MG/ML  SOLN
100.0000 mL | Freq: Once | INTRAMUSCULAR | Status: AC | PRN
  Administered 2022-02-18: 100 mL via INTRAVENOUS

## 2022-02-18 MED ORDER — THIAMINE HCL 100 MG PO TABS: 100.0000 mg | ORAL_TABLET | Freq: Every day | ORAL | Status: DC

## 2022-02-18 MED ORDER — HYDROMORPHONE HCL 1 MG/ML IJ SOLN
1.0000 mg | Freq: Once | INTRAMUSCULAR | Status: AC
  Administered 2022-02-18: 1 mg via INTRAVENOUS
  Filled 2022-02-18: qty 1

## 2022-02-18 MED ORDER — AZITHROMYCIN 250 MG PO TABS: ORAL_TABLET | ORAL | 0 refills | Status: AC

## 2022-02-18 MED ORDER — LORAZEPAM 1 MG PO TABS: 1.0000 mg | ORAL_TABLET | ORAL | Status: DC | PRN

## 2022-02-18 MED ORDER — FOLIC ACID 1 MG PO TABS
1.0000 mg | ORAL_TABLET | Freq: Every day | ORAL | Status: DC
  Filled 2022-02-18: qty 1

## 2022-02-18 MED ORDER — ADULT MULTIVITAMIN W/MINERALS CH
1.0000 | ORAL_TABLET | Freq: Every day | ORAL | Status: DC
  Administered 2022-02-18: 1 via ORAL
  Filled 2022-02-18: qty 1

## 2022-02-18 MED ORDER — ONDANSETRON HCL 4 MG PO TABS: 4.0000 mg | ORAL_TABLET | Freq: Three times a day (TID) | ORAL | 0 refills | Status: DC | PRN

## 2022-02-18 MED ORDER — THIAMINE HCL 100 MG/ML IJ SOLN
100.0000 mg | Freq: Every day | INTRAMUSCULAR | Status: DC
  Administered 2022-02-18: 100 mg via INTRAVENOUS
  Filled 2022-02-18: qty 2

## 2022-02-18 NOTE — ED Triage Notes (Signed)
Pt via POV from home. Pt c/o generalized abd pain, NV since this AM. Denies fever. Pt has a hx of pancreatitis and states this feels similar. Pt is A&Ox4 and NAD

## 2022-02-18 NOTE — ED Notes (Signed)
Signing pad is not working, pt and wife verbalized understanding of DC instructions.

## 2022-02-18 NOTE — ED Provider Notes (Signed)
[facility] Provider Note    Event Date/Time   First MD Initiated Contact with Patient 02/18/22 1246     (approximate)   History   Abdominal Pain   HPI  Luke Rogers. is a 61 y.o. male with past medical history of hep C, previous alcoholic related pancreatitis and ongoing alcohol abuse with patient stating he drinks approximately 240 ounces of beer per day as well as small amount today who presents for evaluation of some epigastric abdominal pain starting today feel similar to her pancreatitis attacks has had in the past.  Endorses some nausea and vomiting.  No diarrhea, constipation, urinary symptoms, chest pain, cough, shortness of breath, headache, earache, sore throat rash or extremity pain.  He denies any illicit drug use.  He endorsed tobacco abuse.  No other medications prior to arrival today.    Past Medical History:  Diagnosis Date   Alcohol abuse    Hepatitis C    Pancreatitis      Physical Exam  Triage Vital Signs: ED Triage Vitals [02/18/22 1245]  Enc Vitals Group     BP      Pulse      Resp      Temp      Temp src      SpO2      Weight 162 lb (73.5 kg)     Height 6' 2"  (1.88 m)     Head Circumference      Peak Flow      Pain Score 10     Pain Loc      Pain Edu?      Excl. in [place]?     Most recent vital signs: Vitals:   02/18/22 1400 02/18/22 1500  BP: 121/76 127/73  Pulse: 61 74  Resp: 16 14  Temp: 98.1 F (36.7 C) 98.1 F (36.7 C)  SpO2: 92% 96%    General: Awake, seems very uncomfortable. CV:  Long capillary refill in the digits.  2+ radial pulse.  No significant murmur. Resp:  Normal effort.  Clear Abd:  No distention.  Very tender in the epigastrium.  Soft in the lower quadrants. Other:  No significant CVA tenderness   ED Results / Procedures / Treatments  Labs (all labs ordered are listed, but only abnormal results are displayed) Labs Reviewed  CBC WITH DIFFERENTIAL/PLATELET - Abnormal; Notable for  the following components:      Result Value   RBC 4.15 (*)    HCT 38.5 (*)    Platelets 91 (*)    All other components within normal limits  COMPREHENSIVE METABOLIC PANEL - Abnormal; Notable for the following components:   Sodium 134 (*)    Glucose, Bld 132 (*)    Calcium 8.8 (*)    Albumin 3.2 (*)    AST 112 (*)    All other components within normal limits  LIPASE, BLOOD - Abnormal; Notable for the following components:   Lipase 99 (*)    All other components within normal limits  ETHANOL - Abnormal; Notable for the following components:   Alcohol, Ethyl (B) 60 (*)    All other components within normal limits  MAGNESIUM - Abnormal; Notable for the following components:   Magnesium 1.5 (*)    All other components within normal limits  AMMONIA  URINALYSIS, COMPLETE (UACMP) WITH MICROSCOPIC     EKG  EKG is remarkable for sinus rhythm with a ventricular rate of 58 with QTc interval of  501 with fair amount of artifact in several leads without other clear evidence of acute ischemia.   RADIOLOGY   PROCEDURES:  Critical Care performed: No  .1-3 Lead EKG Interpretation  Performed by: Lucrezia Starch, MD Authorized by: Lucrezia Starch, MD     Interpretation: normal     ECG rate assessment: normal     Rhythm: sinus rhythm     Ectopy: none     Conduction: normal     The patient is on the cardiac monitor to evaluate for evidence of arrhythmia and/or significant heart rate changes.   MEDICATIONS ORDERED IN ED: Medications  LORazepam (ATIVAN) tablet 1-4 mg ( Oral See Alternative 02/18/22 1352)    Or  LORazepam (ATIVAN) injection 1-4 mg (2 mg Intravenous Given 02/18/22 1352)  thiamine (VITAMIN B1) tablet 100 mg ( Oral See Alternative 02/18/22 1351)    Or  thiamine (VITAMIN B1) injection 100 mg (100 mg Intravenous Given 0/99/83 3825)  folic acid (FOLVITE) tablet 1 mg (1 mg Oral Not Given 02/18/22 1352)  multivitamin with minerals tablet 1 tablet (1 tablet Oral Given  02/18/22 1351)  lactated ringers bolus 1,000 mL (0 mLs Intravenous Stopped 02/18/22 1511)  HYDROmorphone (DILAUDID) injection 1 mg (1 mg Intravenous Given 02/18/22 1351)  magnesium sulfate IVPB 2 g 50 mL (0 g Intravenous Stopped 02/18/22 1511)     IMPRESSION / MDM / ASSESSMENT AND PLAN / ED COURSE  I reviewed the triage vital signs and the nursing notes. Patient's presentation is most consistent with acute presentation with potential threat to life or bodily function.                               Differential diagnosis includes, but is not limited to recurrent pancreatitis, alcoholic gastritis, cholecystitis, hepatitis, diverticulitis, cystitis.  EKG is remarkable for sinus rhythm with a ventricular rate of 58 with QTc interval of 501 with fair amount of artifact in several leads without other clear evidence of acute ischemia.  CBC without leukocytosis or acute anemia and platelets of 91.  CMP is remarkable for AST of 112 consistent patient's alcohol abuse without any other significant electrolyte or metabolic derangements.  Normal ALT, bilirubin and alk phos.  Patient elevated but not greater than 3 times upper limit of normal at 99.  This could be today due to chronicity of of his pancreatitis.  Serum ethanol 60.  Ammonia 17.  Magnesium 1.5.  Care patient signed over to assuming provider at approximately 1500 plan to follow-up CT and reassess.  In the meantime we will give some IV fluids analgesia and magnesium.  Patient placed on CIWA protocol.  Plan is for reassessment but low threshold for admission.      FINAL CLINICAL IMPRESSION(S) / ED DIAGNOSES   Final diagnoses:  Epigastric pain  Nausea and vomiting, unspecified vomiting type  Hypomagnesemia     Rx / DC Orders   ED Discharge Orders     None        Note:  This document was prepared using Dragon voice recognition software and may include unintentional dictation errors.   Lucrezia Starch, MD 02/18/22 [phone]

## 2022-02-18 NOTE — Discharge Instructions (Addendum)
Please seek medical attention for any high fevers, chest pain, shortness of breath, change in behavior, persistent vomiting, bloody stool or any other new or concerning symptoms.  

## 2022-06-03 ENCOUNTER — Emergency Department: Payer: 59

## 2022-06-03 ENCOUNTER — Other Ambulatory Visit: Payer: Self-pay

## 2022-06-03 ENCOUNTER — Encounter: Payer: Self-pay | Admitting: Emergency Medicine

## 2022-06-03 ENCOUNTER — Emergency Department
Admission: EM | Admit: 2022-06-03 | Discharge: 2022-06-03 | Disposition: A | Discharge status: Home / Self Care | Payer: 59 | Attending: Emergency Medicine | Admitting: Emergency Medicine

## 2022-06-03 DIAGNOSIS — M79605 Pain in left leg: Secondary | ICD-10-CM

## 2022-06-03 DIAGNOSIS — D2122 Benign neoplasm of connective and other soft tissue of left lower limb, including hip: Secondary | ICD-10-CM | POA: Insufficient documentation

## 2022-06-03 DIAGNOSIS — M7122 Synovial cyst of popliteal space [Baker], left knee: Secondary | ICD-10-CM | POA: Diagnosis not present

## 2022-06-03 DIAGNOSIS — M7989 Other specified soft tissue disorders: Secondary | ICD-10-CM | POA: Diagnosis not present

## 2022-06-03 DIAGNOSIS — M79662 Pain in left lower leg: Secondary | ICD-10-CM | POA: Diagnosis not present

## 2022-06-03 DIAGNOSIS — D219 Benign neoplasm of connective and other soft tissue, unspecified: Secondary | ICD-10-CM

## 2022-06-03 LAB — CBC WITH DIFFERENTIAL/PLATELET
Abs Immature Granulocytes: 0.01 10*3/uL (ref 0.00–0.07)
Basophils Absolute: 0.1 10*3/uL (ref 0.0–0.1)
Basophils Relative: 2 %
Eosinophils Absolute: 0.2 10*3/uL (ref 0.0–0.5)
Eosinophils Relative: 4 %
HCT: 39.8 % (ref 39.0–52.0)
Hemoglobin: 13.5 g/dL (ref 13.0–17.0)
Immature Granulocytes: 0 %
Lymphocytes Relative: 34 %
Lymphs Abs: 1.9 10*3/uL (ref 0.7–4.0)
MCH: 33 pg (ref 26.0–34.0)
MCHC: 33.9 g/dL (ref 30.0–36.0)
MCV: 97.3 fL (ref 80.0–100.0)
Monocytes Absolute: 0.6 10*3/uL (ref 0.1–1.0)
Monocytes Relative: 11 %
Neutro Abs: 2.8 10*3/uL (ref 1.7–7.7)
Neutrophils Relative %: 49 %
Platelets: 212 10*3/uL (ref 150–400)
RBC: 4.09 MIL/uL — ABNORMAL LOW (ref 4.22–5.81)
RDW: 13.6 % (ref 11.5–15.5)
WBC: 5.6 10*3/uL (ref 4.0–10.5)
nRBC: 0 % (ref 0.0–0.2)

## 2022-06-03 LAB — BASIC METABOLIC PANEL
Anion gap: 5 (ref 5–15)
BUN: 12 mg/dL (ref 6–20)
CO2: 30 mmol/L (ref 22–32)
Calcium: 8.9 mg/dL (ref 8.9–10.3)
Chloride: 105 mmol/L (ref 98–111)
Creatinine, Ser: 0.61 mg/dL (ref 0.61–1.24)
GFR, Estimated: >60 mL/min (ref >60)
Glucose, Bld: 118 mg/dL — ABNORMAL HIGH (ref 70–99)
Potassium: 3.8 mmol/L (ref 3.5–5.1)
Sodium: 140 mmol/L (ref 135–145)

## 2022-06-03 MED ORDER — GADOBUTROL 1 MMOL/ML IV SOLN
7.5000 mL | Freq: Once | INTRAVENOUS | Status: AC | PRN
  Administered 2022-06-03: 7.5 mL via INTRAVENOUS

## 2022-06-03 MED ORDER — MELOXICAM 15 MG PO TABS: 15.0000 mg | ORAL_TABLET | Freq: Every day | ORAL | 11 refills | Status: DC

## 2022-06-03 NOTE — Discharge Instructions (Addendum)
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  Please call the orthopedic oncologist listed in these instructions (Dr. Cherre Blanc) to schedule an appointment.  Let them know that you are seen here in the emergency department today.  -You may take acetaminophen and meloxicam as needed for pain.  -Return to the emergency department anytime if you begin to experience any new or worsening symptoms.

## 2022-06-03 NOTE — ED Triage Notes (Signed)
Pt reports for the past 3 weeks he has had pain to both of his legs from the knees down to his feet. Pt reports hx of the same and states one time it was a Baker's cyst and the other time it was cellulitis so he is not sure what is going on this time

## 2022-06-03 NOTE — ED Notes (Signed)
Blood was already drawn in triage. Called lab to have them run the bloodwork.

## 2022-06-03 NOTE — ED Notes (Signed)
Pt in ultrasound still.

## 2022-06-03 NOTE — ED Provider Notes (Signed)
[facility] Provider Note    Event Date/Time   First MD Initiated Contact with Patient 06/03/22 0913     (approximate)   History   Chief Complaint Leg Pain   HPI Luke Rogers. is a 61 y.o. male, history of alcohol use, amphetamine use, GERD, hypomagnesemia, presents to the emergency department for evaluation of pain in his lower extremities.  He states that for the past 3 weeks, he has had pain in his legs, predominantly in the calfs that radiated down to his feet.  He states that he was diagnosed with a Baker's cyst at one point, however he is not sure if this is still the source of his pain now.  He states that it hurts most to walk.  Last night, he states that his left lower extremity was very swollen below the knee, though this appears to have resolved on its own.  Denies any recent injuries or illnesses.  Denies fever/chills, chest pain, shortness of breath, abdominal pain, flank pain, nausea/vomiting, diarrhea, rash/lesions, cold sensation, or numbness/tingling in upper or lower extremities.  History Limitations: No limitations.        Physical Exam  Triage Vital Signs: ED Triage Vitals [06/03/22 0908]  Enc Vitals Group     BP      Pulse      Resp      Temp      Temp src      SpO2      Weight 170 lb (77.1 kg)     Height '6\' 2"'$  (1.88 m)     Head Circumference      Peak Flow      Pain Score 8     Pain Loc      Pain Edu?      Excl. in [place]?     Most recent vital signs: Vitals:   06/03/22 1311 06/03/22 1312  BP: 132/81   Pulse: 65 65  Resp: 16   Temp:    SpO2: 96% 96%    General: Awake, NAD.  Skin: Warm, dry. No rashes or lesions.  Eyes: PERRL. Conjunctivae normal.  CV: Good peripheral perfusion.  Resp: Normal effort.  Abd: Soft, non-tender. No distention.  Neuro: At baseline. No gross neurological deficits.  Musculoskeletal: Normal ROM of all extremities.  Focused Exam: No gross deformities to the lower extremities  bilaterally.  PMS intact distally in both lower extremities.  He does have some point tenderness along the medial aspect of the left gastrocnemius region.  No overlying erythema, warmth, or tenderness.  Physical Exam    ED Results / Procedures / Treatments  Labs (all labs ordered are listed, but only abnormal results are displayed) Labs Reviewed  BASIC METABOLIC PANEL - Abnormal; Notable for the following components:      Result Value   Glucose, Bld 118 (*)    All other components within normal limits  CBC WITH DIFFERENTIAL/PLATELET - Abnormal; Notable for the following components:   RBC 4.09 (*)    All other components within normal limits     EKG N/A.    RADIOLOGY  ED Provider Interpretation: I personally reviewed and interpreted these images.  Ultrasound does not show any evidence of DVT.  MRI does show a 12 cm mass along the medial head of the gastrocnemius.  MR TIBIA FIBULA LEFT W WO CONTRAST  Result Date: 06/03/2022 CLINICAL DATA:  Soft tissue mass of the leg. EXAM: MRI OF LOWER LEFT EXTREMITY WITHOUT AND WITH  CONTRAST TECHNIQUE: Multiplanar, multisequence MR imaging of the left leg was performed both before and after administration of intravenous contrast. CONTRAST:  7.18m GADAVIST GADOBUTROL 1 MMOL/ML IV SOLN COMPARISON:  None Available. FINDINGS: Bones/Joint/Cartilage Marrow signal is within normal limits. No evidence of fracture or osteonecrosis. Ligaments Syndesmotic ligament is intact. Muscles and Tendons There is a T1 low to isointense and T2 hyperintense soft tissue mass between the soleus and medial head of the gastrocnemius measuring approximately 2.7 x 2.1 x 12.0 cm. There is peripheral enhancement. The mass does not appear to invade the surrounding muscles and has smooth borders. Muscles of the anterior, posterior and peroneal compartment of the leg are within normal limits. No evidence of tendon tear. Soft tissues Mild subcutaneous soft tissue edema about medial  aspect of the proximal calf. No fluid collection or hematoma. IMPRESSION: 1. Peripherally enhancing soft tissue mass between the soleus and medial head of the medial head of the gastrocnemius measuring approximately 2.7 x 2.1 x 12.0 cm. 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 Differential includes myxoid liposarcoma or myxoid fibrosarcoma, or other soft tissue neoplasm. Orthopedic oncology consultation for further evaluation is recommended. 2.  No acute osseous abnormality. Electronically Signed   By: IKeane PoliceD.O.   On: 06/03/2022 15:20   UKoreaVenous Img Lower Bilateral  Result Date: 06/03/2022 CLINICAL DATA:  Pain, no recent injury EXAM: BILATERAL LOWER EXTREMITY VENOUS DOPPLER ULTRASOUND TECHNIQUE: Gray-scale sonography with graded compression, as well as color Doppler and duplex ultrasound were performed to evaluate the lower extremity deep venous systems from the level of the common femoral vein and including the common femoral, femoral, profunda femoral, popliteal and calf veins including the posterior tibial, peroneal and gastrocnemius veins when visible. The superficial great saphenous vein was also interrogated. Spectral Doppler was utilized to evaluate flow at rest and with distal augmentation maneuvers in the common femoral, femoral and popliteal veins. COMPARISON:  None Available. FINDINGS: RIGHT LOWER EXTREMITY Common Femoral Vein: No evidence of thrombus. Normal compressibility, respiratory phasicity and response to augmentation. Saphenofemoral Junction: No evidence of thrombus. Normal compressibility and flow on color Doppler imaging. Profunda Femoral Vein: No evidence of thrombus. Normal compressibility and flow on color Doppler imaging. Femoral Vein: No evidence of thrombus. Normal compressibility, respiratory phasicity and response to augmentation. Popliteal Vein: No evidence of thrombus. Normal compressibility, respiratory phasicity and response to augmentation. Calf  Veins: No evidence of thrombus. Normal compressibility and flow on color Doppler imaging. Superficial Great Saphenous Vein: No evidence of thrombus. Normal compressibility. Venous Reflux:  None. Other Findings:  Right Baker's cyst is noted. LEFT LOWER EXTREMITY Common Femoral Vein: No evidence of thrombus. Normal compressibility, respiratory phasicity and response to augmentation. Saphenofemoral Junction: No evidence of thrombus. Normal compressibility and flow on color Doppler imaging. Profunda Femoral Vein: No evidence of thrombus. Normal compressibility and flow on color Doppler imaging. Femoral Vein: No evidence of thrombus. Normal compressibility, respiratory phasicity and response to augmentation. Popliteal Vein: No evidence of thrombus. Normal compressibility, respiratory phasicity and response to augmentation. Calf Veins: No evidence of thrombus. Normal compressibility and flow on color Doppler imaging. Superficial Great Saphenous Vein: No evidence of thrombus. Normal compressibility. Venous Reflux:  None. Other Findings: Left Baker's cyst is noted. There is a 12.8 x 2.0 x 3.0 cm heterogeneous hypoechoic mass-like area visualized in the medial aspect of the left calf soft tissues. There is no internal blood flow on color Doppler examination. IMPRESSION: 1. No evidence of deep venous thrombosis in either lower  extremity. 2. There is a 12.8 cm heterogeneous mass-like area in the medial left calf soft tissues. Differential considerations include hematoma, abscess, and soft tissue malignancy. Recommend further assessment with left lower extremity MRI with and without contrast. 3. Bilateral Baker's cysts. Electronically Signed   By: Beryle Flock M.D.   On: 06/03/2022 12:16    PROCEDURES:  Critical Care performed: N/A.  Procedures    MEDICATIONS ORDERED IN ED: Medications  gadobutrol (GADAVIST) 1 MMOL/ML injection 7.5 mL (7.5 mLs Intravenous Contrast Given 06/03/22 1405)     IMPRESSION / MDM /  ASSESSMENT AND PLAN / ED COURSE  I reviewed the triage vital signs and the nursing notes.                              Differential diagnosis includes, but is not limited to, DVT, peripheral vascular disease, acute arterial occlusion, Baker's cyst, abscess, hematoma, soft tissue malignancy.  ED Course Patient appears well, vitals within normal limits.  NAD.  CBC shows no leukocytosis or anemia.  BMP shows no electrolyte abnormalities or AKI.  Assessment/Plan Patient presents with pain in the left lower extremity, particularly along the calf region, x3 weeks.  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  Spoke with our on-call orthopedic surgeon, Dr. Roland Rack, who provided me with contact information for orthopedic oncologist at [facility], Dr. Christean Grief.  Patient appears clinically stable at this time.  No serious or life-threatening pathology.  We will provide him with a prescription for meloxicam.  Advised him to contact Dr. Christean Grief tomorrow to schedule appointment.  Patient was amenable to this plan.  Will discharge.  Considered admission for this patient, but given his stable presentation and close follow-up, he is unlikely benefit from admission.  Provided the patient with anticipatory guidance, return precautions, and educational material. Encouraged the patient to return to the emergency department at any time if they begin to experience any new or worsening symptoms. Patient expressed understanding and agreed with the plan.   Patient's presentation is most consistent with acute complicated illness / injury requiring diagnostic workup.       FINAL CLINICAL IMPRESSION(S) / ED DIAGNOSES   Final diagnoses:  Pain of left lower extremity  Myxoma     Rx / DC Orders   ED Discharge Orders          Ordered    meloxicam (MOBIC) 15 MG tablet  Daily        06/03/22 1612             Note:  This  document was prepared using Dragon voice recognition software and may include unintentional dictation errors.   Teodoro Spray, [place] 06/03/22 1716    Carrie Mew, MD 06/05/22 [phone]

## 2022-06-03 NOTE — ED Notes (Signed)
See triage note. Pt states yesterday LLE was swollen and tight and that both lower legs are painful. Bruising noted to sock line on LLE. Both lower legs are not swollen at this time and non tender.

## 2022-07-17 ENCOUNTER — Inpatient Hospital Stay
Admission: EM | Admit: 2022-07-17 | Discharge: 2022-07-20 | DRG: 439 | Disposition: A | Discharge status: Home / Self Care | Payer: 59 | Attending: Internal Medicine | Admitting: Internal Medicine

## 2022-07-17 ENCOUNTER — Other Ambulatory Visit: Payer: Self-pay

## 2022-07-17 ENCOUNTER — Emergency Department: Payer: 59

## 2022-07-17 DIAGNOSIS — K863 Pseudocyst of pancreas: Secondary | ICD-10-CM | POA: Diagnosis not present

## 2022-07-17 DIAGNOSIS — K8689 Other specified diseases of pancreas: Secondary | ICD-10-CM | POA: Diagnosis not present

## 2022-07-17 DIAGNOSIS — Z720 Tobacco use: Secondary | ICD-10-CM

## 2022-07-17 DIAGNOSIS — N2889 Other specified disorders of kidney and ureter: Secondary | ICD-10-CM

## 2022-07-17 DIAGNOSIS — Z8661 Personal history of infections of the central nervous system: Secondary | ICD-10-CM

## 2022-07-17 DIAGNOSIS — K760 Fatty (change of) liver, not elsewhere classified: Secondary | ICD-10-CM | POA: Diagnosis not present

## 2022-07-17 DIAGNOSIS — K219 Gastro-esophageal reflux disease without esophagitis: Secondary | ICD-10-CM

## 2022-07-17 DIAGNOSIS — K852 Alcohol induced acute pancreatitis without necrosis or infection: Secondary | ICD-10-CM | POA: Diagnosis not present

## 2022-07-17 DIAGNOSIS — I10 Essential (primary) hypertension: Secondary | ICD-10-CM | POA: Diagnosis not present

## 2022-07-17 DIAGNOSIS — K859 Acute pancreatitis without necrosis or infection, unspecified: Secondary | ICD-10-CM

## 2022-07-17 DIAGNOSIS — Z79899 Other long term (current) drug therapy: Secondary | ICD-10-CM | POA: Diagnosis not present

## 2022-07-17 DIAGNOSIS — Z743 Need for continuous supervision: Secondary | ICD-10-CM | POA: Diagnosis not present

## 2022-07-17 DIAGNOSIS — Z8249 Family history of ischemic heart disease and other diseases of the circulatory system: Secondary | ICD-10-CM | POA: Diagnosis not present

## 2022-07-17 DIAGNOSIS — N3289 Other specified disorders of bladder: Secondary | ICD-10-CM | POA: Diagnosis not present

## 2022-07-17 DIAGNOSIS — F17210 Nicotine dependence, cigarettes, uncomplicated: Secondary | ICD-10-CM | POA: Diagnosis not present

## 2022-07-17 DIAGNOSIS — F1010 Alcohol abuse, uncomplicated: Secondary | ICD-10-CM

## 2022-07-17 DIAGNOSIS — Z791 Long term (current) use of non-steroidal anti-inflammatories (NSAID): Secondary | ICD-10-CM

## 2022-07-17 DIAGNOSIS — R1013 Epigastric pain: Secondary | ICD-10-CM | POA: Diagnosis not present

## 2022-07-17 DIAGNOSIS — R69 Illness, unspecified: Secondary | ICD-10-CM | POA: Diagnosis not present

## 2022-07-17 DIAGNOSIS — R1084 Generalized abdominal pain: Secondary | ICD-10-CM | POA: Diagnosis not present

## 2022-07-17 DIAGNOSIS — R001 Bradycardia, unspecified: Secondary | ICD-10-CM | POA: Diagnosis not present

## 2022-07-17 LAB — URINALYSIS, ROUTINE W REFLEX MICROSCOPIC
Bacteria, UA: NONE SEEN
Bilirubin Urine: NEGATIVE
Glucose, UA: NEGATIVE mg/dL
Hgb urine dipstick: NEGATIVE
Ketones, ur: NEGATIVE mg/dL
Nitrite: NEGATIVE
Protein, ur: NEGATIVE mg/dL
Specific Gravity, Urine: 1.028 (ref 1.005–1.030)
Squamous Epithelial / HPF: NONE SEEN (ref 0–5)
pH: 5 (ref 5.0–8.0)

## 2022-07-17 LAB — CBC WITH DIFFERENTIAL/PLATELET
Abs Immature Granulocytes: 0.04 10*3/uL (ref 0.00–0.07)
Basophils Absolute: 0.1 10*3/uL (ref 0.0–0.1)
Basophils Relative: 1 %
Eosinophils Absolute: 0.2 10*3/uL (ref 0.0–0.5)
Eosinophils Relative: 2 %
HCT: 46.8 % (ref 39.0–52.0)
Hemoglobin: 15.9 g/dL (ref 13.0–17.0)
Immature Granulocytes: 0 %
Lymphocytes Relative: 19 %
Lymphs Abs: 2 10*3/uL (ref 0.7–4.0)
MCH: 33.1 pg (ref 26.0–34.0)
MCHC: 34 g/dL (ref 30.0–36.0)
MCV: 97.5 fL (ref 80.0–100.0)
Monocytes Absolute: 1.2 10*3/uL — ABNORMAL HIGH (ref 0.1–1.0)
Monocytes Relative: 12 %
Neutro Abs: 6.6 10*3/uL (ref 1.7–7.7)
Neutrophils Relative %: 66 %
Platelets: 145 10*3/uL — ABNORMAL LOW (ref 150–400)
RBC: 4.8 MIL/uL (ref 4.22–5.81)
RDW: 13 % (ref 11.5–15.5)
WBC: 10.1 10*3/uL (ref 4.0–10.5)
nRBC: 0 % (ref 0.0–0.2)

## 2022-07-17 LAB — COMPREHENSIVE METABOLIC PANEL
ALT: 15 U/L (ref 0–44)
AST: 38 U/L (ref 15–41)
Albumin: 3.4 g/dL — ABNORMAL LOW (ref 3.5–5.0)
Alkaline Phosphatase: 77 U/L (ref 38–126)
Anion gap: 6 (ref 5–15)
BUN: 16 mg/dL (ref 6–20)
CO2: 26 mmol/L (ref 22–32)
Calcium: 8.7 mg/dL — ABNORMAL LOW (ref 8.9–10.3)
Chloride: 101 mmol/L (ref 98–111)
Creatinine, Ser: 0.72 mg/dL (ref 0.61–1.24)
GFR, Estimated: >60 mL/min (ref >60)
Glucose, Bld: 149 mg/dL — ABNORMAL HIGH (ref 70–99)
Potassium: 4.2 mmol/L (ref 3.5–5.1)
Sodium: 133 mmol/L — ABNORMAL LOW (ref 135–145)
Total Bilirubin: 2.5 mg/dL — ABNORMAL HIGH (ref 0.3–1.2)
Total Protein: 9 g/dL — ABNORMAL HIGH (ref 6.5–8.1)

## 2022-07-17 LAB — LIPASE, BLOOD: Lipase: 1458 U/L — ABNORMAL HIGH (ref 11–51)

## 2022-07-17 LAB — TROPONIN I (HIGH SENSITIVITY)
Troponin I (High Sensitivity): <2 ng/L (ref <18)
Troponin I (High Sensitivity): 3 ng/L (ref <18)

## 2022-07-17 LAB — TRIGLYCERIDES: Triglycerides: 40 mg/dL (ref <150)

## 2022-07-17 MED ORDER — ONDANSETRON HCL 4 MG/2ML IJ SOLN: 4.0000 mg | Freq: Three times a day (TID) | INTRAMUSCULAR | Status: DC | PRN

## 2022-07-17 MED ORDER — LORAZEPAM 2 MG/ML IJ SOLN: 1.0000 mg | INTRAMUSCULAR | Status: AC | PRN

## 2022-07-17 MED ORDER — ADULT MULTIVITAMIN W/MINERALS CH
1.0000 | ORAL_TABLET | Freq: Every day | ORAL | Status: DC
  Administered 2022-07-17 – 2022-07-20 (×4): 1 via ORAL
  Filled 2022-07-17 (×4): qty 1

## 2022-07-17 MED ORDER — HYDROMORPHONE HCL 1 MG/ML IJ SOLN
1.0000 mg | INTRAMUSCULAR | Status: DC | PRN
  Administered 2022-07-18 (×2): 1 mg via INTRAVENOUS
  Filled 2022-07-17 (×2): qty 1

## 2022-07-17 MED ORDER — LORAZEPAM 1 MG PO TABS: 1.0000 mg | ORAL_TABLET | ORAL | Status: AC | PRN

## 2022-07-17 MED ORDER — FOLIC ACID 1 MG PO TABS
1.0000 mg | ORAL_TABLET | Freq: Every day | ORAL | Status: DC
  Administered 2022-07-17 – 2022-07-20 (×4): 1 mg via ORAL
  Filled 2022-07-17 (×4): qty 1

## 2022-07-17 MED ORDER — IOHEXOL 350 MG/ML SOLN
80.0000 mL | Freq: Once | INTRAVENOUS | Status: AC | PRN
  Administered 2022-07-17: 80 mL via INTRAVENOUS

## 2022-07-17 MED ORDER — LORAZEPAM 2 MG/ML IJ SOLN: 0.0000 mg | Freq: Two times a day (BID) | INTRAMUSCULAR | Status: DC

## 2022-07-17 MED ORDER — ACETAMINOPHEN 325 MG PO TABS: 650.0000 mg | ORAL_TABLET | Freq: Four times a day (QID) | ORAL | Status: DC | PRN

## 2022-07-17 MED ORDER — THIAMINE MONONITRATE 100 MG PO TABS
100.0000 mg | ORAL_TABLET | Freq: Every day | ORAL | Status: DC
  Administered 2022-07-17 – 2022-07-20 (×4): 100 mg via ORAL
  Filled 2022-07-17 (×4): qty 1

## 2022-07-17 MED ORDER — LORAZEPAM 2 MG/ML IJ SOLN
0.0000 mg | Freq: Four times a day (QID) | INTRAMUSCULAR | Status: AC
  Administered 2022-07-18: 2 mg via INTRAVENOUS
  Filled 2022-07-17: qty 1

## 2022-07-17 MED ORDER — LACTATED RINGERS IV BOLUS
1000.0000 mL | Freq: Once | INTRAVENOUS | Status: AC
  Administered 2022-07-17: 1000 mL via INTRAVENOUS

## 2022-07-17 MED ORDER — PANTOPRAZOLE SODIUM 40 MG PO TBEC
40.0000 mg | DELAYED_RELEASE_TABLET | Freq: Every day | ORAL | Status: DC
  Administered 2022-07-17 – 2022-07-19 (×3): 40 mg via ORAL
  Filled 2022-07-17 (×3): qty 1

## 2022-07-17 MED ORDER — MORPHINE SULFATE (PF) 4 MG/ML IV SOLN
4.0000 mg | INTRAVENOUS | Status: DC | PRN
  Administered 2022-07-17: 4 mg via INTRAVENOUS
  Filled 2022-07-17: qty 1

## 2022-07-17 MED ORDER — ENOXAPARIN SODIUM 40 MG/0.4ML IJ SOSY
40.0000 mg | PREFILLED_SYRINGE | INTRAMUSCULAR | Status: DC
  Administered 2022-07-17 – 2022-07-19 (×3): 40 mg via SUBCUTANEOUS
  Filled 2022-07-17 (×3): qty 0.4

## 2022-07-17 MED ORDER — ONDANSETRON HCL 4 MG/2ML IJ SOLN
4.0000 mg | Freq: Once | INTRAMUSCULAR | Status: AC
  Administered 2022-07-17: 4 mg via INTRAVENOUS
  Filled 2022-07-17: qty 2

## 2022-07-17 MED ORDER — SODIUM CHLORIDE 0.9 % IV SOLN: INTRAVENOUS | Status: DC

## 2022-07-17 MED ORDER — OXYCODONE HCL 5 MG PO TABS
5.0000 mg | ORAL_TABLET | Freq: Four times a day (QID) | ORAL | Status: DC | PRN
  Administered 2022-07-17: 5 mg via ORAL
  Filled 2022-07-17: qty 1

## 2022-07-17 MED ORDER — NICOTINE 21 MG/24HR TD PT24
21.0000 mg | MEDICATED_PATCH | Freq: Every day | TRANSDERMAL | Status: DC
  Filled 2022-07-17 (×4): qty 1

## 2022-07-17 MED ORDER — THIAMINE HCL 100 MG/ML IJ SOLN
100.0000 mg | Freq: Every day | INTRAMUSCULAR | Status: DC
  Filled 2022-07-17 (×2): qty 2

## 2022-07-17 NOTE — ED Triage Notes (Signed)
Pain ongoing x 2 days. Denies N/V. Denies Urinary Sx

## 2022-07-17 NOTE — ED Triage Notes (Signed)
First nurse note- To triage via [facility] from home with c/o left flank pain. Hx of pancreatitis.  20G Right Hand

## 2022-07-17 NOTE — H&P (Signed)
History and Physical    [name]. LGX:[id number] DOB: 02/04/1961 DOA: 07/17/2022  Referring MD/NP/PA:   PCP: Pcp, No   Patient coming from:  The patient is coming from home.        Chief Complaint: abdominal pain  HPI: Luke Rogers. is a 60 y.o. male with medical history significant of alcohol abuse, alcoholic hepatitis, hepatitis C, tobacco abuse, meningitis, GERD, who presents with abdominal pain.  Patient said that he has abdominal pain for more than 2 days, which is located in the left upper quadrant, constant, sharp, severe, radiating to the left shoulder.  Associated with nausea, dry heaves, no diarrhea.  No fever or chills.  Patient has mild dry cough, mild shortness breath, denies chest pain.  No symptoms of UTI.  Data reviewed independently and ED Course: pt was found to have lipase 1458, triglyceride level 40, WBC 10.1, urinalysis (clear appearance, trace amount of leukocyte, negative leukocyte, WBC 6-10), troponin level negative x 2.  Patient is admitted to [facility] bed as inpatient.  CT of abdomen/pelvis: 1. There is a 4.4 cm fluid density mass within the head of the pancreas which previously reflected to separate fluid density masses. There is mild pancreatic ductal dilation downstream to this mass as well as scattered possible intraductal calcifications. There is mild fat stranding adjacent to the pancreas. Findings are favored to reflect acute on chronic pancreatitis with pseudocyst formation. Recommend correlation with lipase levels. 2. There is a newly conspicuous 7 mm fluid density mass within the pancreatic tail. This could reflect a pseudocyst or side branch IPMN. Recommend further evaluation with nonemergent abdominal MRI with and without contrast with MRCP. 3. Hepatic steatosis with suggestion of a mildly nodular contour of the liver. This could reflect early cirrhosis. 4. Moderate volume free fluid in the pelvis as well as tracking along  the liver, pancreas and spleen. 5. Large hiatal hernia. 6. Similar appearance of mildly enlarged perigastric and peripancreatic lymph nodes. These are likely reactive. 7. There is a high density mass within the superior pole of the LEFT kidney which is not significantly changed dating back to 2022. This could reflect a hemorrhagic cyst. Recommend attention on follow-up abdominal MRI.  EKG: I have personally reviewed.     Review of Systems:   General: no fevers, chills, no body weight gain, has poor appetite, has fatigue HEENT: no blurry vision, hearing changes or sore throat Respiratory: has dyspnea, coughing, no wheezing CV: no chest pain, no palpitations GI: has nausea, dry heaves, abdominal pain, no diarrhea, constipation GU: no dysuria, burning on urination, increased urinary frequency, hematuria  Ext: no leg edema Neuro: no unilateral weakness, numbness, or tingling, no vision change or hearing loss Skin: no rash, no skin tear. MSK: No muscle spasm, no deformity, no limitation of range of movement in spin Heme: No easy bruising.  Travel history: No recent long distant travel.   Allergy: No Known Allergies  Past Medical History:  Diagnosis Date   Alcohol abuse    Hepatitis C    Pancreatitis     Past Surgical History:  Procedure Laterality Date   FOOT SURGERY Bilateral    As infant   WRIST SURGERY Right     Social History:  reports that he has been smoking cigarettes. He has been smoking an average of 1 pack per day. He has never used smokeless tobacco. He reports current alcohol use. He reports that he does not use drugs.  Family History:  Family History  Problem Relation Age of Onset   Hypertension Mother      Prior to Admission medications   Medication Sig Start Date End Date Taking? Authorizing Provider  acetaminophen (TYLENOL) 325 MG tablet Take 2 tablets (650 mg total) by mouth every 6 (six) hours as needed for mild pain (or Fever >/= 101). 12/23/21    Mercy Riding, MD  folic acid (FOLVITE) 1 MG tablet Take 1 tablet (1 mg total) by mouth daily. Patient not taking: Reported on 06/03/2022 12/24/21   Mercy Riding, MD  lactulose (CHRONULAC) 10 GM/15ML solution Take 20 g (30 cc) 1-3 times a day for a goal of 1-2 bowel movements a day Patient not taking: Reported on 06/03/2022 12/23/21   Mercy Riding, MD  meloxicam (MOBIC) 15 MG tablet Take 1 tablet (15 mg total) by mouth daily. 06/03/22 06/03/23  Teodoro Spray, PA  Multiple Vitamin (MULTIVITAMIN WITH MINERALS) TABS tablet Take 1 tablet by mouth daily. Patient not taking: Reported on 06/03/2022 12/24/21   Mercy Riding, MD  ondansetron (ZOFRAN) 4 MG tablet Take 1 tablet (4 mg total) by mouth every 8 (eight) hours as needed. Patient not taking: Reported on 06/03/2022 02/18/22   Nance Pear, MD  pantoprazole (PROTONIX) 40 MG tablet Take 1 tablet (40 mg total) by mouth daily. Patient not taking: Reported on 06/03/2022 12/24/21   Mercy Riding, MD  thiamine 100 MG tablet Take 1 tablet (100 mg total) by mouth daily. Patient not taking: Reported on 06/03/2022 12/24/21   Mercy Riding, MD  traMADol (ULTRAM) 50 MG tablet Take 1 tablet (50 mg total) by mouth every 6 (six) hours as needed for severe pain. Patient not taking: Reported on 06/03/2022 02/18/22 02/18/23  Nance Pear, MD    Physical Exam: Vitals:   07/17/22 0636 07/17/22 0849 07/17/22 1245 07/17/22 1251  BP:  122/82 (!) 137/93 (!) 137/93  Pulse:  82 78 78  Resp:  (!) 22  18  Temp:  98.1 F (36.7 C)  99.1 F (37.3 C)  TempSrc:  Oral  Oral  SpO2:  99%  98%  Weight: 77.1 kg     Height: '6\' 2"'$  (1.88 m)      General: Not in acute distress HEENT:       Eyes: PERRL, EOMI, no scleral icterus.       ENT: No discharge from the ears and nose, no pharynx injection, no tonsillar enlargement.        Neck: No JVD, no bruit, no mass felt. Heme: No neck lymph node enlargement. Cardiac: S1/S2, RRR, No murmurs, No gallops or rubs. Respiratory:  No rales, wheezing, rhonchi or rubs. GI: Soft, nondistended, has tenderness in left upper quadrant, no rebound pain, no organomegaly, BS present. GU: No hematuria Ext: No pitting leg edema bilaterally. 1+DP/PT pulse bilaterally. Musculoskeletal: No joint deformities, No joint redness or warmth, no limitation of ROM in spin. Skin: No rashes.  Neuro: Alert, oriented X3, cranial nerves II-XII grossly intact, moves all extremities normally.  Psych: Patient is not psychotic, no suicidal or hemocidal ideation.  Labs on Admission: I have personally reviewed following labs and imaging studies  CBC: Recent Labs  Lab 07/17/22 0643  WBC 10.1  NEUTROABS 6.6  HGB 15.9  HCT 46.8  MCV 97.5  PLT 008*   Basic Metabolic Panel: Recent Labs  Lab 07/17/22 0643  NA 133*  K 4.2  CL 101  CO2 26  GLUCOSE 149*  BUN 16  CREATININE 0.72  CALCIUM  8.7*   GFR: Estimated Creatinine Clearance: 108.4 mL/min (by C-G formula based on SCr of 0.72 mg/dL). Liver Function Tests: Recent Labs  Lab 07/17/22 0643  AST 38  ALT 15  ALKPHOS 77  BILITOT 2.5*  PROT 9.0*  ALBUMIN 3.4*   Recent Labs  Lab 07/17/22 0643  LIPASE 1,458*   No results for input(s): "AMMONIA" in the last 168 hours. Coagulation Profile: No results for input(s): "INR", "PROTIME" in the last 168 hours. Cardiac Enzymes: No results for input(s): "CKTOTAL", "CKMB", "CKMBINDEX", "TROPONINI" in the last 168 hours. BNP (last 3 results) No results for input(s): "PROBNP" in the last 8760 hours. HbA1C: No results for input(s): "HGBA1C" in the last 72 hours. CBG: No results for input(s): "GLUCAP" in the last 168 hours. Lipid Profile: Recent Labs    07/17/22 0852  TRIG 40   Thyroid Function Tests: No results for input(s): "TSH", "T4TOTAL", "FREET4", "T3FREE", "THYROIDAB" in the last 72 hours. Anemia Panel: No results for input(s): "VITAMINB12", "FOLATE", "FERRITIN", "TIBC", "IRON", "RETICCTPCT" in the last 72 hours. Urine  analysis:    Component Value Date/Time   COLORURINE AMBER (A) 07/17/2022 0853   APPEARANCEUR CLEAR (A) 07/17/2022 0853   LABSPEC 1.028 07/17/2022 0853   PHURINE 5.0 07/17/2022 0853   GLUCOSEU NEGATIVE 07/17/2022 0853   HGBUR NEGATIVE 07/17/2022 0853   BILIRUBINUR NEGATIVE 07/17/2022 0853   KETONESUR NEGATIVE 07/17/2022 0853   PROTEINUR NEGATIVE 07/17/2022 0853   NITRITE NEGATIVE 07/17/2022 0853   LEUKOCYTESUR TRACE (A) 07/17/2022 0853   Sepsis Labs: '@LABRCNTIP'$ (procalcitonin:4,lacticidven:4) )No results found for this or any previous visit (from the past 240 hour(s)).   Radiological Exams on Admission: CT Abdomen Pelvis W Contrast  Result Date: 07/17/2022 CLINICAL DATA:  Pancreatitis, acute, severe EXAM: CT ABDOMEN AND PELVIS WITH CONTRAST TECHNIQUE: Multidetector CT imaging of the abdomen and pelvis was performed using the standard protocol following bolus administration of intravenous contrast. RADIATION DOSE REDUCTION: This exam was performed according to the departmental dose-optimization program which includes automated exposure control, adjustment of the mA and/or kV according to patient size and/or use of iterative reconstruction technique. CONTRAST:  72m OMNIPAQUE IOHEXOL 350 MG/ML SOLN COMPARISON:  February 18, 2022. January 17, 2021 FINDINGS: Lower chest: No acute abnormality. Hepatobiliary: Hepatic steatosis. Query a mildly nodular contour of the liver. Gallbladder is mildly distended without definitive ancillary evidence of acute cholecystitis. Pancreas: There is a fluid density mass within the head of the pancreas which measures d 4.4 by 3.6 cm by 3.7 cm (series 2, image 31; series 5, image 31. This previously was 2 separate fluid density areas which spanned approximately 3.1 by 1.6 by 1.4 cm. There is mild pancreatic ductal dilation downstream to this mass measuring up to 5 mm. Revisualization of a punctate radiopaque focus within the pancreatic duct of the uncinate process. There is  mild fat stranding adjacent to the pancreas. There is a newly conspicuous fluid density mass within the pancreatic tail which measures 7 mm (series 2, image 22). Otherwise, the pancreas enhances relatively homogeneously. There is an additional coarse calcification within the pancreatic tail likely reflecting sequela of prior pancreatitis. No evidence of pseudoaneurysm formation. Spleen: Upper limits of normal in size.  Multiple splenules. Adrenals/Urinary Tract: Adrenal glands are unremarkable. There is a high density mass within the superior pole of the LEFT kidney which measures 20 mm which is not significantly changed dating back to 2022. There is an exophytic benign cyst of the superior pole of the RIGHT kidney. Multiple additional subcentimeter hypodense masses  are too small to accurately characterize. No hydronephrosis. No obstructing nephrolithiasis. Bladder is completely decompressed. Stomach/Bowel: No evidence of bowel obstruction. Appendix is normal. Large hiatal hernia. Vascular/Lymphatic: Abdominal aorta is normal in course and caliber. Mild atherosclerotic calcifications. There are mildly enlarged perigastric and peripancreatic lymph nodes with representative perigastric lymph node measuring 12 mm in the short axis (series 2, image 19). This is similar in comparison to prior. Reproductive: Prostate is present. Other: Small volume free fluid tracking adjacent to the liver, pancreas and spleen. Moderate volume free fluid in the pelvis. Musculoskeletal: No acute osseous abnormality. IMPRESSION: 1. There is a 4.4 cm fluid density mass within the head of the pancreas which previously reflected to separate fluid density masses. There is mild pancreatic ductal dilation downstream to this mass as well as scattered possible intraductal calcifications. There is mild fat stranding adjacent to the pancreas. Findings are favored to reflect acute on chronic pancreatitis with pseudocyst formation. Recommend  correlation with lipase levels. 2. There is a newly conspicuous 7 mm fluid density mass within the pancreatic tail. This could reflect a pseudocyst or side branch IPMN. Recommend further evaluation with nonemergent abdominal MRI with and without contrast with MRCP. 3. Hepatic steatosis with suggestion of a mildly nodular contour of the liver. This could reflect early cirrhosis. 4. Moderate volume free fluid in the pelvis as well as tracking along the liver, pancreas and spleen. 5. Large hiatal hernia. 6. Similar appearance of mildly enlarged perigastric and peripancreatic lymph nodes. These are likely reactive. 7. There is a high density mass within the superior pole of the LEFT kidney which is not significantly changed dating back to 2022. This could reflect a hemorrhagic cyst. Recommend attention on follow-up abdominal MRI. Electronically Signed   By: Valentino Saxon M.D.   On: 07/17/2022 09:58      Assessment/Plan Principal Problem:   Recurrent acute pancreatitis Active Problems:   Alcohol abuse   Tobacco abuse   GERD (gastroesophageal reflux disease)   Pancreatic mass   Left renal mass   Assessment and Plan:   Recurrent acute pancreatitis: lipase  1458. CT scan showed a possible 4.4 cm in head of pancreas.  -will admit to med-surg bed as inpt -NPO for pancreatitis -IVF: 1L LR and then at 125 cc/hr -prn IV morphine and oxycodone for pain control -prn IV zofran for nausea -check triglyceride level --> 40  Alcohol abuse and Tobacco abuse -did counseling about importance of quitting alcohol and tobacco -CIWA protocol -Nicotine patch  GERD (gastroesophageal reflux disease) -Protonix  Pancreatic mass: CT showed  a newly conspicuous 7 mm fluid density mass within the pancreatic tail. This could reflect a pseudocyst or side branch IPMN.  -will need f/u with nonemergent abdominal MRI or with and without contrast with MRCP.  Left renal mass: -f/u with PCP as out pt work up and  follow up    DVT ppx:  SQ Lovenox  Code Status: Full code  Family Communication: I offered to call his family, but patient states that I do need to call his family, he will call by himself.  Disposition Plan:  Anticipate discharge back to previous environment  Consults called: None  Admission status and Level of care: Med-Surg:     as inpt       Dispo: The patient is from: Home              Anticipated d/c is to: Home  Anticipated d/c date is: 2 days              Patient currently is not medically stable to d/c.    Severity of Illness:  The appropriate patient status for this patient is INPATIENT. Inpatient status is judged to be reasonable and necessary in order to provide the required intensity of service to ensure the patient's safety. The patient's presenting symptoms, physical exam findings, and initial radiographic and laboratory data in the context of their chronic comorbidities is felt to place them at high risk for further clinical deterioration. Furthermore, it is not anticipated that the patient will be medically stable for discharge from the hospital within 2 midnights of admission.   * I certify that at the point of admission it is my clinical judgment that the patient will require inpatient hospital care spanning beyond 2 midnights from the point of admission due to high intensity of service, high risk for further deterioration and high frequency of surveillance required.*       Date of Service 07/17/2022    Ivor Costa [facility]   If 7PM-7AM, please contact night-coverage [email or web address] 07/17/2022, 12:57 PM

## 2022-07-17 NOTE — ED Provider Notes (Signed)
[facility] Provider Note    Event Date/Time   First MD Initiated Contact with Patient 07/17/22 1125     (approximate)   History   Flank Pain   HPI  Luke Rogers. is a 61 y.o. male history of alcoholic pancreatitis still drinking roughly 2 beers nightly presents to the ER for evaluation of 2 days of worsening epigastric pain radiating to his flank.  Having some cough congestion.  No measured fevers.  Poor p.o. intake secondary to pain.  No diarrhea.  Not on any blood thinners.  Nuys any trauma.     Physical Exam   Triage Vital Signs: ED Triage Vitals  Enc Vitals Group     BP 07/17/22 0849 122/82     Pulse Rate 07/17/22 0849 82     Resp 07/17/22 0849 (!) 22     Temp 07/17/22 0849 98.1 F (36.7 C)     Temp Source 07/17/22 0849 Oral     SpO2 07/17/22 0849 99 %     Weight 07/17/22 0636 170 lb (77.1 kg)     Height 07/17/22 0636 '6\' 2"'$  (1.88 m)     Head Circumference --      Peak Flow --      Pain Score 07/17/22 0636 10     Pain Loc --      Pain Edu? --      Excl. in [place]? --     Most recent vital signs: Vitals:   07/17/22 0849  BP: 122/82  Pulse: 82  Resp: (!) 22  Temp: 98.1 F (36.7 C)  SpO2: 99%     Constitutional: Alert  Eyes: Conjunctivae are normal.  Head: Atraumatic. Nose: No congestion/rhinnorhea. Mouth/Throat: Mucous membranes are dry   Neck: Painless ROM.  Cardiovascular:   Good peripheral circulation. Respiratory: Normal respiratory effort.  No retractions.  Gastrointestinal: Soft mild epigastric tenderness palpation. Musculoskeletal:  no deformity Neurologic:  MAE spontaneously. No gross focal neurologic deficits are appreciated.  Skin:  Skin is warm, dry and intact. No rash noted. Psychiatric: Mood and affect are normal. Speech and behavior are normal.    ED Results / Procedures / Treatments   Labs (all labs ordered are listed, but only abnormal results are displayed) Labs Reviewed  CBC WITH  DIFFERENTIAL/PLATELET - Abnormal; Notable for the following components:      Result Value   Platelets 145 (*)    Monocytes Absolute 1.2 (*)    All other components within normal limits  COMPREHENSIVE METABOLIC PANEL - Abnormal; Notable for the following components:   Sodium 133 (*)    Glucose, Bld 149 (*)    Calcium 8.7 (*)    Total Protein 9.0 (*)    Albumin 3.4 (*)    Total Bilirubin 2.5 (*)    All other components within normal limits  LIPASE, BLOOD - Abnormal; Notable for the following components:   Lipase 1,458 (*)    All other components within normal limits  URINALYSIS, ROUTINE W REFLEX MICROSCOPIC - Abnormal; Notable for the following components:   Color, Urine AMBER (*)    APPearance CLEAR (*)    Leukocytes,Ua TRACE (*)    All other components within normal limits  TROPONIN I (HIGH SENSITIVITY)  TROPONIN I (HIGH SENSITIVITY)     EKG  ED ECG REPORT I, Merlyn Lot, the attending physician, personally viewed and interpreted this ECG.   Date: 07/17/2022  EKG Time: 6:42  Rate: 99  Rhythm: sinus  Axis: normal  Intervals: normal  ST&T Change: no stmei, no depressions    RADIOLOGY Please see ED Course for my review and interpretation.  I personally reviewed all radiographic images ordered to evaluate for the above acute complaints and reviewed radiology reports and findings.  These findings were personally discussed with the patient.  Please see medical record for radiology report.    PROCEDURES:  Critical Care performed: No  Procedures   MEDICATIONS ORDERED IN ED: Medications  lactated ringers bolus 1,000 mL (has no administration in time range)  morphine (PF) 4 MG/ML injection 4 mg (has no administration in time range)  ondansetron (ZOFRAN) injection 4 mg (has no administration in time range)  iohexol (OMNIPAQUE) 350 MG/ML injection 80 mL (80 mLs Intravenous Contrast Given 07/17/22 0915)     IMPRESSION / MDM / ASSESSMENT AND PLAN / ED COURSE  I  reviewed the triage vital signs and the nursing notes.                              Differential diagnosis includes, but is not limited to, pancreatitis, enteritis, gastritis, biliary pathology, perforation, diverticulitis, hernia, SBO  Patient presenting to the ER for evaluation of symptoms as described above.  Based on symptoms, risk factors and considered above differential, this presenting complaint could reflect a potentially life-threatening illness therefore the patient will be placed on continuous pulse oximetry and telemetry for monitoring.  Laboratory evaluation will be sent to evaluate for the above complaints.  CT imaging ordered triage my review and interpretation does not show any evidence of perforation or obstruction.  Per radiology report there is evidence of acute pancreatitis with pseudocyst formation.  He is afebrile lipase is significantly elevated.  Will give IV fluids as well as IV morphine for pain control.  Given his presentation do feel the patient will require hospitalization.  Have consulted hospitalist for admission.  Patient agreeable with plan        FINAL CLINICAL IMPRESSION(S) / ED DIAGNOSES   Final diagnoses:  Alcohol-induced acute pancreatitis, unspecified complication status     Rx / DC Orders   ED Discharge Orders     None        Note:  This document was prepared using Dragon voice recognition software and may include unintentional dictation errors.    Merlyn Lot, MD 07/17/22 1139

## 2022-07-17 NOTE — ED Provider Triage Note (Signed)
Emergency Medicine Provider Triage Evaluation Note  Luke Rogers , a 61 y.o. male  was evaluated in triage.  Pt complains of epigastric and left upper quadrant abdominal pain that began 2 days ago.  Patient reports that he has had a history of pancreatitis and this feels quite similar.  He reports that he had stopped drinking, though due to family stressors he began drinking again.  He reports that he has been drinking only 2 beers per night, and reports that he used to drink 12 beers per night.  He denies any fevers.  He has had dry heaving.  Review of Systems  Positive: Epigastric abd pain, nausea Negative: fever  Physical Exam  BP 122/82 (BP Location: Left Arm)   Pulse 82   Temp 98.1 F (36.7 C) (Oral)   Resp (!) 22   Ht '6\' 2"'$  (1.88 m)   Wt 77.1 kg   SpO2 99%   BMI 21.83 kg/m  Gen:   Awake, no distress   Resp:  Normal effort  MSK:   Moves extremities without difficulty  Other:    Medical Decision Making  Medically screening exam initiated at 8:51 AM.  Appropriate orders placed.  Luke Rogers. was informed that the remainder of the evaluation will be completed by another provider, this initial triage assessment does not replace that evaluation, and the importance of remaining in the ED until their evaluation is complete.     Marquette Old, PA-C 07/17/22 [phone]

## 2022-07-18 ENCOUNTER — Inpatient Hospital Stay: Payer: 59

## 2022-07-18 DIAGNOSIS — K859 Acute pancreatitis without necrosis or infection, unspecified: Secondary | ICD-10-CM | POA: Diagnosis not present

## 2022-07-18 LAB — COMPREHENSIVE METABOLIC PANEL
ALT: 13 U/L (ref 0–44)
AST: 32 U/L (ref 15–41)
Albumin: 2.8 g/dL — ABNORMAL LOW (ref 3.5–5.0)
Alkaline Phosphatase: 60 U/L (ref 38–126)
Anion gap: 7 (ref 5–15)
BUN: 18 mg/dL (ref 6–20)
CO2: 25 mmol/L (ref 22–32)
Calcium: 7.8 mg/dL — ABNORMAL LOW (ref 8.9–10.3)
Chloride: 101 mmol/L (ref 98–111)
Creatinine, Ser: 0.75 mg/dL (ref 0.61–1.24)
GFR, Estimated: >60 mL/min (ref >60)
Glucose, Bld: 100 mg/dL — ABNORMAL HIGH (ref 70–99)
Potassium: 4 mmol/L (ref 3.5–5.1)
Sodium: 133 mmol/L — ABNORMAL LOW (ref 135–145)
Total Bilirubin: 2.1 mg/dL — ABNORMAL HIGH (ref 0.3–1.2)
Total Protein: 7.3 g/dL (ref 6.5–8.1)

## 2022-07-18 LAB — CBC
HCT: 39.1 % (ref 39.0–52.0)
Hemoglobin: 13.5 g/dL (ref 13.0–17.0)
MCH: 33.6 pg (ref 26.0–34.0)
MCHC: 34.5 g/dL (ref 30.0–36.0)
MCV: 97.3 fL (ref 80.0–100.0)
Platelets: 142 10*3/uL — ABNORMAL LOW (ref 150–400)
RBC: 4.02 MIL/uL — ABNORMAL LOW (ref 4.22–5.81)
RDW: 13.1 % (ref 11.5–15.5)
WBC: 11 10*3/uL — ABNORMAL HIGH (ref 4.0–10.5)
nRBC: 0 % (ref 0.0–0.2)

## 2022-07-18 LAB — HIV ANTIBODY (ROUTINE TESTING W REFLEX): HIV Screen 4th Generation wRfx: NONREACTIVE

## 2022-07-18 LAB — LIPASE, BLOOD: Lipase: 414 U/L — ABNORMAL HIGH (ref 11–51)

## 2022-07-18 MED ORDER — OXYCODONE HCL 5 MG PO TABS
10.0000 mg | ORAL_TABLET | Freq: Four times a day (QID) | ORAL | Status: DC | PRN
  Administered 2022-07-19 – 2022-07-20 (×2): 10 mg via ORAL
  Filled 2022-07-18 (×2): qty 2

## 2022-07-18 MED ORDER — HYDROMORPHONE HCL 1 MG/ML IJ SOLN
1.0000 mg | INTRAMUSCULAR | Status: DC | PRN
  Administered 2022-07-18 – 2022-07-20 (×15): 1 mg via INTRAVENOUS
  Filled 2022-07-18 (×15): qty 1

## 2022-07-18 MED ORDER — GADOBUTROL 1 MMOL/ML IV SOLN
7.0000 mL | Freq: Once | INTRAVENOUS | Status: AC | PRN
  Administered 2022-07-18: 7 mL via INTRAVENOUS

## 2022-07-18 NOTE — Progress Notes (Signed)
[facility] PROGRESS NOTE  Luke Rogers. DXI:[id number] DOB: 1961-07-05 DOA: 07/17/2022 PCP: Pcp, No  Status: Remains inpatient appropriate because: Continues to have pain secondary to acute pancreatitis requiring both oral and IV narcotics.  Also in process of working up abnormalities of the pancreas seen on recent imaging.  Level of care: Med-Surg   Code Status: Full Family Communication: Patient only DVT prophylaxis: SCDs.  Platelets remain greater than 100,000 but given severity of pancreatitis at risk for hemorrhagic pancreatitis therefore we will hold off on pharmaco therapeutic prophylaxis   HPI: 61 year old male with known history of alcohol abuse, alcoholic as well as infectious hepatitis C, tobacco abuse meningitis who presented with abdominal pain radiating to the left shoulder.  This pain had been ongoing for 2 days and was associated with nausea and dry heaves.  In the ER he was found to have an elevated lipase of 1458, normal WBC.  Imaging consistent with acute pancreatitis as well as 2 suspicious areas on the head and tail of the pancreas.  Subjective: Continues to report significant pain primarily radiating to the left shoulder.  States he feels like he could tolerate a clear liquid diet.  Objective: Vitals:   07/18/22 1245 07/18/22 1326  BP: 107/89 126/85  Pulse: 83 84  Resp: 18 18  Temp:  98 F (36.7 C)  SpO2: 92% 94%    Intake/Output Summary (Last 24 hours) at 07/18/2022 1350 Last data filed at 07/17/2022 1711 Gross per 24 hour  Intake 999 ml  Output --  Net 999 ml   Filed Weights   07/17/22 0636  Weight: 77.1 kg    Exam:  Constitutional: NAD, calm, but remains uncomfortable Respiratory: clear to auscultation bilaterally, no wheezing, no crackles. Normal respiratory effort. No accessory muscle use.  Cardiovascular: Regular rate and rhythm, no murmurs / rubs / gallops. No extremity edema. 2+ pedal pulses. No carotid bruits.  Abdomen:  Tender in both upper quadrants more so on the left, no masses palpated. No hepatosplenomegaly. Bowel sounds positive.  Musculoskeletal: no clubbing / cyanosis. No joint deformity upper and lower extremities. Good ROM, no contractures. Normal muscle tone.  Skin: no rashes, lesions, ulcers. No induration Neurologic: CN 2-12 grossly intact. Sensation intact, DTR normal. Strength 5/5 x all 4 extremities.  Psychiatric: Normal judgment and insight. Alert and oriented x 3. Normal mood.    Assessment/Plan: Recurrent acute pancreatitis:  Lipase has decreased from 1458 to 414 Patient continues to complain of pain primarily in left upper quadrant radiating to left shoulder blade CT scan showed a possible 4.4 cm in head of pancreas as well as an area in the pancreatic tail will check MRI with MRCP to better characterize Allow clear liquids-avoid any type of intake that has dairy or fat Increase frequency of IV Dilaudid from every 3 hours to every 2 hours as needed Increase Oxy IR from 5 mg to 10 mg p.o. every 4 hours   Alcohol abuse and Tobacco abuse Admitting physician counseled regarding cessation of both substances Continue CIWA protocol noting after administration of Ativan score has decreased from 12 to 0 Continue nicotine patch   GERD (gastroesophageal reflux disease) Continue Protonix   Left renal mass: -f/u with PCP as out pt work up and follow up    Data Reviewed: Basic Metabolic Panel: Recent Labs  Lab 07/17/22 0643 07/18/22 0445  NA 133* 133*  K 4.2 4.0  CL 101 101  CO2 26 25  GLUCOSE 149* 100*  BUN 16 18  CREATININE 0.72 0.75  CALCIUM 8.7* 7.8*   Liver Function Tests: Recent Labs  Lab 07/17/22 0643 07/18/22 0445  AST 38 32  ALT 15 13  ALKPHOS 77 60  BILITOT 2.5* 2.1*  PROT 9.0* 7.3  ALBUMIN 3.4* 2.8*   Recent Labs  Lab 07/17/22 0643 07/18/22 0445  LIPASE 1,458* 414*   No results for input(s): "AMMONIA" in the last 168 hours. CBC: Recent Labs  Lab  07/17/22 0643 07/18/22 0445  WBC 10.1 11.0*  NEUTROABS 6.6  --   HGB 15.9 13.5  HCT 46.8 39.1  MCV 97.5 97.3  PLT 145* 142*   Cardiac Enzymes: No results for input(s): "CKTOTAL", "CKMB", "CKMBINDEX", "TROPONINI" in the last 168 hours. BNP (last 3 results) No results for input(s): "BNP" in the last 8760 hours.  ProBNP (last 3 results) No results for input(s): "PROBNP" in the last 8760 hours.  CBG: No results for input(s): "GLUCAP" in the last 168 hours.  No results found for this or any previous visit (from the past 240 hour(s)).   Studies: CT Abdomen Pelvis W Contrast  Result Date: 07/17/2022 CLINICAL DATA:  Pancreatitis, acute, severe EXAM: CT ABDOMEN AND PELVIS WITH CONTRAST TECHNIQUE: Multidetector CT imaging of the abdomen and pelvis was performed using the standard protocol following bolus administration of intravenous contrast. RADIATION DOSE REDUCTION: This exam was performed according to the departmental dose-optimization program which includes automated exposure control, adjustment of the mA and/or kV according to patient size and/or use of iterative reconstruction technique. CONTRAST:  28m OMNIPAQUE IOHEXOL 350 MG/ML SOLN COMPARISON:  February 18, 2022. January 17, 2021 FINDINGS: Lower chest: No acute abnormality. Hepatobiliary: Hepatic steatosis. Query a mildly nodular contour of the liver. Gallbladder is mildly distended without definitive ancillary evidence of acute cholecystitis. Pancreas: There is a fluid density mass within the head of the pancreas which measures d 4.4 by 3.6 cm by 3.7 cm (series 2, image 31; series 5, image 31. This previously was 2 separate fluid density areas which spanned approximately 3.1 by 1.6 by 1.4 cm. There is mild pancreatic ductal dilation downstream to this mass measuring up to 5 mm. Revisualization of a punctate radiopaque focus within the pancreatic duct of the uncinate process. There is mild fat stranding adjacent to the pancreas. There is a newly  conspicuous fluid density mass within the pancreatic tail which measures 7 mm (series 2, image 22). Otherwise, the pancreas enhances relatively homogeneously. There is an additional coarse calcification within the pancreatic tail likely reflecting sequela of prior pancreatitis. No evidence of pseudoaneurysm formation. Spleen: Upper limits of normal in size.  Multiple splenules. Adrenals/Urinary Tract: Adrenal glands are unremarkable. There is a high density mass within the superior pole of the LEFT kidney which measures 20 mm which is not significantly changed dating back to 2022. There is an exophytic benign cyst of the superior pole of the RIGHT kidney. Multiple additional subcentimeter hypodense masses are too small to accurately characterize. No hydronephrosis. No obstructing nephrolithiasis. Bladder is completely decompressed. Stomach/Bowel: No evidence of bowel obstruction. Appendix is normal. Large hiatal hernia. Vascular/Lymphatic: Abdominal aorta is normal in course and caliber. Mild atherosclerotic calcifications. There are mildly enlarged perigastric and peripancreatic lymph nodes with representative perigastric lymph node measuring 12 mm in the short axis (series 2, image 19). This is similar in comparison to prior. Reproductive: Prostate is present. Other: Small volume free fluid tracking adjacent to the liver, pancreas and spleen. Moderate volume free fluid in the pelvis. Musculoskeletal: No acute osseous abnormality. IMPRESSION:  1. There is a 4.4 cm fluid density mass within the head of the pancreas which previously reflected to separate fluid density masses. There is mild pancreatic ductal dilation downstream to this mass as well as scattered possible intraductal calcifications. There is mild fat stranding adjacent to the pancreas. Findings are favored to reflect acute on chronic pancreatitis with pseudocyst formation. Recommend correlation with lipase levels. 2. There is a newly conspicuous 7 mm  fluid density mass within the pancreatic tail. This could reflect a pseudocyst or side branch IPMN. Recommend further evaluation with nonemergent abdominal MRI with and without contrast with MRCP. 3. Hepatic steatosis with suggestion of a mildly nodular contour of the liver. This could reflect early cirrhosis. 4. Moderate volume free fluid in the pelvis as well as tracking along the liver, pancreas and spleen. 5. Large hiatal hernia. 6. Similar appearance of mildly enlarged perigastric and peripancreatic lymph nodes. These are likely reactive. 7. There is a high density mass within the superior pole of the LEFT kidney which is not significantly changed dating back to 2022. This could reflect a hemorrhagic cyst. Recommend attention on follow-up abdominal MRI. Electronically Signed   By: Valentino Saxon M.D.   On: 07/17/2022 09:58    Scheduled Meds:  enoxaparin (LOVENOX) injection  40 mg Subcutaneous Y19J   folic acid  1 mg Oral Daily   LORazepam  0-4 mg Intravenous Q6H   Followed by   Derrill Memo ON 07/19/2022] LORazepam  0-4 mg Intravenous Q12H   multivitamin with minerals  1 tablet Oral Daily   nicotine  21 mg Transdermal Daily   pantoprazole  40 mg Oral Q1200   thiamine  100 mg Oral Daily   Or   thiamine  100 mg Intravenous Daily   Continuous Infusions:  sodium chloride 125 mL/hr at 07/18/22 1328    Principal Problem:   Recurrent acute pancreatitis Active Problems:   Alcohol abuse   Tobacco abuse   GERD (gastroesophageal reflux disease)   Pancreatic mass   Left renal mass   Consultants: None  Procedures: None  Antibiotics: None    Time spent: 35 minutes    Erin Hearing ANP  [facility] 7 am - 330 pm/M-F for direct patient care and secure chat Please refer to Amion for contact info 1  days

## 2022-07-18 NOTE — Progress Notes (Signed)
Patient ordered for MRI, RN went into to room and alerted patient that he needed to change into gown and remove clothing. Patient refused stating that the last time he had one done they made him remove his pants and that was all. Educated patient on need to remove all clothing, patient refused. Agreed to remove shoes and ring and to remove pants once downstairs for MRI. Continuing to monitor at bedside.

## 2022-07-19 DIAGNOSIS — K859 Acute pancreatitis without necrosis or infection, unspecified: Secondary | ICD-10-CM | POA: Diagnosis not present

## 2022-07-19 LAB — COMPREHENSIVE METABOLIC PANEL
ALT: 12 U/L (ref 0–44)
AST: 30 U/L (ref 15–41)
Albumin: 2.6 g/dL — ABNORMAL LOW (ref 3.5–5.0)
Alkaline Phosphatase: 66 U/L (ref 38–126)
Anion gap: 4 — ABNORMAL LOW (ref 5–15)
BUN: 13 mg/dL (ref 6–20)
CO2: 26 mmol/L (ref 22–32)
Calcium: 7.5 mg/dL — ABNORMAL LOW (ref 8.9–10.3)
Chloride: 101 mmol/L (ref 98–111)
Creatinine, Ser: 0.63 mg/dL (ref 0.61–1.24)
GFR, Estimated: >60 mL/min (ref >60)
Glucose, Bld: 123 mg/dL — ABNORMAL HIGH (ref 70–99)
Potassium: 3.5 mmol/L (ref 3.5–5.1)
Sodium: 131 mmol/L — ABNORMAL LOW (ref 135–145)
Total Bilirubin: 1.8 mg/dL — ABNORMAL HIGH (ref 0.3–1.2)
Total Protein: 7.1 g/dL (ref 6.5–8.1)

## 2022-07-19 LAB — CBC WITH DIFFERENTIAL/PLATELET
Abs Immature Granulocytes: 0.03 10*3/uL (ref 0.00–0.07)
Basophils Absolute: 0.1 10*3/uL (ref 0.0–0.1)
Basophils Relative: 1 %
Eosinophils Absolute: 0.2 10*3/uL (ref 0.0–0.5)
Eosinophils Relative: 2 %
HCT: 36.5 % — ABNORMAL LOW (ref 39.0–52.0)
Hemoglobin: 12.5 g/dL — ABNORMAL LOW (ref 13.0–17.0)
Immature Granulocytes: 0 %
Lymphocytes Relative: 22 %
Lymphs Abs: 1.9 10*3/uL (ref 0.7–4.0)
MCH: 33.3 pg (ref 26.0–34.0)
MCHC: 34.2 g/dL (ref 30.0–36.0)
MCV: 97.3 fL (ref 80.0–100.0)
Monocytes Absolute: 1.2 10*3/uL — ABNORMAL HIGH (ref 0.1–1.0)
Monocytes Relative: 14 %
Neutro Abs: 5.2 10*3/uL (ref 1.7–7.7)
Neutrophils Relative %: 61 %
Platelets: 144 10*3/uL — ABNORMAL LOW (ref 150–400)
RBC: 3.75 MIL/uL — ABNORMAL LOW (ref 4.22–5.81)
RDW: 13.1 % (ref 11.5–15.5)
WBC: 8.6 10*3/uL (ref 4.0–10.5)
nRBC: 0 % (ref 0.0–0.2)

## 2022-07-19 LAB — GLUCOSE, CAPILLARY: Glucose-Capillary: 113 mg/dL — ABNORMAL HIGH (ref 70–99)

## 2022-07-19 MED ORDER — GUAIFENESIN-DM 100-10 MG/5ML PO SYRP
5.0000 mL | ORAL_SOLUTION | ORAL | Status: DC | PRN
  Administered 2022-07-19: 5 mL via ORAL
  Filled 2022-07-19: qty 10

## 2022-07-19 NOTE — Plan of Care (Signed)

## 2022-07-20 DIAGNOSIS — K859 Acute pancreatitis without necrosis or infection, unspecified: Secondary | ICD-10-CM | POA: Diagnosis not present

## 2022-07-20 LAB — CBC WITH DIFFERENTIAL/PLATELET
Abs Immature Granulocytes: 0.02 10*3/uL (ref 0.00–0.07)
Basophils Absolute: 0.1 10*3/uL (ref 0.0–0.1)
Basophils Relative: 1 %
Eosinophils Absolute: 0.3 10*3/uL (ref 0.0–0.5)
Eosinophils Relative: 4 %
HCT: 34.8 % — ABNORMAL LOW (ref 39.0–52.0)
Hemoglobin: 12.1 g/dL — ABNORMAL LOW (ref 13.0–17.0)
Immature Granulocytes: 0 %
Lymphocytes Relative: 29 %
Lymphs Abs: 2.3 10*3/uL (ref 0.7–4.0)
MCH: 33.8 pg (ref 26.0–34.0)
MCHC: 34.8 g/dL (ref 30.0–36.0)
MCV: 97.2 fL (ref 80.0–100.0)
Monocytes Absolute: 1.2 10*3/uL — ABNORMAL HIGH (ref 0.1–1.0)
Monocytes Relative: 15 %
Neutro Abs: 4.2 10*3/uL (ref 1.7–7.7)
Neutrophils Relative %: 51 %
Platelets: 156 10*3/uL (ref 150–400)
RBC: 3.58 MIL/uL — ABNORMAL LOW (ref 4.22–5.81)
RDW: 12.9 % (ref 11.5–15.5)
WBC: 8 10*3/uL (ref 4.0–10.5)
nRBC: 0 % (ref 0.0–0.2)

## 2022-07-20 LAB — GLUCOSE, CAPILLARY: Glucose-Capillary: 97 mg/dL (ref 70–99)

## 2022-07-20 LAB — COMPREHENSIVE METABOLIC PANEL
ALT: 13 U/L (ref 0–44)
AST: 37 U/L (ref 15–41)
Albumin: 2.5 g/dL — ABNORMAL LOW (ref 3.5–5.0)
Alkaline Phosphatase: 74 U/L (ref 38–126)
Anion gap: 8 (ref 5–15)
BUN: 9 mg/dL (ref 6–20)
CO2: 25 mmol/L (ref 22–32)
Calcium: 7.6 mg/dL — ABNORMAL LOW (ref 8.9–10.3)
Chloride: 99 mmol/L (ref 98–111)
Creatinine, Ser: 0.65 mg/dL (ref 0.61–1.24)
GFR, Estimated: >60 mL/min (ref >60)
Glucose, Bld: 89 mg/dL (ref 70–99)
Potassium: 3.5 mmol/L (ref 3.5–5.1)
Sodium: 132 mmol/L — ABNORMAL LOW (ref 135–145)
Total Bilirubin: 1.9 mg/dL — ABNORMAL HIGH (ref 0.3–1.2)
Total Protein: 6.8 g/dL (ref 6.5–8.1)

## 2022-07-20 MED ORDER — DIPHENHYDRAMINE HCL 25 MG PO CAPS: 25.0000 mg | ORAL_CAPSULE | Freq: Four times a day (QID) | ORAL | Status: DC | PRN

## 2022-07-20 MED ORDER — DIPHENHYDRAMINE HCL 25 MG PO CAPS
50.0000 mg | ORAL_CAPSULE | Freq: Once | ORAL | Status: AC
  Administered 2022-07-20: 50 mg via ORAL
  Filled 2022-07-20: qty 2

## 2022-07-20 NOTE — Progress Notes (Signed)
Mobility Specialist - Progress Note   07/20/22 1528  Mobility  Activity Ambulated independently in room  Level of Assistance Independent  Assistive Device None  Distance Ambulated (ft) 20 ft  Activity Response Tolerated well  $Mobility charge 1 Mobility   Pt standing by stink upon entry, utilizing RA. Pt requesting for IV to be removed for D/C, RN notified. Pt left ambulating in room with needs within reach, no complaints.   Candie Mile Mobility Specialist 07/20/22 3:32 PM

## 2022-07-20 NOTE — Progress Notes (Addendum)
[facility] PROGRESS NOTE  Luke Rogers. EGB:[id number] DOB: 1961/04/23 DOA: 07/17/2022 PCP: Pcp, No   HPI: 61 year old male with known history of alcohol abuse, alcoholic as well as infectious hepatitis C, tobacco abuse meningitis who presented with abdominal pain radiating to the left shoulder.  This pain had been ongoing for 2 days and was associated with nausea and dry heaves.  In the ER he was found to have an elevated lipase of 1458, normal WBC.  Imaging consistent with acute pancreatitis as well as 2 suspicious areas on the head and tail of the pancreas.  Hospital course :  Found to have pseudocysts on MRI imaging on head and tail of pancreas with no evidence of infection or necrosis.   Subjective: He thinks belly pain improving.  Reports a mild cough and trying to bring up phlegm that's been going on for days. Reports some vomiting this morning.   Objective: Vitals:   07/20/22 0036 07/20/22 0159  BP: 110/64   Pulse: 76 69  Resp: (!) 24   Temp: 98.2 F (36.8 C)   SpO2: 90% 96%    Intake/Output Summary (Last 24 hours) at 07/20/2022 3710 Last data filed at 07/19/2022 1048 Gross per 24 hour  Intake 480 ml  Output --  Net 480 ml    Filed Weights   07/17/22 0636  Weight: 77.1 kg    Exam:  Constitutional: NAD, calm, but remains uncomfortable Respiratory: clear to auscultation bilaterally, no wheezing, no crackles. Normal respiratory effort. No accessory muscle use.  Cardiovascular: Regular rate and rhythm, no murmurs / rubs / gallops. No extremity edema. 2+ pedal pulses. No carotid bruits.  Abdomen:. Bowel sounds positive. Soft abdomen, distended, mild tenderness throughout with no rebound tenderness Musculoskeletal: no clubbing / cyanosis. No joint deformity upper and lower extremities. Good ROM, no contractures. Normal muscle tone.  Skin: no rashes, lesions, ulcers. No induration Neurologic: CN 2-12 grossly intact. Sensation intact, DTR normal. Strength  5/5 x all 4 extremities.  Psychiatric: Normal judgment and insight. Alert and oriented x 3. Normal mood.    Assessment/Plan: Recurrent acute pancreatitis wioth evidence of non infected pseudocysts, improving Able to tolerate liquid diet in last 24 hours.  Lipase has decreased. Pain is better managed with only one episode of vomting.  -Advance diet to soft diet,avoid any type of intake that has dairy or fat -continue same pain regimen with V Dilaudid from every 2 hours as needed oxy IR from  10 mg p.o. every 4 hours PRN moderate pain -D/C IVF fluids since tolerating clear liquids -PRN antiemetics   Alcohol abuse and Tobacco abuse, stable No sign of withdrawal  Continue CIWA protocol  Continue nicotine patch MVI, folic acid, thiamine  Reported cough Without fever, no cncerning findings on lung exam, on room air.  -supportive care with robitussin DM -incentive spirometry   GERD (gastroesophageal reflux disease) Continue Protonix   Left renal mass: -f/u with PCP as out pt work up and follow up    Data Reviewed: Basic Metabolic Panel: Recent Labs  Lab 07/17/22 0643 07/18/22 0445 07/19/22 0952  NA 133* 133* 131*  K 4.2 4.0 3.5  CL 101 101 101  CO2 '26 25 26  '$ GLUCOSE 149* 100* 123*  BUN '16 18 13  '$ CREATININE 0.72 0.75 0.63  CALCIUM 8.7* 7.8* 7.5*    Liver Function Tests: Recent Labs  Lab 07/17/22 0643 07/18/22 0445 07/19/22 0952  AST 38 32 30  ALT '15 13 12  '$ ALKPHOS 77 60 66  BILITOT 2.5* 2.1* 1.8*  PROT 9.0* 7.3 7.1  ALBUMIN 3.4* 2.8* 2.6*    Recent Labs  Lab 07/17/22 0643 07/18/22 0445  LIPASE 1,458* 414*    No results for input(s): "AMMONIA" in the last 168 hours. CBC: Recent Labs  Lab 07/17/22 0643 07/18/22 0445 07/19/22 0952  WBC 10.1 11.0* 8.6  NEUTROABS 6.6  --  5.2  HGB 15.9 13.5 12.5*  HCT 46.8 39.1 36.5*  MCV 97.5 97.3 97.3  PLT 145* 142* 144*    Cardiac Enzymes: No results for input(s): "CKTOTAL", "CKMB", "CKMBINDEX", "TROPONINI"  in the last 168 hours. BNP (last 3 results) No results for input(s): "BNP" in the last 8760 hours.  ProBNP (last 3 results) No results for input(s): "PROBNP" in the last 8760 hours.  CBG: Recent Labs  Lab 07/19/22 0741  GLUCAP 113*    No results found for this or any previous visit (from the past 240 hour(s)).   Studies: MR ABDOMEN MRCP W WO CONTAST  Result Date: 07/18/2022 CLINICAL DATA:  Abdominal pain over the last 2 days with nausea. Elevated lipase EXAM: MRI ABDOMEN WITHOUT AND WITH CONTRAST (INCLUDING MRCP) TECHNIQUE: Multiplanar multisequence MR imaging of the abdomen was performed both before and after the administration of intravenous contrast. Heavily T2-weighted images of the biliary and pancreatic ducts were obtained, and three-dimensional MRCP images were rendered by post processing. CONTRAST:  58m GADAVIST GADOBUTROL 1 MMOL/ML IV SOLN COMPARISON:  Multiple exams, including CT exams from 07/17/2022 and 01/17/2021 FINDINGS: Despite efforts by the technologist and patient, motion artifact is present on today's exam and could not be eliminated. This reduces exam sensitivity and specificity. Lower chest: Moderate-sized hiatal hernia. There also appears to be some ascites in the hernia sac. Small bilateral pleural effusions. Atelectasis or mild pneumonia in the left lower lobe posteriorly. Hepatobiliary: Several tiny hepatic cysts are present and are felt to be inconsequential. No biliary dilatation. No filling defect in the common bile duct. No well-defined gallstones are observed in the gallbladder. Upper abdominal ascites includes perihepatic ascites. Comparing in an out-of-phase images, there is no significant degree of hepatic steatosis. Pancreas: Mildly complex cystic lesion in the pancreatic body and head demonstrates no significant internal enhancement and measures 5.0 by 3.9 by 4.2 cm. This lesion exerts some minimal flattening on the SMV and was not present on 01/17/2021. The  lesion measured only about 1.7 cm in diameter on 02/18/2022. 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 No thick marginal enhancement to suggest abscess. No internal gas signal. Along the tail the pancreas, a 0.9 by 0.7 cm fluid signal intensity lesion on image 18 of series 4 does not enhance. This was not present on 02/18/2022. This is also probably a small pseudocyst. Similarly, in the head of pancreas just posterior to the main pseudocyst, a 1.4 by 0.7 cm fluid signal intensity structure does not enhance and may represent a small pseudocyst. This lesion was not present on 01/17/2021. There is some very mild peripancreatic stranding such that mild acute pancreatitis is not excluded, but there no findings of pancreatic necrosis or abscess. Spleen:  Unremarkable.  Splenic vein is currently patent Adrenals/Urinary Tract: Bosniak category 1 and category 2 cysts of both kidneys are present and are considered benign. In particular the complex exophytic 2.0 by 1.7 cm lesion from the left  kidney upper pole noted on the recent CT scan does not enhance and is a Bosniak category 2 cyst. No further imaging workup of these lesions is indicated. Both adrenal glands appear normal. Stomach/Bowel: Hiatal hernia as noted above. Otherwise unremarkable. Vascular/Lymphatic: Patent portal vein and SMV. 1.2 cm right gastric lymph node on image 37 series 20, likely reactive. Other:  Upper abdominal ascites. Musculoskeletal: Unremarkable IMPRESSION: 1. Mildly complex cystic lesion in the pancreatic body and head demonstrates no significant internal enhancement. 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 No thick marginal enhancement to suggest abscess. 2. There are 2 additional small fluid signal intensity lesions in the head and tail of the pancreas which do not enhance and which were not present on 01/17/2021. Given the patient's intervening bouts of pancreatitis these are highly likely to be small pseudocysts. 3. Mild peripancreatic stranding such that mild acute pancreatitis is not excluded. No findings of pancreatic necrosis or abscess. 4. Upper abdominal ascites. 5. Moderate-sized hiatal hernia. 6. Small bilateral pleural effusions. Atelectasis or mild pneumonia in the left lower lobe posteriorly. 7. 1.2 cm right gastric lymph node, likely reactive. 8. Bosniak category 1 and category 2 cysts of both kidneys. No further imaging workup of these lesions is indicated. Electronically Signed   By: Van Clines M.D.   On: 07/18/2022 20:59   MR 3D Recon At Scanner  Result Date: 07/18/2022 CLINICAL DATA:  Abdominal pain over the last 2 days with nausea. Elevated lipase EXAM: MRI ABDOMEN WITHOUT AND WITH CONTRAST (INCLUDING MRCP) TECHNIQUE: Multiplanar multisequence MR imaging of the abdomen was performed both before and after the administration of intravenous contrast. Heavily T2-weighted images of the biliary and pancreatic ducts were obtained, and three-dimensional MRCP images were rendered by post processing. CONTRAST:  2m GADAVIST GADOBUTROL 1 MMOL/ML IV SOLN COMPARISON:  Multiple exams, including CT exams from 07/17/2022 and 01/17/2021 FINDINGS: Despite efforts by the technologist and patient, motion artifact is present on today's exam and could not be eliminated. This reduces exam sensitivity and specificity. Lower chest: Moderate-sized hiatal hernia. There also appears to be some ascites in the hernia sac. Small bilateral pleural effusions. Atelectasis or mild pneumonia in the left lower lobe posteriorly. Hepatobiliary: Several tiny  hepatic cysts are present and are felt to be inconsequential. No biliary dilatation. No filling defect in the common bile duct. No well-defined gallstones are observed in the gallbladder. Upper abdominal ascites includes perihepatic ascites. Comparing in an out-of-phase images, there is no significant degree of hepatic steatosis. Pancreas: Mildly complex cystic lesion in the pancreatic body and head demonstrates no significant internal enhancement and measures 5.0 by 3.9 by 4.2 cm. This lesion exerts some minimal flattening on the SMV and was not present on 01/17/2021. The lesion measured only about 1.7 cm in diameter on 02/18/2022. 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 No thick marginal enhancement to suggest abscess. No internal gas signal. Along the tail the pancreas, a 0.9 by 0.7 cm fluid signal intensity lesion on image 18 of series 4 does not enhance. This was not present on 02/18/2022. This is also probably a small pseudocyst. Similarly, in the head of pancreas  just posterior to the main pseudocyst, a 1.4 by 0.7 cm fluid signal intensity structure does not enhance and may represent a small pseudocyst. This lesion was not present on 01/17/2021. There is some very mild peripancreatic stranding such that mild acute pancreatitis is not excluded, but there no findings of pancreatic necrosis or abscess. Spleen:  Unremarkable.  Splenic vein is currently patent Adrenals/Urinary Tract: Bosniak category 1 and category 2 cysts of both kidneys are present and are considered benign. In particular the complex exophytic 2.0 by 1.7 cm lesion from the left kidney upper pole noted on the recent CT scan does not enhance and is a Bosniak category 2 cyst. No further imaging workup of these lesions is indicated. Both  adrenal glands appear normal. Stomach/Bowel: Hiatal hernia as noted above. Otherwise unremarkable. Vascular/Lymphatic: Patent portal vein and SMV. 1.2 cm right gastric lymph node on image 37 series 20, likely reactive. Other:  Upper abdominal ascites. Musculoskeletal: Unremarkable IMPRESSION: 1. Mildly complex cystic lesion in the pancreatic body and head demonstrates no significant internal enhancement. 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 No thick marginal enhancement to suggest abscess. 2. There are 2 additional small fluid signal intensity lesions in the head and tail of the pancreas which do not enhance and which were not present on 01/17/2021. Given the patient's intervening bouts of pancreatitis these are highly likely to be small pseudocysts. 3. Mild peripancreatic stranding such that mild acute pancreatitis is not excluded. No findings of pancreatic necrosis or abscess. 4. Upper abdominal ascites. 5. Moderate-sized hiatal hernia. 6. Small bilateral pleural effusions. Atelectasis or mild pneumonia in the left lower lobe posteriorly. 7. 1.2 cm right gastric lymph node, likely reactive. 8. Bosniak category 1 and category 2 cysts of both kidneys. No further imaging workup of these lesions is indicated. Electronically Signed   By: Van Clines M.D.   On: 07/18/2022 20:59    Scheduled Meds:  enoxaparin (LOVENOX) injection  40 mg Subcutaneous L24M   folic acid  1 mg Oral Daily   LORazepam  0-4 mg Intravenous Q12H   multivitamin with minerals  1 tablet Oral Daily   nicotine  21 mg Transdermal Daily   pantoprazole  40 mg Oral Q1200   thiamine  100 mg Oral Daily   Or   thiamine  100 mg Intravenous Daily   Continuous Infusions:  sodium chloride Stopped (07/19/22 0625)    Principal Problem:    Recurrent acute pancreatitis Active Problems:   Alcohol abuse   Tobacco abuse   GERD (gastroesophageal reflux disease)   Pancreatic mass   Left renal mass   Consultants: None  Procedures: None  Antibiotics: None    Time spent: 35 minutes    Christop Hippert D Roneshia Drew ANP  [facility] 7 am - 330 pm/M-F for direct patient care and secure chat Please refer to Amion for contact info 3  days  Status: Remains inpatient appropriate because: Continues to have pain secondary to acute pancreatitis requiring both oral and IV narcotics.  Also in process of working up abnormalities of the pancreas seen on recent imaging.  Level of care: Med-Surg   Code Status: Full Family Communication: Patient only DVT prophylaxis: SCDs.  Platelets remain greater than 100,000 but given severity of pancreatitis at risk for hemorrhagic pancreatitis therefore we will hold off on pharmaco therapeutic  prophylaxis

## 2022-07-20 NOTE — Progress Notes (Signed)
       CROSS COVER NOTE  NAME: [name]. MRN: [id number] DOB : 1960-10-10 ATTENDING PHYSICIAN: Desiree Hane, MD    Date of Service   07/20/2022   HPI/Events of Note   Message received from nursing reporting a rash on arms, chest, and upper back. Nursing describes rash as hives. IV Benadryl ordered.  ON bedside evaluation Mr Knust has red macules on his arms, chest, and upper back. He reports they have been present since prior to arrival on arms and chest but he just noticed the back tonight. He reports pruritus since atleast Monday. On my evaluation the areas are not raised and do not look like hives at this time. He had received IV benadryl prior to my evaluation. There could be an element of histamine release with IV dilaudid contributing. Patient educated and asked to let nursing staff know if his symptoms worsen after receiving IV Dilaudid.  Interventions   Assessment/Plan:  Benadryl PRN       To reach the provider On-Call:   7AM- 7PM see care teams to locate the attending and reach out to them via [email or web address]. 7PM-7AM contact night-coverage If you still have difficulty reaching the appropriate provider, please page the [facility] (Director on Call) for [facility] on amion for assistance  This document was prepared using Set designer software and may include unintentional dictation errors.  Neomia Glass DNP, MBA, FNP-BC Nurse Practitioner [facility] Pager [phone]

## 2022-07-20 NOTE — Discharge Summary (Signed)
Physician Discharge Summary  Luke Rogers. JOA:[id number] DOB: 01-30-1961 DOA: 07/17/2022  PCP: Pcp, No  Admit date: 07/17/2022 Discharge date: 07/20/2022  Admitted From: Home Disposition: Home  Recommendations for Outpatient Follow-up:  Continue to encourage alcohol/tobacco cessation  Home Health: No needs identified by PT Equipment/Devices: None  Discharge Condition: Stable CODE STATUS: Full code Diet recommendation: Heart healthy/low-fat diet  History of present illness:  Luke Rogers. is a 61 year old male with past medical history significant of EtOH abuse, history of hepatitis C, tobacco use disorder who presented to [facility] ED on 12/25 with abdominal pain.  Onset 2 days prior associated with nausea and dry heaves.  Workup in the ED notable for elevated lipase of 1458, normal WBC count.  Imaging consistent with acute pancreatitis.  Patient was started on IV fluids, TRH consulted for admission for further evaluation management of acute hepatitis in the setting of continued EtOH abuse.  Hospital course:  Acute pancreatitis with pseudocyst 2/2 continued EtOH abuse Patient presenting to ED with 2-day onset of nausea and dry heaves.  Noted to have an elevated lipase of 1458 with normal WBC count.  Imaging notable for stranding surrounding the pancreas and evidence of pseudocyst.  Patient was started on IV fluids and diet was slowly advanced with toleration.  Discussed need for complete EtOH cessation.  Alcohol use disorder Patient was monitored on CIWA protocol with no signs of withdrawal symptoms during hospitalization.  Discussed need for complete cessation.  Tobacco use disorder Continue to encourage tobacco cessation.  Weakness/deconditioning: Seen by PT with no needs identified.  Discharge Diagnoses:  Principal Problem:   Recurrent acute pancreatitis Active Problems:   Alcohol abuse   Tobacco abuse   GERD (gastroesophageal reflux disease)   Pancreatic  mass   Left renal mass    Discharge Instructions  Discharge Instructions     Call MD for:  difficulty breathing, headache or visual disturbances   Complete by: As directed    Call MD for:  extreme fatigue   Complete by: As directed    Call MD for:  persistant dizziness or light-headedness   Complete by: As directed    Call MD for:  persistant nausea and vomiting   Complete by: As directed    Call MD for:  severe uncontrolled pain   Complete by: As directed    Call MD for:  temperature >100.4   Complete by: As directed    Diet - low sodium heart healthy   Complete by: As directed    Increase activity slowly   Complete by: As directed       Allergies as of 07/20/2022   No Known Allergies      Medication List     STOP taking these medications    folic acid 1 MG tablet Commonly known as: FOLVITE   lactulose 10 GM/15ML solution Commonly known as: CHRONULAC   meloxicam 15 MG tablet Commonly known as: MOBIC   multivitamin with minerals Tabs tablet   ondansetron 4 MG tablet Commonly known as: Zofran   pantoprazole 40 MG tablet Commonly known as: PROTONIX   thiamine 100 MG tablet Commonly known as: VITAMIN B1   traMADol 50 MG tablet Commonly known as: Ultram       TAKE these medications    acetaminophen 325 MG tablet Commonly known as: TYLENOL Take 2 tablets (650 mg total) by mouth every 6 (six) hours as needed for mild pain (or Fever >/= 101).  No Known Allergies  Consultations: None   Procedures/Studies: MR ABDOMEN MRCP W WO CONTAST  Result Date: 07/18/2022 CLINICAL DATA:  Abdominal pain over the last 2 days with nausea. Elevated lipase EXAM: MRI ABDOMEN WITHOUT AND WITH CONTRAST (INCLUDING MRCP) TECHNIQUE: Multiplanar multisequence MR imaging of the abdomen was performed both before and after the administration of intravenous contrast. Heavily T2-weighted images of the biliary and pancreatic ducts were obtained, and three-dimensional  MRCP images were rendered by post processing. CONTRAST:  25m GADAVIST GADOBUTROL 1 MMOL/ML IV SOLN COMPARISON:  Multiple exams, including CT exams from 07/17/2022 and 01/17/2021 FINDINGS: Despite efforts by the technologist and patient, motion artifact is present on today's exam and could not be eliminated. This reduces exam sensitivity and specificity. Lower chest: Moderate-sized hiatal hernia. There also appears to be some ascites in the hernia sac. Small bilateral pleural effusions. Atelectasis or mild pneumonia in the left lower lobe posteriorly. Hepatobiliary: Several tiny hepatic cysts are present and are felt to be inconsequential. No biliary dilatation. No filling defect in the common bile duct. No well-defined gallstones are observed in the gallbladder. Upper abdominal ascites includes perihepatic ascites. Comparing in an out-of-phase images, there is no significant degree of hepatic steatosis. Pancreas: Mildly complex cystic lesion in the pancreatic body and head demonstrates no significant internal enhancement and measures 5.0 by 3.9 by 4.2 cm. This lesion exerts some minimal flattening on the SMV and was not present on 01/17/2021. The lesion measured only about 1.7 cm in diameter on 02/18/2022. 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 No thick marginal enhancement to suggest abscess. No internal gas signal. Along the tail the pancreas, a 0.9 by 0.7 cm fluid signal intensity lesion on image 18 of series 4 does not enhance. This was not present on 02/18/2022. This is also probably a small pseudocyst. Similarly, in the head of pancreas just posterior to the main pseudocyst, a 1.4 by 0.7 cm fluid signal intensity structure does not enhance and may represent a small pseudocyst. This lesion was not  present on 01/17/2021. There is some very mild peripancreatic stranding such that mild acute pancreatitis is not excluded, but there no findings of pancreatic necrosis or abscess. Spleen:  Unremarkable.  Splenic vein is currently patent Adrenals/Urinary Tract: Bosniak category 1 and category 2 cysts of both kidneys are present and are considered benign. In particular the complex exophytic 2.0 by 1.7 cm lesion from the left kidney upper pole noted on the recent CT scan does not enhance and is a Bosniak category 2 cyst. No further imaging workup of these lesions is indicated. Both adrenal glands appear normal. Stomach/Bowel: Hiatal hernia as noted above. Otherwise unremarkable. Vascular/Lymphatic: Patent portal vein and SMV. 1.2 cm right gastric lymph node on image 37 series 20, likely reactive. Other:  Upper abdominal ascites. Musculoskeletal: Unremarkable IMPRESSION: 1. Mildly complex cystic lesion in the pancreatic body and head demonstrates no significant internal enhancement. 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 No thick marginal enhancement to suggest abscess. 2. There are 2 additional small fluid signal intensity lesions in the head and tail of the pancreas which do not enhance and which were not  present on 01/17/2021. Given the patient's intervening bouts of pancreatitis these are highly likely to be small pseudocysts. 3. Mild peripancreatic stranding such that mild acute pancreatitis is not excluded. No findings of pancreatic necrosis or abscess. 4. Upper abdominal ascites. 5. Moderate-sized hiatal hernia. 6. Small bilateral pleural effusions. Atelectasis or mild pneumonia in the left lower lobe posteriorly. 7. 1.2 cm right gastric lymph node, likely reactive. 8. Bosniak category 1 and category 2 cysts of  both kidneys. No further imaging workup of these lesions is indicated. Electronically Signed   By: Van Clines M.D.   On: 07/18/2022 20:59   MR 3D Recon At Scanner  Result Date: 07/18/2022 CLINICAL DATA:  Abdominal pain over the last 2 days with nausea. Elevated lipase EXAM: MRI ABDOMEN WITHOUT AND WITH CONTRAST (INCLUDING MRCP) TECHNIQUE: Multiplanar multisequence MR imaging of the abdomen was performed both before and after the administration of intravenous contrast. Heavily T2-weighted images of the biliary and pancreatic ducts were obtained, and three-dimensional MRCP images were rendered by post processing. CONTRAST:  34m GADAVIST GADOBUTROL 1 MMOL/ML IV SOLN COMPARISON:  Multiple exams, including CT exams from 07/17/2022 and 01/17/2021 FINDINGS: Despite efforts by the technologist and patient, motion artifact is present on today's exam and could not be eliminated. This reduces exam sensitivity and specificity. Lower chest: Moderate-sized hiatal hernia. There also appears to be some ascites in the hernia sac. Small bilateral pleural effusions. Atelectasis or mild pneumonia in the left lower lobe posteriorly. Hepatobiliary: Several tiny hepatic cysts are present and are felt to be inconsequential. No biliary dilatation. No filling defect in the common bile duct. No well-defined gallstones are observed in the gallbladder. Upper abdominal ascites includes perihepatic ascites. Comparing in an out-of-phase images, there is no significant degree of hepatic steatosis. Pancreas: Mildly complex cystic lesion in the pancreatic body and head demonstrates no significant internal enhancement and measures 5.0 by 3.9 by 4.2 cm. This lesion exerts some minimal flattening on the SMV and was not present on 01/17/2021. The lesion measured only about 1.7 cm in diameter on 02/18/2022. 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 No thick marginal enhancement to suggest abscess. No internal gas signal. Along the tail the pancreas, a 0.9 by 0.7 cm fluid signal intensity lesion on image 18 of series 4 does not enhance. This was not present on 02/18/2022. This is also probably a small pseudocyst. Similarly, in the head of pancreas just posterior to the main pseudocyst, a 1.4 by 0.7 cm fluid signal intensity structure does not enhance and may represent a small pseudocyst. This lesion was not present on 01/17/2021. There is some very mild peripancreatic stranding such that mild acute pancreatitis is not excluded, but there no findings of pancreatic necrosis or abscess. Spleen:  Unremarkable.  Splenic vein is currently patent Adrenals/Urinary Tract: Bosniak category 1 and category 2 cysts of both kidneys are present and are considered benign. In particular the complex exophytic 2.0 by 1.7 cm lesion from the left kidney upper pole noted on the recent CT scan does not enhance and is a Bosniak category 2 cyst. No further imaging workup of these lesions is indicated. Both adrenal glands appear normal. Stomach/Bowel: Hiatal hernia as noted above. Otherwise unremarkable. Vascular/Lymphatic: Patent portal vein and SMV. 1.2 cm right gastric lymph node on image 37 series 20, likely  reactive. Other:  Upper abdominal ascites. Musculoskeletal: Unremarkable IMPRESSION: 1. Mildly complex cystic lesion in the pancreatic body and head demonstrates no significant internal enhancement. 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 No thick marginal enhancement to suggest abscess. 2. There are 2 additional small fluid signal intensity lesions in the head and  tail of the pancreas which do not enhance and which were not present on 01/17/2021. Given the patient's intervening bouts of pancreatitis these are highly likely to be small pseudocysts. 3. Mild peripancreatic stranding such that mild acute pancreatitis is not excluded. No findings of pancreatic necrosis or abscess. 4. Upper abdominal ascites. 5. Moderate-sized hiatal hernia. 6. Small bilateral pleural effusions. Atelectasis or mild pneumonia in the left lower lobe posteriorly. 7. 1.2 cm right gastric lymph node, likely reactive. 8. Bosniak category 1 and category 2 cysts of both kidneys. No further imaging workup of these lesions is indicated. Electronically Signed   By: Van Clines M.D.   On: 07/18/2022 20:59   CT Abdomen Pelvis W Contrast  Result Date: 07/17/2022 CLINICAL DATA:  Pancreatitis, acute, severe EXAM: CT ABDOMEN AND PELVIS WITH CONTRAST TECHNIQUE: Multidetector CT imaging of the abdomen and pelvis was performed using the standard protocol following bolus administration of intravenous contrast. RADIATION DOSE REDUCTION: This exam was performed according to the departmental dose-optimization program which includes automated exposure control, adjustment of the mA and/or kV according to patient size and/or use of iterative reconstruction technique. CONTRAST:  62m OMNIPAQUE IOHEXOL 350 MG/ML SOLN COMPARISON:  February 18, 2022. January 17, 2021 FINDINGS: Lower chest: No acute abnormality. Hepatobiliary: Hepatic steatosis. Query a mildly nodular contour of the liver. Gallbladder is mildly distended without definitive ancillary evidence of acute cholecystitis. Pancreas: There is a fluid density mass within the head of the pancreas which measures d 4.4 by 3.6 cm by 3.7 cm (series 2, image 31; series 5, image 31. This previously was 2 separate fluid density areas which spanned approximately 3.1 by 1.6 by 1.4 cm. There is mild pancreatic ductal dilation downstream to this mass measuring up to 5 mm.  Revisualization of a punctate radiopaque focus within the pancreatic duct of the uncinate process. There is mild fat stranding adjacent to the pancreas. There is a newly conspicuous fluid density mass within the pancreatic tail which measures 7 mm (series 2, image 22). Otherwise, the pancreas enhances relatively homogeneously. There is an additional coarse calcification within the pancreatic tail likely reflecting sequela of prior pancreatitis. No evidence of pseudoaneurysm formation. Spleen: Upper limits of normal in size.  Multiple splenules. Adrenals/Urinary Tract: Adrenal glands are unremarkable. There is a high density mass within the superior pole of the LEFT kidney which measures 20 mm which is not significantly changed dating back to 2022. There is an exophytic benign cyst of the superior pole of the RIGHT kidney. Multiple additional subcentimeter hypodense masses are too small to accurately characterize. No hydronephrosis. No obstructing nephrolithiasis. Bladder is completely decompressed. Stomach/Bowel: No evidence of bowel obstruction. Appendix is normal. Large hiatal hernia. Vascular/Lymphatic: Abdominal aorta is normal in course and caliber. Mild atherosclerotic calcifications. There are mildly enlarged perigastric and peripancreatic lymph nodes with representative perigastric lymph node measuring 12 mm in the short axis (series 2, image 19). This is similar in comparison to prior. Reproductive: Prostate is present. Other: Small volume free fluid tracking adjacent  to the liver, pancreas and spleen. Moderate volume free fluid in the pelvis. Musculoskeletal: No acute osseous abnormality. IMPRESSION: 1. There is a 4.4 cm fluid density mass within the head of the pancreas which previously reflected to separate fluid density masses. There is mild pancreatic ductal dilation downstream to this mass as well as scattered possible intraductal calcifications. There is mild fat stranding adjacent to the pancreas.  Findings are favored to reflect acute on chronic pancreatitis with pseudocyst formation. Recommend correlation with lipase levels. 2. There is a newly conspicuous 7 mm fluid density mass within the pancreatic tail. This could reflect a pseudocyst or side branch IPMN. Recommend further evaluation with nonemergent abdominal MRI with and without contrast with MRCP. 3. Hepatic steatosis with suggestion of a mildly nodular contour of the liver. This could reflect early cirrhosis. 4. Moderate volume free fluid in the pelvis as well as tracking along the liver, pancreas and spleen. 5. Large hiatal hernia. 6. Similar appearance of mildly enlarged perigastric and peripancreatic lymph nodes. These are likely reactive. 7. There is a high density mass within the superior pole of the LEFT kidney which is not significantly changed dating back to 2022. This could reflect a hemorrhagic cyst. Recommend attention on follow-up abdominal MRI. Electronically Signed   By: Valentino Saxon M.D.   On: 07/17/2022 09:58     Subjective: Patient seen examined bedside, resting comfortably.  Walking around room.  Tolerating diet.  Ready for discharge home.  Discussed need for complete cessation from tobacco and alcohol use as this is the leading etiology to his hospitalization.  No other specific questions or concerns at this time.  Denies headache, no dizziness, no fever/chills/night sweats, no nausea/vomiting/diarrhea, no focal weakness, no fatigue, no paresthesias.  RN reported mild rash overnight in which patient states has been present prior to admission with no acute concerns, otherwise no acute events overnight per nursing staff.  Discharge Exam: Vitals:   07/20/22 0359 07/20/22 0755  BP: 130/74 132/82  Pulse: 70 81  Resp: 17 20  Temp: 99 F (37.2 C) 98.7 F (37.1 C)  SpO2: 92% 91%   Vitals:   07/20/22 0036 07/20/22 0159 07/20/22 0359 07/20/22 0755  BP: 110/64  130/74 132/82  Pulse: 76 69 70 81  Resp: (!) '24  17 20   '$ Temp: 98.2 F (36.8 C)  99 F (37.2 C) 98.7 F (37.1 C)  TempSrc: Oral  Oral Oral  SpO2: 90% 96% 92% 91%  Weight:      Height:        Physical Exam: GEN: NAD, alert and oriented x 3, chronically ill in appearance, disheveled, appears older than stated age HEENT: NCAT, PERRL, EOMI, sclera clear, MMM PULM: CTAB w/o wheezes/crackles, normal respiratory effort, on room air CV: RRR w/o M/G/R GI: abd soft, NTND, NABS, no R/G/M MSK: no peripheral edema, muscle strength globally intact 5/5 bilateral upper/lower extremities NEURO: CN II-XII intact, no focal deficits, sensation to light touch intact PSYCH: normal mood/affect Integumentary: dry/intact, no rashes or wounds    The results of significant diagnostics from this hospitalization (including imaging, microbiology, ancillary and laboratory) are listed below for reference.     Microbiology: No results found for this or any previous visit (from the past 240 hour(s)).   Labs: BNP (last 3 results) No results for input(s): "BNP" in the last 8760 hours. Basic Metabolic Panel: Recent Labs  Lab 07/17/22 0643 07/18/22 0445 07/19/22 0952 07/20/22 0643  NA 133* 133* 131* 132*  K 4.2 4.0  3.5 3.5  CL 101 101 101 99  CO2 '26 25 26 25  '$ GLUCOSE 149* 100* 123* 89  BUN '16 18 13 9  '$ CREATININE 0.72 0.75 0.63 0.65  CALCIUM 8.7* 7.8* 7.5* 7.6*   Liver Function Tests: Recent Labs  Lab 07/17/22 0643 07/18/22 0445 07/19/22 0952 07/20/22 0643  AST 38 32 30 37  ALT '15 13 12 13  '$ ALKPHOS 77 60 66 74  BILITOT 2.5* 2.1* 1.8* 1.9*  PROT 9.0* 7.3 7.1 6.8  ALBUMIN 3.4* 2.8* 2.6* 2.5*   Recent Labs  Lab 07/17/22 0643 07/18/22 0445  LIPASE 1,458* 414*   No results for input(s): "AMMONIA" in the last 168 hours. CBC: Recent Labs  Lab 07/17/22 0643 07/18/22 0445 07/19/22 0952 07/20/22 0643  WBC 10.1 11.0* 8.6 8.0  NEUTROABS 6.6  --  5.2 4.2  HGB 15.9 13.5 12.5* 12.1*  HCT 46.8 39.1 36.5* 34.8*  MCV 97.5 97.3 97.3 97.2  PLT 145*  142* 144* 156   Cardiac Enzymes: No results for input(s): "CKTOTAL", "CKMB", "CKMBINDEX", "TROPONINI" in the last 168 hours. BNP: Invalid input(s): "POCBNP" CBG: Recent Labs  Lab 07/19/22 0741 07/20/22 0756  GLUCAP 113* 97   D-Dimer No results for input(s): "DDIMER" in the last 72 hours. Hgb A1c No results for input(s): "HGBA1C" in the last 72 hours. Lipid Profile No results for input(s): "CHOL", "HDL", "LDLCALC", "TRIG", "CHOLHDL", "LDLDIRECT" in the last 72 hours. Thyroid function studies No results for input(s): "TSH", "T4TOTAL", "T3FREE", "THYROIDAB" in the last 72 hours.  Invalid input(s): "FREET3" Anemia work up No results for input(s): "VITAMINB12", "FOLATE", "FERRITIN", "TIBC", "IRON", "RETICCTPCT" in the last 72 hours. Urinalysis    Component Value Date/Time   COLORURINE AMBER (A) 07/17/2022 0853   APPEARANCEUR CLEAR (A) 07/17/2022 0853   LABSPEC 1.028 07/17/2022 0853   PHURINE 5.0 07/17/2022 0853   GLUCOSEU NEGATIVE 07/17/2022 0853   HGBUR NEGATIVE 07/17/2022 0853   BILIRUBINUR NEGATIVE 07/17/2022 0853   KETONESUR NEGATIVE 07/17/2022 0853   PROTEINUR NEGATIVE 07/17/2022 0853   NITRITE NEGATIVE 07/17/2022 0853   LEUKOCYTESUR TRACE (A) 07/17/2022 0853   Sepsis Labs Recent Labs  Lab 07/17/22 0643 07/18/22 0445 07/19/22 0952 07/20/22 0643  WBC 10.1 11.0* 8.6 8.0   Microbiology No results found for this or any previous visit (from the past 240 hour(s)).   Time coordinating discharge: Over 30 minutes  SIGNED:   Donnamarie Poag [place] DO  [facility] 07/20/2022, 3:19 PM

## 2022-07-20 NOTE — Discharge Instructions (Signed)
                  Intensive Outpatient Programs  [facility]    [facility] [address]     [address] [place],  [place]     [place], [place] [phone]      [phone]  [facility]   [facility]  (Inpatient and outpatient)  [phone] (Suboxone and Methadone) [address]           [phone]           [facility]    [facility] - Intensive Outpatient [address]     [address] [place], [place] [address]     [place], [place]  [phone]      [phone]  [facility] (Outpatient, Inpatient, Chemical  [facility] (Groups and Residental) (insurance only) [phone]    [place], [place]          [phone]       [facility]    [facility] (for caregivers and family) [address]     [address] [place], [place]     [place], [place] [phone]      [phone]  Residential Treatment Programs  [facility]  [facility](2 years) Residential: 90 days)  [facility] [address]      [address] [place], [place]     [place], [place] [phone]      [phone] or [phone]  [facility]    [facility] [address]      [address] [place], [place]     [place], [place] [phone]      [phone]  [facility]   [facility] ([facility] [address]     [address] [place], [place] [address]     [place], [place] [phone]      [phone] Admissions: 8am-3pm M-F  [facility]: Residential Program (90 Days)              [facility]: [facility]  [place],      [place],   [phone] or [phone]    (Walk in Hours over the weekend or by referral)   Mobil Crisis: [facility]:[phone] (for crisis  response 24 hours a day) 

## 2022-07-20 NOTE — Evaluation (Signed)
Physical Therapy Evaluation Patient Details Name: Luke Rogers. MRN: [id number] DOB: [birth year] Today's Date: 07/20/2022  History of Present Illness  Luke Rogers. is a 61 y.o. male with medical history significant of alcohol abuse, alcoholic hepatitis, hepatitis C, tobacco abuse, meningitis, GERD, who presents with abdominal pain.     Patient said that he has abdominal pain for more than 2 days, which is located in the left upper quadrant, constant, sharp, severe, radiating to the left shoulder.  Clinical Impression  Patient received ambulating in room to bathroom. He reports B LE pain and abdominal pain. States he was given wrong medicine and that is what is causing leg pain that started last night. Then tells me he has bakers cyst and other mass behind knees that may be why he can't walk well. I did not palpate any obvious masses. Patient attempted to use walker to bathroom with mod improved safety and less knee buckling noted. Patient reports RW isn't helping much. Patient encouraged to use RW for mobility or call nursing as needed to remain safe in room. He will continue to benefit from skilled PT follow up to ensure safety with mobility for return home.           Recommendations for follow up therapy are one component of a multi-disciplinary discharge planning process, led by the attending physician.  Recommendations may be updated based on patient status, additional functional criteria and insurance authorization.  Follow Up Recommendations No PT follow up      Assistance Recommended at Discharge Intermittent Supervision/Assistance  Patient can return home with the following  Help with stairs or ramp for entrance;Assist for transportation;A little help with walking and/or transfers;A little help with bathing/dressing/bathroom    Equipment Recommendations Other (comment) (TBD)  Recommendations for Other Services       Functional Status Assessment Patient has had a  recent decline in their functional status and demonstrates the ability to make significant improvements in function in a reasonable and predictable amount of time.     Precautions / Restrictions Precautions Precautions: Fall Restrictions Weight Bearing Restrictions: No      Mobility  Bed Mobility Overal bed mobility: Needs Assistance Bed Mobility: Sit to Supine       Sit to supine: Min guard   General bed mobility comments: difficulty getting legs up onto bed.    Transfers Overall transfer level: Needs assistance Equipment used: Rolling walker (2 wheels), None Transfers: Sit to/from Stand Sit to Stand: Supervision           General transfer comment: supervision    Ambulation/Gait Ambulation/Gait assistance: Supervision Gait Distance (Feet): 40 Feet Assistive device: Rolling walker (2 wheels), None Gait Pattern/deviations: Step-through pattern, Decreased step length - right, Decreased step length - left, Trunk flexed, Staggering right, Staggering left, Antalgic Gait velocity: decr     General Gait Details: patient limping during gait, unsteady, painful. Used RW for ambulation to bathroom but didnt feel like it was helping much. (however less limping noted when using walker)  Stairs            Wheelchair Mobility    Modified Rankin (Stroke Patients Only)       Balance Overall balance assessment: Needs assistance Sitting-balance support: Feet supported Sitting balance-Leahy Scale: Good     Standing balance support: No upper extremity supported, During functional activity, Bilateral upper extremity supported Standing balance-Leahy Scale: Fair Standing balance comment: increased fall risk due to pain in legs and difficulty walking  Pertinent Vitals/Pain Pain Assessment Pain Assessment: Faces Faces Pain Scale: Hurts little more Pain Location: B thighs and abdomen Pain Descriptors / Indicators: Discomfort,  Grimacing, Guarding Pain Intervention(s): Monitored during session    Home Living Family/patient expects to be discharged to:: Private residence Living Arrangements: Spouse/significant other;Other relatives (patient states he and his wife live in his parents basement apartment and help take care of his parents. His wife is currently in [place].)   Type of Home: House Home Access: Level entry     Alternate Level Stairs-Number of Steps: flight of stairs where pt's parents live Home Layout: Two level;Able to live on main level with bedroom/bathroom Home Equipment: Rolling Walker (2 wheels);Cane - single point;Shower seat;Wheelchair - manual      Prior Function Prior Level of Function : Independent/Modified Independent;Driving             Mobility Comments: reports he does not use AD ADLs Comments: independent at baseline     Hand Dominance   Dominant Hand: Right    Extremity/Trunk Assessment   Upper Extremity Assessment Upper Extremity Assessment: Overall WFL for tasks assessed    Lower Extremity Assessment Lower Extremity Assessment: RLE deficits/detail;LLE deficits/detail RLE Coordination: decreased gross motor LLE Coordination: decreased gross motor    Cervical / Trunk Assessment Cervical / Trunk Assessment: Normal  Communication   Communication: No difficulties  Cognition Arousal/Alertness: Awake/alert Behavior During Therapy: WFL for tasks assessed/performed Overall Cognitive Status: Within Functional Limits for tasks assessed                                          General Comments      Exercises     Assessment/Plan    PT Assessment Patient needs continued PT services  PT Problem List Decreased activity tolerance;Decreased balance;Decreased mobility;Decreased safety awareness;Pain       PT Treatment Interventions DME instruction;Therapeutic exercise;Gait training;Balance training;Stair training;Functional mobility  training;Therapeutic activities;Patient/family education    PT Goals (Current goals can be found in the Care Plan section)  Acute Rehab PT Goals Patient Stated Goal: to get better and go home PT Goal Formulation: With patient Time For Goal Achievement: 07/27/22 Potential to Achieve Goals: Good    Frequency Min 2X/week     Co-evaluation               AM-PAC PT "6 Clicks" Mobility  Outcome Measure Help needed turning from your back to your side while in a flat bed without using bedrails?: A Little Help needed moving from lying on your back to sitting on the side of a flat bed without using bedrails?: A Little Help needed moving to and from a bed to a chair (including a wheelchair)?: A Little Help needed standing up from a chair using your arms (e.g., wheelchair or bedside chair)?: A Little Help needed to walk in hospital room?: A Little Help needed climbing 3-5 steps with a railing? : A Lot 6 Click Score: 17    End of Session   Activity Tolerance: Patient limited by pain Patient left: in bed;with call bell/phone within reach Nurse Communication: Mobility status PT Visit Diagnosis: Unsteadiness on feet (R26.81);Other abnormalities of gait and mobility (R26.89);Difficulty in walking, not elsewhere classified (R26.2);Pain Pain - Right/Left:  (bilateral) Pain - part of body: Leg    Time: 1353-1403 PT Time Calculation (min) (ACUTE ONLY): 10 min   Charges:   PT Evaluation $PT  Eval Moderate Complexity: 1 Mod          Adriella Essex, PT, GCS 07/20/22,2:21 PM

## 2022-07-20 NOTE — Evaluation (Signed)
Occupational Therapy Evaluation Patient Details Name: Luke Rogers. MRN: [id number] DOB: 1960/10/17 Today's Date: 07/20/2022   History of Present Illness Luke Rogers. is a 61 y.o. male with medical history significant of alcohol abuse, alcoholic hepatitis, hepatitis C, tobacco abuse, meningitis, GERD, who presents with abdominal pain.     Patient said that he has abdominal pain for more than 2 days, which is located in the left upper quadrant, constant, sharp, severe, radiating to the left shoulder.   Clinical Impression   Luke Rogers was seen for OT evaluation this date. Prior to hospital admission, pt was IND. Pt lives with spouse and parents, cares for his parents. Pt presents to acute OT demonstrating impaired ADL performance and functional mobility 2/2 decreased activity tolerance. Upon arrival pt standing in room, reports wanting to d/c (charge RN notified). SUPERVISION for mobility, furniture walking noted with BUE tremors. Educated on falls prevention and importance of using RW for safety upon d/c. MOD I for LC access in sitting. No skilled acute OT needs, will sign off. Upon hospital discharge, recommend no OT follow up.    Recommendations for follow up therapy are one component of a multi-disciplinary discharge planning process, led by the attending physician.  Recommendations may be updated based on patient status, additional functional criteria and insurance authorization.   Follow Up Recommendations  No OT follow up     Assistance Recommended at Discharge Set up Supervision/Assistance  Patient can return home with the following A little help with walking and/or transfers;A little help with bathing/dressing/bathroom    Functional Status Assessment  Patient has had a recent decline in their functional status and demonstrates the ability to make significant improvements in function in a reasonable and predictable amount of time.  Equipment Recommendations  None  recommended by OT    Recommendations for Other Services       Precautions / Restrictions Precautions Precautions: Fall Restrictions Weight Bearing Restrictions: No      Mobility Bed Mobility               General bed mobility comments: NT    Transfers Overall transfer level: Needs assistance Equipment used: None Transfers: Sit to/from Stand Sit to Stand: Supervision           General transfer comment: pt defers use of RW, reaching out for support on furniture      Balance Overall balance assessment: Needs assistance Sitting-balance support: Feet supported Sitting balance-Leahy Scale: Normal     Standing balance support: No upper extremity supported, During functional activity Standing balance-Leahy Scale: Fair                             ADL either performed or assessed with clinical judgement   ADL Overall ADL's : Needs assistance/impaired                                       General ADL Comments: SUPERVISION toilet t/f. MOD I for LB access in sitting      Pertinent Vitals/Pain Pain Assessment Pain Assessment: Faces Faces Pain Scale: Hurts even more Pain Location: B thighs and abdomen Pain Descriptors / Indicators: Discomfort, Grimacing, Guarding Pain Intervention(s): Limited activity within patient's tolerance, Repositioned     Hand Dominance Right   Extremity/Trunk Assessment Upper Extremity Assessment Upper Extremity Assessment: Overall WFL for tasks assessed  Lower Extremity Assessment Lower Extremity Assessment: Generalized weakness RLE Coordination: decreased gross motor LLE Coordination: decreased gross motor   Cervical / Trunk Assessment Cervical / Trunk Assessment: Normal   Communication Communication Communication: No difficulties   Cognition Arousal/Alertness: Awake/alert Behavior During Therapy: WFL for tasks assessed/performed Overall Cognitive Status: Within Functional Limits for tasks  assessed                                        Home Living Family/patient expects to be discharged to:: Private residence Living Arrangements: Spouse/significant other;Other relatives (patient states he and his wife live in his parents basement apartment and help take care of his parents. His wife is currently in [place].)   Type of Home: House Home Access: Level entry     Home Layout: Two level;Able to live on main level with bedroom/bathroom Alternate Level Stairs-Number of Steps: flight of stairs where pt's parents live   Bathroom Shower/Tub: Walk-in shower         Home Equipment: Conservation officer, nature (2 wheels);Cane - single point;Shower seat;Wheelchair - manual          Prior Functioning/Environment Prior Level of Function : Independent/Modified Independent;Driving             Mobility Comments: reports he does not use AD ADLs Comments: independent at baseline        OT Problem List: Decreased strength;Decreased activity tolerance;Impaired balance (sitting and/or standing)         OT Goals(Current goals can be found in the care plan section) Acute Rehab OT Goals Patient Stated Goal: to go home OT Goal Formulation: With patient Time For Goal Achievement: 08/03/22 Potential to Achieve Goals: Good   AM-PAC OT "6 Clicks" Daily Activity     Outcome Measure Help from another person eating meals?: None Help from another person taking care of personal grooming?: None Help from another person toileting, which includes using toliet, bedpan, or urinal?: A Little Help from another person bathing (including washing, rinsing, drying)?: A Little Help from another person to put on and taking off regular upper body clothing?: None Help from another person to put on and taking off regular lower body clothing?: None 6 Click Score: 22   End of Session Nurse Communication: Other (comment) (pt requesting to leave AMA)  Activity Tolerance: Patient tolerated  treatment well Patient left: in bed;with call bell/phone within reach;with bed alarm set  OT Visit Diagnosis: Other abnormalities of gait and mobility (R26.89);Muscle weakness (generalized) (M62.81)                Time: 0938-1829 OT Time Calculation (min): 11 min Charges:  OT General Charges $OT Visit: 1 Visit OT Evaluation $OT Eval Low Complexity: 1 Low  Dessie Coma, M.S. OTR/L  07/20/22, 3:21 PM  ascom [phone]

## 2022-07-20 NOTE — Progress Notes (Addendum)
  Transition of Care [facility]) Screening Note   Patient Details  Name: Luke Rogers. Date of Birth: [birth year]   Transition of Care [facility]) CM/SW Contact:    Tiburcio Bash, LCSW Phone Number: 07/20/2022, 3:27 PM  SA resources on AVS  Transition of Care Department [facility]) has reviewed patient and no TOC needs have been identified at this time. We will continue to monitor patient advancement through interdisciplinary progression rounds. If new patient transition needs arise, please place a TOC consult.  Kelby Fam, [place], MSW, [place]

## 2022-07-20 NOTE — Plan of Care (Signed)
  Problem: Education: Goal: Knowledge of General Education information will improve Description Including pain rating scale, medication(s)/side effects and non-pharmacologic comfort measures Outcome: Progressing   

## 2022-07-21 ENCOUNTER — Telehealth: Payer: Self-pay | Admitting: *Deleted

## 2022-07-21 ENCOUNTER — Encounter: Payer: Self-pay | Admitting: *Deleted

## 2022-07-21 NOTE — Progress Notes (Unsigned)
  Care Coordination  Note  07/21/2022 Name: Luke Rogers. MRN: [id number] DOB: Oct 27, 1960  Luke Rogers. is a 61 y.o. year old primary care patient of Olin Hauser, DO. I reached out to [employer]. by phone today to assist with scheduling a follow up appointment. Luke Rogers. verbally consented to my assistance.       Follow up plan: Unsuccessful telephone outreach attempt made. A HIPAA compliant phone message was left for the patient providing contact information and requesting a return call.    Julian Hy, [place] Direct Dial: [phone]

## 2022-07-21 NOTE — Patient Outreach (Signed)
  Care Coordination [facility] Note Transition Care Management Follow-up Telephone Call Date of discharge and from where: [facility] [phone] How have you been since you were released from the hospital? Doing good Any questions or concerns? No  Items Reviewed: Did the pt receive and understand the discharge instructions provided? Yes  Medications obtained and verified? Yes  Other? No  Any new allergies since your discharge? No  Dietary orders reviewed? No Do you have support at home? Yes   Home Care and Equipment/Supplies: Were home health services ordered? not applicable If so, what is the name of the agency? N/a  Has the agency set up a time to come to the patient's home? not applicable Were any new equipment or medical supplies ordered?  No What is the name of the medical supply agency? N/a Were you able to get the supplies/equipment? not applicable Do you have any questions related to the use of the equipment or supplies? No  Functional Questionnaire: (I = Independent and D = Dependent) ADLs: I  Bathing/Dressing- I  Meal Prep- I  Eating- I  Maintaining continence- I  Transferring/Ambulation- I  Managing Meds- I  Follow up appointments reviewed:  PCP Hospital f/u appt confirmed? n. [facility] f/u appt confirmed? No . Are transportation arrangements needed? No  If their condition worsens, is the pt aware to call PCP or go to the Emergency Dept.? Yes Was the patient provided with contact information for the PCP's office or ED? Yes Was to pt encouraged to call back with questions or concerns? Yes  SDOH assessments and interventions completed:   Yes SDOH Interventions Today    Flowsheet Row Most Recent Value  SDOH Interventions   Food Insecurity Interventions Intervention Not Indicated  Housing Interventions Intervention Not Indicated  Transportation Interventions Intervention Not Indicated       Care Coordination Interventions:  PCP follow up appointment  requested RN had new PCP added    Encounter Outcome:  Pt. Visit Completed    [facility] [phone]

## 2022-07-26 NOTE — Progress Notes (Unsigned)
  Care Coordination  Note  07/26/2022 Name: [name]. MRN: [id number] DOB: Sep 06, 1960  Luke Rogers. is a 62 y.o. year old primary care patient of Olin Hauser, DO. I reached out to [employer]. by phone today to assist with scheduling a follow up appointment. Luke Rogers. verbally consented to my assistance.       Follow up plan: 2nd Unsuccessful telephone outreach attempt made. A HIPAA compliant phone message was left for the patient providing contact information and requesting a return call.   Julian Hy, [place] Direct Dial: [phone]

## 2022-07-27 NOTE — Progress Notes (Signed)
  Care Coordination  Note  07/27/2022 Name: Jaelen Soth. MRN: [id number] DOB: [birth year]  [name]. is a 62 y.o. year old primary care patient of Olin Hauser, DO. I reached out to [employer]. by phone today to assist with scheduling a follow up appointment. [name]. verbally consented to my assistance.       Follow up plan: We have been unable to make or maintain contact with the patient for follow up.   Julian Hy, [place] Direct Dial: [phone]

## 2022-08-22 ENCOUNTER — Emergency Department
Admission: EM | Admit: 2022-08-22 | Discharge: 2022-08-22 | Disposition: A | Discharge status: Home / Self Care | Payer: 59 | Attending: Emergency Medicine | Admitting: Emergency Medicine

## 2022-08-22 ENCOUNTER — Other Ambulatory Visit: Payer: Self-pay

## 2022-08-22 ENCOUNTER — Emergency Department: Payer: 59

## 2022-08-22 ENCOUNTER — Ambulatory Visit: Payer: Self-pay | Admitting: Family Medicine

## 2022-08-22 ENCOUNTER — Encounter: Payer: Self-pay | Admitting: Emergency Medicine

## 2022-08-22 DIAGNOSIS — M25512 Pain in left shoulder: Secondary | ICD-10-CM | POA: Diagnosis not present

## 2022-08-22 DIAGNOSIS — X58XXXA Exposure to other specified factors, initial encounter: Secondary | ICD-10-CM | POA: Diagnosis not present

## 2022-08-22 DIAGNOSIS — S46012A Strain of muscle(s) and tendon(s) of the rotator cuff of left shoulder, initial encounter: Secondary | ICD-10-CM | POA: Insufficient documentation

## 2022-08-22 DIAGNOSIS — R000 Tachycardia, unspecified: Secondary | ICD-10-CM | POA: Diagnosis not present

## 2022-08-22 DIAGNOSIS — S46912A Strain of unspecified muscle, fascia and tendon at shoulder and upper arm level, left arm, initial encounter: Secondary | ICD-10-CM | POA: Diagnosis not present

## 2022-08-22 DIAGNOSIS — R079 Chest pain, unspecified: Secondary | ICD-10-CM | POA: Diagnosis not present

## 2022-08-22 LAB — CBC
HCT: 43 % (ref 39.0–52.0)
Hemoglobin: 14.6 g/dL (ref 13.0–17.0)
MCH: 32 pg (ref 26.0–34.0)
MCHC: 34 g/dL (ref 30.0–36.0)
MCV: 94.3 fL (ref 80.0–100.0)
Platelets: 142 10*3/uL — ABNORMAL LOW (ref 150–400)
RBC: 4.56 MIL/uL (ref 4.22–5.81)
RDW: 13.6 % (ref 11.5–15.5)
WBC: 9.9 10*3/uL (ref 4.0–10.5)
nRBC: 0 % (ref 0.0–0.2)

## 2022-08-22 LAB — BASIC METABOLIC PANEL
Anion gap: 6 (ref 5–15)
BUN: 10 mg/dL (ref 6–20)
CO2: 23 mmol/L (ref 22–32)
Calcium: 8.2 mg/dL — ABNORMAL LOW (ref 8.9–10.3)
Chloride: 105 mmol/L (ref 98–111)
Creatinine, Ser: 0.69 mg/dL (ref 0.61–1.24)
GFR, Estimated: >60 mL/min (ref >60)
Glucose, Bld: 158 mg/dL — ABNORMAL HIGH (ref 70–99)
Potassium: 3.7 mmol/L (ref 3.5–5.1)
Sodium: 134 mmol/L — ABNORMAL LOW (ref 135–145)

## 2022-08-22 LAB — TROPONIN I (HIGH SENSITIVITY): Troponin I (High Sensitivity): 3 ng/L (ref <18)

## 2022-08-22 MED ORDER — KETOROLAC TROMETHAMINE 30 MG/ML IJ SOLN
30.0000 mg | Freq: Once | INTRAMUSCULAR | Status: AC
  Administered 2022-08-22: 30 mg via INTRAMUSCULAR
  Filled 2022-08-22: qty 1

## 2022-08-22 MED ORDER — METHOCARBAMOL 500 MG PO TABS: 500.0000 mg | ORAL_TABLET | Freq: Three times a day (TID) | ORAL | 1 refills | Status: DC | PRN

## 2022-08-22 MED ORDER — TRAMADOL HCL 50 MG PO TABS: 50.0000 mg | ORAL_TABLET | Freq: Four times a day (QID) | ORAL | 0 refills | Status: DC | PRN

## 2022-08-22 NOTE — ED Provider Notes (Signed)
[facility] Provider Note    Event Date/Time   First MD Initiated Contact with Patient 08/22/22 1042     (approximate)   History   Shoulder Pain   HPI  Luke Rogers. is a 62 y.o. male who presents with complaints of left shoulder pain.  Patient reports he helped a friend move this weekend and overnight developed significant shoulder pain, he reports it is more painful if he tries to lift his shoulder.  No shortness of breath, no chest pain     Physical Exam   Triage Vital Signs: ED Triage Vitals  Enc Vitals Group     BP 08/22/22 1022 (!) 149/87     Pulse Rate 08/22/22 1022 92     Resp 08/22/22 1022 16     Temp 08/22/22 1022 98.3 F (36.8 C)     Temp Source 08/22/22 1022 Oral     SpO2 08/22/22 1022 95 %     Weight 08/22/22 1020 77.1 kg (170 lb)     Height 08/22/22 1020 1.88 m ('6\' 2"'$ )     Head Circumference --      Peak Flow --      Pain Score 08/22/22 1019 10     Pain Loc --      Pain Edu? --      Excl. in [place]? --     Most recent vital signs: Vitals:   08/22/22 1022 08/22/22 1131  BP: (!) 149/87 (!) 140/80  Pulse: 92 90  Resp: 16 16  Temp: 98.3 F (36.8 C)   SpO2: 95% 96%     General: Awake, no distress.  CV:  Good peripheral perfusion.  Resp:  Normal effort.  Abd:  No distention.  Other:  Tenderness palpation along the anterior left shoulder, no bruising or swelling or redness.  Painful passive rotation of the left shoulder   ED Results / Procedures / Treatments   Labs (all labs ordered are listed, but only abnormal results are displayed) Labs Reviewed  BASIC METABOLIC PANEL - Abnormal; Notable for the following components:      Result Value   Sodium 134 (*)    Glucose, Bld 158 (*)    Calcium 8.2 (*)    All other components within normal limits  CBC - Abnormal; Notable for the following components:   Platelets 142 (*)    All other components within normal limits  TROPONIN I (HIGH SENSITIVITY)  TROPONIN I (HIGH  SENSITIVITY)     EKG ED ECG REPORT I, Lavonia Drafts, the attending physician, personally viewed and interpreted this ECG.  Date: 08/22/2022  Rhythm: normal sinus rhythm QRS Axis: normal Intervals: normal ST/T Wave abnormalities: normal Narrative Interpretation: no evidence of acute ischemia     RADIOLOGY Chest x-ray viewed interpreted by me, no acute abnormality    PROCEDURES:  Critical Care performed:   Procedures   MEDICATIONS ORDERED IN ED: Medications  ketorolac (TORADOL) 30 MG/ML injection 30 mg (30 mg Intramuscular Given 08/22/22 1120)     IMPRESSION / MDM / ASSESSMENT AND PLAN / ED COURSE  I reviewed the triage vital signs and the nursing notes. Patient's presentation is most consistent with acute presentation with potential threat to life or bodily function.  Patient presents with left shoulder pain as detailed above, consistent with musculoskeletal injury, likely tendon versus ligamentous injury.  Will give IM Toradol here, patient will need to follow-up with orthopedics, suspect you might need MRI and further evaluation  FINAL CLINICAL IMPRESSION(S) / ED DIAGNOSES   Final diagnoses:  Strain of left shoulder, initial encounter     Rx / DC Orders   ED Discharge Orders          Ordered    traMADol (ULTRAM) 50 MG tablet  Every 6 hours PRN        08/22/22 1111    methocarbamol (ROBAXIN) 500 MG tablet  Every 8 hours PRN        08/22/22 1111             Note:  This document was prepared using Dragon voice recognition software and may include unintentional dictation errors.   Lavonia Drafts, MD 08/22/22 1538

## 2022-08-22 NOTE — ED Triage Notes (Signed)
Pt states that he has been having left shoulder pain for a few weeks that is getting worse and states that he cont to work, states that he can't raise his arm due to pain, pt's wife states that a couple weeks ago he was laying in bed and she thought he was having a stroke, states that he couldn't due the smile, was breathing heavy and was c/o left arm pain, was admitted over [place]

## 2022-09-10 ENCOUNTER — Emergency Department
Admission: EM | Admit: 2022-09-10 | Discharge: 2022-09-10 | Disposition: A | Discharge status: Home / Self Care | Payer: 59 | Attending: Emergency Medicine | Admitting: Emergency Medicine

## 2022-09-10 ENCOUNTER — Encounter: Payer: Self-pay | Admitting: Intensive Care

## 2022-09-10 ENCOUNTER — Emergency Department: Payer: 59

## 2022-09-10 ENCOUNTER — Other Ambulatory Visit: Payer: Self-pay

## 2022-09-10 DIAGNOSIS — K4090 Unilateral inguinal hernia, without obstruction or gangrene, not specified as recurrent: Secondary | ICD-10-CM

## 2022-09-10 DIAGNOSIS — R10813 Right lower quadrant abdominal tenderness: Secondary | ICD-10-CM | POA: Diagnosis present

## 2022-09-10 LAB — CBC WITH DIFFERENTIAL/PLATELET
Abs Immature Granulocytes: 0.02 10*3/uL (ref 0.00–0.07)
Basophils Absolute: 0.1 10*3/uL (ref 0.0–0.1)
Basophils Relative: 1 %
Eosinophils Absolute: 0.2 10*3/uL (ref 0.0–0.5)
Eosinophils Relative: 2 %
HCT: 43.1 % (ref 39.0–52.0)
Hemoglobin: 14.8 g/dL (ref 13.0–17.0)
Immature Granulocytes: 0 %
Lymphocytes Relative: 33 %
Lymphs Abs: 3 10*3/uL (ref 0.7–4.0)
MCH: 32.1 pg (ref 26.0–34.0)
MCHC: 34.3 g/dL (ref 30.0–36.0)
MCV: 93.5 fL (ref 80.0–100.0)
Monocytes Absolute: 0.7 10*3/uL (ref 0.1–1.0)
Monocytes Relative: 8 %
Neutro Abs: 5.2 10*3/uL (ref 1.7–7.7)
Neutrophils Relative %: 56 %
Platelets: 199 10*3/uL (ref 150–400)
RBC: 4.61 MIL/uL (ref 4.22–5.81)
RDW: 13.3 % (ref 11.5–15.5)
WBC: 9.2 10*3/uL (ref 4.0–10.5)
nRBC: 0 % (ref 0.0–0.2)

## 2022-09-10 LAB — URINALYSIS, ROUTINE W REFLEX MICROSCOPIC
Bilirubin Urine: NEGATIVE
Glucose, UA: NEGATIVE mg/dL
Hgb urine dipstick: NEGATIVE
Ketones, ur: NEGATIVE mg/dL
Leukocytes,Ua: NEGATIVE
Nitrite: NEGATIVE
Protein, ur: NEGATIVE mg/dL
Specific Gravity, Urine: 1.029 (ref 1.005–1.030)
pH: 5 (ref 5.0–8.0)

## 2022-09-10 LAB — BASIC METABOLIC PANEL
Anion gap: 9 (ref 5–15)
BUN: 8 mg/dL (ref 6–20)
CO2: 24 mmol/L (ref 22–32)
Calcium: 8.3 mg/dL — ABNORMAL LOW (ref 8.9–10.3)
Chloride: 102 mmol/L (ref 98–111)
Creatinine, Ser: 0.7 mg/dL (ref 0.61–1.24)
GFR, Estimated: >60 mL/min (ref >60)
Glucose, Bld: 166 mg/dL — ABNORMAL HIGH (ref 70–99)
Potassium: 3.6 mmol/L (ref 3.5–5.1)
Sodium: 135 mmol/L (ref 135–145)

## 2022-09-10 MED ORDER — IOHEXOL 300 MG/ML  SOLN
100.0000 mL | Freq: Once | INTRAMUSCULAR | Status: AC | PRN
  Administered 2022-09-10: 100 mL via INTRAVENOUS

## 2022-09-10 MED ORDER — HYDROCODONE-ACETAMINOPHEN 5-325 MG PO TABS: 1.0000 | ORAL_TABLET | Freq: Three times a day (TID) | ORAL | 0 refills | Status: AC | PRN

## 2022-09-10 MED ORDER — ONDANSETRON HCL 4 MG/2ML IJ SOLN
4.0000 mg | Freq: Once | INTRAMUSCULAR | Status: AC
  Administered 2022-09-10: 4 mg via INTRAVENOUS
  Filled 2022-09-10: qty 2

## 2022-09-10 MED ORDER — FENTANYL CITRATE PF 50 MCG/ML IJ SOSY
50.0000 ug | PREFILLED_SYRINGE | Freq: Once | INTRAMUSCULAR | Status: AC
  Administered 2022-09-10: 50 ug via INTRAVENOUS
  Filled 2022-09-10: qty 1

## 2022-09-10 NOTE — Discharge Instructions (Signed)
Your exam, labs, and CT scan show evidence of a moderate hiatal hernia, which is stable.  This is when a portion of the stomach lives above the diaphragm in the chest cavity.  You may experience reflux, indigestion, and GERD.  You also have evidence of a reducible right inguinal hernia.  This particular hernia is due to a defect in the fascia separating your abdomen from your pelvis.  If this hernia should ever become acutely inflamed, tender or you experience fevers, chills, nausea, vomiting, or diarrhea, you should report to the ED immediately.  Otherwise you should schedule with a local general surgeon for surgical consultation.  Take the prescription medicine for pain only as needed.

## 2022-09-10 NOTE — ED Provider Notes (Signed)
[facility] Emergency Department Provider Note     Event Date/Time   First MD Initiated Contact with Patient 09/10/22 1403     (approximate)   History   Groin Pain   HPI  Luke Rogers. is a 62 y.o. male presents to the ED for evaluation of acute and persistent pain to the right inguinal region for the last 2 weeks.  Patient reports onset a few weeks ago when he lifted a bookcase while helping some friends to move their home.  Since that time he has had fullness to the right inguinal region with intermittent pain.  He denies any bladder or bowel changes but does endorse some nausea this week.  He would note that the bulge would often resolve spontaneously when he was lying flat.  He denies any urinary retention, hematuria, or FCS.   Physical Exam   Triage Vital Signs: ED Triage Vitals  Enc Vitals Group     BP 09/10/22 1235 115/79     Pulse Rate 09/10/22 1235 87     Resp 09/10/22 1235 16     Temp 09/10/22 1235 97.8 F (36.6 C)     Temp Source 09/10/22 1235 Oral     SpO2 09/10/22 1235 98 %     Weight 09/10/22 1240 163 lb 12.8 oz (74.3 kg)     Height 09/10/22 1236 6' 2.5" (1.892 m)     Head Circumference --      Peak Flow --      Pain Score 09/10/22 1236 10     Pain Loc --      Pain Edu? --      Excl. in [place]? --     Most recent vital signs: Vitals:   09/10/22 1235 09/10/22 1800  BP: 115/79 132/82  Pulse: 87 83  Resp: 16   Temp: 97.8 F (36.6 C)   SpO2: 98%     General Awake, no distress. NAD CV:  Good peripheral perfusion.  RESP:  Normal effort.  ABD:  No distention.  Nontender.  Normal bowel sounds noted. GU:  Right inguinal region with subtle rounded fullness, tender to palpation.  Patient with palpable inguinal hernia with direct inguinal palpation.   ED Results / Procedures / Treatments   Labs (all labs ordered are listed, but only abnormal results are displayed) Labs Reviewed  BASIC METABOLIC PANEL - Abnormal; Notable  for the following components:      Result Value   Glucose, Bld 166 (*)    Calcium 8.3 (*)    All other components within normal limits  URINALYSIS, ROUTINE W REFLEX MICROSCOPIC - Abnormal; Notable for the following components:   Color, Urine AMBER (*)    APPearance HAZY (*)    All other components within normal limits  CBC WITH DIFFERENTIAL/PLATELET     EKG   RADIOLOGY  I personally viewed and evaluated these images as part of my medical decision making, as well as reviewing the written report by the radiologist.  ED Provider Interpretation: no acute findings. No evidence of RIH. Stable hiatal hernia, pancreatic pseudocyst,   CT ABDOMEN PELVIS W CONTRAST  Result Date: 09/10/2022 CLINICAL DATA:  Hernia suspected EXAM: CT ABDOMEN AND PELVIS WITH CONTRAST TECHNIQUE: Multidetector CT imaging of the abdomen and pelvis was performed using the standard protocol following bolus administration of intravenous contrast. RADIATION DOSE REDUCTION: This exam was performed according to the departmental dose-optimization program which includes automated exposure control, adjustment of the mA and/or  kV according to patient size and/or use of iterative reconstruction technique. CONTRAST:  145m OMNIPAQUE IOHEXOL 300 MG/ML  SOLN COMPARISON:  CT abdomen pelvis, 07/17/2022 MR abdomen, 07/18/2022 FINDINGS: Lower chest: No acute abnormality. Moderate hiatal hernia with intrathoracic position of the gastric fundus. Hepatobiliary: No solid liver abnormality is seen. Mildly coarse contour of the liver. No gallstones, gallbladder wall thickening, or biliary dilatation. Pancreas: Slight interval enlargement of a fluid attenuation cystic lesion of the superior pancreatic head and neck measuring 5.3 x 4.3 cm, previously 4.4 x 3.7 cm (series 2, image 33). No pancreatic ductal dilatation or surrounding inflammatory changes. Spleen: Normal in size without significant abnormality. Adrenals/Urinary Tract: Adrenal glands are  unremarkable. Multiple simple, benign bilateral renal cortical cysts, as well as a hemorrhagic or proteinaceous cyst arising from the superior pole of the left kidney, benign, for which no further follow-up or characterization is required. Kidneys are otherwise normal, without renal calculi, solid lesion, or hydronephrosis. Bladder is unremarkable. Stomach/Bowel: Stomach is within normal limits. Appendix appears normal. No evidence of bowel wall thickening, distention, or inflammatory changes. Sigmoid diverticula. Vascular/Lymphatic: Scattered aortic atherosclerosis. No enlarged abdominal or pelvic lymph nodes. Reproductive: No mass or other significant abnormality. Other: No abdominal wall hernia or abnormality. Small volume ascites throughout the abdomen and pelvis, similar to prior examination. Musculoskeletal: No acute or significant osseous findings. IMPRESSION: 1. No evidence of abdominal wall hernia. 2. Small volume ascites throughout the abdomen and pelvis, similar to prior examination. 3. Slight interval enlargement of a fluid attenuation cystic lesion of the superior pancreatic head and neck measuring 5.3 x 4.3 cm, previously 4.4 x 3.7 cm. 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 4. Moderate hiatal hernia with intrathoracic position of the gastric fundus. 5. Sigmoid diverticulosis without evidence of acute diverticulitis. 6. Mildly coarse contour of the liver suggesting cirrhosis. Aortic Atherosclerosis (ICD10-I70.0). Electronically Signed   By: ADelanna AhmadiM.D.   On: 09/10/2022 17:19     PROCEDURES:  Critical Care performed: No  Procedures   MEDICATIONS ORDERED IN ED: Medications  fentaNYL (SUBLIMAZE) injection 50 mcg (50 mcg Intravenous Given 09/10/22 1623)  ondansetron (ZOFRAN) injection 4 mg (4 mg Intravenous Given 09/10/22 1621)  iohexol (OMNIPAQUE) 300 MG/ML solution 100 mL (100 mLs Intravenous Contrast Given 09/10/22  1703)     IMPRESSION / MDM / ASSESSMENT AND PLAN / ED COURSE  I reviewed the triage vital signs and the nursing notes.                              Differential diagnosis includes, but is not limited to, acute appendicitis, renal colic, testicular torsion, urinary tract infection/pyelonephritis, prostatitis,  epididymitis, diverticulitis, small bowel obstruction or ileus, colitis, abdominal aortic aneurysm, gastroenteritis, hernia, etc.   Patient's presentation is most consistent with acute presentation with potential threat to life or bodily function.   Patient's diagnosis is consistent with inguinal hernia that does not appear incarcerated, strangulated, or gangrenous.  Patient with easily reducible inguinal hernia based on position..  No CT evidence of an acute right inguinal hernia based on my interpretation.  He does have evidence of a stable hiatal hernia and incidental findings of pancreatic pseudocyst.  Patient will be discharged home with prescriptions for Vicodin. Patient is to follow up with general surgery as needed or otherwise directed. Patient is given ED precautions to return to the ED for any worsening or new symptoms.  FINAL CLINICAL IMPRESSION(S) / ED DIAGNOSES   Final diagnoses:  Inguinal hernia of right side without obstruction or gangrene     Rx / DC Orders   ED Discharge Orders          Ordered    HYDROcodone-acetaminophen (NORCO) 5-325 MG tablet  3 times daily PRN        09/10/22 1821             Note:  This document was prepared using Dragon voice recognition software and may include unintentional dictation errors.    Melvenia Needles, PA-C 09/10/22 1837    Blake Divine, MD 09/10/22 [phone]

## 2022-09-10 NOTE — ED Notes (Signed)
Patient declined discharge vital signs. 

## 2022-09-10 NOTE — ED Notes (Signed)
Fiancee at bedside. 

## 2022-09-10 NOTE — ED Triage Notes (Signed)
Patient c/o right groin pain. Reports he has had a mass present going on two weeks. Reports painful. Denies private pain or swelling. Denies trouble urinating.   Reports he has appointment soon with oncologist for rare mass behind left knee

## 2022-09-12 ENCOUNTER — Other Ambulatory Visit: Payer: Self-pay

## 2022-09-12 ENCOUNTER — Emergency Department
Admission: EM | Admit: 2022-09-12 | Discharge: 2022-09-12 | Disposition: A | Discharge status: Home / Self Care | Payer: 59 | Attending: Emergency Medicine | Admitting: Emergency Medicine

## 2022-09-12 DIAGNOSIS — K4090 Unilateral inguinal hernia, without obstruction or gangrene, not specified as recurrent: Secondary | ICD-10-CM | POA: Diagnosis not present

## 2022-09-12 LAB — CBC WITH DIFFERENTIAL/PLATELET
Abs Immature Granulocytes: 0.01 10*3/uL (ref 0.00–0.07)
Basophils Absolute: 0.1 10*3/uL (ref 0.0–0.1)
Basophils Relative: 2 %
Eosinophils Absolute: 0.5 10*3/uL (ref 0.0–0.5)
Eosinophils Relative: 6 %
HCT: 40.8 % (ref 39.0–52.0)
Hemoglobin: 14 g/dL (ref 13.0–17.0)
Immature Granulocytes: 0 %
Lymphocytes Relative: 36 %
Lymphs Abs: 2.6 10*3/uL (ref 0.7–4.0)
MCH: 32 pg (ref 26.0–34.0)
MCHC: 34.3 g/dL (ref 30.0–36.0)
MCV: 93.2 fL (ref 80.0–100.0)
Monocytes Absolute: 0.8 10*3/uL (ref 0.1–1.0)
Monocytes Relative: 11 %
Neutro Abs: 3.3 10*3/uL (ref 1.7–7.7)
Neutrophils Relative %: 45 %
Platelets: 148 10*3/uL — ABNORMAL LOW (ref 150–400)
RBC: 4.38 MIL/uL (ref 4.22–5.81)
RDW: 13.2 % (ref 11.5–15.5)
WBC: 7.3 10*3/uL (ref 4.0–10.5)
nRBC: 0 % (ref 0.0–0.2)

## 2022-09-12 LAB — COMPREHENSIVE METABOLIC PANEL
ALT: 25 U/L (ref 0–44)
AST: 57 U/L — ABNORMAL HIGH (ref 15–41)
Albumin: 3.1 g/dL — ABNORMAL LOW (ref 3.5–5.0)
Alkaline Phosphatase: 87 U/L (ref 38–126)
Anion gap: 9 (ref 5–15)
BUN: 9 mg/dL (ref 6–20)
CO2: 23 mmol/L (ref 22–32)
Calcium: 8.4 mg/dL — ABNORMAL LOW (ref 8.9–10.3)
Chloride: 103 mmol/L (ref 98–111)
Creatinine, Ser: 0.67 mg/dL (ref 0.61–1.24)
GFR, Estimated: >60 mL/min (ref >60)
Glucose, Bld: 125 mg/dL — ABNORMAL HIGH (ref 70–99)
Potassium: 3.9 mmol/L (ref 3.5–5.1)
Sodium: 135 mmol/L (ref 135–145)
Total Bilirubin: 1.1 mg/dL (ref 0.3–1.2)
Total Protein: 8.8 g/dL — ABNORMAL HIGH (ref 6.5–8.1)

## 2022-09-12 LAB — LACTIC ACID, PLASMA
Lactic Acid, Venous: 1.4 mmol/L (ref 0.5–1.9)
Lactic Acid, Venous: 1 mmol/L (ref 0.5–1.9)

## 2022-09-12 MED ORDER — HYDROCODONE-ACETAMINOPHEN 5-325 MG PO TABS: 1.0000 | ORAL_TABLET | Freq: Four times a day (QID) | ORAL | 0 refills | Status: DC | PRN

## 2022-09-12 MED ORDER — FENTANYL CITRATE PF 50 MCG/ML IJ SOSY
50.0000 ug | PREFILLED_SYRINGE | Freq: Once | INTRAMUSCULAR | Status: AC
  Administered 2022-09-12: 50 ug via INTRAVENOUS
  Filled 2022-09-12: qty 1

## 2022-09-12 MED ORDER — MORPHINE SULFATE (PF) 4 MG/ML IV SOLN
4.0000 mg | Freq: Once | INTRAVENOUS | Status: AC
  Administered 2022-09-12: 4 mg via INTRAVENOUS
  Filled 2022-09-12: qty 1

## 2022-09-12 NOTE — ED Provider Notes (Signed)
[facility] Provider Note    Event Date/Time   First MD Initiated Contact with Patient 09/12/22 0945     (approximate)   History   Hernia   HPI  Luke Rogers. is a 62 y.o. male who presents to the emergency department today because of concerns for continued hernia pain.  Patient was seen in the emergency department 2 days ago for concerns for right inguinal hernia.  Has CT scan done at that time without any concerning findings.  He says that whenever he stands up he feels it come out again.  It is quite painful and makes it hard for him to walk.  When he lies down however it goes back.  He denies any difficulty with bowel movements.  States he is still passing gas.  No nausea or vomiting.      Physical Exam   Triage Vital Signs: ED Triage Vitals  Enc Vitals Group     BP 09/12/22 0945 128/85     Pulse Rate 09/12/22 0945 87     Resp 09/12/22 0945 17     Temp 09/12/22 0945 98 F (36.7 C)     Temp Source 09/12/22 0945 Oral     SpO2 09/12/22 0945 98 %     Weight --      Height --      Head Circumference --      Peak Flow --      Pain Score 09/12/22 0941 10     Pain Loc --      Pain Edu? --      Excl. in [place]? --     Most recent vital signs: Vitals:   09/12/22 0945 09/12/22 1000  BP: 128/85 109/78  Pulse: 87 70  Resp: 17 16  Temp: 98 F (36.7 C)   SpO2: 98% 96%   General: Awake, alert, oriented. CV:  Good peripheral perfusion. Regular rate and rhythm. Resp:  Normal effort. Lungs clear. Abd:  No distention. When standing some bulge to right inguinal area, tender. However easily reduced when laying down.    ED Results / Procedures / Treatments   Labs (all labs ordered are listed, but only abnormal results are displayed) Labs Reviewed  CBC WITH DIFFERENTIAL/PLATELET - Abnormal; Notable for the following components:      Result Value   Platelets 148 (*)    All other components within normal limits  COMPREHENSIVE METABOLIC PANEL  - Abnormal; Notable for the following components:   Glucose, Bld 125 (*)    Calcium 8.4 (*)    Total Protein 8.8 (*)    Albumin 3.1 (*)    AST 57 (*)    All other components within normal limits  LACTIC ACID, PLASMA  LACTIC ACID, PLASMA     EKG  None   RADIOLOGY None   PROCEDURES:  Critical Care performed: No  Procedures   MEDICATIONS ORDERED IN ED: Medications  fentaNYL (SUBLIMAZE) injection 50 mcg (50 mcg Intravenous Given 09/12/22 1037)     IMPRESSION / MDM / ASSESSMENT AND PLAN / ED COURSE  I reviewed the triage vital signs and the nursing notes.                              Differential diagnosis includes, but is not limited to, hernia, abscess  Patient's presentation is most consistent with acute presentation with potential threat to life or bodily function.   Patient  presented to the emergency department today with concerns for continued right inguinal hernia.  On exam he does appear to have a hernia upon standing although is easily reduced when lying down.  Patient is afebrile here.  No leukocytosis.  No skin changes.  At this time I have low suspicion for incarceration or strangulation.  Patient had CT performed 2 days ago.  At this time do not feel repeat emergent imaging is necessary.  Will attempt pain control.  Patient has surgery appointment already scheduled for later this week.   FINAL CLINICAL IMPRESSION(S) / ED DIAGNOSES   Final diagnoses:  Unilateral inguinal hernia without obstruction or gangrene, recurrence not specified     Note:  This document was prepared using Dragon voice recognition software and may include unintentional dictation errors.    Nance Pear, MD 09/12/22 [phone]

## 2022-09-12 NOTE — Discharge Instructions (Addendum)
Please seek medical attention for any high fevers, chest pain, shortness of breath, change in behavior, persistent vomiting, bloody stool or any other new or concerning symptoms.  

## 2022-09-12 NOTE — ED Notes (Addendum)
Pt took two hydrocodone and two Tylenol (for h/a) prior to coming but was ineffective for pain. Had two falls last night from pain. States hernia is larger than what it was on Sunday.

## 2022-09-12 NOTE — ED Triage Notes (Signed)
Pt BIB fiance for right hernial pain, pt states seen on Sunday for same. Pt denies fevers or chills. Pt here for pain control. Pt is currently holding R side. Pt assisted out of vehicle on arrival. NAD noted.

## 2022-09-12 NOTE — ED Notes (Signed)
Introduced myself to pt and asked how they were feeling and if they were wanting to try to get their pain more under control before they left. Pt said "laying here is the best it's going to feel, it gets worse when I get up." Fiance stated "last night he went outside to smoke and when he came back in the pain was so bad he couldn't walk and was crawling on the floor and it took me about 10 mins to get him off the floor." MD notified of pt's desire to get pain under better control before they leave and to have pain meds to get them to their follow up appointment on Thursday.

## 2022-09-14 ENCOUNTER — Ambulatory Visit: Payer: 59 | Admitting: Surgery

## 2022-09-18 NOTE — Progress Notes (Addendum)
Patient ID: Luke Rogers., male   DOB: 01-26-1961, 62 y.o.   MRN: [id number]  Chief Complaint: Right groin pain  History of Present Illness Luke Rogers. is a 62 y.o. male with right groin pain progressive over the last month.  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  Initially had some discomfort following a heavy lift, but worked through it due to necessity.  Has noted progressive development of a bulge in the right groin associated with coughing and lifting.  Increasing pain and burning with time along with numbness extending down the right groin.  No known pain in the left groin.  Has pyrosis which she treats with bicarbonate.  Was noted to have a hiatal hernia on CT scan exam, but currently seems to be managing his symptoms well.  Past Medical History Past Medical History:  Diagnosis Date   Alcohol abuse    Hepatitis C    Pancreatitis       Past Surgical History:  Procedure Laterality Date   FOOT SURGERY Bilateral    As infant   WRIST SURGERY Right     No Known Allergies  Current Outpatient Medications  Medication Sig Dispense Refill   HYDROcodone-acetaminophen (NORCO/VICODIN) 5-325 MG tablet Take 1-2 tablets by mouth every 6 (six) hours as needed for moderate pain or severe pain. 30 tablet 0   No current facility-administered medications for this visit.    Family History Family History  Problem Relation Age of Onset   Hypertension Mother       Social History Social History   Tobacco Use   Smoking status: Every Day    Packs/day: 0.50    Types: Cigarettes   Smokeless tobacco: Never  Vaping Use   Vaping Use: Never used  Substance Use Topics   Alcohol use: Yes    Alcohol/week: 63.0 standard drinks of alcohol    Types: 63 Cans of beer per week   Drug use: No        Review of Systems  Constitutional:  Positive for malaise/fatigue and weight loss.  HENT: Negative.    Eyes: Negative.   Respiratory:  Negative.    Cardiovascular: Negative.   Gastrointestinal:  Positive for abdominal pain, blood in stool, heartburn and nausea.  Genitourinary:  Positive for frequency.  Skin: Negative.   Neurological: Negative.   Psychiatric/Behavioral: Negative.       Physical Exam Blood pressure 130/83, pulse 75, temperature 98.3 F (36.8 C), temperature source Oral, height 6' 2.5" (1.892 m), weight 164 lb (74.4 kg), SpO2 96 %. Last Weight  Most recent update: 09/19/2022 10:01 AM    Weight  74.4 kg (164 lb)             CONSTITUTIONAL: Well developed, and nourished, appropriately responsive and aware without distress.   EYES: Sclera non-icteric.   EARS, NOSE, MOUTH AND THROAT:  The oropharynx is clear. Oral mucosa is pink and moist.    Hearing is intact to voice.  NECK: Trachea is midline, and there is no jugular venous distension.  LYMPH NODES:  Lymph nodes in the neck are not appreciated. RESPIRATORY:   CTA. Normal respiratory effort without pathologic use of accessory muscles. CARDIOVASCULAR:  RRR. Well perfused.  GI: The abdomen is  soft, nontender, and nondistended. There were no palpable masses.  I did not appreciate hepatosplenomegaly. GU: Readily appreciable bulge in the right groin, markable tenderness, spontaneously reducing on supine positioning.  No appreciable groin  or bulge in the left.  MUSCULOSKELETAL:  Symmetrical muscle tone appreciated in all four extremities.    SKIN: Skin turgor is normal. No pathologic skin lesions appreciated.  NEUROLOGIC:  Motor and sensation appear grossly normal.  Cranial nerves are grossly without defect. PSYCH:  Alert and oriented to person, place and time. Affect is appropriate for situation.  Data Reviewed I have personally reviewed what is currently available of the patient's imaging, recent labs and medical records.   Labs:     Latest Ref Rng & Units 09/12/2022    9:56 AM 09/10/2022   12:39 PM 08/22/2022   10:22 AM  CBC  WBC 4.0 - 10.5 K/uL  7.3  9.2  9.9   Hemoglobin 13.0 - 17.0 g/dL 14.0  14.8  14.6   Hematocrit 39.0 - 52.0 % 40.8  43.1  43.0   Platelets 150 - 400 K/uL 148  199  142       Latest Ref Rng & Units 09/12/2022    9:56 AM 09/10/2022   12:39 PM 08/22/2022   10:22 AM  CMP  Glucose 70 - 99 mg/dL 125  166  158   BUN 6 - 20 mg/dL '9  8  10   '$ Creatinine 0.61 - 1.24 mg/dL 0.67  0.70  0.69   Sodium 135 - 145 mmol/L 135  135  134   Potassium 3.5 - 5.1 mmol/L 3.9  3.6  3.7   Chloride 98 - 111 mmol/L 103  102  105   CO2 22 - 32 mmol/L '23  24  23   '$ Calcium 8.9 - 10.3 mg/dL 8.4  8.3  8.2   Total Protein 6.5 - 8.1 g/dL 8.8     Total Bilirubin 0.3 - 1.2 mg/dL 1.1     Alkaline Phos 38 - 126 U/L 87     AST 15 - 41 U/L 57     ALT 0 - 44 U/L 25         Imaging: Radiological images reviewed:  CLINICAL DATA:  Hernia suspected   EXAM: CT ABDOMEN AND PELVIS WITH CONTRAST   TECHNIQUE: Multidetector CT imaging of the abdomen and pelvis was performed using the standard protocol following bolus administration of intravenous contrast.   RADIATION DOSE REDUCTION: This exam was performed according to the departmental dose-optimization program which includes automated exposure control, adjustment of the mA and/or kV according to patient size and/or use of iterative reconstruction technique.   CONTRAST:  158m OMNIPAQUE IOHEXOL 300 MG/ML  SOLN   COMPARISON:  CT abdomen pelvis, 07/17/2022 MR abdomen, 07/18/2022   FINDINGS: Lower chest: No acute abnormality. Moderate hiatal hernia with intrathoracic position of the gastric fundus.   Hepatobiliary: No solid liver abnormality is seen. Mildly coarse contour of the liver. No gallstones, gallbladder wall thickening, or biliary dilatation.   Pancreas: Slight interval enlargement of a fluid attenuation cystic lesion of the superior pancreatic head and neck measuring 5.3 x 4.3 cm, previously 4.4 x 3.7 cm (series 2, image 33). No pancreatic ductal dilatation or surrounding  inflammatory changes.   Spleen: Normal in size without significant abnormality.   Adrenals/Urinary Tract: Adrenal glands are unremarkable. Multiple simple, benign bilateral renal cortical cysts, as well as a hemorrhagic or proteinaceous cyst arising from the superior pole of the left kidney, benign, for which no further follow-up or characterization is required. Kidneys are otherwise normal, without renal calculi, solid lesion, or hydronephrosis. Bladder is unremarkable.   Stomach/Bowel: Stomach is within normal limits. Appendix appears normal.  No evidence of bowel wall thickening, distention, or inflammatory changes. Sigmoid diverticula.   Vascular/Lymphatic: Scattered aortic atherosclerosis. No enlarged abdominal or pelvic lymph nodes.   Reproductive: No mass or other significant abnormality.   Other: No abdominal wall hernia or abnormality. Small volume ascites throughout the abdomen and pelvis, similar to prior examination.   Musculoskeletal: No acute or significant osseous findings.   IMPRESSION: 1. No evidence of abdominal wall hernia. 2. Small volume ascites throughout the abdomen and pelvis, similar to prior examination. 3. Slight interval enlargement of a fluid attenuation cystic lesion of the superior pancreatic head and neck measuring 5.3 x 4.3 cm, previously 4.4 x 3.7 cm. 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 4. Moderate hiatal hernia with intrathoracic position of the gastric fundus. 5. Sigmoid diverticulosis without evidence of acute diverticulitis. 6. Mildly coarse contour of the liver suggesting cirrhosis.   Aortic Atherosclerosis (ICD10-I70.0).     Electronically Signed   By: Delanna Ahmadi M.D.   On: 09/10/2022 17:19 Within last 24 hrs: No results found.  Assessment    Right inguinal hernia, disabling considering his work. Patient Active Problem List   Diagnosis Date Noted    Recurrent acute pancreatitis 07/17/2022   Pancreatic mass 07/17/2022   Left renal mass 07/17/2022   Hypomagnesemia 12/22/2021   GERD (gastroesophageal reflux disease) 12/22/2021   Visual hallucination    Accidental drug overdose    Hyponatremia [id number]   Metabolic acidosis [id number]   Hyperbilirubinemia 12/18/2021   Visual hallucinations 12/18/2021   Amphetamine intoxication ([place]) 12/18/2021   Amphetamine adverse reaction 12/18/2021   Alcohol abuse    Elevated LFTs    Thrombocytopenia (HCC)    Tobacco abuse    Acute alcoholic pancreatitis [id number]   Nicotine dependence 01/17/2021   Transaminitis 01/17/2021   Malnutrition of moderate degree 01/09/2018   Acute purulent meningitis 01/08/2018   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  I discussed possibility of incarceration, strangulation, enlargement in size over time, and the need for emergency surgery in the face of these.  Also reviewed the techniques of reduction should incarceration occur, and when unsuccessful to present to the ED.  Also discussed that surgery risks include recurrence which can be up to 30% in the case of complex hernias, use of prosthetic materials (mesh) and the increased risk of infection and the possible need for re-operation and removal of mesh, possibility of post-op SBO or ileus, and the risks of general anesthetic including heart attack, stroke, sudden death or some reaction to anesthetic medications. The patient, and those present, appear to understand the risks, any and all questions were answered to the patient's satisfaction.  No guarantees were ever expressed or implied.   Face-to-face time spent with the patient and accompanying care providers(if present) was 30 minutes, with more than 50% of the time spent counseling, educating, and coordinating care of the patient.    These notes generated with voice recognition  software. I apologize for typographical errors.  Ronny Bacon M.D., FACS 09/19/2022, 10:31 AM

## 2022-09-18 NOTE — H&P (View-Only) (Signed)
Patient ID: Luke Pillion., male   DOB: 1961-05-15, 62 y.o.   MRN: [id number]  Chief Complaint: Right groin pain  History of Present Illness Luke Rogers. is a 62 y.o. male with right groin pain progressive over the last month.  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  Initially had some discomfort following a heavy lift, but worked through it due to necessity.  Has noted progressive development of a bulge in the right groin associated with coughing and lifting.  Increasing pain and burning with time along with numbness extending down the right groin.  No known pain in the left groin.  Has pyrosis which she treats with bicarbonate.  Was noted to have a hiatal hernia on CT scan exam, but currently seems to be managing his symptoms well.  Past Medical History Past Medical History:  Diagnosis Date   Alcohol abuse    Hepatitis C    Pancreatitis       Past Surgical History:  Procedure Laterality Date   FOOT SURGERY Bilateral    As infant   WRIST SURGERY Right     No Known Allergies  Current Outpatient Medications  Medication Sig Dispense Refill   HYDROcodone-acetaminophen (NORCO/VICODIN) 5-325 MG tablet Take 1-2 tablets by mouth every 6 (six) hours as needed for moderate pain or severe pain. 30 tablet 0   No current facility-administered medications for this visit.    Family History Family History  Problem Relation Age of Onset   Hypertension Mother       Social History Social History   Tobacco Use   Smoking status: Every Day    Packs/day: 0.50    Types: Cigarettes   Smokeless tobacco: Never  Vaping Use   Vaping Use: Never used  Substance Use Topics   Alcohol use: Yes    Alcohol/week: 63.0 standard drinks of alcohol    Types: 63 Cans of beer per week   Drug use: No        Review of Systems  Constitutional:  Positive for malaise/fatigue and weight loss.  HENT: Negative.    Eyes: Negative.   Respiratory:  Negative.    Cardiovascular: Negative.   Gastrointestinal:  Positive for abdominal pain, blood in stool, heartburn and nausea.  Genitourinary:  Positive for frequency.  Skin: Negative.   Neurological: Negative.   Psychiatric/Behavioral: Negative.       Physical Exam Blood pressure 130/83, pulse 75, temperature 98.3 F (36.8 C), temperature source Oral, height 6' 2.5" (1.892 m), weight 164 lb (74.4 kg), SpO2 96 %. Last Weight  Most recent update: 09/19/2022 10:01 AM    Weight  74.4 kg (164 lb)             CONSTITUTIONAL: Well developed, and nourished, appropriately responsive and aware without distress.   EYES: Sclera non-icteric.   EARS, NOSE, MOUTH AND THROAT:  The oropharynx is clear. Oral mucosa is pink and moist.    Hearing is intact to voice.  NECK: Trachea is midline, and there is no jugular venous distension.  LYMPH NODES:  Lymph nodes in the neck are not appreciated. RESPIRATORY:   Normal respiratory effort without pathologic use of accessory muscles. CARDIOVASCULAR:  Well perfused.  GI: The abdomen is  soft, nontender, and nondistended. There were no palpable masses.  I did not appreciate hepatosplenomegaly. GU: Readily appreciable bulge in the right groin, markable tenderness, spontaneously reducing on supine positioning.  No appreciable groin or bulge  in the left.  MUSCULOSKELETAL:  Symmetrical muscle tone appreciated in all four extremities.    SKIN: Skin turgor is normal. No pathologic skin lesions appreciated.  NEUROLOGIC:  Motor and sensation appear grossly normal.  Cranial nerves are grossly without defect. PSYCH:  Alert and oriented to person, place and time. Affect is appropriate for situation.  Data Reviewed I have personally reviewed what is currently available of the patient's imaging, recent labs and medical records.   Labs:     Latest Ref Rng & Units 09/12/2022    9:56 AM 09/10/2022   12:39 PM 08/22/2022   10:22 AM  CBC  WBC 4.0 - 10.5 K/uL 7.3  9.2   9.9   Hemoglobin 13.0 - 17.0 g/dL 14.0  14.8  14.6   Hematocrit 39.0 - 52.0 % 40.8  43.1  43.0   Platelets 150 - 400 K/uL 148  199  142       Latest Ref Rng & Units 09/12/2022    9:56 AM 09/10/2022   12:39 PM 08/22/2022   10:22 AM  CMP  Glucose 70 - 99 mg/dL 125  166  158   BUN 6 - 20 mg/dL '9  8  10   '$ Creatinine 0.61 - 1.24 mg/dL 0.67  0.70  0.69   Sodium 135 - 145 mmol/L 135  135  134   Potassium 3.5 - 5.1 mmol/L 3.9  3.6  3.7   Chloride 98 - 111 mmol/L 103  102  105   CO2 22 - 32 mmol/L '23  24  23   '$ Calcium 8.9 - 10.3 mg/dL 8.4  8.3  8.2   Total Protein 6.5 - 8.1 g/dL 8.8     Total Bilirubin 0.3 - 1.2 mg/dL 1.1     Alkaline Phos 38 - 126 U/L 87     AST 15 - 41 U/L 57     ALT 0 - 44 U/L 25         Imaging: Radiological images reviewed:  CLINICAL DATA:  Hernia suspected   EXAM: CT ABDOMEN AND PELVIS WITH CONTRAST   TECHNIQUE: Multidetector CT imaging of the abdomen and pelvis was performed using the standard protocol following bolus administration of intravenous contrast.   RADIATION DOSE REDUCTION: This exam was performed according to the departmental dose-optimization program which includes automated exposure control, adjustment of the mA and/or kV according to patient size and/or use of iterative reconstruction technique.   CONTRAST:  180m OMNIPAQUE IOHEXOL 300 MG/ML  SOLN   COMPARISON:  CT abdomen pelvis, 07/17/2022 MR abdomen, 07/18/2022   FINDINGS: Lower chest: No acute abnormality. Moderate hiatal hernia with intrathoracic position of the gastric fundus.   Hepatobiliary: No solid liver abnormality is seen. Mildly coarse contour of the liver. No gallstones, gallbladder wall thickening, or biliary dilatation.   Pancreas: Slight interval enlargement of a fluid attenuation cystic lesion of the superior pancreatic head and neck measuring 5.3 x 4.3 cm, previously 4.4 x 3.7 cm (series 2, image 33). No pancreatic ductal dilatation or surrounding inflammatory  changes.   Spleen: Normal in size without significant abnormality.   Adrenals/Urinary Tract: Adrenal glands are unremarkable. Multiple simple, benign bilateral renal cortical cysts, as well as a hemorrhagic or proteinaceous cyst arising from the superior pole of the left kidney, benign, for which no further follow-up or characterization is required. Kidneys are otherwise normal, without renal calculi, solid lesion, or hydronephrosis. Bladder is unremarkable.   Stomach/Bowel: Stomach is within normal limits. Appendix appears normal. No evidence  of bowel wall thickening, distention, or inflammatory changes. Sigmoid diverticula.   Vascular/Lymphatic: Scattered aortic atherosclerosis. No enlarged abdominal or pelvic lymph nodes.   Reproductive: No mass or other significant abnormality.   Other: No abdominal wall hernia or abnormality. Small volume ascites throughout the abdomen and pelvis, similar to prior examination.   Musculoskeletal: No acute or significant osseous findings.   IMPRESSION: 1. No evidence of abdominal wall hernia. 2. Small volume ascites throughout the abdomen and pelvis, similar to prior examination. 3. Slight interval enlargement of a fluid attenuation cystic lesion of the superior pancreatic head and neck measuring 5.3 x 4.3 cm, previously 4.4 x 3.7 cm. 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 4. Moderate hiatal hernia with intrathoracic position of the gastric fundus. 5. Sigmoid diverticulosis without evidence of acute diverticulitis. 6. Mildly coarse contour of the liver suggesting cirrhosis.   Aortic Atherosclerosis (ICD10-I70.0).     Electronically Signed   By: Delanna Ahmadi M.D.   On: 09/10/2022 17:19 Within last 24 hrs: No results found.  Assessment    Right inguinal hernia, disabling considering his work. Patient Active Problem List   Diagnosis Date Noted   Recurrent acute  pancreatitis 07/17/2022   Pancreatic mass 07/17/2022   Left renal mass 07/17/2022   Hypomagnesemia 12/22/2021   GERD (gastroesophageal reflux disease) 12/22/2021   Visual hallucination    Accidental drug overdose    Hyponatremia [id number]   Metabolic acidosis [id number]   Hyperbilirubinemia 12/18/2021   Visual hallucinations 12/18/2021   Amphetamine intoxication ([place]) 12/18/2021   Amphetamine adverse reaction 12/18/2021   Alcohol abuse    Elevated LFTs    Thrombocytopenia ([facility])    Tobacco abuse    Acute alcoholic pancreatitis [id number]   Nicotine dependence 01/17/2021   Transaminitis 01/17/2021   Malnutrition of moderate degree 01/09/2018   Acute purulent meningitis 01/08/2018   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  I discussed possibility of incarceration, strangulation, enlargement in size over time, and the need for emergency surgery in the face of these.  Also reviewed the techniques of reduction should incarceration occur, and when unsuccessful to present to the ED.  Also discussed that surgery risks include recurrence which can be up to 30% in the case of complex hernias, use of prosthetic materials (mesh) and the increased risk of infection and the possible need for re-operation and removal of mesh, possibility of post-op SBO or ileus, and the risks of general anesthetic including heart attack, stroke, sudden death or some reaction to anesthetic medications. The patient, and those present, appear to understand the risks, any and all questions were answered to the patient's satisfaction.  No guarantees were ever expressed or implied.   Face-to-face time spent with the patient and accompanying care providers(if present) was 30 minutes, with more than 50% of the time spent counseling, educating, and coordinating care of the patient.    These notes generated with voice recognition software. I  apologize for typographical errors.  Ronny Bacon M.D., FACS 09/19/2022, 10:31 AM

## 2022-09-19 ENCOUNTER — Encounter: Payer: Self-pay | Admitting: Surgery

## 2022-09-19 ENCOUNTER — Ambulatory Visit (INDEPENDENT_AMBULATORY_CARE_PROVIDER_SITE_OTHER): Payer: 59 | Admitting: Surgery

## 2022-09-19 ENCOUNTER — Other Ambulatory Visit: Payer: Self-pay | Admitting: Surgery

## 2022-09-19 ENCOUNTER — Telehealth: Payer: Self-pay | Admitting: Surgery

## 2022-09-19 ENCOUNTER — Other Ambulatory Visit: Payer: Self-pay

## 2022-09-19 ENCOUNTER — Ambulatory Visit: Payer: Self-pay | Admitting: Surgery

## 2022-09-19 VITALS — BP 130/83 | HR 75 | Temp 98.3°F | Ht 74.5 in | Wt 164.0 lb

## 2022-09-19 DIAGNOSIS — K4090 Unilateral inguinal hernia, without obstruction or gangrene, not specified as recurrent: Secondary | ICD-10-CM | POA: Diagnosis not present

## 2022-09-19 DIAGNOSIS — K4020 Bilateral inguinal hernia, without obstruction or gangrene, not specified as recurrent: Secondary | ICD-10-CM | POA: Insufficient documentation

## 2022-09-19 MED ORDER — TRAMADOL HCL 50 MG PO TABS: 50.0000 mg | ORAL_TABLET | Freq: Four times a day (QID) | ORAL | 0 refills | Status: DC | PRN

## 2022-09-19 MED ORDER — HYDROCODONE-ACETAMINOPHEN 5-325 MG PO TABS: 1.0000 | ORAL_TABLET | Freq: Four times a day (QID) | ORAL | 0 refills | Status: DC | PRN

## 2022-09-19 NOTE — Telephone Encounter (Signed)
Patient was at the pharmacy to get his pain medication, Hydrocodone with vicodin.  His insurance will not cover unless prior approval has been obtained from our office.  Please call the patient, not sure if there can be an alternative medication?  Thank you.

## 2022-09-19 NOTE — Patient Instructions (Addendum)
Pick up your medication at the pharmacy.   Our surgery scheduler will call you within 24-48 hours to schedule your surgery. Please have the [facility] surgery sheet available when speaking with her.  Inguinal Hernia, Adult An inguinal hernia develops when fat or the intestines push through a weak spot in a muscle where the leg meets the lower abdomen (groin). This creates a bulge. This kind of hernia could also be: In the scrotum, if you are male. In folds of skin around the vagina, if you are male. There are three types of inguinal hernias: Hernias that can be pushed back into the abdomen (are reducible). This type rarely causes pain. Hernias that are not reducible (are incarcerated). Hernias that are not reducible and lose their blood supply (are strangulated). This type of hernia requires emergency surgery. What are the causes? This condition is caused by having a weak spot in the muscles or tissues in your groin. This develops over time. The hernia may poke through the weak spot when you suddenly strain your lower abdominal muscles, such as when you: Lift a heavy object. Strain to have a bowel movement. Constipation can lead to straining. Cough. What increases the risk? This condition is more likely to develop in: Males. Pregnant females. People who: Are overweight. Work in jobs that require long periods of standing or heavy lifting. Have had an inguinal hernia before. Smoke or have lung disease. These factors can lead to long-term (chronic) coughing. What are the signs or symptoms? Symptoms may depend on the size of the hernia. Often, a small inguinal hernia has no symptoms. Symptoms of a larger hernia may include: A bulge in the groin area. This is easier to see when standing. It might not be visible when lying down. Pain or burning in the groin. This may get worse when lifting, straining, or coughing. A dull ache or a feeling of pressure in the groin. An unusual bulge in the  scrotum, in males. Symptoms of a strangulated inguinal hernia may include: A bulge in your groin that is very painful and tender to the touch. A bulge that turns red or purple. Fever, nausea, and vomiting. Inability to have a bowel movement or to pass gas. How is this diagnosed? This condition is diagnosed based on your symptoms, your medical history, and a physical exam. Your health care provider may feel your groin area and ask you to cough. How is this treated? Treatment depends on the size of your hernia and whether you have symptoms. If you do not have symptoms, your health care provider may have you watch your hernia carefully and have you come in for follow-up visits. If your hernia is large or if you have symptoms, you may need surgery to repair the hernia. Follow these instructions at home: Lifestyle Avoid lifting heavy objects. Avoid standing for long periods of time. Do not use any products that contain nicotine or tobacco. These products include cigarettes, chewing tobacco, and vaping devices, such as e-cigarettes. If you need help quitting, ask your health care provider. Maintain a healthy weight. Preventing constipation You may need to take these actions to prevent or treat constipation: Drink enough fluid to keep your urine pale yellow. Take over-the-counter or prescription medicines. Eat foods that are high in fiber, such as beans, whole grains, and fresh fruits and vegetables. Limit foods that are high in fat and processed sugars, such as fried or sweet foods. General instructions You may try to push the hernia back in place by  very gently pressing on it while lying down. Do not try to force the bulge back in if it will not push in easily. Watch your hernia for any changes in shape, size, or color. Get help right away if you notice any changes. Take over-the-counter and prescription medicines only as told by your health care provider. Keep all follow-up visits. This is  important. Contact a health care provider if: You have a fever or chills. You develop new symptoms. Your symptoms get worse. Get help right away if: You have pain in your groin that suddenly gets worse. You have a bulge in your groin that: Suddenly gets bigger and does not get smaller. Becomes red or purple or painful to the touch. You are a man and you have a sudden pain in your scrotum, or the size of your scrotum suddenly changes. You cannot push the hernia back in place by very gently pressing on it when you are lying down. You have nausea or vomiting that does not go away. You have a fast heartbeat. You cannot have a bowel movement or pass gas. These symptoms may represent a serious problem that is an emergency. Do not wait to see if the symptoms will go away. Get medical help right away. Call your local emergency services (911 in the [place] Summary An inguinal hernia develops when fat or the intestines push through a weak spot in a muscle where your leg meets your lower abdomen (groin). This condition is caused by having a weak spot in muscles or tissues in your groin. Symptoms may depend on the size of the hernia, and they may include pain or swelling in your groin. A small inguinal hernia often has no symptoms. Treatment may not be needed if you do not have symptoms. If you have symptoms or a large hernia, you may need surgery to repair the hernia. Avoid lifting heavy objects. Also, avoid standing for long periods of time. This information is not intended to replace advice given to you by your health care provider. Make sure you discuss any questions you have with your health care provider. Document Revised: 03/09/2020 Document Reviewed: 03/09/2020 Elsevier Patient Education  [place]. Hiatal Hernia  A hiatal hernia occurs when part of the stomach slides above the muscle that separates the abdomen from the chest (diaphragm). A person can be born with a hiatal hernia  (congenital), or it may develop over time. In almost all cases of hiatal hernia, only the top part of the stomach pushes through the diaphragm. Many people have a hiatal hernia with no symptoms. The larger the hernia, the more likely it is that you will have symptoms. In some cases, a hiatal hernia allows stomach acid to flow back into the tube that carries food from your mouth to your stomach (esophagus). This may cause heartburn symptoms. The development of heartburn symptoms may mean that you have a condition called gastroesophageal reflux disease (GERD). What are the causes? This condition is caused by a weakness in the opening (hiatus) where the esophagus passes through the diaphragm to attach to the upper part of the stomach. A person may be born with a weakness in the hiatus, or a weakness can develop over time. What increases the risk? This condition is more likely to develop in: Older people. Age is a major risk factor for a hiatal hernia, especially if you are over the age of 29. Pregnant women. People who are overweight. People who have frequent constipation. What are the  signs or symptoms? Symptoms of this condition usually develop in the form of GERD symptoms. Symptoms include: Heartburn. Upset stomach (indigestion). Trouble swallowing. Coughing or wheezing. Wheezing is making high-pitched whistling sounds when you breathe. Sore throat. Chest pain. Nausea and vomiting. How is this diagnosed? This condition may be diagnosed during testing for GERD. Tests that may be done include: X-rays of your stomach or chest. An upper gastrointestinal (GI) series. This is an X-ray exam of your GI tract that is taken after you swallow a chalky liquid that shows up clearly on the X-ray. Endoscopy. This is a procedure to look into your stomach using a thin, flexible tube that has a tiny camera and light on the end of it. How is this treated? This condition may be treated by: Dietary and  lifestyle changes to help reduce GERD symptoms. Medicines. These may include: Over-the-counter antacids. Medicines that make your stomach empty more quickly. Medicines that block the production of stomach acid (H2 blockers). Stronger medicines to reduce stomach acid (proton pump inhibitors). Surgery to repair the hernia, if other treatments are not helping. If you have no symptoms, you may not need treatment. Follow these instructions at home: Lifestyle and activity Do not use any products that contain nicotine or tobacco. These products include cigarettes, chewing tobacco, and vaping devices, such as e-cigarettes. If you need help quitting, ask your health care provider. Try to achieve and maintain a healthy body weight. Avoid putting pressure on your abdomen. Anything that puts pressure on your abdomen increases the amount of acid that may be pushed up into your esophagus. Avoid bending over, especially after eating. Raise the head of your bed by putting blocks under the legs. This keeps your head and esophagus higher than your stomach. Do not wear tight clothing around your chest or stomach. Try not to strain when having a bowel movement, when urinating, or when lifting heavy objects. Eating and drinking Avoid foods that can worsen GERD symptoms. These may include: Fatty foods, like fried foods. Citrus fruits, like oranges or lemon. Other foods and drinks that contain acid, like orange juice or tomatoes. Spicy food. Chocolate. Eat frequent small meals instead of three large meals a day. This helps prevent your stomach from getting too full. Eat slowly. Do not lie down right after eating. Do not eat 1-2 hours before bed. Do not drink beverages with caffeine. These include cola, coffee, cocoa, and tea. Do not drink alcohol. General instructions Take over-the-counter and prescription medicines only as told by your health care provider. Keep all follow-up visits. Your health care  provider will want to check that any new prescribed medicines are helping your symptoms. Contact a health care provider if: Your symptoms are not controlled with medicines or lifestyle changes. You are having trouble swallowing. You have coughing or wheezing that will not go away. Your pain is getting worse. Your pain spreads to your arms, neck, jaw, teeth, or back. You feel nauseous or you vomit. Get help right away if: You have shortness of breath. You vomit blood. You have bright red blood in your stools. You have black, tarry stools. These symptoms may be an emergency. Get help right away. Call 911. Do not wait to see if the symptoms will go away. Do not drive yourself to the hospital. Summary A hiatal hernia occurs when part of the stomach slides above the muscle that separates the abdomen from the chest. A person may be born with a weakness in the hiatus, or a weakness  can develop over time. Symptoms of a hiatal hernia may include heartburn, trouble swallowing, or sore throat. Management of a hiatal hernia includes eating frequent small meals instead of three large meals a day. Get help right away if you vomit blood, have bright red blood in your stools, or have black, tarry stools. This information is not intended to replace advice given to you by your health care provider. Make sure you discuss any questions you have with your health care provider. Document Revised: 09/06/2021 Document Reviewed: 09/06/2021 Elsevier Patient Education  [place].

## 2022-09-19 NOTE — Progress Notes (Signed)
Vicodin not covered by ins.  Different Rx for tramadol Rx'd.

## 2022-09-19 NOTE — Telephone Encounter (Signed)
Patient has been advised of Pre-Admission date/time, and Surgery date at [facility].  Surgery Date: 09/25/22 Preadmission Testing Date: 09/22/22 (phone 8a-1p)  Patient has been made aware to call [phone], between 1-3:00pm the day before surgery, to find out what time to arrive for surgery.

## 2022-09-22 ENCOUNTER — Encounter: Payer: Self-pay | Admitting: Urgent Care

## 2022-09-22 ENCOUNTER — Encounter
Admission: RE | Admit: 2022-09-22 | Discharge: 2022-09-22 | Disposition: A | Discharge status: Home / Self Care | Payer: 59 | Source: Ambulatory Visit

## 2022-09-22 ENCOUNTER — Encounter
Admission: RE | Admit: 2022-09-22 | Discharge: 2022-09-22 | Disposition: A | Discharge status: Home / Self Care | Payer: 59 | Source: Ambulatory Visit | Attending: Surgery | Admitting: Surgery

## 2022-09-22 ENCOUNTER — Other Ambulatory Visit: Payer: Self-pay

## 2022-09-22 DIAGNOSIS — F15929 Other stimulant use, unspecified with intoxication, unspecified: Secondary | ICD-10-CM | POA: Insufficient documentation

## 2022-09-22 DIAGNOSIS — F1911 Other psychoactive substance abuse, in remission: Secondary | ICD-10-CM | POA: Diagnosis not present

## 2022-09-22 DIAGNOSIS — Z01812 Encounter for preprocedural laboratory examination: Secondary | ICD-10-CM

## 2022-09-22 DIAGNOSIS — T50901A Poisoning by unspecified drugs, medicaments and biological substances, accidental (unintentional), initial encounter: Secondary | ICD-10-CM

## 2022-09-22 HISTORY — DX: Gastro-esophageal reflux disease without esophagitis: K21.9

## 2022-09-22 HISTORY — DX: Personal history of other specified conditions: Z87.898

## 2022-09-22 HISTORY — DX: Personal history of other diseases of the digestive system: Z87.19

## 2022-09-22 LAB — URINE DRUG SCREEN, QUALITATIVE (ARMC ONLY)
Amphetamines, Ur Screen: NOT DETECTED
Barbiturates, Ur Screen: NOT DETECTED
Benzodiazepine, Ur Scrn: NOT DETECTED
Cannabinoid 50 Ng, Ur ~~LOC~~: NOT DETECTED
Cocaine Metabolite,Ur ~~LOC~~: NOT DETECTED
MDMA (Ecstasy)Ur Screen: NOT DETECTED
Methadone Scn, Ur: NOT DETECTED
Opiate, Ur Screen: NOT DETECTED
Phencyclidine (PCP) Ur S: NOT DETECTED
Tricyclic, Ur Screen: NOT DETECTED

## 2022-09-22 NOTE — Patient Instructions (Signed)
Your procedure is scheduled on: Monday 09/25/22 To find out your arrival time, please call [phone] between 1PM - 3PM on:   Friday 09/22/22 Report to the Registration Desk on the 1st floor of the [facility]. Valet parking is available.  If your arrival time is 6:00 am, do not arrive before that time as the [facility] entrance doors do not open until 6:00 am.  REMEMBER: Instructions that are not followed completely may result in serious medical risk, up to and including death; or upon the discretion of your surgeon and anesthesiologist your surgery may need to be rescheduled.  Do not eat food or drink any liquids after midnight the night before surgery.  No gum chewing or hard candies.  One week prior to surgery: Stop Anti-inflammatories (NSAIDS) such as Advil, Aleve, Ibuprofen, Motrin, Naproxen, Naprosyn and Aspirin based products such as Excedrin, Goody's Powder, BC Powder. You may however, continue to take Tylenol if needed for pain up until the day of surgery.  Stop ANY OVER THE COUNTER supplements until after surgery.  Continue taking all prescribed medications with the exception of the following: N/A  TAKE ONLY THESE MEDICATIONS THE MORNING OF SURGERY WITH A SIP OF WATER:  none  No Alcohol for 24 hours before or after surgery.  No Smoking including e-cigarettes for 24 hours before surgery.  No chewable tobacco products for at least 6 hours before surgery.  No nicotine patches on the day of surgery.  Do not use any "recreational" drugs for at least a week (preferably 2 weeks) before your surgery.  Please be advised that the combination of cocaine and anesthesia may have negative outcomes, up to and including death. If you test positive for cocaine, your surgery will be cancelled.  On the morning of surgery brush your teeth with toothpaste and water, you may rinse your mouth with mouthwash if you wish. Do not swallow any toothpaste or mouthwash.  Use CHG Soap or wipes  as directed on instruction sheet.  Do not wear lotions, powders, or perfumes. You may use deodorant.  Do not shave body hair from the neck down 48 hours before surgery.  Wear comfortable clothing (specific to your surgery type) to the hospital.  Do not wear jewelry, make-up, hairpins, clips or nail polish.  Contact lenses, hearing aids and dentures may not be worn into surgery.  Do not bring valuables to the hospital. [facility] is not responsible for any missing/lost belongings or valuables.   Notify your doctor if there is any change in your medical condition (cold, fever, infection).  If you are being discharged the day of surgery, you will not be allowed to drive home. You will need a responsible individual to drive you home and stay with you for 24 hours after surgery.   If you are taking public transportation, you will need to have a responsible individual with you.  If you are being admitted to the hospital overnight, leave your suitcase in the car. After surgery it may be brought to your room.  In case of increased patient census, it may be necessary for you, the patient, to continue your postoperative care in the Same Day Surgery department.  After surgery, you can help prevent lung complications by doing breathing exercises.  Take deep breaths and cough every 1-2 hours. Your doctor may order a device called an Incentive Spirometer to help you take deep breaths. When coughing or sneezing, hold a pillow firmly against your incision with both hands. This is  called "splinting." Doing this helps protect your incision. It also decreases belly discomfort.  Surgery Visitation Policy:  Patients undergoing a surgery or procedure may have two family members or support persons with them as long as the person is not COVID-19 positive or experiencing its symptoms.   Inpatient Visitation:    Visiting hours are 7 a.m. to 8 p.m. Up to four visitors are allowed at one time in a patient  room. The visitors may rotate out with other people during the day. One designated support person (adult) may remain overnight.  Due to an increase in RSV and influenza rates and associated hospitalizations, children ages 92 and under will not be able to visit patients in [facility]. Masks continue to be strongly recommended.  Please call the [facility] Dept. at [phone] if you have any questions about these instructions.    Preparing for Surgery with CHLORHEXIDINE GLUCONATE (CHG) Soap  Chlorhexidine Gluconate (CHG) Soap  o An antiseptic cleaner that kills germs and bonds with the skin to continue killing germs even after washing  o Used for showering the night before surgery and morning of surgery  Before surgery, you can play an important role by reducing the number of germs on your skin.  CHG (Chlorhexidine gluconate) soap is an antiseptic cleanser which kills germs and bonds with the skin to continue killing germs even after washing.  Please do not use if you have an allergy to CHG or antibacterial soaps. If your skin becomes reddened/irritated stop using the CHG.  1. Shower the NIGHT BEFORE SURGERY and the MORNING OF SURGERY with CHG soap.  2. If you choose to wash your hair, wash your hair first as usual with your normal shampoo.  3. After shampooing, rinse your hair and body thoroughly to remove the shampoo.  4. Use CHG as you would any other liquid soap. You can apply CHG directly to the skin and wash gently with a scrungie or a clean washcloth.  5. Apply the CHG soap to your body only from the neck down. Do not use on open wounds or open sores. Avoid contact with your eyes, ears, mouth, and genitals (private parts). Wash face and genitals (private parts) with your normal soap.  6. Wash thoroughly, paying special attention to the area where your surgery will be performed.  7. Thoroughly rinse your body with warm water.  8. Do not shower/wash  with your normal soap after using and rinsing off the CHG soap.  9. Pat yourself dry with a clean towel.  10. Wear clean pajamas to bed the night before surgery.  12. Place clean sheets on your bed the night of your first shower and do not sleep with pets.  13. Shower again with the CHG soap on the day of surgery prior to arriving at the hospital.  14. Do not apply any deodorants/lotions/powders.  15. Please wear clean clothes to the hospital.

## 2022-09-24 MED ORDER — CHLORHEXIDINE GLUCONATE CLOTH 2 % EX PADS: 6.0000 | MEDICATED_PAD | Freq: Once | CUTANEOUS | Status: DC

## 2022-09-24 MED ORDER — CELECOXIB 200 MG PO CAPS: 200.0000 mg | ORAL_CAPSULE | ORAL | Status: AC

## 2022-09-24 MED ORDER — BUPIVACAINE LIPOSOME 1.3 % IJ SUSP: 20.0000 mL | Freq: Once | INTRAMUSCULAR | Status: DC

## 2022-09-24 MED ORDER — ACETAMINOPHEN 500 MG PO TABS: 1000.0000 mg | ORAL_TABLET | ORAL | Status: AC

## 2022-09-24 MED ORDER — LACTATED RINGERS IV SOLN: INTRAVENOUS | Status: DC

## 2022-09-24 MED ORDER — ORAL CARE MOUTH RINSE: 15.0000 mL | Freq: Once | OROMUCOSAL | Status: AC

## 2022-09-24 MED ORDER — FAMOTIDINE 20 MG PO TABS: 20.0000 mg | ORAL_TABLET | Freq: Once | ORAL | Status: AC

## 2022-09-24 MED ORDER — CEFAZOLIN SODIUM-DEXTROSE 2-4 GM/100ML-% IV SOLN
2.0000 g | INTRAVENOUS | Status: AC
  Administered 2022-09-25: 2 g via INTRAVENOUS

## 2022-09-24 MED ORDER — GABAPENTIN 300 MG PO CAPS: 300.0000 mg | ORAL_CAPSULE | ORAL | Status: AC

## 2022-09-24 MED ORDER — CHLORHEXIDINE GLUCONATE 0.12 % MT SOLN: 15.0000 mL | Freq: Once | OROMUCOSAL | Status: AC

## 2022-09-25 ENCOUNTER — Other Ambulatory Visit: Payer: Self-pay

## 2022-09-25 ENCOUNTER — Ambulatory Visit
Admission: RE | Admit: 2022-09-25 | Discharge: 2022-09-25 | Disposition: A | Discharge status: Home / Self Care | Payer: Self-pay | Source: Ambulatory Visit | Attending: Surgery | Admitting: Surgery

## 2022-09-25 ENCOUNTER — Encounter: Payer: Self-pay | Admitting: Surgery

## 2022-09-25 ENCOUNTER — Encounter
Admission: RE | Disposition: A | Discharge status: Home / Self Care | Payer: Self-pay | Source: Ambulatory Visit | Attending: Surgery

## 2022-09-25 ENCOUNTER — Ambulatory Visit: Payer: Self-pay | Admitting: Urgent Care

## 2022-09-25 DIAGNOSIS — K4020 Bilateral inguinal hernia, without obstruction or gangrene, not specified as recurrent: Secondary | ICD-10-CM | POA: Insufficient documentation

## 2022-09-25 DIAGNOSIS — T50901A Poisoning by unspecified drugs, medicaments and biological substances, accidental (unintentional), initial encounter: Secondary | ICD-10-CM

## 2022-09-25 DIAGNOSIS — Z01812 Encounter for preprocedural laboratory examination: Secondary | ICD-10-CM

## 2022-09-25 DIAGNOSIS — K4090 Unilateral inguinal hernia, without obstruction or gangrene, not specified as recurrent: Secondary | ICD-10-CM

## 2022-09-25 DIAGNOSIS — F1911 Other psychoactive substance abuse, in remission: Secondary | ICD-10-CM

## 2022-09-25 DIAGNOSIS — F15929 Other stimulant use, unspecified with intoxication, unspecified: Secondary | ICD-10-CM

## 2022-09-25 DIAGNOSIS — B1920 Unspecified viral hepatitis C without hepatic coma: Secondary | ICD-10-CM | POA: Insufficient documentation

## 2022-09-25 DIAGNOSIS — F17210 Nicotine dependence, cigarettes, uncomplicated: Secondary | ICD-10-CM | POA: Insufficient documentation

## 2022-09-25 HISTORY — PX: INSERTION OF MESH: SHX5868

## 2022-09-25 LAB — URINE DRUG SCREEN, QUALITATIVE (ARMC ONLY)
Amphetamines, Ur Screen: NOT DETECTED
Barbiturates, Ur Screen: NOT DETECTED
Benzodiazepine, Ur Scrn: NOT DETECTED
Cannabinoid 50 Ng, Ur ~~LOC~~: NOT DETECTED
Cocaine Metabolite,Ur ~~LOC~~: NOT DETECTED
MDMA (Ecstasy)Ur Screen: NOT DETECTED
Methadone Scn, Ur: NOT DETECTED
Opiate, Ur Screen: NOT DETECTED
Phencyclidine (PCP) Ur S: NOT DETECTED
Tricyclic, Ur Screen: NOT DETECTED

## 2022-09-25 SURGERY — HERNIORRHAPHY, INGUINAL, ROBOT-ASSISTED, LAPAROSCOPIC
Anesthesia: General | Site: Inguinal | Laterality: Bilateral

## 2022-09-25 MED ORDER — ROCURONIUM BROMIDE 10 MG/ML (PF) SYRINGE
PREFILLED_SYRINGE | INTRAVENOUS | Status: AC
  Filled 2022-09-25: qty 10

## 2022-09-25 MED ORDER — PROPOFOL 10 MG/ML IV BOLUS
INTRAVENOUS | Status: AC
  Filled 2022-09-25: qty 20

## 2022-09-25 MED ORDER — BUPIVACAINE LIPOSOME 1.3 % IJ SUSP
INTRAMUSCULAR | Status: AC
  Filled 2022-09-25: qty 20

## 2022-09-25 MED ORDER — SODIUM CHLORIDE 0.9 % IR SOLN
Status: DC | PRN
  Administered 2022-09-25: 3000 mL

## 2022-09-25 MED ORDER — EPINEPHRINE PF 1 MG/ML IJ SOLN
INTRAMUSCULAR | Status: AC
  Filled 2022-09-25: qty 1

## 2022-09-25 MED ORDER — MIDAZOLAM HCL 2 MG/2ML IJ SOLN
INTRAMUSCULAR | Status: DC | PRN
  Administered 2022-09-25: 2 mg via INTRAVENOUS

## 2022-09-25 MED ORDER — ONDANSETRON HCL 4 MG/2ML IJ SOLN
INTRAMUSCULAR | Status: DC | PRN
  Administered 2022-09-25: 4 mg via INTRAVENOUS

## 2022-09-25 MED ORDER — ROCURONIUM BROMIDE 100 MG/10ML IV SOLN
INTRAVENOUS | Status: DC | PRN
  Administered 2022-09-25: 10 mg via INTRAVENOUS
  Administered 2022-09-25: 50 mg via INTRAVENOUS
  Administered 2022-09-25: 20 mg via INTRAVENOUS

## 2022-09-25 MED ORDER — BUPIVACAINE HCL (PF) 0.25 % IJ SOLN
INTRAMUSCULAR | Status: AC
  Filled 2022-09-25: qty 30

## 2022-09-25 MED ORDER — DEXAMETHASONE SODIUM PHOSPHATE 10 MG/ML IJ SOLN
INTRAMUSCULAR | Status: AC
  Filled 2022-09-25: qty 1

## 2022-09-25 MED ORDER — LIDOCAINE HCL (CARDIAC) PF 100 MG/5ML IV SOSY
PREFILLED_SYRINGE | INTRAVENOUS | Status: DC | PRN
  Administered 2022-09-25: 80 mg via INTRAVENOUS

## 2022-09-25 MED ORDER — BUPIVACAINE-EPINEPHRINE (PF) 0.25% -1:200000 IJ SOLN
INTRAMUSCULAR | Status: DC | PRN
  Administered 2022-09-25: 16 mL

## 2022-09-25 MED ORDER — OXYCODONE HCL 5 MG PO TABS
ORAL_TABLET | ORAL | Status: AC
  Filled 2022-09-25: qty 1

## 2022-09-25 MED ORDER — HYDROCODONE-ACETAMINOPHEN 5-325 MG PO TABS: 1.0000 | ORAL_TABLET | Freq: Four times a day (QID) | ORAL | 0 refills | Status: DC | PRN

## 2022-09-25 MED ORDER — IBUPROFEN 800 MG PO TABS: 800.0000 mg | ORAL_TABLET | Freq: Three times a day (TID) | ORAL | 0 refills | Status: DC | PRN

## 2022-09-25 MED ORDER — PROMETHAZINE HCL 25 MG/ML IJ SOLN: 6.2500 mg | INTRAMUSCULAR | Status: DC | PRN

## 2022-09-25 MED ORDER — ACETAMINOPHEN 500 MG PO TABS
ORAL_TABLET | ORAL | Status: AC
  Administered 2022-09-25: 1000 mg via ORAL
  Filled 2022-09-25: qty 2

## 2022-09-25 MED ORDER — LIDOCAINE HCL (PF) 2 % IJ SOLN
INTRAMUSCULAR | Status: AC
  Filled 2022-09-25: qty 5

## 2022-09-25 MED ORDER — CELECOXIB 200 MG PO CAPS
ORAL_CAPSULE | ORAL | Status: AC
Start: 2022-09-25 — End: 2022-09-25
  Administered 2022-09-25: 200 mg via ORAL
  Filled 2022-09-25: qty 1

## 2022-09-25 MED ORDER — HYDROMORPHONE HCL 1 MG/ML IJ SOLN
INTRAMUSCULAR | Status: DC | PRN
  Administered 2022-09-25 (×2): .5 mg via INTRAVENOUS

## 2022-09-25 MED ORDER — FENTANYL CITRATE (PF) 100 MCG/2ML IJ SOLN
INTRAMUSCULAR | Status: DC | PRN
  Administered 2022-09-25: 100 ug via INTRAVENOUS

## 2022-09-25 MED ORDER — CHLORHEXIDINE GLUCONATE 0.12 % MT SOLN
OROMUCOSAL | Status: AC
  Administered 2022-09-25: 15 mL via OROMUCOSAL
  Filled 2022-09-25: qty 15

## 2022-09-25 MED ORDER — GABAPENTIN 300 MG PO CAPS
ORAL_CAPSULE | ORAL | Status: AC
  Administered 2022-09-25: 300 mg via ORAL
  Filled 2022-09-25: qty 1

## 2022-09-25 MED ORDER — MIDAZOLAM HCL 2 MG/2ML IJ SOLN
INTRAMUSCULAR | Status: AC
  Filled 2022-09-25: qty 2

## 2022-09-25 MED ORDER — DEXAMETHASONE SODIUM PHOSPHATE 10 MG/ML IJ SOLN
INTRAMUSCULAR | Status: DC | PRN
  Administered 2022-09-25: 10 mg via INTRAVENOUS

## 2022-09-25 MED ORDER — CEFAZOLIN SODIUM-DEXTROSE 2-4 GM/100ML-% IV SOLN
INTRAVENOUS | Status: AC
  Filled 2022-09-25: qty 100

## 2022-09-25 MED ORDER — SUGAMMADEX SODIUM 200 MG/2ML IV SOLN
INTRAVENOUS | Status: DC | PRN
  Administered 2022-09-25: 154.2 mg via INTRAVENOUS

## 2022-09-25 MED ORDER — FENTANYL CITRATE (PF) 100 MCG/2ML IJ SOLN
INTRAMUSCULAR | Status: AC
  Filled 2022-09-25: qty 2

## 2022-09-25 MED ORDER — 0.9 % SODIUM CHLORIDE (POUR BTL) OPTIME
TOPICAL | Status: DC | PRN
  Administered 2022-09-25: 500 mL

## 2022-09-25 MED ORDER — ONDANSETRON HCL 4 MG/2ML IJ SOLN
INTRAMUSCULAR | Status: AC
  Filled 2022-09-25: qty 2

## 2022-09-25 MED ORDER — OXYCODONE HCL 5 MG PO TABS
5.0000 mg | ORAL_TABLET | Freq: Once | ORAL | Status: AC
  Administered 2022-09-25: 5 mg via ORAL

## 2022-09-25 MED ORDER — FENTANYL CITRATE (PF) 100 MCG/2ML IJ SOLN
25.0000 ug | INTRAMUSCULAR | Status: DC | PRN
  Administered 2022-09-25: 50 ug via INTRAVENOUS

## 2022-09-25 MED ORDER — HYDROMORPHONE HCL 1 MG/ML IJ SOLN
INTRAMUSCULAR | Status: AC
  Filled 2022-09-25: qty 1

## 2022-09-25 MED ORDER — PROPOFOL 10 MG/ML IV BOLUS
INTRAVENOUS | Status: DC | PRN
  Administered 2022-09-25: 140 mg via INTRAVENOUS

## 2022-09-25 MED ORDER — FAMOTIDINE 20 MG PO TABS
ORAL_TABLET | ORAL | Status: AC
  Administered 2022-09-25: 20 mg via ORAL
  Filled 2022-09-25: qty 1

## 2022-09-25 MED ORDER — EPHEDRINE SULFATE (PRESSORS) 50 MG/ML IJ SOLN
INTRAMUSCULAR | Status: DC | PRN
  Administered 2022-09-25: 10 mg via INTRAVENOUS

## 2022-09-25 SURGICAL SUPPLY — 54 items
ADH SKN CLS APL DERMABOND .7 (GAUZE/BANDAGES/DRESSINGS) ×2
BLADE CLIPPER SURG (BLADE) ×2 IMPLANT
COVER TIP SHEARS 8 DVNC (MISCELLANEOUS) ×2 IMPLANT
COVER TIP SHEARS 8MM DA VINCI (MISCELLANEOUS) ×2
COVER WAND RF STERILE (DRAPES) ×2 IMPLANT
DERMABOND ADVANCED .7 DNX12 (GAUZE/BANDAGES/DRESSINGS) ×2 IMPLANT
DRAPE ARM DVNC X/XI (DISPOSABLE) ×6 IMPLANT
DRAPE COLUMN DVNC XI (DISPOSABLE) ×2 IMPLANT
DRAPE DA VINCI XI ARM (DISPOSABLE) ×6
DRAPE DA VINCI XI COLUMN (DISPOSABLE) ×2
DRAPE UTILITY 15X26 TOWEL STRL (DRAPES) ×2 IMPLANT
ELECT REM PT RETURN 9FT ADLT (ELECTROSURGICAL) ×2
ELECTRODE REM PT RTRN 9FT ADLT (ELECTROSURGICAL) ×2 IMPLANT
GLOVE ORTHO TXT STRL SZ7.5 (GLOVE) ×6 IMPLANT
GOWN STRL REUS W/ TWL LRG LVL3 (GOWN DISPOSABLE) ×2 IMPLANT
GOWN STRL REUS W/ TWL XL LVL3 (GOWN DISPOSABLE) ×4 IMPLANT
GOWN STRL REUS W/TWL LRG LVL3 (GOWN DISPOSABLE) ×2
GOWN STRL REUS W/TWL XL LVL3 (GOWN DISPOSABLE) ×4
GRASPER SUT TROCAR 14GX15 (MISCELLANEOUS) IMPLANT
IRRIGATION STRYKERFLOW (MISCELLANEOUS) IMPLANT
IRRIGATOR STRYKERFLOW (MISCELLANEOUS)
IRRIGATOR SUCT 8 DISP DVNC XI (IRRIGATION / IRRIGATOR) IMPLANT
IRRIGATOR SUCTION 8MM XI DISP (IRRIGATION / IRRIGATOR) ×2
IV NS 1000ML (IV SOLUTION)
IV NS 1000ML BAXH (IV SOLUTION) IMPLANT
KIT PINK PAD W/HEAD ARE REST (MISCELLANEOUS) ×2
KIT PINK PAD W/HEAD ARM REST (MISCELLANEOUS) ×2 IMPLANT
LABEL OR SOLS (LABEL) ×2 IMPLANT
MANIFOLD NEPTUNE II (INSTRUMENTS) ×2 IMPLANT
MESH 3DMAX LIGHT 4.1X6.2 LT LR (Mesh General) IMPLANT
MESH 3DMAX LIGHT 4.8X6.7 RT XL (Mesh General) IMPLANT
NDL INSUFFLATION 14GA 120MM (NEEDLE) IMPLANT
NEEDLE HYPO 22GX1.5 SAFETY (NEEDLE) ×2 IMPLANT
NEEDLE INSUFFLATION 14GA 120MM (NEEDLE) IMPLANT
PACK LAP CHOLECYSTECTOMY (MISCELLANEOUS) ×2 IMPLANT
SEAL CANN UNIV 5-8 DVNC XI (MISCELLANEOUS) ×6 IMPLANT
SEAL XI 5MM-8MM UNIVERSAL (MISCELLANEOUS) ×6
SET TUBE SMOKE EVAC HIGH FLOW (TUBING) ×2 IMPLANT
SOL ELECTROSURG ANTI STICK (MISCELLANEOUS) ×2
SOLUTION ELECTROSURG ANTI STCK (MISCELLANEOUS) ×2 IMPLANT
SUT MNCRL 4-0 (SUTURE) ×4
SUT MNCRL 4-0 27XMFL (SUTURE) ×4
SUT V-LOC 90 ABS 3-0 VLT  V-20 (SUTURE) ×4
SUT V-LOC 90 ABS 3-0 VLT V-20 (SUTURE) IMPLANT
SUT VIC AB 2-0 SH 27 (SUTURE) ×4
SUT VIC AB 2-0 SH 27XBRD (SUTURE) ×2 IMPLANT
SUT VIC AB 3-0 SH 27 (SUTURE)
SUT VIC AB 3-0 SH 27X BRD (SUTURE) IMPLANT
SUT VICRYL 0 AB UR-6 (SUTURE) IMPLANT
SUT VLOC 90 2/L VL 12 GS22 (SUTURE) IMPLANT
SUT VLOC 90 S/L VL9 GS22 (SUTURE) IMPLANT
SUTURE MNCRL 4-0 27XMF (SUTURE) ×2 IMPLANT
TRAP FLUID SMOKE EVACUATOR (MISCELLANEOUS) ×2 IMPLANT
WATER STERILE IRR 500ML POUR (IV SOLUTION) ×2 IMPLANT

## 2022-09-25 NOTE — Interval H&P Note (Signed)
History and Physical Interval Note:  09/25/2022 10:28 AM  [name].  has presented today for surgery, with the diagnosis of inguinal hernia.  The various methods of treatment have been discussed with the patient and family. After consideration of risks, benefits and other options for treatment, the patient has consented to  Procedure(s): XI ROBOTIC ASSISTED INGUINAL HERNIA, possible bilateral (Right) as a surgical intervention.  The patient's history has been reviewed, patient examined, no change in status, stable for surgery.  I have reviewed the patient's chart and labs.  Questions were answered to the patient's satisfaction.   Pulmonary exam:  CTA without labored respirations or wheezing.  Cardiovascular:  RRR and well perfused.   The right side is marked.   Ronny Bacon

## 2022-09-25 NOTE — Anesthesia Preprocedure Evaluation (Signed)
Anesthesia Evaluation  Patient identified by MRN, date of birth, ID band Patient awake    Reviewed: Allergy & Precautions, H&P , NPO status , Patient's Chart, lab work & pertinent test results, reviewed documented beta blocker date and time   History of Anesthesia Complications Negative for: history of anesthetic complications  Airway Mallampati: I  TM Distance: >3 FB Neck ROM: full    Dental  (+) Dental Advidsory Given, Missing, Poor Dentition   Pulmonary neg shortness of breath, neg sleep apnea, neg COPD, neg recent URI, Current Smoker and Patient abstained from smoking.   Pulmonary exam normal breath sounds clear to auscultation       Cardiovascular Exercise Tolerance: Good negative cardio ROS Normal cardiovascular exam Rhythm:regular Rate:Normal     Neuro/Psych negative neurological ROS  negative psych ROS   GI/Hepatic hiatal hernia,GERD  ,,(+) Cirrhosis       , Hepatitis -, C  Endo/Other  negative endocrine ROS    Renal/GU negative Renal ROS  negative genitourinary   Musculoskeletal   Abdominal   Peds  Hematology negative hematology ROS (+)   Anesthesia Other Findings Past Medical History: No date: Alcohol abuse No date: GERD (gastroesophageal reflux disease) No date: Hepatitis C No date: History of hiatal hernia No date: History of substance use     Comment:  a.) cocaine + amphetamines + ETOH No date: Pancreatitis   Reproductive/Obstetrics negative OB ROS                             Anesthesia Physical Anesthesia Plan  ASA: 3  Anesthesia Plan: General   Post-op Pain Management:    Induction: Intravenous  PONV Risk Score and Plan: 1  Airway Management Planned: Oral ETT  Additional Equipment:   Intra-op Plan:   Post-operative Plan: Extubation in OR  Informed Consent: I have reviewed the patients History and Physical, chart, labs and discussed the procedure  including the risks, benefits and alternatives for the proposed anesthesia with the patient or authorized representative who has indicated his/her understanding and acceptance.     Dental Advisory Given  Plan Discussed with: Anesthesiologist, CRNA and Surgeon  Anesthesia Plan Comments:        Anesthesia Quick Evaluation

## 2022-09-25 NOTE — Anesthesia Procedure Notes (Signed)
Procedure Name: Intubation Date/Time: 09/25/2022 10:40 AM  Performed by: Demetrius Charity, CRNAPre-anesthesia Checklist: Patient identified, Patient being monitored, Timeout performed, Emergency Drugs available and Suction available Patient Re-evaluated:Patient Re-evaluated prior to induction Oxygen Delivery Method: Circle system utilized Preoxygenation: Pre-oxygenation with 100% oxygen Induction Type: IV induction Ventilation: Mask ventilation without difficulty Laryngoscope Size: McGraph and 4 Grade View: Grade I Tube type: Oral Tube size: 7.5 mm Number of attempts: 1 Airway Equipment and Method: Stylet and Video-laryngoscopy Placement Confirmation: ETT inserted through vocal cords under direct vision, positive ETCO2 and breath sounds checked- equal and bilateral Secured at: 24 cm Tube secured with: Tape Dental Injury: Teeth and Oropharynx as per pre-operative assessment

## 2022-09-25 NOTE — Discharge Instructions (Signed)

## 2022-09-25 NOTE — Interval H&P Note (Signed)
History and Physical Interval Note:  09/25/2022 10:13 AM  Luke Rogers.  has presented today for surgery, with the diagnosis of inguinal hernia.  The various methods of treatment have been discussed with the patient and family. After consideration of risks, benefits and other options for treatment, the patient has consented to  Procedure(s): XI ROBOTIC ASSISTED INGUINAL HERNIA, possible bilateral (Right) as a surgical intervention.  The patient's history has been reviewed, patient examined, no change in status, stable for surgery.  I have reviewed the patient's chart and labs.  Questions were answered to the patient's satisfaction.   The right side is marked.   Ronny Bacon

## 2022-09-25 NOTE — Op Note (Signed)
Robotic assisted Laparoscopic Transabdominal Bilateral Inguinal Hernia Repairs with Mesh       Pre-operative Diagnosis:  Right Inguinal Hernia   Post-operative Diagnosis: Bilateral indirect inguinal hernias   Procedure: Robotic assisted Laparoscopic  repair of Bilateral inguinal hernia(s)   Surgeon: Ronny Bacon, M.D., FACS   Anesthesia: GETA   Findings: Bilateral indirect inguinal hernia.         Procedure Details  The patient was seen again in the Holding Room. The benefits, complications, treatment options, and expected outcomes were discussed with the patient. The risks of bleeding, infection, recurrence of symptoms, failure to resolve symptoms, recurrence of hernia, ischemic orchitis, chronic pain syndrome or neuroma, were reviewed again. The likelihood of improving the patient's symptoms with return to their baseline status is good.  The patient and/or family concurred with the proposed plan, giving informed consent.  The patient was taken to Operating Room, identified  and the procedure verified as Laparoscopic Inguinal Hernia Repair. Laterality confirmed.  A Time Out was held and the above information confirmed.   Prior to the induction of general anesthesia, antibiotic prophylaxis was administered. VTE prophylaxis was in place. General endotracheal anesthesia was then administered and tolerated well. After the induction, the abdomen was prepped with Chloraprep and draped in the sterile fashion. The patient was positioned in the supine position.   After local infiltration of quarter percent Marcaine with epinephrine, stab incision was made left upper quadrant.  On the left at Palmer's point, the Veress needle is passed with sensation of the layers to penetrate the abdominal wall and into the peritoneum.  Saline drop test is confirmed peritoneal placement.  Insufflation is initiated with carbon dioxide to pressures of 15 mmHg. An 8.5 mm port is placed to the left off of the midline,  with blunt tipped trocar.  Pneumoperitoneum maintained w/o HD changes to pressures of 15 mm Hg with CO2. No evidence of bowel injuries.  Two 8.5 mm ports placed under direct vision in each upper quadrant.  Upon placement of the right upper quadrant blunt tipped trocar, some momentum carried the tip into the adjacent mesentery.  Some blood was noted. The laparoscopy revealed bilateral indirect defect(s).   The robot was brought ot the table and docked in the standard fashion, no collision between arms was observed. Instruments were kept under direct view at all times. Utilizing fenestrated graspers, I evaluated the area of the transverse colonic mesentery and the adjacent small bowel to ensure that there is no significant mesenteric injury, or bowel involvement.  The bleeding had been limited, there was no hemostasis to obtain.  Although adjacent to the transverse colon, there is no evidence of any serosal injury that would benefit from suture reinforcement, or further exploration. For bilateral inguinal hernia repair,  I developed a peritoneal flap. The sac(s) were reduced and dissected free from adjacent structures. We preserved the vas and the vessels, and visualized them to their convergence and beyond in the retroperitoneum. Once dissection was completed a large left sided BARD 3D Light mesh was placed and secured at three points with interrupted 2-0 Vicryl to the pubic tubercle and anteriorly. There was good coverage of the direct, indirect and femoral spaces. Second look revealed no complications or injuries. Attention then was turned to the opposite side. The sac was also reduced and dissected free from adjacent structures. We preserved the vas and the vessels, in like manner with adequate posterior dissection. Once dissection was completed an extra-large right sided mesh was placed and secured  in like manner with interrupted 2-0 Vicryl. There was good coverage of the direct, indirect and femoral  spaces. The flap was then closed with 3-0 V-lock suture.  Peritoneal closure completed with additional sutures, to confirm there were no defects.  Once assuring that hemostasis was adequate, all needles/sponges removed, and the robot was undocked.  Under direct visualization I placed the Veress needle into the preperitoneal space the Veress' valve was released allowing extraperitoneal CO2 to escape, it was also used to access the space for supplemental local anesthesia. The ports were removed, the abdomen desulflated.  4-0 subcuticular Monocryl was used at all skin edges. Dermabond was placed.  Patient tolerated the procedure well. There were no complications. He was taken to the recovery room in stable condition.           Ronny Bacon, M.D., FACS 09/25/2022, 12:53 PM

## 2022-09-25 NOTE — Transfer of Care (Signed)
Immediate Anesthesia Transfer of Care Note  Patient: Luke Rogers.  Procedure(s) Performed: XI ROBOTIC ASSISTED INGUINAL HERNIA (Bilateral: Abdomen) INSERTION OF MESH (Bilateral: Inguinal)  Patient Location: PACU  Anesthesia Type:General  Level of Consciousness: drowsy  Airway & Oxygen Therapy: Patient Spontanous Breathing and Patient connected to face mask oxygen  Post-op Assessment: Report given to RN  Post vital signs: Reviewed and stable  Last Vitals:  Vitals Value Taken Time  BP 131/79 09/25/22 1300  Temp 36.7 C 09/25/22 1300  Pulse 79 09/25/22 1310  Resp 13 09/25/22 1310  SpO2 100 % 09/25/22 1310  Vitals shown include unvalidated device data.  Last Pain:  Vitals:   09/25/22 0901  TempSrc: Oral  PainSc: 5          Complications: No notable events documented.

## 2022-09-26 ENCOUNTER — Encounter: Payer: Self-pay | Admitting: Surgery

## 2022-09-26 NOTE — Anesthesia Postprocedure Evaluation (Signed)
Anesthesia Post Note  Patient: Luke Rogers.  Procedure(s) Performed: XI ROBOTIC ASSISTED INGUINAL HERNIA (Bilateral: Abdomen) INSERTION OF MESH (Bilateral: Inguinal)  Patient location during evaluation: PACU Anesthesia Type: General Level of consciousness: awake and alert Pain management: pain level controlled Vital Signs Assessment: post-procedure vital signs reviewed and stable Respiratory status: spontaneous breathing, nonlabored ventilation, respiratory function stable and patient connected to nasal cannula oxygen Cardiovascular status: blood pressure returned to baseline and stable Postop Assessment: no apparent nausea or vomiting Anesthetic complications: no   No notable events documented.   Last Vitals:  Vitals:   09/25/22 1338 09/25/22 1354  BP: 123/85 (!) 140/77  Pulse:  81  Resp:  18  Temp: 36.4 C 36.7 C  SpO2: 92% 94%    Last Pain:  Vitals:   09/26/22 0843  TempSrc:   PainSc: 0-No pain                 Martha Clan

## 2022-09-27 ENCOUNTER — Encounter: Payer: Self-pay | Admitting: Surgery

## 2022-10-03 DIAGNOSIS — M7989 Other specified soft tissue disorders: Secondary | ICD-10-CM | POA: Insufficient documentation

## 2022-10-03 HISTORY — DX: Other specified soft tissue disorders: M79.89

## 2022-10-10 ENCOUNTER — Ambulatory Visit (INDEPENDENT_AMBULATORY_CARE_PROVIDER_SITE_OTHER): Payer: Self-pay | Admitting: Surgery

## 2022-10-10 ENCOUNTER — Encounter: Payer: Self-pay | Admitting: Surgery

## 2022-10-10 VITALS — BP 112/67 | HR 99 | Temp 98.0°F | Ht 74.5 in | Wt 164.0 lb

## 2022-10-10 DIAGNOSIS — K449 Diaphragmatic hernia without obstruction or gangrene: Secondary | ICD-10-CM

## 2022-10-10 DIAGNOSIS — Z09 Encounter for follow-up examination after completed treatment for conditions other than malignant neoplasm: Secondary | ICD-10-CM

## 2022-10-10 DIAGNOSIS — Z8719 Personal history of other diseases of the digestive system: Secondary | ICD-10-CM

## 2022-10-10 DIAGNOSIS — K4020 Bilateral inguinal hernia, without obstruction or gangrene, not specified as recurrent: Secondary | ICD-10-CM

## 2022-10-10 NOTE — Progress Notes (Signed)
[facility] POST-OP OFFICE VISIT  10/10/2022  HPI: Luke Rogers. is a 62 y.o. male 15 days s/p robotic repair of bilateral inguinal hernias.  He had surgery scheduled tomorrow to evaluate a left lower extremity mass.  He has no complaints regarding his hernia as expected soreness in the right, otherwise quite pleased with his recovery.  Vital signs: BP 112/67   Pulse 99   Temp 98 F (36.7 C)   Ht 6' 2.5" (1.892 m)   Wt 164 lb (74.4 kg)   SpO2 98%   BMI 20.77 kg/m    Physical Exam: Constitutional: Appears well Abdomen: Soft nontender incisions well-healed Dermabond   Assessment/Plan: This is a 62 y.o. male 15 days s/p bilateral inguinal hernia repair.  Patient Active Problem List   Diagnosis Date Noted   Non-recurrent bilateral inguinal hernia without obstruction or gangrene 09/19/2022   Recurrent acute pancreatitis 07/17/2022   Pancreatic mass 07/17/2022   Left renal mass 07/17/2022   Hypomagnesemia 12/22/2021   GERD (gastroesophageal reflux disease) 12/22/2021   Visual hallucination    Accidental drug overdose    Hyponatremia [id number]   Metabolic acidosis [id number]   Hyperbilirubinemia 12/18/2021   Visual hallucinations 12/18/2021   Amphetamine intoxication ([place]) 12/18/2021   Amphetamine adverse reaction 12/18/2021   Alcohol abuse    Elevated LFTs    Thrombocytopenia (HCC)    Tobacco abuse    Acute alcoholic pancreatitis [id number]   Nicotine dependence 01/17/2021   Transaminitis 01/17/2021   Malnutrition of moderate degree 01/09/2018   Acute purulent meningitis 01/08/2018   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   Ronny Bacon M.D., FACS 10/10/2022, 2:14 PM

## 2022-10-10 NOTE — Patient Instructions (Addendum)
We will refer you to gastroenterology for them to do an upper endoscopy on you to evaluate your swallowing.   GENERAL POST-OPERATIVE PATIENT INSTRUCTIONS   WOUND CARE INSTRUCTIONS:  Try to keep the wound dry and avoid ointments on the wound unless directed to do so.  If the wound becomes bright red and painful or starts to drain infected material that is not clear, please contact your physician immediately.  If the wound is mildly pink and has a thick firm ridge underneath it, this is normal, and is referred to as a healing ridge.  This will resolve over the next 4-6 weeks.  BATHING: You may shower if you have been informed of this by your surgeon. However, Please do not submerge in a tub, hot tub, or pool until incisions are completely sealed or have been told by your surgeon that you may do so.  DIET:  You may eat any foods that you can tolerate.  It is a good idea to eat a high fiber diet and take in plenty of fluids to prevent constipation.  If you do become constipated you may want to take a mild laxative or take ducolax tablets on a daily basis until your bowel habits are regular.  Constipation can be very uncomfortable, along with straining, after recent surgery.  ACTIVITY:  You should not lift more than 20 pounds for 6 weeks total after surgery as it could put you at increased risk for complications.  Twenty pounds is roughly equivalent to a plastic bag of groceries. At that time- Listen to your body when lifting, if you have pain when lifting, stop and then try again in a few days. Soreness after doing exercises or activities of daily living is normal as you get back in to your normal routine.  MEDICATIONS:  Try to take narcotic medications and anti-inflammatory medications, such as tylenol, ibuprofen, naprosyn, etc., with food.  This will minimize stomach upset from the medication.  Should you develop nausea and vomiting from the pain medication, or develop a rash, please discontinue the  medication and contact your physician.  You should not drive, make important decisions, or operate machinery when taking narcotic pain medication.  SUNBLOCK Use sun block to incision area over the next year if this area will be exposed to sun. This helps decrease scarring and will allow you avoid a permanent darkened area over your incision.  QUESTIONS:  Please feel free to call our office if you have any questions, and we will be glad to assist you. [phone]

## 2022-11-22 ENCOUNTER — Other Ambulatory Visit: Payer: Self-pay

## 2022-11-22 ENCOUNTER — Emergency Department: Payer: Medicaid Other

## 2022-11-22 DIAGNOSIS — G9349 Other encephalopathy: Secondary | ICD-10-CM | POA: Diagnosis present

## 2022-11-22 DIAGNOSIS — Z885 Allergy status to narcotic agent status: Secondary | ICD-10-CM

## 2022-11-22 DIAGNOSIS — G9341 Metabolic encephalopathy: Secondary | ICD-10-CM | POA: Diagnosis present

## 2022-11-22 DIAGNOSIS — R54 Age-related physical debility: Secondary | ICD-10-CM | POA: Diagnosis present

## 2022-11-22 DIAGNOSIS — E871 Hypo-osmolality and hyponatremia: Secondary | ICD-10-CM | POA: Diagnosis present

## 2022-11-22 DIAGNOSIS — A419 Sepsis, unspecified organism: Principal | ICD-10-CM | POA: Diagnosis present

## 2022-11-22 DIAGNOSIS — Z515 Encounter for palliative care: Secondary | ICD-10-CM

## 2022-11-22 DIAGNOSIS — F17210 Nicotine dependence, cigarettes, uncomplicated: Secondary | ICD-10-CM | POA: Diagnosis present

## 2022-11-22 DIAGNOSIS — Z7141 Alcohol abuse counseling and surveillance of alcoholic: Secondary | ICD-10-CM

## 2022-11-22 DIAGNOSIS — Z8619 Personal history of other infectious and parasitic diseases: Secondary | ICD-10-CM

## 2022-11-22 DIAGNOSIS — Z66 Do not resuscitate: Secondary | ICD-10-CM | POA: Diagnosis present

## 2022-11-22 DIAGNOSIS — K860 Alcohol-induced chronic pancreatitis: Secondary | ICD-10-CM | POA: Diagnosis present

## 2022-11-22 DIAGNOSIS — K863 Pseudocyst of pancreas: Secondary | ICD-10-CM | POA: Diagnosis present

## 2022-11-22 DIAGNOSIS — R6520 Severe sepsis without septic shock: Secondary | ICD-10-CM | POA: Diagnosis present

## 2022-11-22 DIAGNOSIS — E512 Wernicke's encephalopathy: Secondary | ICD-10-CM | POA: Diagnosis present

## 2022-11-22 DIAGNOSIS — K7031 Alcoholic cirrhosis of liver with ascites: Secondary | ICD-10-CM | POA: Diagnosis present

## 2022-11-22 DIAGNOSIS — F10231 Alcohol dependence with withdrawal delirium: Secondary | ICD-10-CM | POA: Diagnosis present

## 2022-11-22 DIAGNOSIS — K5900 Constipation, unspecified: Secondary | ICD-10-CM | POA: Diagnosis not present

## 2022-11-22 DIAGNOSIS — J189 Pneumonia, unspecified organism: Secondary | ICD-10-CM | POA: Diagnosis present

## 2022-11-22 DIAGNOSIS — E440 Moderate protein-calorie malnutrition: Secondary | ICD-10-CM | POA: Diagnosis present

## 2022-11-22 DIAGNOSIS — G312 Degeneration of nervous system due to alcohol: Secondary | ICD-10-CM | POA: Diagnosis present

## 2022-11-22 DIAGNOSIS — J90 Pleural effusion, not elsewhere classified: Secondary | ICD-10-CM | POA: Diagnosis present

## 2022-11-22 DIAGNOSIS — R6339 Other feeding difficulties: Secondary | ICD-10-CM | POA: Diagnosis present

## 2022-11-22 DIAGNOSIS — E7220 Disorder of urea cycle metabolism, unspecified: Secondary | ICD-10-CM | POA: Diagnosis present

## 2022-11-22 DIAGNOSIS — K219 Gastro-esophageal reflux disease without esophagitis: Secondary | ICD-10-CM | POA: Diagnosis present

## 2022-11-22 DIAGNOSIS — K449 Diaphragmatic hernia without obstruction or gangrene: Secondary | ICD-10-CM | POA: Diagnosis present

## 2022-11-22 DIAGNOSIS — K766 Portal hypertension: Secondary | ICD-10-CM | POA: Diagnosis present

## 2022-11-22 DIAGNOSIS — Z681 Body mass index (BMI) 19 or less, adult: Secondary | ICD-10-CM

## 2022-11-22 DIAGNOSIS — R1310 Dysphagia, unspecified: Secondary | ICD-10-CM | POA: Diagnosis present

## 2022-11-22 DIAGNOSIS — J9601 Acute respiratory failure with hypoxia: Secondary | ICD-10-CM | POA: Diagnosis present

## 2022-11-22 DIAGNOSIS — Z8249 Family history of ischemic heart disease and other diseases of the circulatory system: Secondary | ICD-10-CM

## 2022-11-22 DIAGNOSIS — K852 Alcohol induced acute pancreatitis without necrosis or infection: Secondary | ICD-10-CM | POA: Diagnosis present

## 2022-11-22 LAB — COMPREHENSIVE METABOLIC PANEL
ALT: 12 U/L (ref 0–44)
AST: 41 U/L (ref 15–41)
Albumin: 2.8 g/dL — ABNORMAL LOW (ref 3.5–5.0)
Alkaline Phosphatase: 93 U/L (ref 38–126)
Anion gap: 11 (ref 5–15)
BUN: 9 mg/dL (ref 6–20)
CO2: 23 mmol/L (ref 22–32)
Calcium: 8.3 mg/dL — ABNORMAL LOW (ref 8.9–10.3)
Chloride: 99 mmol/L (ref 98–111)
Creatinine, Ser: 0.68 mg/dL (ref 0.61–1.24)
GFR, Estimated: >60 mL/min (ref >60)
Glucose, Bld: 126 mg/dL — ABNORMAL HIGH (ref 70–99)
Potassium: 3.5 mmol/L (ref 3.5–5.1)
Sodium: 133 mmol/L — ABNORMAL LOW (ref 135–145)
Total Bilirubin: 1.6 mg/dL — ABNORMAL HIGH (ref 0.3–1.2)
Total Protein: 8 g/dL (ref 6.5–8.1)

## 2022-11-22 LAB — URINALYSIS, ROUTINE W REFLEX MICROSCOPIC
Bilirubin Urine: NEGATIVE
Glucose, UA: NEGATIVE mg/dL
Hgb urine dipstick: NEGATIVE
Ketones, ur: NEGATIVE mg/dL
Leukocytes,Ua: NEGATIVE
Nitrite: NEGATIVE
Protein, ur: NEGATIVE mg/dL
Specific Gravity, Urine: 1.027 (ref 1.005–1.030)
pH: 5 (ref 5.0–8.0)

## 2022-11-22 LAB — LIPASE, BLOOD: Lipase: 1123 U/L — ABNORMAL HIGH (ref 11–51)

## 2022-11-22 LAB — CBC
HCT: 38.1 % — ABNORMAL LOW (ref 39.0–52.0)
Hemoglobin: 13.2 g/dL (ref 13.0–17.0)
MCH: 33.2 pg (ref 26.0–34.0)
MCHC: 34.6 g/dL (ref 30.0–36.0)
MCV: 96 fL (ref 80.0–100.0)
Platelets: 190 10*3/uL (ref 150–400)
RBC: 3.97 MIL/uL — ABNORMAL LOW (ref 4.22–5.81)
RDW: 14.7 % (ref 11.5–15.5)
WBC: 12.2 10*3/uL — ABNORMAL HIGH (ref 4.0–10.5)
nRBC: 0 % (ref 0.0–0.2)

## 2022-11-22 MED ORDER — ONDANSETRON HCL 4 MG/2ML IJ SOLN
4.0000 mg | Freq: Once | INTRAMUSCULAR | Status: AC
  Administered 2022-11-22: 4 mg via INTRAVENOUS
  Filled 2022-11-22: qty 2

## 2022-11-22 MED ORDER — IOHEXOL 300 MG/ML  SOLN
100.0000 mL | Freq: Once | INTRAMUSCULAR | Status: AC | PRN
  Administered 2022-11-22: 100 mL via INTRAVENOUS

## 2022-11-22 NOTE — ED Triage Notes (Signed)
Pt reports abd pain with bloating and n/v/d. Reports onset yesterday. Reports hx of recurrent pancreatitis and that the pain and bloating are the same. Pt ambulatory to triage. Alert and oriented following commands. Breathing unlabored speaking in full sentences.

## 2022-11-22 NOTE — ED Notes (Signed)
No answer when called several times from lobby; called phone # listed on chart with no answer

## 2022-11-23 ENCOUNTER — Inpatient Hospital Stay: Payer: Medicaid Other

## 2022-11-23 ENCOUNTER — Inpatient Hospital Stay
Admission: EM | Admit: 2022-11-23 | Discharge: 2022-12-19 | DRG: 871 | Disposition: A | Discharge status: Home / Self Care | Payer: Medicaid Other | Attending: Internal Medicine | Admitting: Internal Medicine

## 2022-11-23 DIAGNOSIS — Z515 Encounter for palliative care: Secondary | ICD-10-CM | POA: Diagnosis not present

## 2022-11-23 DIAGNOSIS — Z720 Tobacco use: Secondary | ICD-10-CM | POA: Diagnosis present

## 2022-11-23 DIAGNOSIS — K219 Gastro-esophageal reflux disease without esophagitis: Secondary | ICD-10-CM | POA: Diagnosis present

## 2022-11-23 DIAGNOSIS — F1010 Alcohol abuse, uncomplicated: Secondary | ICD-10-CM | POA: Diagnosis present

## 2022-11-23 DIAGNOSIS — J9601 Acute respiratory failure with hypoxia: Secondary | ICD-10-CM | POA: Diagnosis present

## 2022-11-23 DIAGNOSIS — R569 Unspecified convulsions: Secondary | ICD-10-CM | POA: Diagnosis not present

## 2022-11-23 DIAGNOSIS — E440 Moderate protein-calorie malnutrition: Secondary | ICD-10-CM | POA: Diagnosis present

## 2022-11-23 DIAGNOSIS — J189 Pneumonia, unspecified organism: Secondary | ICD-10-CM | POA: Diagnosis present

## 2022-11-23 DIAGNOSIS — Z66 Do not resuscitate: Secondary | ICD-10-CM | POA: Diagnosis present

## 2022-11-23 DIAGNOSIS — K863 Pseudocyst of pancreas: Secondary | ICD-10-CM | POA: Diagnosis present

## 2022-11-23 DIAGNOSIS — K860 Alcohol-induced chronic pancreatitis: Secondary | ICD-10-CM | POA: Diagnosis present

## 2022-11-23 DIAGNOSIS — F10931 Alcohol use, unspecified with withdrawal delirium: Secondary | ICD-10-CM | POA: Diagnosis not present

## 2022-11-23 DIAGNOSIS — F10231 Alcohol dependence with withdrawal delirium: Secondary | ICD-10-CM | POA: Diagnosis present

## 2022-11-23 DIAGNOSIS — K859 Acute pancreatitis without necrosis or infection, unspecified: Secondary | ICD-10-CM | POA: Diagnosis present

## 2022-11-23 DIAGNOSIS — K852 Alcohol induced acute pancreatitis without necrosis or infection: Secondary | ICD-10-CM | POA: Diagnosis not present

## 2022-11-23 DIAGNOSIS — K766 Portal hypertension: Secondary | ICD-10-CM | POA: Diagnosis present

## 2022-11-23 DIAGNOSIS — F17200 Nicotine dependence, unspecified, uncomplicated: Secondary | ICD-10-CM | POA: Diagnosis present

## 2022-11-23 DIAGNOSIS — G9349 Other encephalopathy: Secondary | ICD-10-CM | POA: Diagnosis present

## 2022-11-23 DIAGNOSIS — J90 Pleural effusion, not elsewhere classified: Secondary | ICD-10-CM | POA: Diagnosis present

## 2022-11-23 DIAGNOSIS — G9341 Metabolic encephalopathy: Secondary | ICD-10-CM | POA: Diagnosis not present

## 2022-11-23 DIAGNOSIS — E43 Unspecified severe protein-calorie malnutrition: Secondary | ICD-10-CM | POA: Diagnosis present

## 2022-11-23 DIAGNOSIS — E512 Wernicke's encephalopathy: Secondary | ICD-10-CM | POA: Diagnosis not present

## 2022-11-23 DIAGNOSIS — E871 Hypo-osmolality and hyponatremia: Secondary | ICD-10-CM | POA: Diagnosis present

## 2022-11-23 DIAGNOSIS — Z681 Body mass index (BMI) 19 or less, adult: Secondary | ICD-10-CM | POA: Diagnosis not present

## 2022-11-23 DIAGNOSIS — E7220 Disorder of urea cycle metabolism, unspecified: Secondary | ICD-10-CM | POA: Diagnosis present

## 2022-11-23 DIAGNOSIS — K7031 Alcoholic cirrhosis of liver with ascites: Secondary | ICD-10-CM | POA: Diagnosis present

## 2022-11-23 DIAGNOSIS — R4182 Altered mental status, unspecified: Secondary | ICD-10-CM | POA: Diagnosis not present

## 2022-11-23 DIAGNOSIS — G928 Other toxic encephalopathy: Secondary | ICD-10-CM | POA: Diagnosis not present

## 2022-11-23 DIAGNOSIS — A419 Sepsis, unspecified organism: Secondary | ICD-10-CM | POA: Diagnosis present

## 2022-11-23 DIAGNOSIS — F17210 Nicotine dependence, cigarettes, uncomplicated: Secondary | ICD-10-CM | POA: Diagnosis not present

## 2022-11-23 DIAGNOSIS — G312 Degeneration of nervous system due to alcohol: Secondary | ICD-10-CM | POA: Diagnosis present

## 2022-11-23 DIAGNOSIS — R6520 Severe sepsis without septic shock: Secondary | ICD-10-CM | POA: Diagnosis present

## 2022-11-23 LAB — LIPASE, BLOOD: Lipase: 410 U/L — ABNORMAL HIGH (ref 11–51)

## 2022-11-23 MED ORDER — LORAZEPAM 2 MG/ML IJ SOLN: 0.0000 mg | Freq: Two times a day (BID) | INTRAMUSCULAR | Status: DC

## 2022-11-23 MED ORDER — HYDROMORPHONE HCL 1 MG/ML IJ SOLN
1.0000 mg | Freq: Once | INTRAMUSCULAR | Status: AC
  Administered 2022-11-23: 1 mg via INTRAVENOUS
  Filled 2022-11-23: qty 1

## 2022-11-23 MED ORDER — ONDANSETRON HCL 4 MG/2ML IJ SOLN
4.0000 mg | Freq: Once | INTRAMUSCULAR | Status: AC
  Administered 2022-11-23: 4 mg via INTRAVENOUS
  Filled 2022-11-23: qty 2

## 2022-11-23 MED ORDER — ACETAMINOPHEN 325 MG PO TABS
650.0000 mg | ORAL_TABLET | Freq: Four times a day (QID) | ORAL | Status: DC | PRN
  Administered 2022-11-25: 650 mg via ORAL
  Filled 2022-11-23: qty 2

## 2022-11-23 MED ORDER — ONDANSETRON HCL 4 MG/2ML IJ SOLN
4.0000 mg | Freq: Four times a day (QID) | INTRAMUSCULAR | Status: DC | PRN
  Administered 2022-12-01 – 2022-12-03 (×2): 4 mg via INTRAVENOUS
  Filled 2022-11-23 (×2): qty 2

## 2022-11-23 MED ORDER — ACETAMINOPHEN 650 MG RE SUPP
650.0000 mg | Freq: Four times a day (QID) | RECTAL | Status: DC | PRN
  Filled 2022-11-23: qty 1

## 2022-11-23 MED ORDER — NICOTINE 21 MG/24HR TD PT24
21.0000 mg | MEDICATED_PATCH | Freq: Every day | TRANSDERMAL | Status: DC
  Administered 2022-11-25 – 2022-12-18 (×24): 21 mg via TRANSDERMAL
  Filled 2022-11-23 (×26): qty 1

## 2022-11-23 MED ORDER — PANTOPRAZOLE SODIUM 40 MG IV SOLR
40.0000 mg | INTRAVENOUS | Status: DC
  Administered 2022-11-23 – 2022-12-09 (×17): 40 mg via INTRAVENOUS
  Filled 2022-11-23 (×16): qty 10

## 2022-11-23 MED ORDER — LORAZEPAM 2 MG/ML IJ SOLN
0.0000 mg | Freq: Four times a day (QID) | INTRAMUSCULAR | Status: DC
  Administered 2022-11-23 (×2): 1 mg via INTRAVENOUS
  Administered 2022-11-24: 2 mg via INTRAVENOUS
  Filled 2022-11-23 (×4): qty 1

## 2022-11-23 MED ORDER — LORAZEPAM 2 MG PO TABS: 0.0000 mg | ORAL_TABLET | Freq: Two times a day (BID) | ORAL | Status: DC

## 2022-11-23 MED ORDER — THIAMINE HCL 100 MG/ML IJ SOLN
100.0000 mg | Freq: Every day | INTRAMUSCULAR | Status: DC
  Administered 2022-11-25 – 2022-12-04 (×4): 100 mg via INTRAVENOUS
  Filled 2022-11-23 (×5): qty 2

## 2022-11-23 MED ORDER — SODIUM CHLORIDE 0.9 % IV SOLN: INTRAVENOUS | Status: DC

## 2022-11-23 MED ORDER — OXYCODONE HCL 5 MG PO TABS
5.0000 mg | ORAL_TABLET | ORAL | Status: DC | PRN
  Administered 2022-11-23 – 2022-12-04 (×9): 5 mg via ORAL
  Filled 2022-11-23 (×9): qty 1

## 2022-11-23 MED ORDER — LORAZEPAM 2 MG PO TABS
0.0000 mg | ORAL_TABLET | Freq: Four times a day (QID) | ORAL | Status: DC
  Administered 2022-11-23 – 2022-11-24 (×2): 2 mg via ORAL
  Filled 2022-11-23 (×2): qty 1

## 2022-11-23 MED ORDER — ONDANSETRON HCL 4 MG PO TABS
4.0000 mg | ORAL_TABLET | Freq: Four times a day (QID) | ORAL | Status: DC | PRN
  Administered 2022-11-25: 4 mg via ORAL
  Filled 2022-11-23: qty 1

## 2022-11-23 MED ORDER — HEPARIN SODIUM (PORCINE) 5000 UNIT/ML IJ SOLN
5000.0000 [IU] | Freq: Three times a day (TID) | INTRAMUSCULAR | Status: DC
  Administered 2022-11-23: 5000 [IU] via SUBCUTANEOUS
  Filled 2022-11-23: qty 1

## 2022-11-23 MED ORDER — ENOXAPARIN SODIUM 40 MG/0.4ML IJ SOSY
40.0000 mg | PREFILLED_SYRINGE | INTRAMUSCULAR | Status: DC
  Administered 2022-11-23 – 2022-12-18 (×25): 40 mg via SUBCUTANEOUS
  Filled 2022-11-23 (×25): qty 0.4

## 2022-11-23 MED ORDER — MORPHINE SULFATE (PF) 2 MG/ML IV SOLN
2.0000 mg | INTRAVENOUS | Status: DC | PRN
  Administered 2022-11-23 – 2022-12-12 (×26): 2 mg via INTRAVENOUS
  Filled 2022-11-23 (×27): qty 1

## 2022-11-23 MED ORDER — THIAMINE MONONITRATE 100 MG PO TABS
100.0000 mg | ORAL_TABLET | Freq: Every day | ORAL | Status: DC
  Administered 2022-11-23 – 2022-12-02 (×7): 100 mg via ORAL
  Filled 2022-11-23 (×9): qty 1

## 2022-11-23 NOTE — Assessment & Plan Note (Addendum)
-  Acute pancreatitis with lipase nearly 1200 -Last RUQ was 2022, update RUQ [place] -Drinks 120 oz of beer per day  -NPO -Fluids -Pain control -Lipid panel in the AM -Continue to monitor

## 2022-11-23 NOTE — ED Provider Notes (Signed)
[facility] Provider Note    Event Date/Time   First MD Initiated Contact with Patient 11/23/22 [phone]     (approximate)   History   Abdominal Pain   HPI  Luke Rogers. is a 62 y.o. male with history of chronic alcoholism, cirrhosis, recurrent pancreatitis, here with nausea and vomiting with abdominal pain.  The patient states that for the last 2 to 3 days, he separatively worsening abdominal pain, distention, and nausea and vomiting.  He has been unable to keep anything down.  Symptoms feel similar to his previous episodes of pancreatitis.  He does admit to continued alcohol use.  No fevers or chills.  No diarrhea.     Physical Exam   Triage Vital Signs: ED Triage Vitals  Enc Vitals Group     BP 11/22/22 1938 (!) 148/95     Pulse Rate 11/22/22 1938 94     Resp 11/22/22 1940 18     Temp 11/22/22 1938 98.1 F (36.7 C)     Temp Source 11/22/22 1938 Oral     SpO2 11/22/22 1938 97 %     Weight --      Height 11/22/22 1939 6\' 2"  (1.88 m)     Head Circumference --      Peak Flow --      Pain Score 11/22/22 1939 8     Pain Loc --      Pain Edu? --      Excl. in GC? --     Most recent vital signs: Vitals:   11/23/22 0130 11/23/22 0200  BP: (!) 139/97 (!) 130/112  Pulse: (!) 103 89  Resp: (!) 21 (!) 22  Temp:    SpO2: 97% 96%     General: Awake, no distress.  CV:  Good peripheral perfusion.  Resp:  Normal work of breathing.  Abd:  Moderate epigastric tenderness with slight abdominal distention.  No peritonitis. Other:  No lower extremity edema.  No asterixis.   ED Results / Procedures / Treatments   Labs (all labs ordered are listed, but only abnormal results are displayed) Labs Reviewed  LIPASE, BLOOD - Abnormal; Notable for the following components:      Result Value   Lipase 1,123 (*)    All other components within normal limits  COMPREHENSIVE METABOLIC PANEL - Abnormal; Notable for the following components:   Sodium 133  (*)    Glucose, Bld 126 (*)    Calcium 8.3 (*)    Albumin 2.8 (*)    Total Bilirubin 1.6 (*)    All other components within normal limits  CBC - Abnormal; Notable for the following components:   WBC 12.2 (*)    RBC 3.97 (*)    HCT 38.1 (*)    All other components within normal limits  URINALYSIS, ROUTINE W REFLEX MICROSCOPIC - Abnormal; Notable for the following components:   Color, Urine AMBER (*)    APPearance HAZY (*)    All other components within normal limits     EKG    RADIOLOGY CT abdomen/pelvis: Acute on chronic pancreatitis with mild increase in pseudocyst, no obstructions, moderate to large volume ascites   I also independently reviewed and agree with radiologist interpretations.   PROCEDURES:  Critical Care performed: No   MEDICATIONS ORDERED IN ED: Medications  0.9 %  sodium chloride infusion ( Intravenous New Bag/Given 11/23/22 0143)  LORazepam (ATIVAN) injection 0-4 mg (1 mg Intravenous Given 11/23/22 0249)  Or  LORazepam (ATIVAN) tablet 0-4 mg ( Oral See Alternative 11/23/22 0249)  LORazepam (ATIVAN) injection 0-4 mg (has no administration in time range)    Or  LORazepam (ATIVAN) tablet 0-4 mg (has no administration in time range)  thiamine (VITAMIN B1) tablet 100 mg (has no administration in time range)    Or  thiamine (VITAMIN B1) injection 100 mg (has no administration in time range)  ondansetron (ZOFRAN) injection 4 mg (4 mg Intravenous Given 11/22/22 2130)  iohexol (OMNIPAQUE) 300 MG/ML solution 100 mL (100 mLs Intravenous Contrast Given 11/22/22 2137)  HYDROmorphone (DILAUDID) injection 1 mg (1 mg Intravenous Given 11/23/22 0144)  ondansetron (ZOFRAN) injection 4 mg (4 mg Intravenous Given 11/23/22 0144)  HYDROmorphone (DILAUDID) injection 1 mg (1 mg Intravenous Given 11/23/22 0249)     IMPRESSION / MDM / ASSESSMENT AND PLAN / ED COURSE  I reviewed the triage vital signs and the nursing notes.                              Differential diagnosis  includes, but is not limited to, recurrent pancreatitis, worsening cirrhosis with ascites, spontaneous bacterial peritonitis, gastritis, obstruction  Patient's presentation is most consistent with acute presentation with potential threat to life or bodily function.  The patient is on the cardiac monitor to evaluate for evidence of arrhythmia and/or significant heart rate changes   62 year old male here with abdominal pain and distention.  Suspect recurrent alcoholic pancreatitis.  He has evidence of acute pancreatitis on imaging as well as a significantly elevated lipase.  He also has worsening ascites.  White count 12.2, no fevers or signs of sepsis.  No evidence suggest bacterial peritonitis.  UA negative.  Patient given IV fluids, analgesia, and will admit to medicine.  He was placed on CIWA protocol.  No signs of significant withdrawal at this time.   FINAL CLINICAL IMPRESSION(S) / ED DIAGNOSES   Final diagnoses:  Alcohol-induced acute pancreatitis without infection or necrosis     Rx / DC Orders   ED Discharge Orders     None        Note:  This document was prepared using Dragon voice recognition software and may include unintentional dictation errors.   Shaune Pollack, MD 11/23/22 [phone]

## 2022-11-23 NOTE — ED Notes (Signed)
US at bedside

## 2022-11-23 NOTE — H&P (Signed)
History and Physical    Patient: Luke Rogers. ZOX:[id number] DOB: 11/22/1960 DOA: 11/23/2022 DOS: the patient was seen and examined on 11/23/2022 PCP: Patient, No Pcp Per  Patient coming from: Home  Chief Complaint:  Chief Complaint  Patient presents with   Abdominal Pain   HPI: Luke Rogers. is a 62 y.o. male with medical history significant of alcohol abuse, GERD, hepatitis C, history of substance abuse, recurrent pancreatitis, and more presents the ED with a chief complaint of bloating.  Patient reports he started feeling bloated on Tuesday.  Its been worse since it started.  He has associated abdominal pain that feels like sharp knives diffusely throughout his abdomen and wrapping around the right side of his body.  The pain is intermittent.  Laying down makes it worse.  Getting up and walking around makes it better.  Tylenol offers minimal relief.  He has had a subjective fever.  He has had nausea and vomiting 2 times per day.  The emesis is nonbloody.  His last normal meal was 2 days ago.  His last normal bowel movement was 2 days ago.  He has not had a bowel movement since the pain started and that normal for him during these bouts of pancreatitis.  His last bout of pancreatitis was in November.  On review of systems girlfriend is concerned about tremors that lasted 10 minutes.  They happened 4 nights over last week.  He has recently tried to cut down his drinking from 3 tall boys, 2- tall boys per day.  They have no other complaints at this time.  Patient does smoke a pack per day.  He drinks alcohol, 3 tall boys per day recently tried to cut down to 2 tall boys per day.  He does not use illicit drugs anymore.  He is vaccinated for COVID.  Patient is full code. Review of Systems: As mentioned in the history of present illness. All other systems reviewed and are negative. Past Medical History:  Diagnosis Date   Alcohol abuse    GERD (gastroesophageal reflux disease)     Hepatitis C    History of hiatal hernia    History of substance use    a.) cocaine + amphetamines + ETOH   Pancreatitis    Past Surgical History:  Procedure Laterality Date   FOOT SURGERY Bilateral    As infant   INSERTION OF MESH Bilateral 09/25/2022   Procedure: INSERTION OF MESH;  Surgeon: Campbell Lerner, MD;  Location: [facility] ORS;  Service: General;  Laterality: Bilateral;   WRIST SURGERY Right    Social History:  reports that he has been smoking cigarettes. He has been smoking an average of .5 packs per day. He has been exposed to tobacco smoke. He has never used smokeless tobacco. He reports current alcohol use of about 63.0 standard drinks of alcohol per week. He reports current drug use. Drug: Amphetamines.  Allergies  Allergen Reactions   Tramadol Nausea And Vomiting    Family History  Problem Relation Age of Onset   Hypertension Mother     Prior to Admission medications   Medication Sig Start Date End Date Taking? Authorizing Provider  ibuprofen (ADVIL) 800 MG tablet Take 1 tablet (800 mg total) by mouth every 8 (eight) hours as needed. Patient not taking: Reported on 11/23/2022 09/25/22   Campbell Lerner, MD    Physical Exam: Vitals:   11/23/22 0330 11/23/22 0400 11/23/22 0430 11/23/22 0500  BP: 131/88 (!) 132/93 129/88 Marland Kitchen)  135/97  Pulse: 89 86 88 89  Resp: 17 13 17  (!) 21  Temp:      TempSrc:      SpO2: 95% 91% 92% 94%  Height:       1.  General: Patient lying supine in bed,  no acute distress   2. Psychiatric: Alert and oriented x 3, mood and behavior normal for situation, pleasant and cooperative with exam   3. Neurologic: Speech and language are normal, face is symmetric, moves all 4 extremities voluntarily, at baseline without acute deficits on limited exam   4. HEENMT:  Head is atraumatic, normocephalic, pupils reactive to light, neck is supple, trachea is midline, mucous membranes are moist   5. Respiratory : Lungs are clear to auscultation  bilaterally without wheezing, rhonchi, rales, no cyanosis, no increase in work of breathing or accessory muscle use   6. Cardiovascular : Heart rate normal, rhythm is regular, no murmurs, rubs or gallops, no peripheral edema, peripheral pulses palpated   7. Gastrointestinal:  Abdomen is soft, distended with fluid, nontender to palpation bowel sounds active, no masses or organomegaly palpated   8. Skin:  Skin is warm, dry and intact without rashes, acute lesions, or ulcers on limited exam   9.Musculoskeletal:  No acute deformities or trauma, no asymmetry in tone, no peripheral edema, peripheral pulses palpated, no tenderness to palpation in the extremities  Data Reviewed: In the ED Temp 98-90 8.1, heart rate 80-103, respiratory rate 17-22, blood pressure 129/94-151/112, satting 96-98% Leukocytosis at 12.2 likely related to pancreatitis, hemoglobin stable at 13.2 Chemistry is unremarkable Albumin is low at 2.8 Lipase is elevated 1123 UA is not indicative of UTI CT abdomen pelvis shows acute on chronic pancreatitis with mild increase in size of pseudocyst and pancreatic neck and pancreatic head since prior exam.  Moderate to large ascites.  Hepatic cirrhosis with findings of portal vein hypertension.  Patient has not had paracentesis before. He was given 2 mg of Dilaudid started on CIWA and given Zofran 8 mg total in the ED Admission requested for acute recurrent alcoholic pancreatitis Assessment and Plan: * Alcoholic pancreatitis -Acute pancreatitis with lipase nearly 1200 -Last RUQ was 2022, update RUQ [place] -Drinks 120 oz of beer per day  -NPO -Fluids -Pain control -Lipid panel in the AM -Continue to monitor  Alcohol abuse -counseled on importance of cessation -CIWA protocol -Continue to monitor  Tobacco abuse -Nicotine patch -Patient is not ready to quit -We discussed potential complications of smoking extensively, counseled on importance of cessation  GERD  (gastroesophageal reflux disease) -start Protonix       Advance Care Planning:   Code Status: Full Code  Consults: None at this time  Family Communication: Significant other at bedside  Severity of Illness: The appropriate patient status for this patient is INPATIENT. Inpatient status is judged to be reasonable and necessary in order to provide the required intensity of service to ensure the patient's safety. The patient's presenting symptoms, physical exam findings, and initial radiographic and laboratory data in the context of their chronic comorbidities is felt to place them at high risk for further clinical deterioration. Furthermore, it is not anticipated that the patient will be medically stable for discharge from the hospital within 2 midnights of admission.   * I certify that at the point of admission it is my clinical judgment that the patient will require inpatient hospital care spanning beyond 2 midnights from the point of admission due to high intensity of service, high risk  for further deterioration and high frequency of surveillance required.*  Author: Lilyan Gilford, DO 11/23/2022 5:31 AM  For on call review [email or web address].

## 2022-11-23 NOTE — Hospital Course (Addendum)
Luke Rogers. is a 62 y.o. male with medical history significant of alcohol abuse, GERD, hepatitis C, history of substance abuse, recurrent pancreatitis. To ED 11/22/22 w/ abd pain and bloating similar to previous pancreatitis episodes (last one was 05/2022). Typical EtOH use 3 tallboys beer per day, recently cut down to 2. (+)intermittent tremor.  05/02: admitted to hospitalist service for treatment pancreatitis. Pt is motivated to quit alcohol and treat withdrawals.  05/03: pain/lipase improving. Paracentesis removed 4L. No apparent SBP.  05/04: CIWA up. Minimal UOP, more confused/agitated, placed Foley. Transferred to stepdown for closer monitoring on phenobarbital.  05/05-05/06: remains in SDU on phenobarbital taper protocol.  05/07: quite agitated, requiring ativan + phenobarb but CIWA scores better on this. Paracentesis repeated removing 2L  05/08: lethargic, not taking po, NG placed.  05/09: CXR right interstitial lung opacities, concern for pneumonia/aspiration, requiring 5L O2 [place], ceftriaxone and azithro  05/10-05/12: more alert, but SLP still unable to work w/ him  05/13: SLP reeval - severe aspiration risk, continue NPO. Neurology consulted. MRI brain and EEG normal. Palliative consulted.  05/14: per neurology, anticipate will improve but this may be slow and may need prolonged nursing care.  05/15: still lethargic/confused. Overnight coughed up mass after he pulled NG tube, sent for pathology, NG unable to be replaced. D/w GI for PEG 05/16:  Family meeting today, see IPAL note.  12/08/22 (Fri): PEG tube placed. 05/18: confused but abd tender. CT abd/pelv - no acute findings or complications from recent procedure. PT/OT recs for SNF/STR.  05/19-05/20: placement pending which may be a challenge given significant intermittent confusion. Has not needed sitter but has needed mitts.  05/21: significant cognitive improvement today, carrying on coherent conversation, oriented to year,  place, person.    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  Thiamine   Alcoholic cirrhosis w/ ascites S/p Paracentesis x2 Low PMN, no apparent SBP MELD 12 = 6% 66-month mortality  Child-Pugh Child Class B = Indication for transplant evaluation Will need GI and portal HTN clinic w/ IR follow up outpatient  furosemide and spironolactone titrate as tolerated  EtOH abstinence encouraged  Repeat paracentesis as needed   Encephalopathy related dysphagia  Has pulled NG tube and this was unable to be re-placed Expect may need long-term tube feeds  Aspiration precautions  PEG feeds and meds SLP to follow - asked to reevaluate today given improvement   Acute hypoxic respiratory failure secondary to community-acquired pneumonia - resolved Abx have been d/c O2 supplementation as needed - has not been necessary   Delirium -  improved Ammonia was very slightly above WNL, but trended back down Given constipation, will give lactulose as needed Consider scheduled lactulose, will trend ammonia --> has been WNL Intermittently follow ammonium  Seroquel qhs  Pancreatic pseudocyst Repeat imaging  no concerns    Hypocalcemia Replacing Monitor   Hyponatremia Expect will be chronic given EtOH related liver disease  Monitor BMP  Hypomagnesemia  Replacing  Monitor  Alcohol abuse/dependence counseled on importance of cessation when he was lucid CIWA protocol completed Continue to monitor - per fiancee he is still asking for alcohol when delirious   GERD (gastroesophageal reflux disease) Hiatal hernia  PPI   Tobacco abuse Patient is not ready to quit (asked when lucid) Nicotine patch Counseled on potential complications of smoking extensively, counseled on importance of cessation  Constipation Bowel regimen / as needed  Alcoholic pancreatitis with lipase nearly 1200 on admission  resolved  Abdominal pain Concern for complication from PEG placement, possible SBP/ascites, other. CT showed no concerns. Pain appears resolved/intermittenr.  Monitor  Pain control   Expectoration of questionable lung/gastric mass 05/15 overnight  See photo in media Coughed this up after pulling NG tube Sent for pathology --> mucoid tissue w/ focal reactive squamous epithelium, no ciliated respiratory epithelium, likely upper aerodigestive tract origin, no malignancy    DVT prophylaxis: lovenox  Pertinent IV fluids/nutrition: tube feeds  Central lines / invasive devices: none  Code Status: FULL CODE ACP documentation reviewed: 05/02 none on file in [facility]    Current Admission Status: inpatient  TOC needs / Dispo plan: need SNF/STR  Barriers to discharge / significant pending items: placement

## 2022-11-23 NOTE — Progress Notes (Signed)
PT seen and examined this morning, plan same as w/ H&P. Pt has no further concerns at this time, confirms HPI as noted in H&P  Luke Rogers. is a 62 y.o. male with medical history significant of alcohol abuse, GERD, hepatitis C, history of substance abuse, recurrent pancreatitis. TO ED 11/22/22 w/ abd pain and bloating similar to previous pancreatitis episodes (last one was 05/2022). Typical EtOH use 3 tallboys beer per day, recently cut down to 2. (+)intermittent tremor.  05/02: admitted to hospitalist service for treatment pancreatitis, lipase nearly 1200. Following RUQ [place]. CIWA protocol, NPO and IV fluids, protonix. Pt is interested in quitting alcohol and treating withdrawals.   Consultants:  none  Procedures: none      ASSESSMENT & PLAN:   Principal Problem:   Alcoholic pancreatitis Active Problems:   Alcohol abuse   Tobacco abuse   GERD (gastroesophageal reflux disease)   Alcoholic pancreatitis Acute pancreatitis with lipase nearly 1200 update RUQ [place] NPO except ice ships and sips w/ meds  IV Fluids Pain control Lipid panel in the AM  Alcohol abuse/dependence counseled on importance of cessation CIWA protocol Continue to monitor  GERD (gastroesophageal reflux disease) Protonix   Tobacco abuse Patient is not ready to quit Nicotine patch Counseled on potential complications of smoking extensively, counseled on importance of cessation      DVT prophylaxis: lovenox  Pertinent IV fluids/nutrition: NS 150 mL/h, NPO  Central lines / invasive devices: none  Code Status: FULL CODE ACP documentation reviewed: 05/02 none on file in [facility]   Current Admission Status: inpatient  TOC needs / Dispo plan: none at this time, anticipate d/c back home when improved  Barriers to discharge / significant pending items: NPO, IV fluids, CIWA, expect will be here several more days

## 2022-11-23 NOTE — Assessment & Plan Note (Signed)
-  start Protonix

## 2022-11-23 NOTE — Assessment & Plan Note (Signed)
-  Nicotine patch -Patient is not ready to quit -We discussed potential complications of smoking extensively, counseled on importance of cessation

## 2022-11-23 NOTE — ED Notes (Signed)
PT brought to ed rm 18 at this time, this RN now assuming care.

## 2022-11-23 NOTE — Assessment & Plan Note (Signed)
-  counseled on importance of cessation -CIWA protocol -Continue to monitor

## 2022-11-24 ENCOUNTER — Inpatient Hospital Stay: Payer: Medicaid Other

## 2022-11-24 DIAGNOSIS — E440 Moderate protein-calorie malnutrition: Secondary | ICD-10-CM

## 2022-11-24 DIAGNOSIS — F17210 Nicotine dependence, cigarettes, uncomplicated: Secondary | ICD-10-CM

## 2022-11-24 LAB — CBC WITH DIFFERENTIAL/PLATELET
Abs Immature Granulocytes: 0.04 10*3/uL (ref 0.00–0.07)
Basophils Absolute: 0.1 10*3/uL (ref 0.0–0.1)
Basophils Relative: 1 %
Eosinophils Absolute: 0.4 10*3/uL (ref 0.0–0.5)
Eosinophils Relative: 3 %
HCT: 35.5 % — ABNORMAL LOW (ref 39.0–52.0)
Hemoglobin: 12.2 g/dL — ABNORMAL LOW (ref 13.0–17.0)
Immature Granulocytes: 0 %
Lymphocytes Relative: 24 %
Lymphs Abs: 2.8 10*3/uL (ref 0.7–4.0)
MCH: 33.1 pg (ref 26.0–34.0)
MCHC: 34.4 g/dL (ref 30.0–36.0)
MCV: 96.2 fL (ref 80.0–100.0)
Monocytes Absolute: 1.6 10*3/uL — ABNORMAL HIGH (ref 0.1–1.0)
Monocytes Relative: 13 %
Neutro Abs: 7 10*3/uL (ref 1.7–7.7)
Neutrophils Relative %: 59 %
Platelets: 188 10*3/uL (ref 150–400)
RBC: 3.69 MIL/uL — ABNORMAL LOW (ref 4.22–5.81)
RDW: 14.6 % (ref 11.5–15.5)
WBC: 11.9 10*3/uL — ABNORMAL HIGH (ref 4.0–10.5)
nRBC: 0 % (ref 0.0–0.2)

## 2022-11-24 LAB — PROTEIN, PLEURAL OR PERITONEAL FLUID: Total protein, fluid: 3.5 g/dL

## 2022-11-24 LAB — LIPID PANEL
Cholesterol: 80 mg/dL (ref 0–200)
HDL: 13 mg/dL — ABNORMAL LOW (ref >40)
LDL Cholesterol: 56 mg/dL (ref 0–99)
Total CHOL/HDL Ratio: 6.2 RATIO
Triglycerides: 57 mg/dL (ref <150)
VLDL: 11 mg/dL (ref 0–40)

## 2022-11-24 LAB — COMPREHENSIVE METABOLIC PANEL
ALT: 11 U/L (ref 0–44)
AST: 32 U/L (ref 15–41)
Albumin: 2.3 g/dL — ABNORMAL LOW (ref 3.5–5.0)
Alkaline Phosphatase: 71 U/L (ref 38–126)
Anion gap: 7 (ref 5–15)
BUN: 13 mg/dL (ref 6–20)
CO2: 24 mmol/L (ref 22–32)
Calcium: 7.5 mg/dL — ABNORMAL LOW (ref 8.9–10.3)
Chloride: 103 mmol/L (ref 98–111)
Creatinine, Ser: 0.6 mg/dL — ABNORMAL LOW (ref 0.61–1.24)
GFR, Estimated: >60 mL/min (ref >60)
Glucose, Bld: 91 mg/dL (ref 70–99)
Potassium: 3.8 mmol/L (ref 3.5–5.1)
Sodium: 134 mmol/L — ABNORMAL LOW (ref 135–145)
Total Bilirubin: 1.8 mg/dL — ABNORMAL HIGH (ref 0.3–1.2)
Total Protein: 6.9 g/dL (ref 6.5–8.1)

## 2022-11-24 LAB — LIPASE, BLOOD: Lipase: 223 U/L — ABNORMAL HIGH (ref 11–51)

## 2022-11-24 LAB — BODY FLUID CELL COUNT WITH DIFFERENTIAL
Eos, Fluid: 11 %
Lymphs, Fluid: 20 %
Monocyte-Macrophage-Serous Fluid: 58 %
Neutrophil Count, Fluid: 9 %
Other Cells, Fluid: 2 %
Total Nucleated Cell Count, Fluid: 777 cu mm

## 2022-11-24 LAB — PATHOLOGIST SMEAR REVIEW

## 2022-11-24 LAB — GLUCOSE, PLEURAL OR PERITONEAL FLUID: Glucose, Fluid: 85 mg/dL

## 2022-11-24 LAB — MAGNESIUM: Magnesium: 1.3 mg/dL — ABNORMAL LOW (ref 1.7–2.4)

## 2022-11-24 LAB — LACTATE DEHYDROGENASE, PLEURAL OR PERITONEAL FLUID: LD, Fluid: 113 U/L — ABNORMAL HIGH (ref 3–23)

## 2022-11-24 LAB — LACTATE DEHYDROGENASE: LDH: 134 U/L (ref 98–192)

## 2022-11-24 MED ORDER — LIDOCAINE HCL (PF) 1 % IJ SOLN
10.0000 mL | Freq: Once | INTRAMUSCULAR | Status: AC
  Administered 2022-11-24: 10 mL via INTRADERMAL
  Filled 2022-11-24: qty 10

## 2022-11-24 MED ORDER — MAGNESIUM SULFATE 2 GM/50ML IV SOLN
2.0000 g | Freq: Once | INTRAVENOUS | Status: AC
  Administered 2022-11-24: 2 g via INTRAVENOUS
  Filled 2022-11-24: qty 50

## 2022-11-24 NOTE — Procedures (Signed)
PROCEDURE SUMMARY:  Successful image-guided paracentesis from the right lower abdomen.  Yielded 4 liters of dark yellow fluid.  No immediate complications.  EBL = trace. Patient tolerated well.   Specimen was sent for labs.  Please see imaging section of Epic for full dictation.   Kennieth Francois PA-C 11/24/2022 3:47 PM

## 2022-11-24 NOTE — Progress Notes (Signed)
PROGRESS NOTE    Luke Rogers.   WUJ:[id number] DOB: Mar 17, 1961  DOA: 11/23/2022 Date of Service: 11/24/22 PCP: Patient, No Pcp Per     Brief Narrative / Hospital Course:  Luke Rogers. is a 62 y.o. male with medical history significant of alcohol abuse, GERD, hepatitis C, history of substance abuse, recurrent pancreatitis. To ED 11/22/22 w/ abd pain and bloating similar to previous pancreatitis episodes (last one was 05/2022). Typical EtOH use 3 tallboys beer per day, recently cut down to 2. (+)intermittent tremor.  05/02: admitted to hospitalist service for treatment pancreatitis, lipase nearly 1200. Following RUQ [place]. CIWA protocol, NPO and IV fluids, protonix. Pt is motivated to quit alcohol and treat withdrawals.  05/03: lipase down to 223. CIWA 1 this morning (+)tremor but improving later today. Paracentesis ordered.   Consultants:  none  Procedures: none  RUQ [place] 05/02 IMPRESSION: 1. No gallstones or findings to suggest an acute cholecystitis. 2. Advanced cirrhosis with ascites redemonstrated. 3. Large pancreatic pseudocyst again noted. 4. Small right pleural effusion.    ASSESSMENT & PLAN:   Principal Problem:   Alcoholic pancreatitis Active Problems:   Alcohol abuse   Tobacco abuse   GERD (gastroesophageal reflux disease)   Alcoholic pancreatitis Acute pancreatitis with lipase nearly 1200 NPO except ice chips and sips w/ meds  IV Fluids Pain control Lipid panel in the AM  Alcoholic cirrhosis w/ ascites Paracentesis ordered today Will need GI follow up outpatient   Pancreatic pseudocyst Repeat imaging if pain worsens   Hypocalcemia Check Ionized Ca and Mg   Hypomagnesemia  Repleting  Monitor  Alcohol abuse/dependence counseled on importance of cessation CIWA protocol Continue to monitor  GERD (gastroesophageal reflux disease) Protonix   Tobacco abuse Patient is not ready to quit Nicotine patch Counseled on potential  complications of smoking extensively, counseled on importance of cessation      DVT prophylaxis: lovenox  Pertinent IV fluids/nutrition: NS 150 mL/h, NPO --> CLD Central lines / invasive devices: none  Code Status: FULL CODE ACP documentation reviewed: 05/02 none on file in [facility]   Current Admission Status: inpatient  TOC needs / Dispo plan: none at this time, anticipate d/c back home when improved  Barriers to discharge / significant pending items: NPO --> CLD, IV fluids, CIWA, expect will be here several more days              Subjective / Brief ROS:  Patient reports abdominal discomfort worse from yesterday, constant, bloated Denies CP/SOB.  Pain controlled.  Denies new weakness. .  Reports no concerns w/ urination/defecation.   Family Communication: none at this time     Objective Findings:  Vitals:   11/23/22 1030 11/23/22 1142 11/23/22 1520 11/24/22 0732  BP: (!) 128/94 (!) 137/100 108/72 130/88  Pulse: (!) 102  (!) 105 97  Resp: 17 20 18 16   Temp:  98.7 F (37.1 C) 98.9 F (37.2 C) 98.8 F (37.1 C)  TempSrc:   Oral Oral  SpO2: 96% 95% 93% 94%  Weight:      Height:        Intake/Output Summary (Last 24 hours) at 11/24/2022 1411 Last data filed at 11/24/2022 0750 Gross per 24 hour  Intake 4376.43 ml  Output --  Net 4376.43 ml   Filed Weights   11/23/22 0802  Weight: 73.4 kg    Examination:  Physical Exam Constitutional:      General: He is not in acute distress. Cardiovascular:  Rate and Rhythm: Normal rate and regular rhythm.  Pulmonary:     Effort: Pulmonary effort is normal.     Breath sounds: Normal breath sounds.  Abdominal:     General: There is distension.     Palpations: There is fluid wave.     Tenderness: There is abdominal tenderness in the right lower quadrant.  Neurological:     General: No focal deficit present.     Mental Status: He is alert and oriented to person, place, and time.  Psychiatric:        Mood and  Affect: Mood normal.        Behavior: Behavior normal.          Scheduled Medications:   enoxaparin (LOVENOX) injection  40 mg Subcutaneous Q24H   LORazepam  0-4 mg Intravenous Q6H   Or   LORazepam  0-4 mg Oral Q6H   [START ON 11/25/2022] LORazepam  0-4 mg Intravenous Q12H   Or   [START ON 11/25/2022] LORazepam  0-4 mg Oral Q12H   nicotine  21 mg Transdermal Daily   pantoprazole (PROTONIX) IV  40 mg Intravenous Q24H   thiamine  100 mg Oral Daily   Or   thiamine  100 mg Intravenous Daily    Continuous Infusions:  sodium chloride 150 mL/hr at 11/24/22 0750    PRN Medications:  acetaminophen **OR** acetaminophen, morphine injection, ondansetron **OR** ondansetron (ZOFRAN) IV, oxyCODONE  Antimicrobials from admission:  Anti-infectives (From admission, onward)    None           Data Reviewed:  I have personally reviewed the following...  CBC: Recent Labs  Lab 11/22/22 1941 11/24/22 0007  WBC 12.2* 11.9*  NEUTROABS  --  7.0  HGB 13.2 12.2*  HCT 38.1* 35.5*  MCV 96.0 96.2  PLT 190 188   Basic Metabolic Panel: Recent Labs  Lab 11/22/22 1941 11/24/22 0007  NA 133* 134*  K 3.5 3.8  CL 99 103  CO2 23 24  GLUCOSE 126* 91  BUN 9 13  CREATININE 0.68 0.60*  CALCIUM 8.3* 7.5*  MG  --  1.3*   GFR: Estimated Creatinine Clearance: 103.2 mL/min (A) (by C-G formula based on SCr of 0.6 mg/dL (L)). Liver Function Tests: Recent Labs  Lab 11/22/22 1941 11/24/22 0007  AST 41 32  ALT 12 11  ALKPHOS 93 71  BILITOT 1.6* 1.8*  PROT 8.0 6.9  ALBUMIN 2.8* 2.3*   Recent Labs  Lab 11/22/22 1941 11/23/22 0540 11/24/22 0007  LIPASE 1,123* 410* 223*   No results for input(s): "AMMONIA" in the last 168 hours. Coagulation Profile: No results for input(s): "INR", "PROTIME" in the last 168 hours. Cardiac Enzymes: No results for input(s): "CKTOTAL", "CKMB", "CKMBINDEX", "TROPONINI" in the last 168 hours. BNP (last 3 results) No results for input(s): "PROBNP" in  the last 8760 hours. HbA1C: No results for input(s): "HGBA1C" in the last 72 hours. CBG: No results for input(s): "GLUCAP" in the last 168 hours. Lipid Profile: Recent Labs    11/24/22 0007  CHOL 80  HDL 13*  LDLCALC 56  TRIG 57  CHOLHDL 6.2   Thyroid Function Tests: No results for input(s): "TSH", "T4TOTAL", "FREET4", "T3FREE", "THYROIDAB" in the last 72 hours. Anemia Panel: No results for input(s): "VITAMINB12", "FOLATE", "FERRITIN", "TIBC", "IRON", "RETICCTPCT" in the last 72 hours. Most Recent Urinalysis On File:     Component Value Date/Time   COLORURINE AMBER (A) 11/22/2022 1941   APPEARANCEUR HAZY (A) 11/22/2022 1941  LABSPEC 1.027 11/22/2022 1941   PHURINE 5.0 11/22/2022 1941   GLUCOSEU NEGATIVE 11/22/2022 1941   HGBUR NEGATIVE 11/22/2022 1941   BILIRUBINUR NEGATIVE 11/22/2022 1941   KETONESUR NEGATIVE 11/22/2022 1941   PROTEINUR NEGATIVE 11/22/2022 1941   NITRITE NEGATIVE 11/22/2022 1941   LEUKOCYTESUR NEGATIVE 11/22/2022 1941   Sepsis Labs: @LABRCNTIP (procalcitonin:4,lacticidven:4) Microbiology: No results found for this or any previous visit (from the past 240 hour(s)).    Radiology Studies last 3 days: US Abdomen Limited RUQ (LIVER/GB)  Result Date: 11/23/2022 CLINICAL DATA:  62 year old male with history of alcoholic pancreatitis. EXAM: ULTRASOUND ABDOMEN LIMITED RIGHT UPPER QUADRANT COMPARISON:  No priors. FINDINGS: Gallbladder: No gallstones or wall thickening visualized. No sonographic Murphy sign noted by sonographer. Common bile duct: Diameter: 5.7 mm Liver: Liver has a nodular contour, indicative of underlying cirrhosis. No focal lesion identified. Within normal limits in parenchymal echogenicity. Portal vein is patent on color Doppler imaging with normal direction of blood flow towards the liver. Other: Ascites. Small right pleural effusion. Anechoic lesion with increased through transmission in the region of the proximal pancreatic body with some  dependent echogenic nonshadowing material (presumably internal debris), corresponding with large pancreatic pseudocyst noted on recent CT examination, estimated to measure 6.2 x 5.8 x 5.9 cm on today's study. IMPRESSION: 1. No gallstones or findings to suggest an acute cholecystitis. 2. Advanced cirrhosis with ascites redemonstrated. 3. Large pancreatic pseudocyst again noted. 4. Small right pleural effusion. Electronically Signed   By: Trudie Reed M.D.   On: 11/23/2022 06:26   CT ABDOMEN PELVIS W CONTRAST  Result Date: 11/22/2022 CLINICAL DATA:  Severe acute pancreatitis. Abdominal pain and bloating. Nausea and vomiting. EXAM: CT ABDOMEN AND PELVIS WITH CONTRAST TECHNIQUE: Multidetector CT imaging of the abdomen and pelvis was performed using the standard protocol following bolus administration of intravenous contrast. RADIATION DOSE REDUCTION: This exam was performed according to the departmental dose-optimization program which includes automated exposure control, adjustment of the mA and/or kV according to patient size and/or use of iterative reconstruction technique. CONTRAST:  OMNIPAQUE IOHEXOL 300 MG/ML  SOLN COMPARISON:  09/10/2022 FINDINGS: Lower Chest: No acute findings. Hepatobiliary: Capsular nodularity of the liver is consistent with cirrhosis. No hepatic masses are identified. Recanalization of paraumbilical veins again seen, consistent with portal venous hypertension. Gallbladder is unremarkable. No evidence of biliary ductal dilatation. Pancreas: Peripancreatic soft tissue stranding is again seen, greatest in the left upper quadrant, consistent with acute pancreatitis. Scattered pancreatic calcifications are consistent with chronic pancreatitis. A thin walled fluid collection is again seen in the pancreatic neck which measures 6.4 x 5.7 cm, mildly increased in size since previous study. Adjacent smaller low-attenuation lesion is seen in the pancreatic head which measures 2.3 x 1.5 cm,  also mildly increased since previous study. These are consistent with pancreatic pseudocysts. Spleen: Within normal limits in size and appearance. Adrenals/Urinary Tract: No suspicious masses identified. No evidence of ureteral calculi or hydronephrosis. Stomach/Bowel: Moderate size hiatal hernia is again seen which also contains some ascites. Wall thickening of the splenic flexure of the colon is again seen, which is reactive in etiology secondary to acute pancreatitis. No evidence of bowel obstruction or focal inflammatory process. Moderate to large amount of ascites has increased since prior exam. Vascular/Lymphatic: Significant change in shotty sub-centimeter porta hepatis lymph nodes, likely reactive in etiology. No pathologically enlarged lymph nodes. No acute vascular findings. Reproductive:  No mass or other significant abnormality. Other:  None. Musculoskeletal:  No suspicious bone lesions identified. IMPRESSION: Acute on  chronic pancreatitis, with mild increase in size of pseudocysts in the pancreatic neck and pancreatic head since prior exam. Stable wall thickening of the splenic flexure of the colon, which is reactive in etiology secondary to acute pancreatitis. Moderate to large amount of ascites, increased since prior exam. Hepatic cirrhosis and findings of portal venous hypertension. No evidence of hepatic neoplasm. Stable moderate hiatal hernia. Electronically Signed   By: Danae Orleans M.D.   On: 11/22/2022 21:59             LOS: 1 day     Sunnie Nielsen, DO [facility] 11/24/2022, 2:11 PM    Dictation software may have been used to generate the above note. Typos may occur and escape review in typed/dictated notes. Please contact Dr Lyn Hollingshead directly for clarity if needed.  Staff may message me via secure chat in Epic  but this may not receive an immediate response,  please page me for urgent matters!  If 7PM-7AM, please contact night  coverage [email or web address]

## 2022-11-24 NOTE — Progress Notes (Signed)
Initial Nutrition Assessment  DOCUMENTATION CODES:   Non-severe (moderate) malnutrition in context of chronic illness  INTERVENTION:   -RD will follow for diet advancement and add supplements as appropriate  NUTRITION DIAGNOSIS:   Moderate Malnutrition related to chronic illness (hepatitis C, alcohol abuse, pancreatitis) as evidenced by mild fat depletion, moderate fat depletion, mild muscle depletion, moderate muscle depletion.  GOAL:   Patient will meet greater than or equal to 90% of their needs  MONITOR:   PO intake, Supplement acceptance  REASON FOR ASSESSMENT:   Malnutrition Screening Tool    ASSESSMENT:   Pt with medical history significant of alcohol abuse, GERD, hepatitis C, history of substance abuse, recurrent pancreatitis, and more presents with a chief complaint of bloating.  Pt admitted with alcoholic pancreatitis.   Reviewed I/O's: +2.2 L x 24 hours  Spoke with pt at bedside, who reports feeling a little bit better today. He reports his medications are "kicking in" and this is helping with his pain and anxiety levels; he mostly complains of discomfort in his abdomen. Noted pt with ascites/ abdominal distention. Pt reports his last oral intake was 3 days PTA, stating he had difficulty keeping down even water. Due to abdomen distention, pt reports it has become more difficulty for him to eat, secondary to early satiety. Intake has worsened over the past 3 weeks, which pt shares is the last time he ate "normally".   Per MD notes, typical ETOH intake is 3 tall boys per day, but has been trying to limit to 2 per day.   Reviewed wt hx; pt has experienced a 4.8% wt loss over the past 2 months, which is not significant for time frame. Pt also with abdominal distention, which may be masking further weight loss. Per pt, he has progressively lost weight over the past 1.5 years. His UBW around 18%.   Plan to continue bowel rest for treatment of alcoholic pancreatitis. Pt  is understanding of this.   Medications reviewed and include ativan, thiamine, magnesium, and 0.9% sodium chloride infusion @ 150 ml/hr.   Labs reviewed: Na: 134, Mg: 1.3 (on IV supplementation), CBGS: 97.    NUTRITION - FOCUSED PHYSICAL EXAM:  Flowsheet Row Most Recent Value  Orbital Region Moderate depletion  Upper Arm Region Mild depletion  Thoracic and Lumbar Region Mild depletion  Buccal Region Mild depletion  Temple Region Moderate depletion  Clavicle Bone Region Moderate depletion  Clavicle and Acromion Bone Region Moderate depletion  Scapular Bone Region Moderate depletion  Dorsal Hand Mild depletion  Patellar Region Moderate depletion  Anterior Thigh Region Moderate depletion  Posterior Calf Region Moderate depletion  Edema (RD Assessment) None  Hair Reviewed  Eyes Reviewed  Mouth Reviewed  Skin Reviewed  Nails Reviewed       Diet Order:   Diet Order             Diet NPO time specified Except for: Ice Chips, Sips with Meds  Diet effective now                   EDUCATION NEEDS:   Education needs have been addressed  Skin:  Skin Assessment: Reviewed RN Assessment  Last BM:  Unknown  Height:   Ht Readings from Last 1 Encounters:  11/22/22 6\' 2"  (1.88 m)    Weight:   Wt Readings from Last 1 Encounters:  11/23/22 73.4 kg    Ideal Body Weight:  86.4 kg  BMI:  Body mass index is 20.78 kg/m.  Estimated Nutritional Needs:   Kcal:  2200-2400  Protein:  110-125 grams  Fluid:  > 2 L    Levada Schilling, RD, LDN, CDCES Registered Dietitian II Certified Diabetes Care and Education Specialist Please refer to [facility] for RD and/or RD on-call/weekend/after hours pager

## 2022-11-25 ENCOUNTER — Encounter: Payer: Self-pay | Admitting: Family Medicine

## 2022-11-25 LAB — COMPREHENSIVE METABOLIC PANEL
ALT: 14 U/L (ref 0–44)
AST: 42 U/L — ABNORMAL HIGH (ref 15–41)
Albumin: 2.1 g/dL — ABNORMAL LOW (ref 3.5–5.0)
Alkaline Phosphatase: 71 U/L (ref 38–126)
Anion gap: 12 (ref 5–15)
BUN: 9 mg/dL (ref 6–20)
CO2: 22 mmol/L (ref 22–32)
Calcium: 7.5 mg/dL — ABNORMAL LOW (ref 8.9–10.3)
Chloride: 101 mmol/L (ref 98–111)
Creatinine, Ser: 0.63 mg/dL (ref 0.61–1.24)
GFR, Estimated: >60 mL/min (ref >60)
Glucose, Bld: 97 mg/dL (ref 70–99)
Potassium: 3.8 mmol/L (ref 3.5–5.1)
Sodium: 135 mmol/L (ref 135–145)
Total Bilirubin: 2 mg/dL — ABNORMAL HIGH (ref 0.3–1.2)
Total Protein: 6.4 g/dL — ABNORMAL LOW (ref 6.5–8.1)

## 2022-11-25 LAB — CBC
HCT: 35.7 % — ABNORMAL LOW (ref 39.0–52.0)
Hemoglobin: 12 g/dL — ABNORMAL LOW (ref 13.0–17.0)
MCH: 32.7 pg (ref 26.0–34.0)
MCHC: 33.6 g/dL (ref 30.0–36.0)
MCV: 97.3 fL (ref 80.0–100.0)
Platelets: 196 10*3/uL (ref 150–400)
RBC: 3.67 MIL/uL — ABNORMAL LOW (ref 4.22–5.81)
RDW: 14.4 % (ref 11.5–15.5)
WBC: 11 10*3/uL — ABNORMAL HIGH (ref 4.0–10.5)
nRBC: 0 % (ref 0.0–0.2)

## 2022-11-25 LAB — GLUCOSE, CAPILLARY: Glucose-Capillary: 102 mg/dL — ABNORMAL HIGH (ref 70–99)

## 2022-11-25 LAB — PROTIME-INR
INR: 1.4 — ABNORMAL HIGH (ref 0.8–1.2)
Prothrombin Time: 17 seconds — ABNORMAL HIGH (ref 11.4–15.2)

## 2022-11-25 LAB — LIPASE, BLOOD: Lipase: 115 U/L — ABNORMAL HIGH (ref 11–51)

## 2022-11-25 LAB — MRSA NEXT GEN BY PCR, NASAL: MRSA by PCR Next Gen: NOT DETECTED

## 2022-11-25 MED ORDER — FUROSEMIDE 20 MG PO TABS
40.0000 mg | ORAL_TABLET | Freq: Every day | ORAL | Status: DC
  Administered 2022-11-25 – 2022-12-04 (×9): 40 mg via ORAL
  Filled 2022-11-25 (×6): qty 2
  Filled 2022-11-25: qty 1
  Filled 2022-11-25 (×4): qty 2

## 2022-11-25 MED ORDER — LORAZEPAM 2 MG/ML IJ SOLN: 1.0000 mg | Freq: Once | INTRAMUSCULAR | Status: DC

## 2022-11-25 MED ORDER — PHENOBARBITAL SODIUM 130 MG/ML IJ SOLN: 130.0000 mg | Freq: Once | INTRAMUSCULAR | Status: DC

## 2022-11-25 MED ORDER — PNEUMOCOCCAL 20-VAL CONJ VACC 0.5 ML IM SUSY: 0.5000 mL | PREFILLED_SYRINGE | INTRAMUSCULAR | Status: DC

## 2022-11-25 MED ORDER — LORAZEPAM 2 MG/ML IJ SOLN
1.0000 mg | INTRAMUSCULAR | Status: AC | PRN
  Administered 2022-11-25 (×2): 2 mg via INTRAVENOUS
  Administered 2022-11-25 – 2022-11-27 (×4): 4 mg via INTRAVENOUS
  Administered 2022-11-28 (×2): 2 mg via INTRAVENOUS
  Filled 2022-11-25: qty 1
  Filled 2022-11-25: qty 2
  Filled 2022-11-25: qty 1
  Filled 2022-11-25 (×6): qty 2

## 2022-11-25 MED ORDER — LORAZEPAM 2 MG/ML IJ SOLN
1.0000 mg | INTRAMUSCULAR | Status: DC | PRN
  Administered 2022-12-01 – 2022-12-09 (×7): 1 mg via INTRAVENOUS
  Filled 2022-11-25 (×8): qty 1

## 2022-11-25 MED ORDER — ADULT MULTIVITAMIN W/MINERALS CH
1.0000 | ORAL_TABLET | Freq: Every day | ORAL | Status: DC
  Administered 2022-11-27 – 2022-12-04 (×7): 1 via ORAL
  Filled 2022-11-25 (×9): qty 1

## 2022-11-25 MED ORDER — LORAZEPAM 2 MG/ML IJ SOLN
0.0000 mg | Freq: Four times a day (QID) | INTRAMUSCULAR | Status: AC
  Administered 2022-11-25 – 2022-11-26 (×7): 4 mg via INTRAVENOUS
  Filled 2022-11-25 (×7): qty 2

## 2022-11-25 MED ORDER — LORAZEPAM 1 MG PO TABS
1.0000 mg | ORAL_TABLET | ORAL | Status: AC | PRN
  Filled 2022-11-25: qty 4

## 2022-11-25 MED ORDER — SPIRONOLACTONE 12.5 MG HALF TABLET
12.5000 mg | ORAL_TABLET | Freq: Two times a day (BID) | ORAL | Status: DC
  Administered 2022-11-25 – 2022-12-04 (×14): 12.5 mg via ORAL
  Filled 2022-11-25 (×20): qty 1

## 2022-11-25 MED ORDER — LORAZEPAM 2 MG/ML IJ SOLN: 3.0000 mg | INTRAMUSCULAR | Status: DC

## 2022-11-25 MED ORDER — LORAZEPAM 2 MG/ML IJ SOLN
0.0000 mg | Freq: Two times a day (BID) | INTRAMUSCULAR | Status: AC
  Administered 2022-11-27: 4 mg via INTRAVENOUS
  Administered 2022-11-27 – 2022-11-28 (×2): 2 mg via INTRAVENOUS
  Filled 2022-11-25 (×3): qty 1
  Filled 2022-11-25: qty 2

## 2022-11-25 MED ORDER — PHENOBARBITAL SODIUM 65 MG/ML IJ SOLN
65.0000 mg | Freq: Three times a day (TID) | INTRAMUSCULAR | Status: AC
  Administered 2022-11-27 – 2022-11-29 (×5): 65 mg via INTRAVENOUS
  Filled 2022-11-25 (×6): qty 1

## 2022-11-25 MED ORDER — LORAZEPAM 2 MG/ML IJ SOLN
2.0000 mg | Freq: Once | INTRAMUSCULAR | Status: AC
  Administered 2022-11-25: 2 mg via INTRAVENOUS
  Filled 2022-11-25: qty 1

## 2022-11-25 MED ORDER — DEXMEDETOMIDINE HCL IN NACL 200 MCG/50ML IV SOLN: 0.0000 ug/kg/h | INTRAVENOUS | Status: DC

## 2022-11-25 MED ORDER — PHENOBARBITAL SODIUM 65 MG/ML IJ SOLN
32.5000 mg | Freq: Three times a day (TID) | INTRAMUSCULAR | Status: DC
  Administered 2022-11-29 – 2022-12-01 (×4): 32.5 mg via INTRAVENOUS
  Filled 2022-11-25 (×4): qty 1

## 2022-11-25 MED ORDER — PHENOBARBITAL SODIUM 130 MG/ML IJ SOLN
97.5000 mg | Freq: Three times a day (TID) | INTRAMUSCULAR | Status: AC
  Administered 2022-11-25 – 2022-11-27 (×6): 97.5 mg via INTRAVENOUS
  Filled 2022-11-25 (×6): qty 1

## 2022-11-25 MED ORDER — CHLORHEXIDINE GLUCONATE CLOTH 2 % EX PADS
6.0000 | MEDICATED_PAD | Freq: Every day | CUTANEOUS | Status: DC
  Administered 2022-11-26 – 2022-12-06 (×11): 6 via TOPICAL

## 2022-11-25 MED ORDER — SODIUM CHLORIDE 0.9 % IV SOLN
260.0000 mg | Freq: Once | INTRAVENOUS | Status: AC
  Administered 2022-11-25: 260 mg via INTRAVENOUS
  Filled 2022-11-25: qty 2

## 2022-11-25 MED ORDER — LORAZEPAM 2 MG/ML IJ SOLN
4.0000 mg | INTRAMUSCULAR | Status: AC
  Administered 2022-11-25: 4 mg via INTRAVENOUS
  Filled 2022-11-25: qty 2

## 2022-11-25 MED ORDER — LORAZEPAM 2 MG/ML IJ SOLN
1.0000 mg | Freq: Once | INTRAMUSCULAR | Status: AC
  Administered 2022-11-25: 1 mg via INTRAVENOUS
  Filled 2022-11-25: qty 1

## 2022-11-25 NOTE — Progress Notes (Signed)
Luke Asters NP notified patient trying to still gettingup, agitated. In to see patient.

## 2022-11-25 NOTE — Progress Notes (Signed)
       CROSS COVER NOTE  NAME: Luke Rogers. MRN: [id number] DOB : 19-Feb-1961    HPI/Events of Note   Report:combative agitated anxious  On review of chart:alcohol withdrawal    Assessment and  Interventions   Assessment: obvious tremors cofnusion Plan: Ciwa protocol restarted every 6 with ativan redosing every hour as needed 1:1 sitter      Donnie Mesa NP [facility]

## 2022-11-25 NOTE — Plan of Care (Signed)

## 2022-11-25 NOTE — Progress Notes (Signed)
Luke Rogers notified pt still attempting to get out of bed. Orders given

## 2022-11-25 NOTE — Progress Notes (Addendum)
PROGRESS NOTE    Luke Rogers.   ZOX:[id number] DOB: May 16, 1961  DOA: 11/23/2022 Date of Service: 11/25/22 PCP: Patient, No Pcp Per     Brief Narrative / Hospital Course:  Luke Rogers. is a 62 y.o. male with medical history significant of alcohol abuse, GERD, hepatitis C, history of substance abuse, recurrent pancreatitis. To ED 11/22/22 w/ abd pain and bloating similar to previous pancreatitis episodes (last one was 05/2022). Typical EtOH use 3 tallboys beer per day, recently cut down to 2. (+)intermittent tremor.  05/02: admitted to hospitalist service for treatment pancreatitis, lipase nearly 1200. Following RUQ [place]. CIWA protocol, NPO and IV fluids, protonix. Pt is motivated to quit alcohol and treat withdrawals.  05/03: lipase down to 223. CIWA 1 this morning (+)tremor but improving later today. Paracentesis removed 4L. No apparent SBP.  05/04: CIWA up, valium requirement havs increased. Starting lasix + spiro. Minimal UOP, more confused/agitated, placed Foley. Transferred to stepdown for closer monitoring on phenobarbital.   Consultants:  none  Procedures: 11/24/22 US Paracentesis --> 4L  RUQ [place] 05/02 IMPRESSION: 1. No gallstones or findings to suggest an acute cholecystitis. 2. Advanced cirrhosis with ascites redemonstrated. 3. Large pancreatic pseudocyst again noted. 4. Small right pleural effusion.    ASSESSMENT & PLAN:   Principal Problem:   Alcoholic pancreatitis Active Problems:   Alcohol abuse   Tobacco abuse   GERD (gastroesophageal reflux disease)   Alcoholic pancreatitis Acute pancreatitis with lipase nearly 1200 Trial CLD Continue IV Fluids Pain control  Alcohol withdrawal Delirium Tremens Continue benzodiazepine --> phenobarbital in stepdown unit  Alcoholic cirrhosis w/ ascites S/p Paracentesis  Low PMN, no apparent SBP MELD 12 = 6% 29-month mortality  Child-Pugh Child Class B = Indication for transplant evaluation Will need GI  follow up outpatient  Starting furosemide and spironolactone EtOH abstinence   Pancreatic pseudocyst Repeat imaging if pain worsens   Hypocalcemia Check Ionized Ca and Mg   Hypomagnesemia  Repleting  Monitor  Alcohol abuse/dependence counseled on importance of cessation CIWA protocol Continue to monitor  GERD (gastroesophageal reflux disease) Protonix   Tobacco abuse Patient is not ready to quit Nicotine patch Counseled on potential complications of smoking extensively, counseled on importance of cessation      DVT prophylaxis: lovenox  Pertinent IV fluids/nutrition: CLD as able, stopped IV fluids  Central lines / invasive devices: none  Code Status: FULL CODE ACP documentation reviewed: 05/02 none on file in [facility]   Current Admission Status: inpatient  TOC needs / Dispo plan: none at this time, anticipate d/c back home when improved  Barriers to discharge / significant pending items: NPO --> CLD, IV fluids, CIWA, expect will be here several more days              Subjective / Brief ROS:  Patient unable to participate in ROS. He is sleeping in AM, but awakens to voice. Later afternoon, he is more alert, following basic commands but very confused.   Family Communication: none at this time     Objective Findings:  Vitals:   11/25/22 1158 11/25/22 1424 11/25/22 1459 11/25/22 1540  BP: 120/82 121/75  121/78  Pulse: (!) 101 (!) 105  (!) 102  Resp:  (!) 22 20   Temp:  99.2 F (37.3 C)    TempSrc:  Axillary    SpO2:  96%    Weight:      Height:        Intake/Output Summary (Last 24 hours)  at 11/25/2022 1605 Last data filed at 11/25/2022 1404 Gross per 24 hour  Intake 2882.91 ml  Output 1125 ml  Net 1757.91 ml   Filed Weights   11/23/22 0802  Weight: 73.4 kg    Examination:  Physical Exam Constitutional:      General: He is not in acute distress. Cardiovascular:     Rate and Rhythm: Normal rate and regular rhythm.  Pulmonary:      Effort: Pulmonary effort is normal.     Breath sounds: Normal breath sounds.  Abdominal:     General: There is distension.  Neurological:     General: No focal deficit present.     Mental Status: He is alert. He is disoriented.          Scheduled Medications:   enoxaparin (LOVENOX) injection  40 mg Subcutaneous Q24H   furosemide  40 mg Oral Daily   LORazepam  0-4 mg Intravenous Q6H   Followed by   Melene Muller ON 11/27/2022] LORazepam  0-4 mg Intravenous Q12H   multivitamin with minerals  1 tablet Oral Daily   nicotine  21 mg Transdermal Daily   pantoprazole (PROTONIX) IV  40 mg Intravenous Q24H   PHENObarbital  130 mg Intravenous Once   [START ON 11/26/2022] pneumococcal 20-valent conjugate vaccine  0.5 mL Intramuscular Tomorrow-1000   spironolactone  12.5 mg Oral BID   thiamine  100 mg Oral Daily   Or   thiamine  100 mg Intravenous Daily    Continuous Infusions:    PRN Medications:  acetaminophen **OR** acetaminophen, LORazepam **OR** LORazepam, morphine injection, ondansetron **OR** ondansetron (ZOFRAN) IV, oxyCODONE  Antimicrobials from admission:  Anti-infectives (From admission, onward)    None           Data Reviewed:  I have personally reviewed the following...  CBC: Recent Labs  Lab 11/22/22 1941 11/24/22 0007 11/25/22 0733  WBC 12.2* 11.9* 11.0*  NEUTROABS  --  7.0  --   HGB 13.2 12.2* 12.0*  HCT 38.1* 35.5* 35.7*  MCV 96.0 96.2 97.3  PLT 190 188 196   Basic Metabolic Panel: Recent Labs  Lab 11/22/22 1941 11/24/22 0007 11/25/22 0733  NA 133* 134* 135  K 3.5 3.8 3.8  CL 99 103 101  CO2 23 24 22   GLUCOSE 126* 91 97  BUN 9 13 9   CREATININE 0.68 0.60* 0.63  CALCIUM 8.3* 7.5* 7.5*  MG  --  1.3*  --    GFR: Estimated Creatinine Clearance: 103.2 mL/min (by C-G formula based on SCr of 0.63 mg/dL). Liver Function Tests: Recent Labs  Lab 11/22/22 1941 11/24/22 0007 11/25/22 0733  AST 41 32 42*  ALT 12 11 14   ALKPHOS 93 71 71  BILITOT  1.6* 1.8* 2.0*  PROT 8.0 6.9 6.4*  ALBUMIN 2.8* 2.3* 2.1*   Recent Labs  Lab 11/22/22 1941 11/23/22 0540 11/24/22 0007 11/25/22 0733  LIPASE 1,123* 410* 223* 115*   No results for input(s): "AMMONIA" in the last 168 hours. Coagulation Profile: Recent Labs  Lab 11/25/22 0810  INR 1.4*   Cardiac Enzymes: No results for input(s): "CKTOTAL", "CKMB", "CKMBINDEX", "TROPONINI" in the last 168 hours. BNP (last 3 results) No results for input(s): "PROBNP" in the last 8760 hours. HbA1C: No results for input(s): "HGBA1C" in the last 72 hours. CBG: No results for input(s): "GLUCAP" in the last 168 hours. Lipid Profile: Recent Labs    11/24/22 0007  CHOL 80  HDL 13*  LDLCALC 56  TRIG 57  CHOLHDL  6.2   Thyroid Function Tests: No results for input(s): "TSH", "T4TOTAL", "FREET4", "T3FREE", "THYROIDAB" in the last 72 hours. Anemia Panel: No results for input(s): "VITAMINB12", "FOLATE", "FERRITIN", "TIBC", "IRON", "RETICCTPCT" in the last 72 hours. Most Recent Urinalysis On File:     Component Value Date/Time   COLORURINE AMBER (A) 11/22/2022 1941   APPEARANCEUR HAZY (A) 11/22/2022 1941   LABSPEC 1.027 11/22/2022 1941   PHURINE 5.0 11/22/2022 1941   GLUCOSEU NEGATIVE 11/22/2022 1941   HGBUR NEGATIVE 11/22/2022 1941   BILIRUBINUR NEGATIVE 11/22/2022 1941   KETONESUR NEGATIVE 11/22/2022 1941   PROTEINUR NEGATIVE 11/22/2022 1941   NITRITE NEGATIVE 11/22/2022 1941   LEUKOCYTESUR NEGATIVE 11/22/2022 1941   Sepsis Labs: @LABRCNTIP (procalcitonin:4,lacticidven:4) Microbiology: No results found for this or any previous visit (from the past 240 hour(s)).    Radiology Studies last 3 days: US Paracentesis  Result Date: 11/24/2022 INDICATION: Ascites; history of alcoholic pancreatitis. Ultrasound performed 11/23/2022 reveals advanced cirrhosis with ascites. EXAM: ULTRASOUND GUIDED diagnostic and therapeutic PARACENTESIS MEDICATIONS: 6 cc 1% lidocaine COMPLICATIONS: None immediate.  PROCEDURE: Informed written consent was obtained from the patient after a discussion of the risks, benefits and alternatives to treatment. A timeout was performed prior to the initiation of the procedure. Initial ultrasound scanning demonstrates a large amount of ascites within the right lower abdominal quadrant. The right lower abdomen was prepped and draped in the usual sterile fashion. 1% lidocaine was used for local anesthesia. Following this, a 19 gauge, 7-cm, Yueh catheter was introduced. An ultrasound image was saved for documentation purposes. The paracentesis was performed. The catheter was removed and a dressing was applied. The patient tolerated the procedure well without immediate post procedural complication. FINDINGS: A total of approximately 4 L of dark yellow fluid was removed. Samples were sent to the laboratory as requested by the clinical team. IMPRESSION: Successful ultrasound-guided paracentesis yielding 4 liters of peritoneal fluid. PLAN: If the patient eventually requires >/=2 paracenteses in a 30 day period, candidacy for formal evaluation by the [facility] will be assessed. Electronically Signed   By: Olive Bass M.D.   On: 11/24/2022 16:10   US Abdomen Limited RUQ (LIVER/GB)  Result Date: 11/23/2022 CLINICAL DATA:  62 year old male with history of alcoholic pancreatitis. EXAM: ULTRASOUND ABDOMEN LIMITED RIGHT UPPER QUADRANT COMPARISON:  No priors. FINDINGS: Gallbladder: No gallstones or wall thickening visualized. No sonographic Murphy sign noted by sonographer. Common bile duct: Diameter: 5.7 mm Liver: Liver has a nodular contour, indicative of underlying cirrhosis. No focal lesion identified. Within normal limits in parenchymal echogenicity. Portal vein is patent on color Doppler imaging with normal direction of blood flow towards the liver. Other: Ascites. Small right pleural effusion. Anechoic lesion with increased through  transmission in the region of the proximal pancreatic body with some dependent echogenic nonshadowing material (presumably internal debris), corresponding with large pancreatic pseudocyst noted on recent CT examination, estimated to measure 6.2 x 5.8 x 5.9 cm on today's study. IMPRESSION: 1. No gallstones or findings to suggest an acute cholecystitis. 2. Advanced cirrhosis with ascites redemonstrated. 3. Large pancreatic pseudocyst again noted. 4. Small right pleural effusion. Electronically Signed   By: Trudie Reed M.D.   On: 11/23/2022 06:26   CT ABDOMEN PELVIS W CONTRAST  Result Date: 11/22/2022 CLINICAL DATA:  Severe acute pancreatitis. Abdominal pain and bloating. Nausea and vomiting. EXAM: CT ABDOMEN AND PELVIS WITH CONTRAST TECHNIQUE: Multidetector CT imaging of the abdomen and pelvis was performed using the standard protocol following bolus administration of  intravenous contrast. RADIATION DOSE REDUCTION: This exam was performed according to the departmental dose-optimization program which includes automated exposure control, adjustment of the mA and/or kV according to patient size and/or use of iterative reconstruction technique. CONTRAST:  OMNIPAQUE IOHEXOL 300 MG/ML  SOLN COMPARISON:  09/10/2022 FINDINGS: Lower Chest: No acute findings. Hepatobiliary: Capsular nodularity of the liver is consistent with cirrhosis. No hepatic masses are identified. Recanalization of paraumbilical veins again seen, consistent with portal venous hypertension. Gallbladder is unremarkable. No evidence of biliary ductal dilatation. Pancreas: Peripancreatic soft tissue stranding is again seen, greatest in the left upper quadrant, consistent with acute pancreatitis. Scattered pancreatic calcifications are consistent with chronic pancreatitis. A thin walled fluid collection is again seen in the pancreatic neck which measures 6.4 x 5.7 cm, mildly increased in size since previous study. Adjacent smaller low-attenuation  lesion is seen in the pancreatic head which measures 2.3 x 1.5 cm, also mildly increased since previous study. These are consistent with pancreatic pseudocysts. Spleen: Within normal limits in size and appearance. Adrenals/Urinary Tract: No suspicious masses identified. No evidence of ureteral calculi or hydronephrosis. Stomach/Bowel: Moderate size hiatal hernia is again seen which also contains some ascites. Wall thickening of the splenic flexure of the colon is again seen, which is reactive in etiology secondary to acute pancreatitis. No evidence of bowel obstruction or focal inflammatory process. Moderate to large amount of ascites has increased since prior exam. Vascular/Lymphatic: Significant change in shotty sub-centimeter porta hepatis lymph nodes, likely reactive in etiology. No pathologically enlarged lymph nodes. No acute vascular findings. Reproductive:  No mass or other significant abnormality. Other:  None. Musculoskeletal:  No suspicious bone lesions identified. IMPRESSION: Acute on chronic pancreatitis, with mild increase in size of pseudocysts in the pancreatic neck and pancreatic head since prior exam. Stable wall thickening of the splenic flexure of the colon, which is reactive in etiology secondary to acute pancreatitis. Moderate to large amount of ascites, increased since prior exam. Hepatic cirrhosis and findings of portal venous hypertension. No evidence of hepatic neoplasm. Stable moderate hiatal hernia. Electronically Signed   By: Danae Orleans M.D.   On: 11/22/2022 21:59             LOS: 2 days     Sunnie Nielsen, DO [facility] 11/25/2022, 4:05 PM    Dictation software may have been used to generate the above note. Typos may occur and escape review in typed/dictated notes. Please contact Dr Lyn Hollingshead directly for clarity if needed.  Staff may message me via secure chat in Epic  but this may not receive an immediate response,  please page me for urgent  matters!  If 7PM-7AM, please contact night coverage [email or web address]

## 2022-11-25 NOTE — Progress Notes (Signed)
Pt removed IV, stated he is leaving. He states he doesn't know where he is, states I am not replacing Iv. BMorrison notified ordered mg ativan to be given.

## 2022-11-25 NOTE — Progress Notes (Addendum)
1700 Received patient from room 204 2C. Patient noted to have tremors and alert to self only. Puncture area LL abdomen from paracentesis 1720 Wife in to visit patient.

## 2022-11-26 LAB — CALCIUM, IONIZED: Calcium, Ionized, Serum: 4.5 mg/dL (ref 4.5–5.6)

## 2022-11-26 MED ORDER — ORAL CARE MOUTH RINSE: 15.0000 mL | OROMUCOSAL | Status: DC | PRN

## 2022-11-26 NOTE — Progress Notes (Signed)
PROGRESS NOTE    Luke Rogers.   EAV:[id number] DOB: 25-Sep-1960  DOA: 11/23/2022 Date of Service: 11/26/22 PCP: Patient, No Pcp Per     Brief Narrative / Hospital Course:  Luke Rogers. is a 62 y.o. male with medical history significant of alcohol abuse, GERD, hepatitis C, history of substance abuse, recurrent pancreatitis. To ED 11/22/22 w/ abd pain and bloating similar to previous pancreatitis episodes (last one was 05/2022). Typical EtOH use 3 tallboys beer per day, recently cut down to 2. (+)intermittent tremor.  05/02: admitted to hospitalist service for treatment pancreatitis, lipase nearly 1200. Following RUQ [place]. CIWA protocol, NPO and IV fluids, protonix. Pt is motivated to quit alcohol and treat withdrawals.  05/03: lipase down to 223. CIWA 1 this morning (+)tremor but improving later today. Paracentesis removed 4L. No apparent SBP.  05/04: CIWA up, valium requirement havs increased. Starting lasix + spiro. Minimal UOP, more confused/agitated, placed Foley. Transferred to stepdown for closer monitoring on phenobarbital.  05/05: remains in SDU on phenobarbital taper protocol.   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 = Indication for transplant evaluation Will need GI follow up outpatient  Starting furosemide and spironolactone EtOH abstinence  Expect may need to repeat paracentesis tomorrow w/ IR (no non-emergent IR coverage  over the weekend)  Pancreatic pseudocyst Repeat imaging if pain worsens   Hypocalcemia Check Ionized Ca and Mg   Hypomagnesemia  Repleting  Monitor  Alcohol abuse/dependence counseled on importance of cessation CIWA protocol Continue to monitor  GERD (gastroesophageal reflux disease) Protonix   Tobacco abuse Patient is not ready to quit Nicotine patch Counseled on potential complications of smoking extensively, counseled on importance of cessation      DVT prophylaxis: lovenox  Pertinent IV fluids/nutrition: CLD as able, stopped IV fluids  Central lines / invasive devices: none  Code Status: FULL CODE ACP documentation reviewed: 05/02 none on file in [facility]   Current Admission Status: inpatient  TOC needs / Dispo plan: none at this time, anticipate d/c back home when improved  Barriers to discharge / significant pending items: NPO --> CLD, IV fluids, CIWA, expect will be here several more days              Subjective / Brief ROS:  Patient unable to participate in ROS. He is alert, confused. He is able to follow basic commands but not talking   Family Communication: finacee is at bedside on rounds     Objective Findings:  Vitals:   11/26/22 1000 11/26/22 1100 11/26/22 1200 11/26/22 1300  BP: 121/81 124/74 115/80 (!) 93/58  Pulse: 100 (!) 107  84  Resp: (!) 30 (!) 21 20   Temp:   98.7 F (37.1 C)   TempSrc:      SpO2: 92% 92% 99% 99%  Weight:      Height:        Intake/Output Summary (Last 24 hours) at 11/26/2022 1429 Last data filed at 11/26/2022 7829 Gross  per 24 hour  Intake --  Output 585 ml  Net -585 ml   Filed Weights   11/23/22 0802 11/25/22 1645  Weight: 73.4 kg 79.9 kg    Examination:  Physical Exam Constitutional:      General: He is not in acute distress. Cardiovascular:     Rate and Rhythm: Normal rate and regular rhythm.  Pulmonary:     Effort: Pulmonary effort is normal.     Breath sounds: Normal breath sounds.   Abdominal:     General: There is distension.  Skin:    General: Skin is warm and dry.  Neurological:     General: No focal deficit present.     Mental Status: He is alert. He is disoriented.          Scheduled Medications:   Chlorhexidine Gluconate Cloth  6 each Topical Q0600   enoxaparin (LOVENOX) injection  40 mg Subcutaneous Q24H   furosemide  40 mg Oral Daily   LORazepam  0-4 mg Intravenous Q6H   Followed by   Melene Muller ON 11/27/2022] LORazepam  0-4 mg Intravenous Q12H   multivitamin with minerals  1 tablet Oral Daily   nicotine  21 mg Transdermal Daily   pantoprazole (PROTONIX) IV  40 mg Intravenous Q24H   PHENObarbital  97.5 mg Intravenous Q8H   Followed by   Melene Muller ON 11/27/2022] PHENObarbital  65 mg Intravenous Q8H   Followed by   Melene Muller ON 11/29/2022] PHENObarbital  32.5 mg Intravenous Q8H   pneumococcal 20-valent conjugate vaccine  0.5 mL Intramuscular Tomorrow-1000   spironolactone  12.5 mg Oral BID   thiamine  100 mg Oral Daily   Or   thiamine  100 mg Intravenous Daily    Continuous Infusions:    PRN Medications:  acetaminophen **OR** acetaminophen, LORazepam, LORazepam **OR** LORazepam, morphine injection, ondansetron **OR** ondansetron (ZOFRAN) IV, mouth rinse, oxyCODONE  Antimicrobials from admission:  Anti-infectives (From admission, onward)    None           Data Reviewed:  I have personally reviewed the following...  CBC: Recent Labs  Lab 11/22/22 1941 11/24/22 0007 11/25/22 0733  WBC 12.2* 11.9* 11.0*  NEUTROABS  --  7.0  --   HGB 13.2 12.2* 12.0*  HCT 38.1* 35.5* 35.7*  MCV 96.0 96.2 97.3  PLT 190 188 196   Basic Metabolic Panel: Recent Labs  Lab 11/22/22 1941 11/24/22 0007 11/25/22 0733  NA 133* 134* 135  K 3.5 3.8 3.8  CL 99 103 101  CO2 23 24 22   GLUCOSE 126* 91 97  BUN 9 13 9   CREATININE 0.68 0.60* 0.63  CALCIUM 8.3* 7.5* 7.5*  MG  --  1.3*  --    GFR: Estimated Creatinine Clearance: 112.4 mL/min (by C-G formula  based on SCr of 0.63 mg/dL). Liver Function Tests: Recent Labs  Lab 11/22/22 1941 11/24/22 0007 11/25/22 0733  AST 41 32 42*  ALT 12 11 14   ALKPHOS 93 71 71  BILITOT 1.6* 1.8* 2.0*  PROT 8.0 6.9 6.4*  ALBUMIN 2.8* 2.3* 2.1*   Recent Labs  Lab 11/22/22 1941 11/23/22 0540 11/24/22 0007 11/25/22 0733  LIPASE 1,123* 410* 223* 115*   No results for input(s): "AMMONIA" in the last 168 hours. Coagulation Profile: Recent Labs  Lab 11/25/22 0810  INR 1.4*   Cardiac Enzymes: No results for input(s): "CKTOTAL", "CKMB", "CKMBINDEX", "TROPONINI" in the last 168 hours. BNP (last 3 results) No results for input(s): "PROBNP" in the last 8760 hours. HbA1C:  No results for input(s): "HGBA1C" in the last 72 hours. CBG: Recent Labs  Lab 11/25/22 1649  GLUCAP 102*   Lipid Profile: Recent Labs    11/24/22 0007  CHOL 80  HDL 13*  LDLCALC 56  TRIG 57  CHOLHDL 6.2   Thyroid Function Tests: No results for input(s): "TSH", "T4TOTAL", "FREET4", "T3FREE", "THYROIDAB" in the last 72 hours. Anemia Panel: No results for input(s): "VITAMINB12", "FOLATE", "FERRITIN", "TIBC", "IRON", "RETICCTPCT" in the last 72 hours. Most Recent Urinalysis On File:     Component Value Date/Time   COLORURINE AMBER (A) 11/22/2022 1941   APPEARANCEUR HAZY (A) 11/22/2022 1941   LABSPEC 1.027 11/22/2022 1941   PHURINE 5.0 11/22/2022 1941   GLUCOSEU NEGATIVE 11/22/2022 1941   HGBUR NEGATIVE 11/22/2022 1941   BILIRUBINUR NEGATIVE 11/22/2022 1941   KETONESUR NEGATIVE 11/22/2022 1941   PROTEINUR NEGATIVE 11/22/2022 1941   NITRITE NEGATIVE 11/22/2022 1941   LEUKOCYTESUR NEGATIVE 11/22/2022 1941   Sepsis Labs: @LABRCNTIP (procalcitonin:4,lacticidven:4) Microbiology: Recent Results (from the past 240 hour(s))  MRSA Next Gen by PCR, Nasal     Status: None   Collection Time: 11/25/22  4:45 PM   Specimen: Nasal Mucosa; Nasal Swab  Result Value Ref Range Status   MRSA by PCR Next Gen NOT DETECTED NOT  DETECTED Final    Comment: (NOTE) The GeneXpert MRSA Assay (FDA approved for NASAL specimens only), is one component of a comprehensive MRSA colonization surveillance program. It is not intended to diagnose MRSA infection nor to guide or monitor treatment for MRSA infections. Test performance is not FDA approved in patients less than 76 years old. Performed at [facility], [address] [place], [place] [address]       Radiology Studies last 3 days: US Paracentesis  Result Date: 11/24/2022 INDICATION: Ascites; history of alcoholic pancreatitis. Ultrasound performed 11/23/2022 reveals advanced cirrhosis with ascites. EXAM: ULTRASOUND GUIDED diagnostic and therapeutic PARACENTESIS MEDICATIONS: 6 cc 1% lidocaine COMPLICATIONS: None immediate. PROCEDURE: Informed written consent was obtained from the patient after a discussion of the risks, benefits and alternatives to treatment. A timeout was performed prior to the initiation of the procedure. Initial ultrasound scanning demonstrates a large amount of ascites within the right lower abdominal quadrant. The right lower abdomen was prepped and draped in the usual sterile fashion. 1% lidocaine was used for local anesthesia. Following this, a 19 gauge, 7-cm, Yueh catheter was introduced. An ultrasound image was saved for documentation purposes. The paracentesis was performed. The catheter was removed and a dressing was applied. The patient tolerated the procedure well without immediate post procedural complication. FINDINGS: A total of approximately 4 L of dark yellow fluid was removed. Samples were sent to the laboratory as requested by the clinical team. IMPRESSION: Successful ultrasound-guided paracentesis yielding 4 liters of peritoneal fluid. PLAN: If the patient eventually requires >/=2 paracenteses in a 30 day period, candidacy for formal evaluation by the [facility] will be assessed.  Electronically Signed   By: Olive Bass M.D.   On: 11/24/2022 16:10   US Abdomen Limited RUQ (LIVER/GB)  Result Date: 11/23/2022 CLINICAL DATA:  62 year old male with history of alcoholic pancreatitis. EXAM: ULTRASOUND ABDOMEN LIMITED RIGHT UPPER QUADRANT COMPARISON:  No priors. FINDINGS: Gallbladder: No gallstones or wall thickening visualized. No sonographic Murphy sign noted by sonographer. Common bile duct: Diameter: 5.7 mm Liver: Liver has a nodular contour, indicative of underlying cirrhosis. No focal lesion identified. Within normal limits in parenchymal echogenicity. Portal vein is patent on color  Doppler imaging with normal direction of blood flow towards the liver. Other: Ascites. Small right pleural effusion. Anechoic lesion with increased through transmission in the region of the proximal pancreatic body with some dependent echogenic nonshadowing material (presumably internal debris), corresponding with large pancreatic pseudocyst noted on recent CT examination, estimated to measure 6.2 x 5.8 x 5.9 cm on today's study. IMPRESSION: 1. No gallstones or findings to suggest an acute cholecystitis. 2. Advanced cirrhosis with ascites redemonstrated. 3. Large pancreatic pseudocyst again noted. 4. Small right pleural effusion. Electronically Signed   By: Trudie Reed M.D.   On: 11/23/2022 06:26   CT ABDOMEN PELVIS W CONTRAST  Result Date: 11/22/2022 CLINICAL DATA:  Severe acute pancreatitis. Abdominal pain and bloating. Nausea and vomiting. EXAM: CT ABDOMEN AND PELVIS WITH CONTRAST TECHNIQUE: Multidetector CT imaging of the abdomen and pelvis was performed using the standard protocol following bolus administration of intravenous contrast. RADIATION DOSE REDUCTION: This exam was performed according to the departmental dose-optimization program which includes automated exposure control, adjustment of the mA and/or kV according to patient size and/or use of iterative reconstruction technique. CONTRAST:   OMNIPAQUE IOHEXOL 300 MG/ML  SOLN COMPARISON:  09/10/2022 FINDINGS: Lower Chest: No acute findings. Hepatobiliary: Capsular nodularity of the liver is consistent with cirrhosis. No hepatic masses are identified. Recanalization of paraumbilical veins again seen, consistent with portal venous hypertension. Gallbladder is unremarkable. No evidence of biliary ductal dilatation. Pancreas: Peripancreatic soft tissue stranding is again seen, greatest in the left upper quadrant, consistent with acute pancreatitis. Scattered pancreatic calcifications are consistent with chronic pancreatitis. A thin walled fluid collection is again seen in the pancreatic neck which measures 6.4 x 5.7 cm, mildly increased in size since previous study. Adjacent smaller low-attenuation lesion is seen in the pancreatic head which measures 2.3 x 1.5 cm, also mildly increased since previous study. These are consistent with pancreatic pseudocysts. Spleen: Within normal limits in size and appearance. Adrenals/Urinary Tract: No suspicious masses identified. No evidence of ureteral calculi or hydronephrosis. Stomach/Bowel: Moderate size hiatal hernia is again seen which also contains some ascites. Wall thickening of the splenic flexure of the colon is again seen, which is reactive in etiology secondary to acute pancreatitis. No evidence of bowel obstruction or focal inflammatory process. Moderate to large amount of ascites has increased since prior exam. Vascular/Lymphatic: Significant change in shotty sub-centimeter porta hepatis lymph nodes, likely reactive in etiology. No pathologically enlarged lymph nodes. No acute vascular findings. Reproductive:  No mass or other significant abnormality. Other:  None. Musculoskeletal:  No suspicious bone lesions identified. IMPRESSION: Acute on chronic pancreatitis, with mild increase in size of pseudocysts in the pancreatic neck and pancreatic head since prior exam. Stable wall thickening of the splenic  flexure of the colon, which is reactive in etiology secondary to acute pancreatitis. Moderate to large amount of ascites, increased since prior exam. Hepatic cirrhosis and findings of portal venous hypertension. No evidence of hepatic neoplasm. Stable moderate hiatal hernia. Electronically Signed   By: Danae Orleans M.D.   On: 11/22/2022 21:59             LOS: 3 days     Sunnie Nielsen, DO [facility] 11/26/2022, 2:29 PM    Dictation software may have been used to generate the above note. Typos may occur and escape review in typed/dictated notes. Please contact Dr Lyn Hollingshead directly for clarity if needed.  Staff may message me via secure chat in Epic  but this may not receive an immediate response,  please page me for urgent matters!  If 7PM-7AM, please contact night coverage [email or web address]

## 2022-11-26 NOTE — Progress Notes (Addendum)
0730 Medicated for CIWA of 28. 0800 Remains confused with agitation. Feet placed back in bed several times. 1300 Medicated for a CIWA of 21. 1500 Remains confused and agitated. Given prn Ativan and scheduled Phenobarbital. 1610 Bed ,hair ,and bath done. Tolerated fair.1 1800 Has slept all afternoon.

## 2022-11-27 LAB — COMPREHENSIVE METABOLIC PANEL
ALT: 14 U/L (ref 0–44)
AST: 44 U/L — ABNORMAL HIGH (ref 15–41)
Albumin: 2 g/dL — ABNORMAL LOW (ref 3.5–5.0)
Alkaline Phosphatase: 70 U/L (ref 38–126)
Anion gap: 8 (ref 5–15)
BUN: 12 mg/dL (ref 6–20)
CO2: 26 mmol/L (ref 22–32)
Calcium: 7.6 mg/dL — ABNORMAL LOW (ref 8.9–10.3)
Chloride: 103 mmol/L (ref 98–111)
Creatinine, Ser: 0.48 mg/dL — ABNORMAL LOW (ref 0.61–1.24)
GFR, Estimated: >60 mL/min (ref >60)
Glucose, Bld: 110 mg/dL — ABNORMAL HIGH (ref 70–99)
Potassium: 3.1 mmol/L — ABNORMAL LOW (ref 3.5–5.1)
Sodium: 137 mmol/L (ref 135–145)
Total Bilirubin: 1.8 mg/dL — ABNORMAL HIGH (ref 0.3–1.2)
Total Protein: 6.4 g/dL — ABNORMAL LOW (ref 6.5–8.1)

## 2022-11-27 LAB — CBC
HCT: 35.4 % — ABNORMAL LOW (ref 39.0–52.0)
Hemoglobin: 12.3 g/dL — ABNORMAL LOW (ref 13.0–17.0)
MCH: 33.4 pg (ref 26.0–34.0)
MCHC: 34.7 g/dL (ref 30.0–36.0)
MCV: 96.2 fL (ref 80.0–100.0)
Platelets: 196 10*3/uL (ref 150–400)
RBC: 3.68 MIL/uL — ABNORMAL LOW (ref 4.22–5.81)
RDW: 13.9 % (ref 11.5–15.5)
WBC: 6.8 10*3/uL (ref 4.0–10.5)
nRBC: 0 % (ref 0.0–0.2)

## 2022-11-27 MED ORDER — ENSURE ENLIVE PO LIQD
237.0000 mL | Freq: Three times a day (TID) | ORAL | Status: DC
  Administered 2022-11-28 (×3): 237 mL via ORAL

## 2022-11-27 NOTE — Progress Notes (Signed)
Uneventful day. Remained sleepy from sedation. After diet changed to full liquids,patient had to be fed. Remains sedated, in sinus rhythm now with 2L nasal to keep oxygen saturations greater than 92%

## 2022-11-27 NOTE — Progress Notes (Addendum)
Nutrition Follow-up  DOCUMENTATION CODES:   Non-severe (moderate) malnutrition in context of chronic illness  INTERVENTION:   Recommend NGT placement and nutrition support if pt's oral intake remains poor  Ensure Enlive po BID, each supplement provides 350 kcal and 20 grams of protein.  MVI, thiamine and folic acid po daily   Pt at high refeed risk; recommend monitor potassium, magnesium and phosphorus labs daily until stable  Daily weights   NUTRITION DIAGNOSIS:   Moderate Malnutrition related to chronic illness (hepatitis C, alcohol abuse, pancreatitis) as evidenced by mild fat depletion, moderate fat depletion, mild muscle depletion, moderate muscle depletion. -ongoing   GOAL:   Patient will meet greater than or equal to 90% of their needs -not met   MONITOR:   Diet advancement, Labs, Weight trends, Skin, I & O's, TF tolerance  ASSESSMENT:   62 y/o male with h/o etoh abuse, substance abuse, hepatitis C, GERD, hiatal hernia and cirrhosis who is admitted with acutre pancreatitis and pancreatic pseudocyst.  -Pt s/p paracentesis 5/3  Pt continues to have confusion. Pt has remained on NPO/clear liquid diet since admission. Per RN report, pt taking in small amounts of clear liquid diet. Pt advanced to a full liquid diet today. RD will add supplements to help pt meet his estimated needs. Would recommend NGT placement and nutrition support if pt's oral intake remains poor on full liquids. Pt is at high refeed risk. Per chart, pt appears to be up ~15lbs since admission. Pt +6.2L on her I & Os. No BM noted since admission.   Medications reviewed and include: lovenox, lasix, MVI, nicotine, protonix, aldactone, thiamine   Labs reviewed: K 3.1(L), creat 0.48(L) Mg 1.3(L)- 5/3 Lipase- 115(H)- 5/4  Diet Order:   Diet Order             Diet full liquid Room service appropriate? Yes; Fluid consistency: Thin  Diet effective now                  EDUCATION NEEDS:    Education needs have been addressed  Skin:  Skin Assessment: Reviewed RN Assessment  Last BM:  pta  Height:   Ht Readings from Last 1 Encounters:  11/22/22 6\' 2"  (1.88 m)    Weight:   Wt Readings from Last 1 Encounters:  11/25/22 79.9 kg    Ideal Body Weight:  86.4 kg  BMI:  Body mass index is 22.62 kg/m.  Estimated Nutritional Needs:   Kcal:  2200-2500kcal/day  Protein:  110-125g/day  Fluid:  2.4-2.7L/day  Betsey Holiday MS, RD, LDN Please refer to [facility] for RD and/or RD on-call/weekend/after hours pager

## 2022-11-27 NOTE — TOC CM/SW Note (Signed)
CSW acknowledges consult for SA resources. Patient currently oriented to self. Will follow progress and offer resources when appropriate.  Charlynn Court, CSW [phone]

## 2022-11-27 NOTE — Progress Notes (Addendum)
Still agitated and confused, ativan has worked to calm him down, but not lasting very long. Ativan and phenobarbital together works much better.

## 2022-11-27 NOTE — Progress Notes (Signed)
PROGRESS NOTE    Luke Rogers.   ZOX:[id number] DOB: 23-Mar-1961  DOA: 11/23/2022 Date of Service: 11/27/22 PCP: Patient, No Pcp Per     Brief Narrative / Hospital Course:  Luke Rogers. is a 62 y.o. male with medical history significant of alcohol abuse, GERD, hepatitis C, history of substance abuse, recurrent pancreatitis. To ED 11/22/22 w/ abd pain and bloating similar to previous pancreatitis episodes (last one was 05/2022). Typical EtOH use 3 tallboys beer per day, recently cut down to 2. (+)intermittent tremor.  05/02: admitted to hospitalist service for treatment pancreatitis, lipase nearly 1200. Following RUQ [place]. CIWA protocol, NPO and IV fluids, protonix. Pt is motivated to quit alcohol and treat withdrawals.  05/03: lipase down to 223. CIWA 1 this morning (+)tremor but improving later today. Paracentesis removed 4L. No apparent SBP.  05/04: CIWA up, valium requirement havs increased. Starting lasix + spiro. Minimal UOP, more confused/agitated, placed Foley. Transferred to stepdown for closer monitoring on phenobarbital.  05/05: remains in SDU on phenobarbital taper protocol.  05/06: Lipase trending down, has been w/o nutrition for some time, placing NG and starting tube feeds. Repeat paracentesis.   Consultants:  none  Procedures: 11/24/22 US Paracentesis --> 4L      ASSESSMENT & PLAN:   Principal Problem:   Alcoholic pancreatitis Active Problems:   Alcohol abuse   Tobacco abuse   GERD (gastroesophageal reflux disease)   Malnutrition of moderate degree   Acute alcoholic pancreatitis   Nicotine dependence   Alcoholic pancreatitis Acute pancreatitis with lipase nearly 1200 Trial CLD but he has not eaten prior to DT, placing NG today  Continue IV Fluids Pain control  Alcohol withdrawal Delirium Tremens Currently no able to make medical decisions, lacks capacity to participate in his own care  Continue benzodiazepine + phenobarbital in stepdown  unit  Alcoholic cirrhosis w/ ascites S/p Paracentesis  Low PMN, no apparent SBP MELD 12 = 6% 20-month mortality  Child-Pugh Child Class B = Indication for transplant evaluation Will need GI follow up outpatient  Starting furosemide and spironolactone EtOH abstinence  Repeat paracentesis today   Pancreatic pseudocyst Repeat imaging if pain worsens   Hypocalcemia Check Ionized Ca and Mg   Hypomagnesemia  Repleting  Monitor  Alcohol abuse/dependence counseled on importance of cessation CIWA protocol Continue to monitor  GERD (gastroesophageal reflux disease) Protonix   Tobacco abuse Patient is not ready to quit Nicotine patch Counseled on potential complications of smoking extensively, counseled on importance of cessation      DVT prophylaxis: lovenox  Pertinent IV fluids/nutrition: CLD as able, stopped IV fluids  Central lines / invasive devices: none  Code Status: FULL CODE ACP documentation reviewed: 05/02 none on file in [facility]   Current Admission Status: inpatient  TOC needs / Dispo plan: none at this time, anticipate d/c back home when improved  Barriers to discharge / significant pending items: NPO --> CLD, IV fluids, CIWA, expect will be here several more days              Subjective / Brief ROS:  Patient unable to participate in ROS. He is alert, confused. He is able to follow basic commands but not talking - same as yesterday   Family Communication: none at this time      Objective Findings:  Vitals:   11/27/22 0800 11/27/22 0900 11/27/22 1000 11/27/22 1100  BP: 111/82 99/73 112/70 109/76  Pulse:      Resp: 20 20 17  17  Temp: 98.7 F (37.1 C)     TempSrc: Oral     SpO2: 92%  91% 92%  Weight:      Height:        Intake/Output Summary (Last 24 hours) at 11/27/2022 1214 Last data filed at 11/27/2022 0800 Gross per 24 hour  Intake 340 ml  Output 230 ml  Net 110 ml    Filed Weights   11/23/22 0802 11/25/22 1645  Weight: 73.4 kg  79.9 kg    Examination:  Physical Exam Constitutional:      General: He is not in acute distress. Cardiovascular:     Rate and Rhythm: Normal rate and regular rhythm.  Pulmonary:     Effort: Pulmonary effort is normal.     Breath sounds: Normal breath sounds.  Abdominal:     General: There is distension.  Skin:    General: Skin is warm and dry.  Neurological:     General: No focal deficit present.     Mental Status: He is alert. He is disoriented.          Scheduled Medications:   Chlorhexidine Gluconate Cloth  6 each Topical Q0600   enoxaparin (LOVENOX) injection  40 mg Subcutaneous Q24H   furosemide  40 mg Oral Daily   LORazepam  0-4 mg Intravenous Q12H   multivitamin with minerals  1 tablet Oral Daily   nicotine  21 mg Transdermal Daily   pantoprazole (PROTONIX) IV  40 mg Intravenous Q24H   PHENObarbital  97.5 mg Intravenous Q8H   Followed by   PHENObarbital  65 mg Intravenous Q8H   Followed by   Melene Muller ON 11/29/2022] PHENObarbital  32.5 mg Intravenous Q8H   pneumococcal 20-valent conjugate vaccine  0.5 mL Intramuscular Tomorrow-1000   spironolactone  12.5 mg Oral BID   thiamine  100 mg Oral Daily   Or   thiamine  100 mg Intravenous Daily    Continuous Infusions:    PRN Medications:  acetaminophen **OR** acetaminophen, LORazepam, LORazepam **OR** LORazepam, morphine injection, ondansetron **OR** ondansetron (ZOFRAN) IV, mouth rinse, oxyCODONE  Antimicrobials from admission:  Anti-infectives (From admission, onward)    None           Data Reviewed:  I have personally reviewed the following...  CBC: Recent Labs  Lab 11/22/22 1941 11/24/22 0007 11/25/22 0733 11/27/22 1143  WBC 12.2* 11.9* 11.0* 6.8  NEUTROABS  --  7.0  --   --   HGB 13.2 12.2* 12.0* 12.3*  HCT 38.1* 35.5* 35.7* 35.4*  MCV 96.0 96.2 97.3 96.2  PLT 190 188 196 196    Basic Metabolic Panel: Recent Labs  Lab 11/22/22 1941 11/24/22 0007 11/25/22 0733  NA 133* 134* 135   K 3.5 3.8 3.8  CL 99 103 101  CO2 23 24 22   GLUCOSE 126* 91 97  BUN 9 13 9   CREATININE 0.68 0.60* 0.63  CALCIUM 8.3* 7.5* 7.5*  MG  --  1.3*  --     GFR: Estimated Creatinine Clearance: 112.4 mL/min (by C-G formula based on SCr of 0.63 mg/dL). Liver Function Tests: Recent Labs  Lab 11/22/22 1941 11/24/22 0007 11/25/22 0733  AST 41 32 42*  ALT 12 11 14   ALKPHOS 93 71 71  BILITOT 1.6* 1.8* 2.0*  PROT 8.0 6.9 6.4*  ALBUMIN 2.8* 2.3* 2.1*    Recent Labs  Lab 11/22/22 1941 11/23/22 0540 11/24/22 0007 11/25/22 0733  LIPASE 1,123* 410* 223* 115*    No results for input(s): "AMMONIA"  in the last 168 hours. Coagulation Profile: Recent Labs  Lab 11/25/22 0810  INR 1.4*    Cardiac Enzymes: No results for input(s): "CKTOTAL", "CKMB", "CKMBINDEX", "TROPONINI" in the last 168 hours. BNP (last 3 results) No results for input(s): "PROBNP" in the last 8760 hours. HbA1C: No results for input(s): "HGBA1C" in the last 72 hours. CBG: Recent Labs  Lab 11/25/22 1649  GLUCAP 102*    Lipid Profile: No results for input(s): "CHOL", "HDL", "LDLCALC", "TRIG", "CHOLHDL", "LDLDIRECT" in the last 72 hours.  Thyroid Function Tests: No results for input(s): "TSH", "T4TOTAL", "FREET4", "T3FREE", "THYROIDAB" in the last 72 hours. Anemia Panel: No results for input(s): "VITAMINB12", "FOLATE", "FERRITIN", "TIBC", "IRON", "RETICCTPCT" in the last 72 hours. Most Recent Urinalysis On File:     Component Value Date/Time   COLORURINE AMBER (A) 11/22/2022 1941   APPEARANCEUR HAZY (A) 11/22/2022 1941   LABSPEC 1.027 11/22/2022 1941   PHURINE 5.0 11/22/2022 1941   GLUCOSEU NEGATIVE 11/22/2022 1941   HGBUR NEGATIVE 11/22/2022 1941   BILIRUBINUR NEGATIVE 11/22/2022 1941   KETONESUR NEGATIVE 11/22/2022 1941   PROTEINUR NEGATIVE 11/22/2022 1941   NITRITE NEGATIVE 11/22/2022 1941   LEUKOCYTESUR NEGATIVE 11/22/2022 1941   Sepsis  Labs: @LABRCNTIP (procalcitonin:4,lacticidven:4) Microbiology: Recent Results (from the past 240 hour(s))  MRSA Next Gen by PCR, Nasal     Status: None   Collection Time: 11/25/22  4:45 PM   Specimen: Nasal Mucosa; Nasal Swab  Result Value Ref Range Status   MRSA by PCR Next Gen NOT DETECTED NOT DETECTED Final    Comment: (NOTE) The GeneXpert MRSA Assay (FDA approved for NASAL specimens only), is one component of a comprehensive MRSA colonization surveillance program. It is not intended to diagnose MRSA infection nor to guide or monitor treatment for MRSA infections. Test performance is not FDA approved in patients less than 4 years old. Performed at [facility], [address] [place], [place] [address]       Radiology Studies last 3 days: US Paracentesis  Result Date: 11/24/2022 INDICATION: Ascites; history of alcoholic pancreatitis. Ultrasound performed 11/23/2022 reveals advanced cirrhosis with ascites. EXAM: ULTRASOUND GUIDED diagnostic and therapeutic PARACENTESIS MEDICATIONS: 6 cc 1% lidocaine COMPLICATIONS: None immediate. PROCEDURE: Informed written consent was obtained from the patient after a discussion of the risks, benefits and alternatives to treatment. A timeout was performed prior to the initiation of the procedure. Initial ultrasound scanning demonstrates a large amount of ascites within the right lower abdominal quadrant. The right lower abdomen was prepped and draped in the usual sterile fashion. 1% lidocaine was used for local anesthesia. Following this, a 19 gauge, 7-cm, Yueh catheter was introduced. An ultrasound image was saved for documentation purposes. The paracentesis was performed. The catheter was removed and a dressing was applied. The patient tolerated the procedure well without immediate post procedural complication. FINDINGS: A total of approximately 4 L of dark yellow fluid was removed. Samples were sent to the laboratory as requested by the  clinical team. IMPRESSION: Successful ultrasound-guided paracentesis yielding 4 liters of peritoneal fluid. PLAN: If the patient eventually requires >/=2 paracenteses in a 30 day period, candidacy for formal evaluation by the [facility] will be assessed. Electronically Signed   By: Olive Bass M.D.   On: 11/24/2022 16:10             LOS: 4 days     Sunnie Nielsen, DO [facility] 11/27/2022, 12:14 PM    Dictation software may have been used to  generate the above note. Typos may occur and escape review in typed/dictated notes. Please contact Dr Lyn HollingsheadAlexander directly for clarity if needed.  Staff may message me via secure chat in Epic  but this may not receive an immediate response,  please page me for urgent matters!  If 7PM-7AM, please contact night coverage [email or web address]

## 2022-11-28 ENCOUNTER — Inpatient Hospital Stay: Payer: Medicaid Other

## 2022-11-28 LAB — BASIC METABOLIC PANEL
Anion gap: 9 (ref 5–15)
BUN: 13 mg/dL (ref 6–20)
CO2: 27 mmol/L (ref 22–32)
Calcium: 7.6 mg/dL — ABNORMAL LOW (ref 8.9–10.3)
Chloride: 100 mmol/L (ref 98–111)
Creatinine, Ser: 0.51 mg/dL — ABNORMAL LOW (ref 0.61–1.24)
GFR, Estimated: >60 mL/min (ref >60)
Glucose, Bld: 98 mg/dL (ref 70–99)
Potassium: 2.9 mmol/L — ABNORMAL LOW (ref 3.5–5.1)
Sodium: 136 mmol/L (ref 135–145)

## 2022-11-28 LAB — PATHOLOGIST SMEAR REVIEW

## 2022-11-28 LAB — GLUCOSE, PLEURAL OR PERITONEAL FLUID: Glucose, Fluid: 118 mg/dL

## 2022-11-28 LAB — PHOSPHORUS: Phosphorus: 2.8 mg/dL (ref 2.5–4.6)

## 2022-11-28 LAB — BODY FLUID CELL COUNT WITH DIFFERENTIAL
Eos, Fluid: 1 %
Lymphs, Fluid: 14 %
Monocyte-Macrophage-Serous Fluid: 75 %
Neutrophil Count, Fluid: 10 %
Total Nucleated Cell Count, Fluid: 488 cu mm

## 2022-11-28 LAB — LACTATE DEHYDROGENASE, PLEURAL OR PERITONEAL FLUID: LD, Fluid: 75 U/L — ABNORMAL HIGH (ref 3–23)

## 2022-11-28 LAB — AMMONIA: Ammonia: 36 umol/L — ABNORMAL HIGH (ref 9–35)

## 2022-11-28 LAB — PROTEIN, PLEURAL OR PERITONEAL FLUID: Total protein, fluid: <3 g/dL

## 2022-11-28 LAB — MAGNESIUM: Magnesium: 1.7 mg/dL (ref 1.7–2.4)

## 2022-11-28 MED ORDER — LORAZEPAM 2 MG/ML IJ SOLN
1.0000 mg | INTRAMUSCULAR | Status: AC | PRN
  Administered 2022-11-29: 1 mg via INTRAVENOUS
  Filled 2022-11-28: qty 1

## 2022-11-28 MED ORDER — POTASSIUM CHLORIDE CRYS ER 20 MEQ PO TBCR: 40.0000 meq | EXTENDED_RELEASE_TABLET | Freq: Four times a day (QID) | ORAL | Status: DC

## 2022-11-28 MED ORDER — POTASSIUM CHLORIDE 10 MEQ/100ML IV SOLN
10.0000 meq | INTRAVENOUS | Status: AC
  Administered 2022-11-28 (×6): 10 meq via INTRAVENOUS
  Filled 2022-11-28 (×6): qty 100

## 2022-11-28 MED ORDER — SENNOSIDES-DOCUSATE SODIUM 8.6-50 MG PO TABS: 2.0000 | ORAL_TABLET | Freq: Every evening | ORAL | Status: DC | PRN

## 2022-11-28 MED ORDER — LIDOCAINE HCL (PF) 1 % IJ SOLN
10.0000 mL | Freq: Once | INTRAMUSCULAR | Status: AC
  Administered 2022-11-28: 10 mL via INTRADERMAL
  Filled 2022-11-28: qty 10

## 2022-11-28 MED ORDER — POTASSIUM CHLORIDE CRYS ER 20 MEQ PO TBCR
40.0000 meq | EXTENDED_RELEASE_TABLET | Freq: Once | ORAL | Status: AC
  Administered 2022-11-28: 40 meq via ORAL
  Filled 2022-11-28: qty 2

## 2022-11-28 MED ORDER — LACTULOSE 10 GM/15ML PO SOLN
20.0000 g | Freq: Two times a day (BID) | ORAL | Status: DC | PRN
  Administered 2022-12-11 – 2022-12-18 (×3): 20 g via ORAL
  Filled 2022-11-28 (×3): qty 30

## 2022-11-28 MED ORDER — POLYETHYLENE GLYCOL 3350 17 G PO PACK: 17.0000 g | PACK | Freq: Two times a day (BID) | ORAL | Status: DC

## 2022-11-28 MED ORDER — SENNOSIDES-DOCUSATE SODIUM 8.6-50 MG PO TABS: 1.0000 | ORAL_TABLET | Freq: Two times a day (BID) | ORAL | Status: DC

## 2022-11-28 MED ORDER — LORAZEPAM 1 MG PO TABS: 1.0000 mg | ORAL_TABLET | ORAL | Status: AC | PRN

## 2022-11-28 NOTE — Progress Notes (Signed)
PROGRESS NOTE    Luke Rogers.   GNF:[id number] DOB: September 18, 1960  DOA: 11/23/2022 Date of Service: 11/28/22 PCP: Patient, No Pcp Per     Brief Narrative / Hospital Course:  Luke Rogers. is a 62 y.o. male with medical history significant of alcohol abuse, GERD, hepatitis C, history of substance abuse, recurrent pancreatitis. To ED 11/22/22 w/ abd pain and bloating similar to previous pancreatitis episodes (last one was 05/2022). Typical EtOH use 3 tallboys beer per day, recently cut down to 2. (+)intermittent tremor.  05/02: admitted to hospitalist service for treatment pancreatitis, lipase nearly 1200. Following RUQ [place]. CIWA protocol, NPO and IV fluids, protonix. Pt is motivated to quit alcohol and treat withdrawals.  05/03: lipase down to 223. CIWA 1 this morning (+)tremor but improving later today. Paracentesis removed 4L. No apparent SBP.  05/04: CIWA up, valium requirement havs increased. Starting lasix + spiro. Minimal UOP, more confused/agitated, placed Foley. Transferred to stepdown for closer monitoring on phenobarbital.  05/05: remains in SDU on phenobarbital taper protocol.  05/06: Lipase trending down, has been w/o nutrition for some time, if not tolerating CLD may need NG and tube feeds. Repeat paracentesis ordered.  05/07: quite agitated, requiring ativan + phenobarb but CIWA scores better on this. Paracentesis repeated removing 2L --> IR's portal HTN clinic referral made   Consultants:  none  Procedures: 11/24/22 US Paracentesis --> 4L 11/28/22 US Paracentesis --> 2L       ASSESSMENT & PLAN:   Principal Problem:   Alcoholic pancreatitis Active Problems:   Alcohol abuse   Tobacco abuse   GERD (gastroesophageal reflux disease)   Malnutrition of moderate degree   Acute alcoholic pancreatitis   Nicotine dependence   Alcoholic pancreatitis Acute pancreatitis with lipase nearly 1200 Trial soft diet but he has not eaten prior to DT, may need to  place NG  Continue IV Fluids Pain control  Alcohol withdrawal Delirium Tremens Currently no able to make medical decisions, lacks capacity to participate in his own care  Continue benzodiazepine + phenobarbital in stepdown unit  Alcoholic cirrhosis w/ ascites S/p Paracentesis  Low PMN, no apparent SBP MELD 12 = 6% 38-month mortality  Child-Pugh Child Class B = Indication for transplant evaluation Will need GI follow up outpatient  Starting furosemide and spironolactone EtOH abstinence  Repeat paracentesis today  F/u portal HTN clinic   Delirium D/t EtOH withdrawal Ammonia very slightly above WNL, do not think hepatic encephalopathy Given constipation as well, will give lactulose for this Consider scheduled lactulose, will trend ammonia   Pancreatic pseudocyst Repeat imaging if pain worsens   Hypocalcemia Check Ionized Ca and Mg   Hypomagnesemia  Repleting  Monitor  Alcohol abuse/dependence counseled on importance of cessation CIWA protocol Continue to monitor  GERD (gastroesophageal reflux disease) Protonix   Tobacco abuse Patient is not ready to quit Nicotine patch Counseled on potential complications of smoking extensively, counseled on importance of cessation  Constipation Bowel regimen including scheduled miralax x2, senna-docusate x2, enema prn, lactulose prn     DVT prophylaxis: lovenox  Pertinent IV fluids/nutrition: CLD as able, stopped IV fluids  Central lines / invasive devices: none  Code Status: FULL CODE ACP documentation reviewed: 05/02 none on file in [facility]   Current Admission Status: inpatient  TOC needs / Dispo plan: none at this time, anticipate d/c back home when improved  Barriers to discharge / significant pending items: NPO --> CLD, IV fluids, CIWA, expect will be here several more days  Subjective / Brief ROS:  Patient unable to participate in ROS. He is lethargic (recently received ativan), confused.     Family Communication: none at this time      Objective Findings:  Vitals:   11/28/22 1000 11/28/22 1100 11/28/22 1331 11/28/22 1357  BP: 102/70 108/81 112/75 122/87  Pulse:      Resp: 18 (!) 21    Temp:      TempSrc:      SpO2:   93% 96%  Weight:      Height:        Intake/Output Summary (Last 24 hours) at 11/28/2022 1502 Last data filed at 11/28/2022 0954 Gross per 24 hour  Intake 460 ml  Output 810 ml  Net -350 ml   Filed Weights   11/23/22 0802 11/25/22 1645 11/28/22 0410  Weight: 73.4 kg 79.9 kg 80 kg    Examination:  Physical Exam Constitutional:      General: He is not in acute distress. Cardiovascular:     Rate and Rhythm: Normal rate and regular rhythm.  Pulmonary:     Effort: Pulmonary effort is normal.     Breath sounds: Normal breath sounds.  Abdominal:     General: There is distension.  Skin:    General: Skin is warm and dry.  Neurological:     General: No focal deficit present.     Mental Status: He is alert. He is disoriented.          Scheduled Medications:   Chlorhexidine Gluconate Cloth  6 each Topical Q0600   enoxaparin (LOVENOX) injection  40 mg Subcutaneous Q24H   feeding supplement  237 mL Oral TID BM   furosemide  40 mg Oral Daily   LORazepam  0-4 mg Intravenous Q12H   multivitamin with minerals  1 tablet Oral Daily   nicotine  21 mg Transdermal Daily   pantoprazole (PROTONIX) IV  40 mg Intravenous Q24H   PHENObarbital  65 mg Intravenous Q8H   Followed by   Melene Muller ON 11/29/2022] PHENObarbital  32.5 mg Intravenous Q8H   pneumococcal 20-valent conjugate vaccine  0.5 mL Intramuscular Tomorrow-1000   polyethylene glycol  17 g Oral BID   senna-docusate  1 tablet Oral BID   spironolactone  12.5 mg Oral BID   thiamine  100 mg Oral Daily   Or   thiamine  100 mg Intravenous Daily    Continuous Infusions:    PRN Medications:  acetaminophen **OR** acetaminophen, lactulose, LORazepam, LORazepam **OR** LORazepam, morphine  injection, ondansetron **OR** ondansetron (ZOFRAN) IV, mouth rinse, oxyCODONE  Antimicrobials from admission:  Anti-infectives (From admission, onward)    None           Data Reviewed:  I have personally reviewed the following...  CBC: Recent Labs  Lab 11/22/22 1941 11/24/22 0007 11/25/22 0733 11/27/22 1143  WBC 12.2* 11.9* 11.0* 6.8  NEUTROABS  --  7.0  --   --   HGB 13.2 12.2* 12.0* 12.3*  HCT 38.1* 35.5* 35.7* 35.4*  MCV 96.0 96.2 97.3 96.2  PLT 190 188 196 196   Basic Metabolic Panel: Recent Labs  Lab 11/22/22 1941 11/24/22 0007 11/25/22 0733 11/27/22 1143 11/28/22 0442  NA 133* 134* 135 137 136  K 3.5 3.8 3.8 3.1* 2.9*  CL 99 103 101 103 100  CO2 23 24 22 26 27   GLUCOSE 126* 91 97 110* 98  BUN 9 13 9 12 13   CREATININE 0.68 0.60* 0.63 0.48* 0.51*  CALCIUM 8.3* 7.5* 7.5*  7.6* 7.6*  MG  --  1.3*  --   --  1.7  PHOS  --   --   --   --  2.8   GFR: Estimated Creatinine Clearance: 112.5 mL/min (A) (by C-G formula based on SCr of 0.51 mg/dL (L)). Liver Function Tests: Recent Labs  Lab 11/22/22 1941 11/24/22 0007 11/25/22 0733 11/27/22 1143  AST 41 32 42* 44*  ALT 12 11 14 14   ALKPHOS 93 71 71 70  BILITOT 1.6* 1.8* 2.0* 1.8*  PROT 8.0 6.9 6.4* 6.4*  ALBUMIN 2.8* 2.3* 2.1* 2.0*   Recent Labs  Lab 11/22/22 1941 11/23/22 0540 11/24/22 0007 11/25/22 0733  LIPASE 1,123* 410* 223* 115*   Recent Labs  Lab 11/28/22 1252  AMMONIA 36*   Coagulation Profile: Recent Labs  Lab 11/25/22 0810  INR 1.4*   Cardiac Enzymes: No results for input(s): "CKTOTAL", "CKMB", "CKMBINDEX", "TROPONINI" in the last 168 hours. BNP (last 3 results) No results for input(s): "PROBNP" in the last 8760 hours. HbA1C: No results for input(s): "HGBA1C" in the last 72 hours. CBG: Recent Labs  Lab 11/25/22 1649  GLUCAP 102*   Lipid Profile: No results for input(s): "CHOL", "HDL", "LDLCALC", "TRIG", "CHOLHDL", "LDLDIRECT" in the last 72 hours.  Thyroid Function  Tests: No results for input(s): "TSH", "T4TOTAL", "FREET4", "T3FREE", "THYROIDAB" in the last 72 hours. Anemia Panel: No results for input(s): "VITAMINB12", "FOLATE", "FERRITIN", "TIBC", "IRON", "RETICCTPCT" in the last 72 hours. Most Recent Urinalysis On File:     Component Value Date/Time   COLORURINE AMBER (A) 11/22/2022 1941   APPEARANCEUR HAZY (A) 11/22/2022 1941   LABSPEC 1.027 11/22/2022 1941   PHURINE 5.0 11/22/2022 1941   GLUCOSEU NEGATIVE 11/22/2022 1941   HGBUR NEGATIVE 11/22/2022 1941   BILIRUBINUR NEGATIVE 11/22/2022 1941   KETONESUR NEGATIVE 11/22/2022 1941   PROTEINUR NEGATIVE 11/22/2022 1941   NITRITE NEGATIVE 11/22/2022 1941   LEUKOCYTESUR NEGATIVE 11/22/2022 1941   Sepsis Labs: @LABRCNTIP (procalcitonin:4,lacticidven:4) Microbiology: Recent Results (from the past 240 hour(s))  MRSA Next Gen by PCR, Nasal     Status: None   Collection Time: 11/25/22  4:45 PM   Specimen: Nasal Mucosa; Nasal Swab  Result Value Ref Range Status   MRSA by PCR Next Gen NOT DETECTED NOT DETECTED Final    Comment: (NOTE) The GeneXpert MRSA Assay (FDA approved for NASAL specimens only), is one component of a comprehensive MRSA colonization surveillance program. It is not intended to diagnose MRSA infection nor to guide or monitor treatment for MRSA infections. Test performance is not FDA approved in patients less than 11 years old. Performed at [facility], [address] [place], [place] [address]       Radiology Studies last 3 days: US Paracentesis  Result Date: 11/24/2022 INDICATION: Ascites; history of alcoholic pancreatitis. Ultrasound performed 11/23/2022 reveals advanced cirrhosis with ascites. EXAM: ULTRASOUND GUIDED diagnostic and therapeutic PARACENTESIS MEDICATIONS: 6 cc 1% lidocaine COMPLICATIONS: None immediate. PROCEDURE: Informed written consent was obtained from the patient after a discussion of the risks, benefits and alternatives to treatment. A timeout  was performed prior to the initiation of the procedure. Initial ultrasound scanning demonstrates a large amount of ascites within the right lower abdominal quadrant. The right lower abdomen was prepped and draped in the usual sterile fashion. 1% lidocaine was used for local anesthesia. Following this, a 19 gauge, 7-cm, Yueh catheter was introduced. An ultrasound image was saved for documentation purposes. The paracentesis was performed. The catheter was removed and a dressing was  applied. The patient tolerated the procedure well without immediate post procedural complication. FINDINGS: A total of approximately 4 L of dark yellow fluid was removed. Samples were sent to the laboratory as requested by the clinical team. IMPRESSION: Successful ultrasound-guided paracentesis yielding 4 liters of peritoneal fluid. PLAN: If the patient eventually requires >/=2 paracenteses in a 30 day period, candidacy for formal evaluation by the [facility] Inter[facility] Rad[facility] Hypertension Clinic will be assessed. Electronically Signed   By: Olive Bass M.D.   On: 11/24/2022 16:10             LOS: 5 days     Sunnie Nielsen, DO [facility] 11/28/2022, 3:02 PM    Dictation software may have been used to generate the above note. Typos may occur and escape review in typed/dictated notes. Please contact Dr Lyn Hollingshead directly for clarity if needed.  Staff may message me via secure chat in Epic  but this may not receive an immediate response,  please page me for urgent matters!  If 7PM-7AM, please contact night coverage [email or web address]

## 2022-11-28 NOTE — Progress Notes (Signed)
Nutrition Follow-up  DOCUMENTATION CODES:   Non-severe (moderate) malnutrition in context of chronic illness  INTERVENTION:   Ensure Enlive po TID, each supplement provides 350 kcal and 20 grams of protein.  MVI, thiamine and folic acid po daily   Pt at high refeed risk; recommend monitor potassium, magnesium and phosphorus labs daily until stable  Daily weights   NUTRITION DIAGNOSIS:   Moderate Malnutrition related to chronic illness (hepatitis C, alcohol abuse, pancreatitis) as evidenced by mild fat depletion, moderate fat depletion, mild muscle depletion, moderate muscle depletion. -ongoing   GOAL:   Patient will meet greater than or equal to 90% of their needs -progressing   MONITOR:   PO intake, Supplement acceptance, Labs, Weight trends, I & O's, Skin  ASSESSMENT:   62 y/o male with h/o etoh abuse, substance abuse, hepatitis C, GERD, hiatal hernia and cirrhosis who is admitted with acutre pancreatitis and pancreatic pseudocyst.  -Pt s/p paracentesis 5/3  RN reports pt ate ~50% of his breakfast this morning and drank 50% of an Ensure. Pt advanced to a soft diet today. Pt seems calmer this morning. Refeed labs wnl; pt remains at high refeed risk. No BM since admission; MD aware. Will hold on NGT for now and monitor oral intakes. Per chart, pt is up ~15lbs since admission. Pt +5.7L on his I & Os.      Medications reviewed and include: lovenox, lasix, MVI, nicotine, protonix, aldactone, thiamine   Labs reviewed: K 2.9(L), creat 0.51(L), P 2.8 wnl, Mg 1.7 wnl Lipase- 115(H)- 5/4  Diet Order:   Diet Order             DIET SOFT Room service appropriate? Yes; Fluid consistency: Thin  Diet effective now                  EDUCATION NEEDS:   Education needs have been addressed  Skin:  Skin Assessment: Reviewed RN Assessment  Last BM:  pta  Height:   Ht Readings from Last 1 Encounters:  11/22/22 6\' 2"  (1.88 m)    Weight:   Wt Readings from Last 1  Encounters:  11/28/22 80 kg    Ideal Body Weight:  86.4 kg  BMI:  Body mass index is 22.64 kg/m.  Estimated Nutritional Needs:   Kcal:  2200-2500kcal/day  Protein:  110-125g/day  Fluid:  2.4-2.7L/day  Betsey Holiday MS, RD, LDN Please refer to [facility] for RD and/or RD on-call/weekend/after hours pager

## 2022-11-28 NOTE — Procedures (Signed)
PROCEDURE SUMMARY:  Successful ultrasound guided paracentesis from the right  lower quadrant.  Yielded 2 L of straw fluid.  No immediate complications.  The patient tolerated the procedure well.   Specimen was sent for labs.  EBL < 5mL  The patient has required >/=2 paracenteses in a 30 day period and a screening evaluation by the [facility] has been arranged.

## 2022-11-29 ENCOUNTER — Inpatient Hospital Stay: Payer: Medicaid Other

## 2022-11-29 DIAGNOSIS — F10931 Alcohol use, unspecified with withdrawal delirium: Secondary | ICD-10-CM

## 2022-11-29 DIAGNOSIS — R569 Unspecified convulsions: Secondary | ICD-10-CM

## 2022-11-29 LAB — COMPREHENSIVE METABOLIC PANEL
ALT: 17 U/L (ref 0–44)
AST: 53 U/L — ABNORMAL HIGH (ref 15–41)
Albumin: 2.2 g/dL — ABNORMAL LOW (ref 3.5–5.0)
Alkaline Phosphatase: 72 U/L (ref 38–126)
Anion gap: 6 (ref 5–15)
BUN: 14 mg/dL (ref 6–20)
CO2: 26 mmol/L (ref 22–32)
Calcium: 7.7 mg/dL — ABNORMAL LOW (ref 8.9–10.3)
Chloride: 102 mmol/L (ref 98–111)
Creatinine, Ser: 0.55 mg/dL — ABNORMAL LOW (ref 0.61–1.24)
GFR, Estimated: >60 mL/min (ref >60)
Glucose, Bld: 112 mg/dL — ABNORMAL HIGH (ref 70–99)
Potassium: 3.6 mmol/L (ref 3.5–5.1)
Sodium: 134 mmol/L — ABNORMAL LOW (ref 135–145)
Total Bilirubin: 1.4 mg/dL — ABNORMAL HIGH (ref 0.3–1.2)
Total Protein: 6.8 g/dL (ref 6.5–8.1)

## 2022-11-29 LAB — MAGNESIUM
Magnesium: 1.5 mg/dL — ABNORMAL LOW (ref 1.7–2.4)
Magnesium: 2.1 mg/dL (ref 1.7–2.4)

## 2022-11-29 LAB — PROCALCITONIN: Procalcitonin: 0.13 ng/mL

## 2022-11-29 LAB — LACTIC ACID, PLASMA: Lactic Acid, Venous: 1.3 mmol/L (ref 0.5–1.9)

## 2022-11-29 LAB — AMMONIA: Ammonia: 36 umol/L — ABNORMAL HIGH (ref 9–35)

## 2022-11-29 LAB — PHOSPHORUS: Phosphorus: 3.4 mg/dL (ref 2.5–4.6)

## 2022-11-29 MED ORDER — MAGNESIUM SULFATE 2 GM/50ML IV SOLN
2.0000 g | INTRAVENOUS | Status: AC
  Administered 2022-11-29 (×2): 2 g via INTRAVENOUS
  Filled 2022-11-29 (×2): qty 50

## 2022-11-29 MED ORDER — PROSOURCE TF20 ENFIT COMPATIBL EN LIQD
60.0000 mL | Freq: Every day | ENTERAL | Status: DC
  Administered 2022-11-30 – 2022-12-05 (×6): 60 mL
  Filled 2022-11-29: qty 60

## 2022-11-29 MED ORDER — FREE WATER
30.0000 mL | Status: DC
  Administered 2022-11-29 – 2022-12-05 (×33): 30 mL

## 2022-11-29 MED ORDER — OSMOLITE 1.5 CAL PO LIQD
1000.0000 mL | ORAL | Status: DC
  Administered 2022-11-29 – 2022-12-03 (×3): 1000 mL

## 2022-11-29 MED ORDER — ACETAMINOPHEN 10 MG/ML IV SOLN
1000.0000 mg | Freq: Once | INTRAVENOUS | Status: AC
  Administered 2022-11-29: 1000 mg via INTRAVENOUS
  Filled 2022-11-29: qty 100

## 2022-11-29 NOTE — Progress Notes (Signed)
Pt continues to be very lethargic and unable to safely take anything PO. Md notified and NGT placed. Pt with increased o2 requirement and currently up to 6L via nasal cannula. Pts temp up to 99.9 ax. Md notified of changes and CXR ordered an awaiting results. Will continue to monitor.

## 2022-11-29 NOTE — Progress Notes (Signed)
Nutrition Follow-up  DOCUMENTATION CODES:   Non-severe (moderate) malnutrition in context of chronic illness  INTERVENTION:   Osmolite 1.5@65ml /hr- Initiate at 42ml/hr and increase by 36ml/hr q 8 hours until goal rate is reached.   ProSource TF 20- Give 60ml daily via tube, each supplement provides 80kcal and 20g of protein.   Free water flushes 30ml q4 hours to maintain tube patency   Regimen provides 2420kcal/day, 118g/day protein and 1321ml/day of free water.   MVI, thiamine and folic acid daily via tube   Pt at high refeed risk; recommend monitor potassium, magnesium and phosphorus labs daily until stable  Daily weights   NUTRITION DIAGNOSIS:   Moderate Malnutrition related to chronic illness (hepatitis C, alcohol abuse, pancreatitis) as evidenced by mild fat depletion, moderate fat depletion, mild muscle depletion, moderate muscle depletion. -ongoing   GOAL:   Patient will meet greater than or equal to 90% of their needs -not met   MONITOR:   PO intake, Supplement acceptance, Labs, Weight trends, I & O's, Skin  ASSESSMENT:   62 y/o male with h/o etoh abuse, substance abuse, hepatitis C, GERD, hiatal hernia and cirrhosis who is admitted with acutre pancreatitis and pancreatic pseudocyst.  -Pt s/p paracentesis 5/3 & 5/7  Pt more lethargic today. Pt is unable to take anything by mouth. Pt has now been without nutrition for 6 days. NGT placed today; KUB is still pending. Will plan to initiate tube feeds. Pt is at high refeed risk. Per chart, pt appears fairly weight stable since admission.     Medications reviewed and include: lovenox, lasix, MVI, nicotine, protonix, aldactone, thiamine, Mg sulfate    Labs reviewed: Na 134(L), K 3.6 wnl, creat 0.55(L), P 3.4 wnl, Mg 1.5(L)  Diet Order:   Diet Order             DIET SOFT Room service appropriate? Yes; Fluid consistency: Thin  Diet effective now                  EDUCATION NEEDS:   Education needs have been  addressed  Skin:  Skin Assessment: Reviewed RN Assessment  Last BM:  5/7- type 6  Height:   Ht Readings from Last 1 Encounters:  11/22/22 6\' 2"  (1.88 m)    Weight:   Wt Readings from Last 1 Encounters:  11/29/22 80 kg    Ideal Body Weight:  86.4 kg  BMI:  Body mass index is 22.64 kg/m.  Estimated Nutritional Needs:   Kcal:  2200-2500kcal/day  Protein:  110-125g/day  Fluid:  2.4-2.7L/day  Betsey Holiday MS, RD, LDN Please refer to [facility] for RD and/or RD on-call/weekend/after hours pager

## 2022-11-29 NOTE — Progress Notes (Signed)
Progress Note   Patient: Luke Rogers. MWN:[id number] DOB: 1961-06-27 DOA: 11/23/2022     6 DOS: the patient was seen and examined on 11/29/2022     Subjective  Patient seen and examined at bedside this morning Was unable to engage in conversation on account of altered mental status Also seen by dietitian Apparently patient has not been eating for the last several days We will optimize his nutrition by placing an NG tube and initiate feeding This will help with patient's recovery process   Brief Narrative / Hospital Course:  Luke Rogers. is a 62 y.o. male with medical history significant of alcohol abuse, GERD, hepatitis C, history of substance abuse, recurrent pancreatitis. To ED 11/22/22 w/ abd pain and bloating similar to previous pancreatitis episodes (last one was 05/2022). Typical EtOH use 3 tallboys beer per day, recently cut down to 2. (+)intermittent tremor.  05/02: admitted to hospitalist service for treatment pancreatitis, lipase nearly 1200. Following RUQ [place]. CIWA protocol, NPO and IV fluids, protonix. Pt is motivated to quit alcohol and treat withdrawals.  05/03: lipase down to 223. CIWA 1 this morning (+)tremor but improving later today. Paracentesis removed 4L. No apparent SBP.  05/04: CIWA up, valium requirement havs increased. Starting lasix + spiro. Minimal UOP, more confused/agitated, placed Foley. Transferred to stepdown for closer monitoring on phenobarbital.  05/05: remains in SDU on phenobarbital taper protocol.  05/06: Lipase trending down, has been w/o nutrition for some time, if not tolerating CLD may need NG and tube feeds. Repeat paracentesis ordered.  05/07: quite agitated, requiring ativan + phenobarb but CIWA scores better on this. Paracentesis repeated removing 2L --> IR's portal HTN clinic referral made    Consultants:  none   Procedures: 11/24/22 US Paracentesis --> 4L 11/28/22 US Paracentesis --> 2L        ASSESSMENT & PLAN:    Principal Problem:   Alcoholic pancreatitis Active Problems:   Alcohol abuse   Tobacco abuse   GERD (gastroesophageal reflux disease)   Malnutrition of moderate degree   Acute alcoholic pancreatitis   Nicotine dependence     Alcoholic pancreatitis Acute pancreatitis with lipase nearly 1200 NG tube have been recommended Continue IV Fluids Pain control   Alcohol withdrawal Delirium Tremens Currently no able to make medical decisions, lacks capacity to participate in his own care  Continue benzodiazepine + phenobarbital in stepdown unit   Alcoholic cirrhosis w/ ascites S/p Paracentesis  Low PMN, no apparent SBP MELD 12 = 6% 7-month mortality  Child-Pugh Child Class B = Indication for transplant evaluation Will need GI follow up outpatient  Continue furosemide and spironolactone EtOH abstinence  Repeat paracentesis today  F/u portal HTN clinic    Delirium D/t EtOH withdrawal Ammonia very slightly above WNL, do not think hepatic encephalopathy Given constipation as well, will give lactulose for this Consider scheduled lactulose, will trend ammonia    Pancreatic pseudocyst Repeat imaging if pain worsens    Hypocalcemia Check Ionized Ca and Mg    Hypomagnesemia  Repleting  Monitor   Alcohol abuse/dependence counseled on importance of cessation CIWA protocol Continue to monitor   GERD (gastroesophageal reflux disease) Protonix    Tobacco abuse Patient is not ready to quit Nicotine patch Counseled on potential complications of smoking extensively, counseled on importance of cessation   Constipation Bowel regimen including scheduled miralax x2, senna-docusate x2, enema prn, lactulose prn        DVT prophylaxis: lovenox   Central lines / invasive devices:  none   Code Status: FULL CODE    Current Admission Status: inpatient   TOC needs / Dispo plan: none at this time, anticipate d/c back home when improved   Barriers to discharge / significant  pending items: NPO --> CLD, IV fluids, CIWA, expect will be here several more days      Physical Exam Constitutional:      General: He is not in acute distress. Cardiovascular:     Rate and Rhythm: Normal rate and regular rhythm.  Pulmonary:     Effort: Pulmonary effort is normal.     Breath sounds: Normal breath sounds.  Abdominal:     General: There is distension.  Skin:    General: Skin is warm and dry.  Neurological:     General: No focal deficit present.     Mental Status: He is alert. He is disoriented.       Data Reviewed: Laboratory results reviewed by me today showed magnesium 1.5  Family Communication: None present at bedside   Time spent: 50 minutes  Vitals:   11/29/22 1100 11/29/22 1200 11/29/22 1300 11/29/22 1400  BP: 110/71 109/77 111/77 109/72  Pulse:   (!) 101 100  Resp: (!) 27 (!) 39 (!) 24 (!) 27  Temp:  98.8 F (37.1 C)    TempSrc:  Oral    SpO2:  95% 96% 93%  Weight:      Height:         Author: Loyce Dys, MD 11/29/2022 3:37 PM  For on call review [email or web address].

## 2022-11-30 ENCOUNTER — Inpatient Hospital Stay: Payer: Medicaid Other

## 2022-11-30 LAB — BASIC METABOLIC PANEL
Anion gap: 6 (ref 5–15)
BUN: 22 mg/dL — ABNORMAL HIGH (ref 6–20)
CO2: 27 mmol/L (ref 22–32)
Calcium: 7.7 mg/dL — ABNORMAL LOW (ref 8.9–10.3)
Chloride: 104 mmol/L (ref 98–111)
Creatinine, Ser: 0.53 mg/dL — ABNORMAL LOW (ref 0.61–1.24)
GFR, Estimated: >60 mL/min (ref >60)
Glucose, Bld: 143 mg/dL — ABNORMAL HIGH (ref 70–99)
Potassium: 3.5 mmol/L (ref 3.5–5.1)
Sodium: 137 mmol/L (ref 135–145)

## 2022-11-30 LAB — CBC WITH DIFFERENTIAL/PLATELET
Abs Immature Granulocytes: 0.05 10*3/uL (ref 0.00–0.07)
Basophils Absolute: 0.1 10*3/uL (ref 0.0–0.1)
Basophils Relative: 1 %
Eosinophils Absolute: 0.1 10*3/uL (ref 0.0–0.5)
Eosinophils Relative: 1 %
HCT: 34.3 % — ABNORMAL LOW (ref 39.0–52.0)
Hemoglobin: 11.7 g/dL — ABNORMAL LOW (ref 13.0–17.0)
Immature Granulocytes: 0 %
Lymphocytes Relative: 17 %
Lymphs Abs: 2.2 10*3/uL (ref 0.7–4.0)
MCH: 33 pg (ref 26.0–34.0)
MCHC: 34.1 g/dL (ref 30.0–36.0)
MCV: 96.6 fL (ref 80.0–100.0)
Monocytes Absolute: 1.1 10*3/uL — ABNORMAL HIGH (ref 0.1–1.0)
Monocytes Relative: 9 %
Neutro Abs: 9.5 10*3/uL — ABNORMAL HIGH (ref 1.7–7.7)
Neutrophils Relative %: 72 %
Platelets: 234 10*3/uL (ref 150–400)
RBC: 3.55 MIL/uL — ABNORMAL LOW (ref 4.22–5.81)
RDW: 14.1 % (ref 11.5–15.5)
WBC: 13 10*3/uL — ABNORMAL HIGH (ref 4.0–10.5)
nRBC: 0 % (ref 0.0–0.2)

## 2022-11-30 LAB — PHOSPHORUS: Phosphorus: 3.7 mg/dL (ref 2.5–4.6)

## 2022-11-30 LAB — MAGNESIUM: Magnesium: 2.1 mg/dL (ref 1.7–2.4)

## 2022-11-30 LAB — LACTIC ACID, PLASMA: Lactic Acid, Venous: 1.3 mmol/L (ref 0.5–1.9)

## 2022-11-30 MED ORDER — SODIUM CHLORIDE 0.9 % IV SOLN
2.0000 g | INTRAVENOUS | Status: AC
  Administered 2022-11-30 – 2022-12-04 (×5): 2 g via INTRAVENOUS
  Filled 2022-11-30: qty 20
  Filled 2022-11-30 (×2): qty 2
  Filled 2022-11-30: qty 20
  Filled 2022-11-30: qty 2

## 2022-11-30 MED ORDER — SODIUM CHLORIDE 0.9 % IV SOLN
500.0000 mg | INTRAVENOUS | Status: AC
  Administered 2022-11-30 – 2022-12-04 (×5): 500 mg via INTRAVENOUS
  Filled 2022-11-30 (×2): qty 5
  Filled 2022-11-30 (×3): qty 500

## 2022-11-30 NOTE — Progress Notes (Signed)
Progress Note   Patient: Javanni Calderon. WUJ:[id number] DOB: Aug 10, 1960 DOA: 11/23/2022     7 DOS: the patient was seen and examined on 11/30/2022     Subjective  Patient seen and examined at bedside this morning Mental status slightly better today Given persistent encephalopathy CT scan of the brain was obtained but did not show any acute intracranial pathology I discussed case in detail with patient's fianc present at bedside Unable to obtain subjective information on account of mental status   Brief Narrative / Hospital Course:  Kern Aho. is a 62 y.o. male with medical history significant of alcohol abuse, GERD, hepatitis C, history of substance abuse, recurrent pancreatitis. To ED 11/22/22 w/ abd pain and bloating similar to previous pancreatitis episodes (last one was 05/2022). Typical EtOH use 3 tallboys beer per day, recently cut down to 2. (+)intermittent tremor.  05/02: admitted to hospitalist service for treatment pancreatitis, lipase nearly 1200. Following RUQ [place]. CIWA protocol, NPO and IV fluids, protonix. Pt is motivated to quit alcohol and treat withdrawals.  05/03: lipase down to 223. CIWA 1 this morning (+)tremor but improving later today. Paracentesis removed 4L. No apparent SBP.  05/04: CIWA up, valium requirement havs increased. Starting lasix + spiro. Minimal UOP, more confused/agitated, placed Foley. Transferred to stepdown for closer monitoring on phenobarbital.  05/05: remains in SDU on phenobarbital taper protocol.  05/06: Lipase trending down, has been w/o nutrition for some time, if not tolerating CLD may need NG and tube feeds. Repeat paracentesis ordered.  05/07: quite agitated, requiring ativan + phenobarb but CIWA scores better on this. Paracentesis repeated removing 2L --> IR's portal HTN clinic referral made    Consultants:  none   Procedures: 11/24/22 US Paracentesis --> 4L 11/28/22 US Paracentesis --> 2L       ASSESSMENT & PLAN:    Principal Problem:   Alcoholic pancreatitis Active Problems:   Alcohol abuse   Tobacco abuse   GERD (gastroesophageal reflux disease)   Malnutrition of moderate degree   Acute alcoholic pancreatitis   Nicotine dependence     Alcoholic pancreatitis Acute pancreatitis with lipase nearly 1200 NG tube in place with feeding ongoing Continue IV Fluids Pain control    Acute hypoxic respiratory failure secondary to community-acquired pneumonia  Sepsis likely secondary to community-acquired pneumonia Present on admission Patient may have SIRS criteria with WBC 13.0, temperature max of 103, in the setting of chest x-ray findings showing increased right interstitial lung opacities Continue ceftriaxone and azithromycin Currently requiring 5 L of intranasal oxygen We will continue to wean down as tolerated Follow-up on blood cultures  Alcohol withdrawal Delirium Tremens Currently no able to make medical decisions, lacks capacity to participate in his own care  Continue benzodiazepine + phenobarbital in stepdown unit based on CIWA protocol    Alcoholic cirrhosis w/ ascites S/p Paracentesis  Low PMN, no apparent SBP MELD 12 = 6% 37-month mortality  Child-Pugh Child Class B = Indication for transplant evaluation Will need GI follow up outpatient  Continue furosemide and spironolactone EtOH abstinence  When repeat paracentesis 11/28/2022 F/u portal HTN clinic    Delirium D/t EtOH withdrawal Ammonia very slightly above WNL, do not think hepatic encephalopathy Given constipation as well, will give lactulose for this Consider scheduled lactulose when able to tolerate orals, will trend ammonia    Pancreatic pseudocyst Repeat imaging if pain worsens    Hypocalcemia Check Ionized Ca and Mg    Hypomagnesemia  Repleting  Monitor  Alcohol abuse/dependence counseled on importance of cessation CIWA protocol Continue to monitor   GERD (gastroesophageal reflux disease) Protonix     Tobacco abuse Patient is not ready to quit Nicotine patch Counseled on potential complications of smoking extensively, counseled on importance of cessation   Constipation Bowel regimen including scheduled miralax x2, senna-docusate x2, enema prn, lactulose prn        DVT prophylaxis: lovenox    Central lines / invasive devices: none   Code Status: FULL CODE     Current Admission Status: inpatient    TOC needs / Dispo plan: none at this time, anticipate d/c back home when improved    Barriers to discharge / significant pending items: NPO --> CLD, IV fluids, CIWA, expect will be here several more days        Physical Exam Constitutional:      General: He is not in acute distress.  NG tube in place Cardiovascular:     Rate and Rhythm: Normal rate and regular rhythm.  Pulmonary:     Effort: Pulmonary effort is normal.     Breath sounds: Normal breath sounds.  Abdominal:     General: Abdomen appears full , nontender Skin:    General: Skin is warm and dry.  Neurological:     General: No focal deficit present.     Mental Status: He is alert. He is disoriented.          Data Reviewed: Laboratory results reviewed by me today showed magnesium 2.1, Sodium 137 potassium 3.5 creatinine 0.53.  Reviewed CT scan of the brain report which did not show acute intracranial pathology   Family Communication: Patient's fianc present Case discussed     Time spent: 52 minutes     Vitals:   11/30/22 0700 11/30/22 0800 11/30/22 0900 11/30/22 1000  BP:  106/72  103/73  Pulse: 84 81 90 89  Resp: 20 (!) 28 18 12   Temp:  98.5 F (36.9 C)    TempSrc:  Oral    SpO2: 96% 98% 98% 96%  Weight:      Height:        Author: Loyce Dys, MD 11/30/2022 3:57 PM  For on call review [email or web address].

## 2022-12-01 LAB — BASIC METABOLIC PANEL
Anion gap: 6 (ref 5–15)
BUN: 21 mg/dL — ABNORMAL HIGH (ref 6–20)
CO2: 25 mmol/L (ref 22–32)
Calcium: 7.8 mg/dL — ABNORMAL LOW (ref 8.9–10.3)
Chloride: 109 mmol/L (ref 98–111)
Creatinine, Ser: 0.57 mg/dL — ABNORMAL LOW (ref 0.61–1.24)
GFR, Estimated: >60 mL/min (ref >60)
Glucose, Bld: 127 mg/dL — ABNORMAL HIGH (ref 70–99)
Potassium: 3.7 mmol/L (ref 3.5–5.1)
Sodium: 140 mmol/L (ref 135–145)

## 2022-12-01 LAB — CULTURE, BLOOD (ROUTINE X 2)

## 2022-12-01 LAB — CBC WITH DIFFERENTIAL/PLATELET
Abs Immature Granulocytes: 0.07 10*3/uL (ref 0.00–0.07)
Basophils Absolute: 0.1 10*3/uL (ref 0.0–0.1)
Basophils Relative: 1 %
Eosinophils Absolute: 0.2 10*3/uL (ref 0.0–0.5)
Eosinophils Relative: 2 %
HCT: 36 % — ABNORMAL LOW (ref 39.0–52.0)
Hemoglobin: 12 g/dL — ABNORMAL LOW (ref 13.0–17.0)
Immature Granulocytes: 1 %
Lymphocytes Relative: 17 %
Lymphs Abs: 1.6 10*3/uL (ref 0.7–4.0)
MCH: 32.3 pg (ref 26.0–34.0)
MCHC: 33.3 g/dL (ref 30.0–36.0)
MCV: 97 fL (ref 80.0–100.0)
Monocytes Absolute: 1 10*3/uL (ref 0.1–1.0)
Monocytes Relative: 10 %
Neutro Abs: 6.6 10*3/uL (ref 1.7–7.7)
Neutrophils Relative %: 69 %
Platelets: 259 10*3/uL (ref 150–400)
RBC: 3.71 MIL/uL — ABNORMAL LOW (ref 4.22–5.81)
RDW: 14.2 % (ref 11.5–15.5)
WBC: 9.5 10*3/uL (ref 4.0–10.5)
nRBC: 0 % (ref 0.0–0.2)

## 2022-12-01 LAB — MAGNESIUM: Magnesium: 1.8 mg/dL (ref 1.7–2.4)

## 2022-12-01 LAB — GLUCOSE, CAPILLARY: Glucose-Capillary: 125 mg/dL — ABNORMAL HIGH (ref 70–99)

## 2022-12-01 LAB — PHOSPHORUS: Phosphorus: 1.9 mg/dL — ABNORMAL LOW (ref 2.5–4.6)

## 2022-12-01 MED ORDER — K PHOS MONO-SOD PHOS DI & MONO 155-852-130 MG PO TABS
500.0000 mg | ORAL_TABLET | ORAL | Status: AC
  Administered 2022-12-01 (×2): 500 mg
  Filled 2022-12-01 (×2): qty 2

## 2022-12-01 MED ORDER — MAGNESIUM SULFATE 2 GM/50ML IV SOLN
2.0000 g | Freq: Once | INTRAVENOUS | Status: AC
  Administered 2022-12-01: 2 g via INTRAVENOUS
  Filled 2022-12-01: qty 50

## 2022-12-01 MED ORDER — PHENOBARBITAL SODIUM 65 MG/ML IJ SOLN
32.5000 mg | Freq: Three times a day (TID) | INTRAMUSCULAR | Status: DC
  Administered 2022-12-01: 32.5 mg via INTRAVENOUS
  Filled 2022-12-01: qty 1

## 2022-12-01 MED ORDER — LORAZEPAM 2 MG/ML IJ SOLN
1.0000 mg | INTRAMUSCULAR | Status: AC | PRN
  Administered 2022-12-01: 4 mg via INTRAVENOUS
  Administered 2022-12-02: 2 mg via INTRAVENOUS
  Administered 2022-12-02: 4 mg via INTRAVENOUS
  Administered 2022-12-02: 1 mg via INTRAVENOUS
  Administered 2022-12-02: 2 mg via INTRAVENOUS
  Administered 2022-12-03: 4 mg via INTRAVENOUS
  Administered 2022-12-03: 2 mg via INTRAVENOUS
  Administered 2022-12-03: 4 mg via INTRAVENOUS
  Administered 2022-12-04 (×2): 1 mg via INTRAVENOUS
  Filled 2022-12-01: qty 1
  Filled 2022-12-01: qty 2
  Filled 2022-12-01 (×2): qty 1
  Filled 2022-12-01: qty 2
  Filled 2022-12-01 (×3): qty 1
  Filled 2022-12-01 (×2): qty 2

## 2022-12-01 MED ORDER — LORAZEPAM 1 MG PO TABS
1.0000 mg | ORAL_TABLET | ORAL | Status: AC | PRN
  Administered 2022-12-04: 1 mg via ORAL
  Filled 2022-12-01: qty 1

## 2022-12-01 NOTE — Progress Notes (Signed)
PT Cancellation Note  Patient Details Name: Luke Rogers. MRN: [id number] DOB: [birth year]   Cancelled Treatment:    Reason Eval/Treat Not Completed: Other (comment). Pt does not answer, does not follow commands. Readjusted in bed without any assistance from pt. PT to re-attempt when patient is able to meaningfully participate.   Olga Coaster PT, DPT 1:45 PM,12/01/22

## 2022-12-01 NOTE — Progress Notes (Signed)
New tube feed hanged along with tubing.

## 2022-12-01 NOTE — Plan of Care (Signed)
Continuing with plan of care. 

## 2022-12-01 NOTE — Progress Notes (Signed)
SLP Cancellation Note  Patient Details Name: Luke Rogers. MRN: [id number] DOB: October 20, 1960   Cancelled treatment:       Reason Eval/Treat Not Completed: Medical issues which prohibited therapy. Per chart review and discussion with RN, pt not appropriate for SLP evaluation or PO intake given copious thick secretions, waxing/waning mental status, and overall medical status. After discussion with RN, pt made NPO. Pt with NGT for nutrition. Will continue efforts as appropriate.  Clyde Canterbury, M.S., CCC-SLP Speech-Language Pathologist [facility] [phone] Arnette Felts)  Woodroe Chen 12/01/2022, 9:59 AM

## 2022-12-01 NOTE — Progress Notes (Signed)
Nutrition Follow-up  DOCUMENTATION CODES:   Non-severe (moderate) malnutrition in context of chronic illness  INTERVENTION:   Osmolite 1.5@65ml /hr + ProSource TF 20- Give 60ml daily via tube.   Free water flushes 30ml q4 hours to maintain tube patency   Regimen provides 2420kcal/day, 118g/day protein and 1347ml/day of free water.   MVI, thiamine and folic acid daily via tube   Daily weights   NUTRITION DIAGNOSIS:   Moderate Malnutrition related to chronic illness (hepatitis C, alcohol abuse, pancreatitis) as evidenced by mild fat depletion, moderate fat depletion, mild muscle depletion, moderate muscle depletion. -ongoing   GOAL:   Patient will meet greater than or equal to 90% of their needs -met   MONITOR:   Labs, Weight trends, TF tolerance, I & O's, Skin  ASSESSMENT:   62 y/o male with h/o etoh abuse, substance abuse, hepatitis C, GERD, hiatal hernia and cirrhosis who is admitted with acutre pancreatitis and pancreatic pseudocyst.  -Pt s/p paracentesis 5/3 & 5/7 - Pt s/p NGT placement 5/8  Pt tolerating tube feeds at goal rate. Pt is refeeding; electrolytes being monitored and replaced. No BM since 5/7; MD aware. Pt is more awake today but continues to have confusion. Pt is asking for water; SLP recommending NPO. Per chart, pt is up ~13lbs since admission; pt +3.5L on his I & Os. Recommend continue nutrition support until patient is able to meet his estimated needs via oral intake.    Medications reviewed and include: lovenox, lasix, MVI, nicotine, protonix, aldactone, thiamine, azithromycin, ceftriaxone    Labs reviewed: K 3.7 wnl, BUN 21(H), creat 0.57(L), P 1.9(L), Mg 1.8 wnl  Diet Order:   Diet Order             Diet NPO time specified  Diet effective now                  EDUCATION NEEDS:   Education needs have been addressed  Skin:  Skin Assessment: Reviewed RN Assessment (incisions abdomen s/p paracentesis')  Last BM:  5/7- type 6  Height:    Ht Readings from Last 1 Encounters:  11/22/22 6\' 2"  (1.88 m)    Weight:   Wt Readings from Last 1 Encounters:  12/01/22 79 kg    Ideal Body Weight:  86.4 kg  BMI:  Body mass index is 22.36 kg/m.  Estimated Nutritional Needs:   Kcal:  2200-2500kcal/day  Protein:  110-125g/day  Fluid:  2.4-2.7L/day  Betsey Holiday MS, RD, LDN Please refer to [facility] for RD and/or RD on-call/weekend/after hours pager

## 2022-12-01 NOTE — Progress Notes (Signed)
Progress Note   Patient: Luke Rogers. [id number]:[id number] DOB: [birth year] DOA: 11/23/2022     8 DOS: the patient was seen and examined on 12/01/2022      Subjective  Patient seen and examined at bedside this morning Status better today compared to yesterday We will attempt swallow eval by speech therapist Subjective information difficult to obtain on account of altered mental status   Brief Narrative / Hospital Course:  Luke Busto. is a 62 y.o. male with medical history significant of alcohol abuse, GERD, hepatitis C, history of substance abuse, recurrent pancreatitis. To ED 11/22/22 w/ abd pain and bloating similar to previous pancreatitis episodes (last one was 05/2022). Typical EtOH use 3 tallboys beer per day, recently cut down to 2. (+)intermittent tremor.  05/02: admitted to hospitalist service for treatment pancreatitis, lipase nearly 1200. Following RUQ [place]. CIWA protocol, NPO and IV fluids, protonix. Pt is motivated to quit alcohol and treat withdrawals.  05/03: lipase down to 223. CIWA 1 this morning (+)tremor but improving later today. Paracentesis removed 4L. No apparent SBP.  05/04: CIWA up, valium requirement havs increased. Starting lasix + spiro. Minimal UOP, more confused/agitated, placed Foley. Transferred to stepdown for closer monitoring on phenobarbital.  05/05: remains in SDU on phenobarbital taper protocol.  05/06: Lipase trending down, has been w/o nutrition for some time, if not tolerating CLD may need NG and tube feeds. Repeat paracentesis ordered.  05/07: quite agitated, requiring ativan + phenobarb but CIWA scores better on this. Paracentesis repeated removing 2L --> IR's portal HTN clinic referral made    Consultants:  none   Procedures: 11/24/22 US Paracentesis --> 4L 11/28/22 US Paracentesis --> 2L        ASSESSMENT & PLAN:   Principal Problem:   Alcoholic pancreatitis Active Problems:   Alcohol abuse   Tobacco abuse   GERD  (gastroesophageal reflux disease)   Malnutrition of moderate degree   Acute alcoholic pancreatitis   Nicotine dependence     Alcoholic pancreatitis Acute pancreatitis with lipase nearly 1200 NG tube in place with feeding ongoing Continue IV Fluids Pain control     Acute hypoxic respiratory failure secondary to community-acquired pneumonia  Sepsis likely secondary to community-acquired pneumonia Present on admission Patient may have SIRS criteria with WBC 13.0, temperature max of 103, in the setting of chest x-ray findings showing increased right interstitial lung opacities Continue ceftriaxone and azithromycin Currently requiring 5 L of intranasal oxygen We will continue to wean down as tolerated Follow-up on blood cultures   Acute metabolic encephalopathy secondary to alcohol withdrawal with Delirium Tremens Currently no able to make medical decisions, lacks capacity to participate in his own care  Continue benzodiazepine + phenobarbital in stepdown unit based on CIWA protocol Continue to monitor mental status closely Aspiration precaution   Alcoholic cirrhosis w/ ascites S/p Paracentesis  Low PMN, no apparent SBP MELD 12 = 6% 68-month mortality  Child-Pugh Child Class B = Indication for transplant evaluation Will need GI follow up outpatient  Continue furosemide and spironolactone EtOH abstinence   repeat paracentesis 11/28/2022 F/u portal HTN clinic    Delirium D/t EtOH withdrawal Ammonia very slightly above WNL, do not think hepatic encephalopathy Given constipation as well, will give lactulose  Consider scheduled lactulose  Monitor ammonia levels   Pancreatic pseudocyst Repeat imaging if pain worsens    Hypocalcemia-normal corrected for albumin levels    Hypomagnesemia  Repleting  Monitor   Alcohol abuse/dependence counseled on importance of cessation CIWA protocol  Continue to monitor   GERD (gastroesophageal reflux disease) Protonix    Tobacco  abuse Patient is not ready to quit Nicotine patch Counseled on potential complications of smoking extensively, counseled on importance of cessation   Constipation Bowel regimen including scheduled miralax x2, senna-docusate x2, enema prn, lactulose prn        DVT prophylaxis: lovenox    Central lines / invasive devices: none   Code Status: FULL CODE     Current Admission Status: inpatient    TOC needs / Dispo plan: none at this time, anticipate d/c back home when improved    Barriers to discharge / significant pending items: Still requiring NG tube feeding altered mental status     Physical Exam Constitutional:      General: He is not in acute distress.  NG tube in place Cardiovascular:     Rate and Rhythm: Normal rate and regular rhythm.  Pulmonary:     Effort: Pulmonary effort is normal.     Breath sounds: Normal breath sounds.  Abdominal:     General: Abdomen appears full , nontender Skin:    General: Skin is warm and dry.  Neurological:     General: No focal deficit present.     Mental Status: mEntal status improved today patient knows his name and he knows he is in the hospital         Data Reviewed: Laboratory results reviewed by me today showed sodium 140 potassium 3.7 creatinine 0.57 WBC 9.5 hemoglobin 12.0   Family Communication: Patient's fianc present Case discussed     Time spent: 45 minutes      Vitals:   12/01/22 1300 12/01/22 1400 12/01/22 1500 12/01/22 1600  BP:  108/73  95/65  Pulse: 90 92 91 85  Resp: 19 17 20 19   Temp:    99.3 F (37.4 C)  TempSrc:    Oral  SpO2: 97% 96% 94% 97%  Weight:      Height:          Author: Loyce Dys, MD 12/01/2022 5:54 PM  For on call review [email or web address].

## 2022-12-02 ENCOUNTER — Inpatient Hospital Stay: Payer: Medicaid Other

## 2022-12-02 LAB — CBC WITH DIFFERENTIAL/PLATELET
Abs Immature Granulocytes: 0.03 10*3/uL (ref 0.00–0.07)
Basophils Absolute: 0.1 10*3/uL (ref 0.0–0.1)
Basophils Relative: 1 %
Eosinophils Absolute: 0.5 10*3/uL (ref 0.0–0.5)
Eosinophils Relative: 6 %
HCT: 35.7 % — ABNORMAL LOW (ref 39.0–52.0)
Hemoglobin: 11.9 g/dL — ABNORMAL LOW (ref 13.0–17.0)
Immature Granulocytes: 0 %
Lymphocytes Relative: 18 %
Lymphs Abs: 1.5 10*3/uL (ref 0.7–4.0)
MCH: 32.6 pg (ref 26.0–34.0)
MCHC: 33.3 g/dL (ref 30.0–36.0)
MCV: 97.8 fL (ref 80.0–100.0)
Monocytes Absolute: 0.7 10*3/uL (ref 0.1–1.0)
Monocytes Relative: 8 %
Neutro Abs: 5.5 10*3/uL (ref 1.7–7.7)
Neutrophils Relative %: 67 %
Platelets: 260 10*3/uL (ref 150–400)
RBC: 3.65 MIL/uL — ABNORMAL LOW (ref 4.22–5.81)
RDW: 14.3 % (ref 11.5–15.5)
WBC: 8.2 10*3/uL (ref 4.0–10.5)
nRBC: 0 % (ref 0.0–0.2)

## 2022-12-02 LAB — MAGNESIUM: Magnesium: 1.8 mg/dL (ref 1.7–2.4)

## 2022-12-02 LAB — CULTURE, BLOOD (ROUTINE X 2)
Culture: NO GROWTH
Special Requests: ADEQUATE

## 2022-12-02 LAB — BASIC METABOLIC PANEL
Anion gap: 4 — ABNORMAL LOW (ref 5–15)
BUN: 19 mg/dL (ref 6–20)
CO2: 25 mmol/L (ref 22–32)
Calcium: 7.8 mg/dL — ABNORMAL LOW (ref 8.9–10.3)
Chloride: 111 mmol/L (ref 98–111)
Creatinine, Ser: 0.46 mg/dL — ABNORMAL LOW (ref 0.61–1.24)
GFR, Estimated: >60 mL/min (ref >60)
Glucose, Bld: 128 mg/dL — ABNORMAL HIGH (ref 70–99)
Potassium: 3.9 mmol/L (ref 3.5–5.1)
Sodium: 140 mmol/L (ref 135–145)

## 2022-12-02 LAB — PHOSPHORUS: Phosphorus: 2.9 mg/dL (ref 2.5–4.6)

## 2022-12-02 LAB — GLUCOSE, CAPILLARY: Glucose-Capillary: 105 mg/dL — ABNORMAL HIGH (ref 70–99)

## 2022-12-02 MED ORDER — PHENOBARBITAL SODIUM 65 MG/ML IJ SOLN
32.5000 mg | Freq: Three times a day (TID) | INTRAMUSCULAR | Status: AC
  Administered 2022-12-02 – 2022-12-03 (×4): 32.5 mg via INTRAVENOUS
  Filled 2022-12-02 (×4): qty 1

## 2022-12-02 NOTE — Progress Notes (Signed)
SLP Cancellation Note  Patient Details Name: Luke Rogers. MRN: [id number] DOB: 09-Dec-1960   Cancelled treatment:       Reason Eval/Treat Not Completed: Medical issues which prohibited therapy. Discussed with RN, pt currently sedated to due agitiation. Not medically ready for re-evaluation. Will continue to follow for readiness. Remains nutritionally supported via NG.  Rondel Baton, [place], Sports administrator Office: [phone] ASCOM: [phone]     Arlana Lindau 12/02/2022, 8:28 AM

## 2022-12-02 NOTE — Progress Notes (Signed)
Patient noted to have copious amounts of oral secretions of green and tan color.  Dr. Meriam Sprague at bedside and ordered stat CXR.

## 2022-12-02 NOTE — Progress Notes (Signed)
PT Cancellation Note  Patient Details Name: Luke Rogers. MRN: [id number] DOB: 29-May-1961   Cancelled Treatment:    Reason Eval/Treat Not Completed: Other (comment). Spoke with RN who reports pt is sedated right now secondary to agitation. Not appropriate for exertional mobility at this time. Will re-attempt.   Ree Alcalde 12/02/2022, 9:16 AM Elizabeth Palau, PT, DPT, GCS [phone]

## 2022-12-02 NOTE — Progress Notes (Signed)
Patient noted to cough up copious amounts of thick tan feed and mucous.  Dr. Meriam Sprague notified, feeds stopped at this time, patient placed on low intermittent suction, and Dr. Meriam Sprague also ordered for an abdominal ultrasound.  Will continue to monitor patient.

## 2022-12-02 NOTE — Plan of Care (Signed)
Continuing with plan of care. 

## 2022-12-02 NOTE — Progress Notes (Signed)
Progress Note   Patient: Luke Rogers. [id number]:[id number] DOB: 1960/11/10 DOA: 11/23/2022     9 DOS: the patient was seen and examined on 12/02/2022      Subjective  Patient seen and examined at bedside this morning Patient appears sedated today as he was agitated earlier and was pulling off his IV lines Subjective information difficult to obtain on account of altered mental status according to nursing staff patient was not aggressive or some moving all over in bed attempting to pull out his NG tube as well as IV lines.   Brief Narrative / Hospital Course:  From HPI "Luke Rogers. is a 62 y.o. male with medical history significant of alcohol abuse, GERD, hepatitis C, history of substance abuse, recurrent pancreatitis. To ED 11/22/22 w/ abd pain and bloating similar to previous pancreatitis episodes (last one was 05/2022). Typical EtOH use 3 tallboys beer per day, recently cut down to 2. (+)intermittent tremor.  05/02: admitted to hospitalist service for treatment pancreatitis, lipase nearly 1200. Following RUQ [place]. CIWA protocol, NPO and IV fluids, protonix. Pt is motivated to quit alcohol and treat withdrawals.  05/03: lipase down to 223. CIWA 1 this morning (+)tremor but improving later today. Paracentesis removed 4L. No apparent SBP.  05/04: CIWA up, valium requirement havs increased. Starting lasix + spiro. Minimal UOP, more confused/agitated, placed Foley. Transferred to stepdown for closer monitoring on phenobarbital.  05/05: remains in SDU on phenobarbital taper protocol.  05/06: Lipase trending down, has been w/o nutrition for some time, if not tolerating CLD may need NG and tube feeds. Repeat paracentesis ordered.  05/07: quite agitated, requiring ativan + phenobarb but CIWA scores better on this. Paracentesis repeated removing 2L --> IR's portal HTN clinic referral made "   Consultants:  none   Procedures: 11/24/22 US Paracentesis --> 4L 11/28/22 US Paracentesis --> 2L         ASSESSMENT & PLAN:   Principal Problem:   Alcoholic pancreatitis Active Problems:   Alcohol abuse   Tobacco abuse   GERD (gastroesophageal reflux disease)   Malnutrition of moderate degree   Acute alcoholic pancreatitis   Nicotine dependence     Alcoholic pancreatitis Acute pancreatitis with lipase nearly 1200 NG tube in place with feeding ongoing Continue IV Fluids Pain control     Acute hypoxic respiratory failure secondary to community-acquired pneumonia  Sepsis likely secondary to community-acquired pneumonia Present on admission Patient met SIRS criteria with WBC 13.0, temperature max of 103, in the setting of chest x-ray findings showing increased right interstitial lung opacities Continue ceftriaxone and azithromycin Currently requiring 4L of intranasal oxygen We will continue to wean down as tolerated Follow-up on blood cultures   Acute metabolic encephalopathy secondary to alcohol withdrawal with Delirium Tremens Currently no able to make medical decisions, lacks capacity to participate in his own care  Continue benzodiazepine + phenobarbital in stepdown unit based on CIWA protocol Continue to monitor mental status closely Aspiration precaution   Alcoholic cirrhosis w/ ascites S/p Paracentesis  Low PMN, no apparent SBP MELD 12 = 6% 23-month mortality  Child-Pugh Child Class B = Indication for transplant evaluation Will need GI follow up outpatient  Continue furosemide and spironolactone EtOH abstinence   repeat paracentesis 11/28/2022 F/u portal HTN clinic    Delirium D/t EtOH withdrawal Ammonia very slightly above WNL, do not think hepatic encephalopathy Given constipation as well, will give lactulose  Consider scheduled lactulose  Monitor ammonia levels   Pancreatic pseudocyst Repeat imaging if  pain worsens    Hypocalcemia-normal corrected for albumin levels     Hypomagnesemia  Repleting  Monitor   Alcohol abuse/dependence counseled  on importance of cessation CIWA protocol Continue to monitor   GERD (gastroesophageal reflux disease) Protonix    Tobacco abuse Patient is not ready to quit Nicotine patch Counseled on potential complications of smoking extensively, counseled on importance of cessation   Constipation Bowel regimen including scheduled miralax x2, senna-docusate x2, enema prn, lactulose prn        DVT prophylaxis: lovenox    Central lines / invasive devices: none   Code Status: FULL CODE     Current Admission Status: inpatient    TOC needs / Dispo plan: none at this time, anticipate d/c back home when improved    Barriers to discharge / significant pending items: Still requiring NG tube feeding altered mental status     Physical Exam Constitutional:      General: He is not in acute distress.  NG tube in place Cardiovascular:     Rate and Rhythm: Normal rate and regular rhythm.  Pulmonary:     Effort: Pulmonary effort is normal.     Breath sounds: Normal breath sounds.  Abdominal:     General: Abdomen appears full , nontender Skin:    General: Skin is warm and dry.  Neurological:     General: No focal deficit present.     Mental Status: Patient appears sedated today as he was agitated earlier and was pulling off his IV lines       Data Reviewed: Laboratory results reviewed by me today showed sodium sodium 148 potassium 3.9 creatinine 0.46 WBC 8.2 hemoglobin 11.9. Personally reviewed patient's chest x-ray which did not show any worsening of infiltrates  Family Communication: Patient's fianc present at bedside     Time spent: 50 minutes       Vitals:   12/02/22 1000 12/02/22 1100 12/02/22 1200 12/02/22 1300  BP: 97/65 (!) 89/65 105/69 109/71  Pulse: 91 88 90 92  Resp: 15 18 17 15   Temp:   98.5 F (36.9 C)   TempSrc:   Oral   SpO2: 97% 97% 95% 97%  Weight:      Height:        Author: Loyce Dys, MD 12/02/2022 2:58 PM  For on call review [email or web address].

## 2022-12-03 LAB — PHOSPHORUS: Phosphorus: 3.9 mg/dL (ref 2.5–4.6)

## 2022-12-03 LAB — HEPATIC FUNCTION PANEL
ALT: 28 U/L (ref 0–44)
AST: 72 U/L — ABNORMAL HIGH (ref 15–41)
Albumin: 2.2 g/dL — ABNORMAL LOW (ref 3.5–5.0)
Alkaline Phosphatase: 80 U/L (ref 38–126)
Bilirubin, Direct: 0.4 mg/dL — ABNORMAL HIGH (ref 0.0–0.2)
Indirect Bilirubin: 0.6 mg/dL (ref 0.3–0.9)
Total Bilirubin: 1 mg/dL (ref 0.3–1.2)
Total Protein: 7.4 g/dL (ref 6.5–8.1)

## 2022-12-03 LAB — CULTURE, BLOOD (ROUTINE X 2): Culture: NO GROWTH

## 2022-12-03 LAB — BASIC METABOLIC PANEL
Anion gap: 6 (ref 5–15)
BUN: 21 mg/dL — ABNORMAL HIGH (ref 6–20)
CO2: 25 mmol/L (ref 22–32)
Calcium: 8 mg/dL — ABNORMAL LOW (ref 8.9–10.3)
Chloride: 107 mmol/L (ref 98–111)
Creatinine, Ser: 0.4 mg/dL — ABNORMAL LOW (ref 0.61–1.24)
GFR, Estimated: >60 mL/min (ref >60)
Glucose, Bld: 97 mg/dL (ref 70–99)
Potassium: 3.9 mmol/L (ref 3.5–5.1)
Sodium: 138 mmol/L (ref 135–145)

## 2022-12-03 LAB — MAGNESIUM: Magnesium: 1.8 mg/dL (ref 1.7–2.4)

## 2022-12-03 MED ORDER — ORAL CARE MOUTH RINSE
15.0000 mL | OROMUCOSAL | Status: DC
  Administered 2022-12-03 – 2022-12-15 (×39): 15 mL via OROMUCOSAL

## 2022-12-03 MED ORDER — ORAL CARE MOUTH RINSE: 15.0000 mL | OROMUCOSAL | Status: DC | PRN

## 2022-12-03 NOTE — Progress Notes (Signed)
Progress Note   Patient: Luke Rogers. WUJ:[id number] DOB: 1961/04/07 DOA: 11/23/2022     10 DOS: the patient was seen and examined on 12/03/2022     Subjective  Patient seen and examined at bedside this morning No more complaints of vomiting from the nursing staff NG tube still in place Tube feeding being resumed Unable to obtain subjective information on account of altered mental status   Brief Narrative / Hospital Course:  From HPI "Ladanian Guzowski. is a 62 y.o. male with medical history significant of alcohol abuse, GERD, hepatitis C, history of substance abuse, recurrent pancreatitis. To ED 11/22/22 w/ abd pain and bloating similar to previous pancreatitis episodes (last one was 05/2022). Typical EtOH use 3 tallboys beer per day, recently cut down to 2. (+)intermittent tremor.  05/02: admitted to hospitalist service for treatment pancreatitis, lipase nearly 1200. Following RUQ [place]. CIWA protocol, NPO and IV fluids, protonix. Pt is motivated to quit alcohol and treat withdrawals.  05/03: lipase down to 223. CIWA 1 this morning (+)tremor but improving later today. Paracentesis removed 4L. No apparent SBP.  05/04: CIWA up, valium requirement havs increased. Starting lasix + spiro. Minimal UOP, more confused/agitated, placed Foley. Transferred to stepdown for closer monitoring on phenobarbital.  05/05: remains in SDU on phenobarbital taper protocol.  05/06: Lipase trending down, has been w/o nutrition for some time, if not tolerating CLD may need NG and tube feeds. Repeat paracentesis ordered.  05/07: quite agitated, requiring ativan + phenobarb but CIWA scores better on this. Paracentesis repeated removing 2L --> IR's portal HTN clinic referral made "   Consultants:  none   Procedures: 11/24/22 US Paracentesis --> 4L 11/28/22 US Paracentesis --> 2L        ASSESSMENT & PLAN:   Principal Problem:   Alcoholic pancreatitis Active Problems:   Alcohol abuse   Tobacco  abuse   GERD (gastroesophageal reflux disease)   Malnutrition of moderate degree   Acute alcoholic pancreatitis   Nicotine dependence     Alcoholic pancreatitis Acute pancreatitis with lipase nearly 1200 NG tube in place with feeding ongoing Continue IV Fluids Pain control     Acute hypoxic respiratory failure secondary to community-acquired pneumonia  Sepsis likely secondary to community-acquired pneumonia Present on admission Patient met SIRS criteria with WBC 13.0, temperature max of 103, in the setting of chest x-ray findings showing increased right interstitial lung opacities Continue ceftriaxone and azithromycin Currently requiring 4L of intranasal oxygen We will continue to wean down as tolerated Blood cultures showed no growth to date   Acute metabolic encephalopathy secondary to alcohol withdrawal with Delirium Tremens Currently not able to make medical decisions, lacks capacity to participate in his own care  Continue benzodiazepine + phenobarbital in stepdown unit based on CIWA protocol Continue to monitor mental status closely Aspiration precaution Goals of care discussion with patient's family   Alcoholic cirrhosis w/ ascites S/p Paracentesis  Low PMN, no apparent SBP MELD 12 = 6% 8-month mortality  Child-Pugh Child Class B = Indication for transplant evaluation Will need GI follow up outpatient  Continue furosemide and spironolactone EtOH abstinence   repeat paracentesis 11/28/2022 F/u portal HTN clinic    Delirium D/t EtOH withdrawal Ammonia very slightly above WNL, do not think hepatic encephalopathy Given constipation as well, will give lactulose  Consider scheduled lactulose  Monitor ammonia levels   Pancreatic pseudocyst Repeat imaging if pain worsens    Hypocalcemia-normal corrected for albumin levels     Hypomagnesemia  Repleting  Monitor   Alcohol abuse/dependence counseled on importance of cessation CIWA protocol Continue to monitor    GERD (gastroesophageal reflux disease) Protonix    Tobacco abuse Patient is not ready to quit Nicotine patch Counseled on potential complications of smoking extensively, counseled on importance of cessation   Constipation Bowel regimen including scheduled miralax x2, senna-docusate x2, enema prn, lactulose prn        DVT prophylaxis: lovenox    Central lines / invasive devices: none   Code Status: FULL CODE     Current Admission Status: inpatient    TOC needs / Dispo plan: none at this time, anticipate d/c back home when improved    Barriers to discharge / significant pending items: Still requiring NG tube feeding altered mental status     Physical Exam Constitutional:      General: He is not in acute distress.  NG tube in place Cardiovascular:     Rate and Rhythm: Normal rate and regular rhythm.  Pulmonary:     Effort: Pulmonary effort is normal.     Breath sounds: Normal breath sounds.  Abdominal:     General: Abdomen appears full , nontender Skin:    General: Skin is warm and dry.  Neurological:     General: No focal deficit present.     Mental Status: Still appears altered and unable to engage in conversation       Data Reviewed: Laboratory results reviewed by me today showed sodium sodium 138 potassium 3.9 creatinine 0.4   Family Communication: Patient's fianc present at bedside, I also spoke with patient's mother over the phone and requested that she comes to the hospital to also check up on patient.  At that point we will be able to make decision concerning CODE STATUS/goals of care     Time spent: 52 minutes    Vitals:   12/03/22 1500 12/03/22 1600 12/03/22 1645 12/03/22 1700  BP: 105/71 100/71  101/77  Pulse: 94 89 88 88  Resp: 15 16 14 16   Temp:  98 F (36.7 C)    TempSrc:  Oral    SpO2: 95% 97% 97% 98%  Weight:      Height:        Author: Loyce Dys, MD 12/03/2022 5:24 PM  For on call review [email or web address].

## 2022-12-03 NOTE — Progress Notes (Signed)
SLP Cancellation Note  Patient Details Name: Luke Rogers. MRN: [id number] DOB: June 26, 1961   Cancelled treatment:       Reason Eval/Treat Not Completed: Medical issues which prohibited therapy. Per nursing note, 5/11, "Patient noted to cough up copious amounts of thick tan feed and mucous.  Dr. Meriam Sprague notified, feeds stopped at this time, patient placed on low intermittent suction, and Dr. Meriam Sprague also ordered for an abdominal ultrasound..." Pt currently with NGT with intermittent low wall suctioning. Clinical swallowing evaluation deferred at this time given this above. Will continue to monitor.  Clyde Canterbury, M.S., CCC-SLP Speech-Language Pathologist [facility] [phone] Arnette Felts)   Woodroe Chen 12/03/2022, 8:17 AM

## 2022-12-03 NOTE — Progress Notes (Signed)
Per Dr. Meriam Sprague ok to start feeds back at half the rate at this time.

## 2022-12-03 NOTE — Plan of Care (Signed)
Continuing with plan of care. 

## 2022-12-03 NOTE — Progress Notes (Signed)
PT Cancellation Note  Patient Details Name: Luke Rogers. MRN: [id number] DOB: 1960/09/18   Cancelled Treatment:    Reason Eval/Treat Not Completed: Other (comment). Spoke with RN. Pt with poor cognition status and unable to actively participate in skilled evaluation at this time. 3rd attempt for evaluation. Will dc orders at this time and please re-order when pt able to formally participate in therapy.   Alexia Dinger 12/03/2022, 8:13 AM Elizabeth Palau, PT, DPT, GCS [phone]

## 2022-12-04 ENCOUNTER — Inpatient Hospital Stay: Payer: Medicaid Other

## 2022-12-04 DIAGNOSIS — R569 Unspecified convulsions: Secondary | ICD-10-CM

## 2022-12-04 DIAGNOSIS — R4182 Altered mental status, unspecified: Secondary | ICD-10-CM

## 2022-12-04 DIAGNOSIS — G9341 Metabolic encephalopathy: Secondary | ICD-10-CM

## 2022-12-04 LAB — BASIC METABOLIC PANEL
Anion gap: 7 (ref 5–15)
BUN: 22 mg/dL — ABNORMAL HIGH (ref 6–20)
CO2: 26 mmol/L (ref 22–32)
Calcium: 8 mg/dL — ABNORMAL LOW (ref 8.9–10.3)
Chloride: 105 mmol/L (ref 98–111)
Creatinine, Ser: 0.42 mg/dL — ABNORMAL LOW (ref 0.61–1.24)
GFR, Estimated: >60 mL/min (ref >60)
Glucose, Bld: 107 mg/dL — ABNORMAL HIGH (ref 70–99)
Potassium: 3.9 mmol/L (ref 3.5–5.1)
Sodium: 138 mmol/L (ref 135–145)

## 2022-12-04 LAB — CBC WITH DIFFERENTIAL/PLATELET
Abs Immature Granulocytes: 0.04 10*3/uL (ref 0.00–0.07)
Basophils Absolute: 0.1 10*3/uL (ref 0.0–0.1)
Basophils Relative: 1 %
Eosinophils Absolute: 0.5 10*3/uL (ref 0.0–0.5)
Eosinophils Relative: 5 %
HCT: 34.3 % — ABNORMAL LOW (ref 39.0–52.0)
Hemoglobin: 11.4 g/dL — ABNORMAL LOW (ref 13.0–17.0)
Immature Granulocytes: 1 %
Lymphocytes Relative: 17 %
Lymphs Abs: 1.5 10*3/uL (ref 0.7–4.0)
MCH: 32.5 pg (ref 26.0–34.0)
MCHC: 33.2 g/dL (ref 30.0–36.0)
MCV: 97.7 fL (ref 80.0–100.0)
Monocytes Absolute: 0.7 10*3/uL (ref 0.1–1.0)
Monocytes Relative: 8 %
Neutro Abs: 6.1 10*3/uL (ref 1.7–7.7)
Neutrophils Relative %: 68 %
Platelets: 255 10*3/uL (ref 150–400)
RBC: 3.51 MIL/uL — ABNORMAL LOW (ref 4.22–5.81)
RDW: 13.9 % (ref 11.5–15.5)
WBC: 8.8 10*3/uL (ref 4.0–10.5)
nRBC: 0 % (ref 0.0–0.2)

## 2022-12-04 LAB — HEPATIC FUNCTION PANEL
ALT: 26 U/L (ref 0–44)
AST: 58 U/L — ABNORMAL HIGH (ref 15–41)
Albumin: 2.1 g/dL — ABNORMAL LOW (ref 3.5–5.0)
Alkaline Phosphatase: 90 U/L (ref 38–126)
Bilirubin, Direct: 0.3 mg/dL — ABNORMAL HIGH (ref 0.0–0.2)
Indirect Bilirubin: 0.5 mg/dL (ref 0.3–0.9)
Total Bilirubin: 0.8 mg/dL (ref 0.3–1.2)
Total Protein: 7.2 g/dL (ref 6.5–8.1)

## 2022-12-04 LAB — AMMONIA: Ammonia: 56 umol/L — ABNORMAL HIGH (ref 9–35)

## 2022-12-04 MED ORDER — LACTULOSE 10 GM/15ML PO SOLN
30.0000 g | Freq: Two times a day (BID) | ORAL | Status: DC
  Administered 2022-12-04 – 2022-12-05 (×4): 30 g
  Filled 2022-12-04 (×4): qty 60

## 2022-12-04 MED ORDER — THIAMINE HCL 100 MG/ML IJ SOLN: 250.0000 mg | INTRAVENOUS | Status: DC

## 2022-12-04 MED ORDER — ACETAMINOPHEN 325 MG PO TABS
650.0000 mg | ORAL_TABLET | Freq: Four times a day (QID) | ORAL | Status: DC | PRN
  Administered 2022-12-04 – 2022-12-19 (×5): 650 mg
  Filled 2022-12-04 (×5): qty 2

## 2022-12-04 MED ORDER — THIAMINE HCL 100 MG/ML IJ SOLN: 100.0000 mg | Freq: Every day | INTRAMUSCULAR | Status: DC

## 2022-12-04 MED ORDER — ACETAMINOPHEN 650 MG RE SUPP: 650.0000 mg | Freq: Four times a day (QID) | RECTAL | Status: DC | PRN

## 2022-12-04 MED ORDER — ADULT MULTIVITAMIN W/MINERALS CH
1.0000 | ORAL_TABLET | Freq: Every day | ORAL | Status: DC
  Administered 2022-12-05: 1
  Filled 2022-12-04: qty 1

## 2022-12-04 MED ORDER — LACTULOSE 10 GM/15ML PO SOLN: 30.0000 g | Freq: Two times a day (BID) | ORAL | Status: DC

## 2022-12-04 MED ORDER — LORAZEPAM 1 MG PO TABS: 1.0000 mg | ORAL_TABLET | ORAL | Status: DC | PRN

## 2022-12-04 MED ORDER — SPIRONOLACTONE 12.5 MG HALF TABLET
12.5000 mg | ORAL_TABLET | Freq: Two times a day (BID) | ORAL | Status: DC
  Administered 2022-12-04 – 2022-12-19 (×25): 12.5 mg
  Filled 2022-12-04 (×32): qty 1

## 2022-12-04 MED ORDER — THIAMINE MONONITRATE 100 MG PO TABS: 100.0000 mg | ORAL_TABLET | Freq: Every day | ORAL | Status: DC

## 2022-12-04 MED ORDER — THIAMINE HCL 100 MG/ML IJ SOLN
500.0000 mg | Freq: Three times a day (TID) | INTRAVENOUS | Status: DC
  Administered 2022-12-04 – 2022-12-06 (×7): 500 mg via INTRAVENOUS
  Filled 2022-12-04 (×8): qty 5

## 2022-12-04 MED ORDER — OXYCODONE HCL 5 MG PO TABS
5.0000 mg | ORAL_TABLET | ORAL | Status: DC | PRN
  Administered 2022-12-04 – 2022-12-05 (×2): 5 mg
  Filled 2022-12-04 (×2): qty 1

## 2022-12-04 MED ORDER — FLUCONAZOLE 100MG IVPB
100.0000 mg | INTRAVENOUS | Status: AC
  Administered 2022-12-04 – 2022-12-08 (×5): 100 mg via INTRAVENOUS
  Filled 2022-12-04 (×5): qty 50

## 2022-12-04 MED ORDER — FUROSEMIDE 20 MG PO TABS
40.0000 mg | ORAL_TABLET | Freq: Every day | ORAL | Status: DC
  Administered 2022-12-05: 40 mg
  Filled 2022-12-04: qty 2

## 2022-12-04 MED ORDER — LORAZEPAM 2 MG/ML IJ SOLN
1.0000 mg | INTRAMUSCULAR | Status: DC | PRN
  Administered 2022-12-04: 1 mg via INTRAVENOUS
  Administered 2022-12-05 (×2): 2 mg via INTRAVENOUS
  Administered 2022-12-06: 3 mg via INTRAVENOUS
  Filled 2022-12-04 (×2): qty 1
  Filled 2022-12-04: qty 2

## 2022-12-04 NOTE — Procedures (Signed)
Patient Name: Luke Rogers.  MRN: [id number]  Epilepsy Attending: Charlsie Quest  Referring Physician/Provider: Milon Dikes, MD  Date: 12/04/2022 Duration: 27.10 mins  Patient history: 62yo M with ams getting eeg to evaluate for seizure  Level of alertness: Awake, asleep  AEDs during EEG study: Ativan  Technical aspects: This EEG study was done with scalp electrodes positioned according to the 10-20 International system of electrode placement. Electrical activity was reviewed with band pass filter of 1-70Hz , sensitivity of 7 uV/mm, display speed of 29mm/sec with a 60Hz  notched filter applied as appropriate. EEG data were recorded continuously and digitally stored.  Video monitoring was available and reviewed as appropriate.  Description: The posterior dominant rhythm consists of 8-9 Hz activity of moderate voltage (25-35 uV) seen predominantly in posterior head regions, symmetric and reactive to eye opening and eye closing. Sleep was characterized by vertex waves, sleep spindles (12 to 14 Hz), maximal frontocentral region. Hyperventilation and photic stimulation were not performed.     IMPRESSION: This study is within normal limits. No seizures or epileptiform discharges were seen throughout the recording.  A normal interictal EEG does not exclude the diagnosis of epilepsy.  Luke Rogers

## 2022-12-04 NOTE — Evaluation (Addendum)
Clinical/Bedside Swallow Evaluation Patient Details  Name: Luke Rogers. MRN: [id number] Date of Birth: 1960-09-12  Today's Date: 12/04/2022 Time: SLP Start Time (ACUTE ONLY): 1145 SLP Stop Time (ACUTE ONLY): 1245 SLP Time Calculation (min) (ACUTE ONLY): 60 min  Past Medical History:  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   Pancreatitis    Past Surgical History:  Past Surgical History:  Procedure Laterality Date   FOOT SURGERY Bilateral    As infant   INSERTION OF MESH Bilateral 09/25/2022   Procedure: INSERTION OF MESH;  Surgeon: Campbell Lerner, MD;  Location: [facility] ORS;  Service: General;  Laterality: Bilateral;   WRIST SURGERY Right    HPI:  Pt is a 62 y.o. male with medical history significant of alcohol abuse(current), GERD, hepatitis C, hiatal hernia, history of polysubstance abuse, recurrent pancreatitis, and more presents the ED with a chief complaint of bloating and sharp pain diffusely throughout his abdomen and wrapping around the right side of his body. At admit, he reported he has had nausea and vomiting 2 times per day.  The emesis is nonbloody.  His last normal meal was 2 days ago.  His last normal bowel movement was 2 days ago.  His last bout of pancreatitis was in November.  He has recently tried to cut down his drinking; ongoing tobacco use; per H&P chart note: "He has been smoking an average of .5 packs per day. He has been exposed to tobacco smoke. He has never used smokeless tobacco. He reports current alcohol use of about 63.0 standard drinks of alcohol per week. He reports current drug use. Drug: Amphetamines.".   CT of Abd/CXR: Hiatal hernia(mod) containing stomach and ascites(mod amount) as seen on recent CT  abdomen and pelvis; Acute on chronic pancreatitis, hepatic cirrhosis, lower lungs no  acute findings.    Assessment / Plan / Recommendation  Clinical Impression   Pt seen for BSE today. Pt extremely drowsy w/ intermittent alertness given stim. Mumbled, incoherent speech 2-3x. Pt did not follow any instructions. Pt's significant other arrived during session; she had questions re: his neurological recovery which were passed along to MD. She stated his Mother was not visiting today as planned. NGT present for TFs but at times suction d/t coughing up copious amounts of thick tan feed and mucous over the weekend, per chart notes.  Pt is on [place] O2 support 3L; afebrile. WBC wnl.  Pt appears to present w/ oropharyngeal phase dysphagia in setting of declined Cognitive status; unsure if related to ETOH abuse/use. Per MD note: dxs include: "Acute metabolic encephalopathy secondary to alcohol withdrawal with Delirium". Pt has POOR alertness and cannot maintain engagement in tasks, including ice chip tasks attempted. ANY Cognitive decline can impact overall awareness/timing of swallow and safety during po tasks which increases risk for aspiration, choking.  Pt's risk for aspiration currently is SEVERE and an oral diet cannot be recommended.        Pt was given oral care w/ thick, white coating noted on anterior-mod tongue; MD contacted d/t concern for Thrush and need for IV tx d/t NPO status. Pt was assessed w/ single ice chip trials to assess oral awareness and follow through. Pt consumed 7 trials of ice chips. When given verbal/tactile stim(MOD), initially noted brief oral  awareness to stimulation of ice chip placed anteriorly then reduced engagement followed; reduced oral/lingual manipulation of boluses noted. 3-4 pharyngeal swallows appreciated. No decline in O2 sats (98-99%), RR 21 during. No further trials were given d/t risk for aspiration.  OM Exam revealed significant oral weakness; open mouth posture and tongue lagging forward slightly. Confusion w/ oral care noted.          In setting of  current presentation and SEVERE risk for aspiration/aspiration pneumonia, recommend STRICT NPO STATUS w/ oral care for hygiene and stimulation of swallowing w/ NSG. MD/NSG updated. ST services will monitor pt's status and await MD to indicate pt's overall improvement for appropriateness for BSE, po's.   ST services recommends follow w/ Palliative Care for GOC and education re: impact of Cognitive decline on safety of swallowing. Recommend f/u by Neurology for assessment of Cognition moving forward; questions from Family if pt was "brain dead". Dietician following w/ NGT TFs. SLP Visit Diagnosis: Dysphagia, oropharyngeal phase (R13.12) (poor alertness)    Aspiration Risk  Severe aspiration risk;Risk for inadequate nutrition/hydration    Diet Recommendation   NPO  Medication Administration: Via alternative means    Other  Recommendations Recommended Consults:  (Palliative Care consult; Neurology consult; Dietician following) Oral Care Recommendations: Oral care QID;Staff/trained caregiver to provide oral care    Recommendations for follow up therapy are one component of a multi-disciplinary discharge planning process, led by the attending physician.  Recommendations may be updated based on patient status, additional functional criteria and insurance authorization.  Follow up Recommendations Follow physician's recommendations for discharge plan and follow up therapies      Assistance Recommended at Discharge  full  Functional Status Assessment Patient has had a recent decline in their functional status and/or demonstrates limited ability to make significant improvements in function in a reasonable and predictable amount of time  Frequency and Duration  (will monitor status for appropriateness for initiation of services)   (TBD)       Prognosis Prognosis for improved oropharyngeal function: Guarded Barriers to Reach Goals: Cognitive deficits;Language deficits;Time post onset;Severity of  deficits;Behavior;Medication Barriers/Prognosis Comment: requiring sedating medications; poor engagement and follow through during tasks; ETOH abuse; neurological decline      Swallow Study   General Date of Onset: 11/23/22 HPI: Pt is a 62 y.o. male with medical history significant of alcohol abuse(current), GERD, hepatitis C, hiatal hernia, history of polysubstance abuse, recurrent pancreatitis, and more presents the ED with a chief complaint of bloating and sharp pain diffusely throughout his abdomen and wrapping around the right side of his body. At admit, he reported he has had nausea and vomiting 2 times per day.  The emesis is nonbloody.  His last normal meal was 2 days ago.  His last normal bowel movement was 2 days ago.  His last bout of pancreatitis was in November.  He has recently tried to cut down his drinking; ongoing tobacco use; per H&P chart note: "He has been smoking an average of .5 packs per day. He has been exposed to tobacco smoke. He has never used smokeless tobacco. He reports current alcohol use of about 63.0 standard drinks of alcohol per week. He reports current drug use. Drug: Amphetamines.".   CT of Abd/CXR: Hiatal hernia(mod) containing stomach and ascites(mod amount) as seen on recent CT  abdomen and pelvis; Acute on chronic pancreatitis, hepatic cirrhosis, lower lungs no acute findings. Type of Study: Bedside Swallow Evaluation Previous Swallow Assessment: none noted Diet Prior to this Study: NPO  Temperature Spikes Noted: No (wbc 8.8) Respiratory Status: Nasal cannula (3L) History of Recent Intubation: No Behavior/Cognition: Confused;Lethargic/Drowsy;Doesn't follow directions;Requires cueing Oral Cavity Assessment: Dried secretions;Excessive secretions (thick white coating on tongue) Oral Care Completed by SLP: Yes Oral Cavity - Dentition: Missing dentition;Poor condition Vision:  (n/a) Self-Feeding Abilities: Total assist Patient Positioning: Upright in bed (needed  positioning) Baseline Vocal Quality: Not observed (mumbled) Volitional Cough: Cognitively unable to elicit Volitional Swallow: Unable to elicit    Oral/Motor/Sensory Function Overall Oral Motor/Sensory Function: Moderate impairment (generalized weakness; open mouth posture and tongue lagging noted) Facial Symmetry: Within Functional Limits Lingual Symmetry:  (lagging depending on head lean) Lingual Strength: Reduced (to pressure from tongue blade)   Ice Chips Ice chips: Impaired Presentation: Spoon (fed; 7 chips) Oral Phase Impairments: Reduced labial seal;Reduced lingual movement/coordination;Poor awareness of bolus Oral Phase Functional Implications: Prolonged oral transit Pharyngeal Phase Impairments: Suspected delayed Swallow;Decreased hyoid-laryngeal movement Other Comments: initially noted brief oral awareness to stimulation of ice chip placed anteriorly then reduced engagement followed; 3-4 pharyngeal swallows appreciated. NGT in place.   Thin Liquid Thin Liquid: Not tested    Nectar Thick Nectar Thick Liquid: Not tested   Honey Thick Honey Thick Liquid: Not tested   Puree Puree: Not tested   Solid     Solid: Not tested         Jerilynn Som, MS, CCC-SLP Speech Language Pathologist Rehab Services; [facility] -  [phone] (ascom) Ayleah Hofmeister 12/04/2022,3:42 PM

## 2022-12-04 NOTE — Progress Notes (Signed)
Eeg done 

## 2022-12-04 NOTE — Progress Notes (Signed)
Progress Note   Patient: Luke Rogers. ZOX:[id number] DOB: Oct 08, 1960 DOA: 11/23/2022     11 DOS: the patient was seen and examined on 12/04/2022     Subjective  Patient seen and examined at bedside this morning No more complaints of vomiting from the nursing staff NG tube still in place with tube feeding ongoing Given persistent encephalopathy neurologist has been consulted Unable to obtain subjective information on account of altered mental status   Brief Narrative / Hospital Course:  From HPI "Hicks Krizman. is a 62 y.o. male with medical history significant of alcohol abuse, GERD, hepatitis C, history of substance abuse, recurrent pancreatitis. To ED 11/22/22 w/ abd pain and bloating similar to previous pancreatitis episodes (last one was 05/2022). Typical EtOH use 3 tallboys beer per day, recently cut down to 2. (+)intermittent tremor.  05/02: admitted to hospitalist service for treatment pancreatitis, lipase nearly 1200. Following RUQ [place]. CIWA protocol, NPO and IV fluids, protonix. Pt is motivated to quit alcohol and treat withdrawals.  05/03: lipase down to 223. CIWA 1 this morning (+)tremor but improving later today. Paracentesis removed 4L. No apparent SBP.  05/04: CIWA up, valium requirement havs increased. Starting lasix + spiro. Minimal UOP, more confused/agitated, placed Foley. Transferred to stepdown for closer monitoring on phenobarbital.  05/05: remains in SDU on phenobarbital taper protocol.  05/06: Lipase trending down, has been w/o nutrition for some time, if not tolerating CLD may need NG and tube feeds. Repeat paracentesis ordered.  05/07: quite agitated, requiring ativan + phenobarb but CIWA scores better on this. Paracentesis repeated removing 2L --> IR's portal HTN clinic referral made "   Consultants:  none   Procedures: 11/24/22 US Paracentesis --> 4L 11/28/22 US Paracentesis --> 2L        ASSESSMENT & PLAN:   Principal Problem:   Alcoholic  pancreatitis Active Problems:   Alcohol abuse   Tobacco abuse   GERD (gastroesophageal reflux disease)   Malnutrition of moderate degree   Acute alcoholic pancreatitis   Nicotine dependence     Acute metabolic encephalopathy secondary to alcohol withdrawal with Delirium Tremens as well as possible Wernicke's encephalopathy Currently not able to make medical decisions, lacks capacity to participate in his own care  Continue benzodiazepine + phenobarbital in stepdown unit based on CIWA protocol Continue to monitor mental status closely Aspiration precaution Goals of care discussion with patient's family EEG/MRI have been requested I have consulted neurologist.  And case discussed I have also consulted palliative care to assist with goals of care discussion Patient has been on thiamine supplementation however dose have been increased to cover for possible Wernicke's encephalopathy   Alcoholic pancreatitis Acute pancreatitis with lipase nearly 1200 NG tube in place with feeding ongoing Continue IV Fluids Pain control     Acute hypoxic respiratory failure secondary to community-acquired pneumonia  Sepsis likely secondary to community-acquired pneumonia Present on admission Patient met SIRS criteria with WBC 13.0, temperature max of 103, in the setting of chest x-ray findings showing increased right interstitial lung opacities Has completed 5 days course of ceftriaxone and azithromycin Currently requiring 4L of intranasal oxygen We will continue to wean down as tolerated Blood cultures showed no growth to date     Alcoholic cirrhosis w/ ascites S/p Paracentesis  Low PMN, no apparent SBP MELD 12 = 6% 59-month mortality  Child-Pugh Child Class B = Indication for transplant evaluation Will need GI follow up outpatient  Continue furosemide and spironolactone EtOH abstinence  repeat paracentesis 11/28/2022 Continue lactulose F/u portal HTN clinic    Delirium D/t EtOH  withdrawal Ammonia very slightly above WNL, do not think hepatic encephalopathy Given constipation as well, will give lactulose  Consider scheduled lactulose  Monitor ammonia levels   Pancreatic pseudocyst Repeat imaging if pain worsens    Hypocalcemia-normal corrected for albumin levels     Hypomagnesemia  Repleting  Monitor   Alcohol abuse/dependence counseled on importance of cessation CIWA protocol Continue to monitor   GERD (gastroesophageal reflux disease) Protonix    Tobacco abuse Patient is not ready to quit Nicotine patch Counseled on potential complications of smoking extensively, counseled on importance of cessation   Constipation Bowel regimen including scheduled miralax x2, senna-docusate x2, enema prn, lactulose prn        DVT prophylaxis: lovenox    Central lines / invasive devices: none   Code Status: FULL CODE     Current Admission Status: inpatient    TOC needs / Dispo plan: none at this time, anticipate d/c back home when improved    Barriers to discharge / significant pending items: Still requiring NG tube feeding altered mental status     Physical Exam Constitutional:      General: He is not in acute distress.  NG tube in place Cardiovascular:     Rate and Rhythm: Normal rate and regular rhythm.  Pulmonary:     Effort: Pulmonary effort is normal.     Breath sounds: Normal breath sounds.  Abdominal:     General: Abdomen appears full , nontender Skin:    General: Skin is warm and dry.  Neurological:     General: No focal deficit present.     Mental Status: Still appears altered and unable to engage in conversation       Data Reviewed: Laboratory results reviewed by me today showed sodium sodium 138 potassium 3.9 creatinine 0.42 LFTs within normal limits, ammonia 56   Family Communication: Patient's fianc present at bedside   Time spent: 55 minutes    Vitals:   12/04/22 1200 12/04/22 1300 12/04/22 1400 12/04/22 1605  BP:  95/65 (!) 88/63 (!) 83/47 117/76  Pulse: 94 95 96 94  Resp: 18 17 17 18   Temp: 98.3 F (36.8 C)     TempSrc: Oral     SpO2: 98% 100% 100% 98%  Weight:      Height:        Author: Loyce Dys, MD 12/04/2022 4:28 PM  For on call review [email or web address].

## 2022-12-04 NOTE — Consult Note (Signed)
Neurology Consultation  Reason for Consult: Persistent encephalopathy Referring Physician: Dr. Meriam Sprague  CC: Persistent altered mental status  History is obtained from: Chart  HPI: Luke Rogers. is a 62 y.o. male past medical history of severe alcohol abuse, substance abuse with cocaine and amphetamines, history of pancreatitis and hepatitis C, alcoholic cirrhosis with ascites, admitted to the hospitalist service on 11/23/2022 for evaluation and treatment of pancreatitis with lipase nearly at 1200.  He was kept on CIWA protocol and n.p.o. on IV fluids and Protonix and was treated for alcohol withdrawal but he continues to be extremely altered for which a neurological consultation was placed. His treatment also required paracentesis.  He continued to have nausea vomiting for which he has a NG tube in place.  He remains in stepdown because of management of alcohol withdrawal and other medical issues.  He was treated with phenobarbital taper protocol for alcohol withdrawal. Primary team remains concerned that his mentation remains poor even 12 days after presentation which is past the timeline for withdrawal. Patient unable to provide any meaningful history.  No family at bedside at this time. Patient also being treated for hypoxic respiratory failure due to community-acquired pneumonia and sepsis related to the community-acquired pneumonia which was present on admission.  ROS: Unable to ascertain due to mentation  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   Pancreatitis    Family History  Problem Relation Age of Onset   Hypertension Mother    Social History:   reports that he has been smoking cigarettes. He has been smoking an average of .5 packs per day. He has been exposed to tobacco smoke. He has never used smokeless  tobacco. He reports current alcohol use of about 63.0 standard drinks of alcohol per week. He reports current drug use. Drug: Amphetamines.  Medications  Current Facility-Administered Medications:    acetaminophen (TYLENOL) tablet 650 mg, 650 mg, Per Tube, Q6H PRN **OR** acetaminophen (TYLENOL) suppository 650 mg, 650 mg, Rectal, Q6H PRN, Tressie Ellis, RPH   Chlorhexidine Gluconate Cloth 2 % PADS 6 each, 6 each, Topical, Q0600, Sunnie Nielsen, DO, 6 each at 12/04/22 0513   enoxaparin (LOVENOX) injection 40 mg, 40 mg, Subcutaneous, Q24H, Sunnie Nielsen, DO, 40 mg at 12/03/22 1255   feeding supplement (OSMOLITE 1.5 CAL) liquid 1,000 mL, 1,000 mL, Per Tube, Continuous, Djan, Scarlette Calico, MD, Last Rate: 65 mL/hr at 12/04/22 1200, Infusion Verify at 12/04/22 1200   feeding supplement (PROSource TF20) liquid 60 mL, 60 mL, Per Tube, Daily, Djan, Scarlette Calico, MD, 60 mL at 12/04/22 0942   fluconazole (DIFLUCAN) IVPB 100 mg, 100 mg, Intravenous, Q24H, Djan, Scarlette Calico, MD   free water 30 mL, 30 mL, Per Tube, Q4H, Rosezetta Schlatter T, MD, 30 mL at 12/04/22 1000   [START ON 12/05/2022] furosemide (LASIX) tablet 40 mg, 40 mg, Per Tube, Daily, Tressie Ellis, RPH   lactulose (CHRONULAC) 10 GM/15ML solution 20 g, 20 g, Oral, BID PRN, Sunnie Nielsen, DO   lactulose (CHRONULAC) 10 GM/15ML solution 30 g, 30 g, Per Tube, BID, Tressie Ellis, RPH, 30 g at 12/04/22 1122   LORazepam (ATIVAN) injection 1 mg, 1 mg, Intravenous, Q4H PRN, Jaynie Bream, RPH, 1 mg at 12/01/22 1125   LORazepam (ATIVAN) tablet 1-4  mg, 1-4 mg, Oral, Q1H PRN, 1 mg at 12/04/22 0830 **OR** LORazepam (ATIVAN) injection 1-4 mg, 1-4 mg, Intravenous, Q1H PRN, Loyce Dys, MD, 1 mg at 12/04/22 1126   morphine (PF) 2 MG/ML injection 2 mg, 2 mg, Intravenous, Q2H PRN, Zierle-Ghosh, Asia B, DO, 2 mg at 12/03/22 1708   [START ON 12/05/2022] multivitamin with minerals tablet 1 tablet, 1 tablet, Per Tube, Daily, Tressie Ellis, RPH   nicotine  (NICODERM CQ - dosed in mg/24 hours) patch 21 mg, 21 mg, Transdermal, Daily, Zierle-Ghosh, Asia B, DO, 21 mg at 12/04/22 0940   ondansetron (ZOFRAN) tablet 4 mg, 4 mg, Oral, Q6H PRN, 4 mg at 11/25/22 0936 **OR** ondansetron (ZOFRAN) injection 4 mg, 4 mg, Intravenous, Q6H PRN, Zierle-Ghosh, Asia B, DO, 4 mg at 12/03/22 0802   Oral care mouth rinse, 15 mL, Mouth Rinse, PRN, Sunnie Nielsen, DO   Oral care mouth rinse, 15 mL, Mouth Rinse, 4 times per day, Rosezetta Schlatter T, MD, 15 mL at 12/04/22 0840   Oral care mouth rinse, 15 mL, Mouth Rinse, PRN, Loyce Dys, MD   oxyCODONE (Oxy IR/ROXICODONE) immediate release tablet 5 mg, 5 mg, Per Tube, Q4H PRN, Tressie Ellis, RPH   pantoprazole (PROTONIX) injection 40 mg, 40 mg, Intravenous, Q24H, Zierle-Ghosh, Asia B, DO, 40 mg at 12/04/22 0503   pneumococcal 20-valent conjugate vaccine (PREVNAR 20) injection 0.5 mL, 0.5 mL, Intramuscular, Tomorrow-1000, Alexander, Natalie, DO   senna-docusate (Senokot-S) tablet 2 tablet, 2 tablet, Oral, QHS PRN, Sunnie Nielsen, DO   spironolactone (ALDACTONE) tablet 12.5 mg, 12.5 mg, Per Tube, BID, Tressie Ellis, RPH   [START ON 12/05/2022] thiamine (VITAMIN B1) tablet 100 mg, 100 mg, Per Tube, Daily **OR** [START ON 12/05/2022] thiamine (VITAMIN B1) injection 100 mg, 100 mg, Intravenous, Daily, Tressie Ellis, RPH  Exam: Current vital signs: BP 95/65   Pulse 94   Temp 98.3 F (36.8 C) (Oral)   Resp 18   Ht 6\' 2"  (1.88 m)   Wt 68.6 kg   SpO2 98%   BMI 19.42 kg/m  Vital signs in last 24 hours: Temp:  [98 F (36.7 C)-98.5 F (36.9 C)] 98.3 F (36.8 C) (05/13 1200) Pulse Rate:  [86-104] 94 (05/13 1200) Resp:  [12-20] 18 (05/13 1200) BP: (88-116)/(59-77) 95/65 (05/13 1200) SpO2:  [95 %-100 %] 98 % (05/13 1200) Weight:  [68.6 kg] 68.6 kg (05/13 0500) General: Awake on no sedation HEENT: NG tube in place CVS: Regular rhythm Abdomen nondistended nontender Neurological exam He is awake, appears  alert.  Mumbles incomprehensibly with some sentences out of contacts that are comprehensible as below. Unable to follow simple commands consistently It did look like he wanted to raise his arms to command but kept saying "I am at my friend's place". Did not answer orientation questions correctly at all Poor attention concentration Cranial nerves: Pupils are equal round reactive to light, extraocular movements are hindered-she is unable to look to either right or left and has some movement vertically upwards.  Blinks to threat from both sides.  Face appears symmetric.  Tongue is midline. Motor examination: Difficult to assess but has antigravity strength in all 4 extremities but does not follow to keep them antigravity for 10 and 5 seconds respectively Sensation intact Coordination difficult to assess given his mentation and inability to follow commands  Labs I have reviewed labs in epic and the results pertinent to this consultation are: CBC    Component Value Date/Time   WBC  8.8 12/04/2022 0433   RBC 3.51 (L) 12/04/2022 0433   HGB 11.4 (L) 12/04/2022 0433   HCT 34.3 (L) 12/04/2022 0433   PLT 255 12/04/2022 0433   MCV 97.7 12/04/2022 0433   MCH 32.5 12/04/2022 0433   MCHC 33.2 12/04/2022 0433   RDW 13.9 12/04/2022 0433   LYMPHSABS 1.5 12/04/2022 0433   MONOABS 0.7 12/04/2022 0433   EOSABS 0.5 12/04/2022 0433   BASOSABS 0.1 12/04/2022 0433   CMP     Component Value Date/Time   NA 138 12/04/2022 0433   K 3.9 12/04/2022 0433   CL 105 12/04/2022 0433   CO2 26 12/04/2022 0433   GLUCOSE 107 (H) 12/04/2022 0433   BUN 22 (H) 12/04/2022 0433   CREATININE 0.42 (L) 12/04/2022 0433   CALCIUM 8.0 (L) 12/04/2022 0433   PROT 7.2 12/04/2022 0433   ALBUMIN 2.1 (L) 12/04/2022 0433   AST 58 (H) 12/04/2022 0433   ALT 26 12/04/2022 0433   ALKPHOS 90 12/04/2022 0433   BILITOT 0.8 12/04/2022 0433   GFRNONAA >60 12/04/2022 0433   GFRAA >60 01/12/2018 0448   Imaging I have reviewed the  images obtained: CT-head-11/30/2022-no acute changes.  Assessment:  62 year old admitted for treatment for pancreatitis, history of hepatitis C, alcoholic hepatitis, cirrhosis amongst other comorbidities as well as heavy alcohol abuse, treated with phenobarbital for alcohol withdrawal protocol, continues to be altered now day 12 of admission. He has had a complicated course with hepatitis with cirrhosis, currently acquired pneumonia causing sepsis and respiratory failure. Currently, his examination is concerning for oculomotor disturbance with limited horizontal eye movements and altered mental status raising concern for Wernicke's encephalopathy.  Recommendations: Check ammonia MRI brain w/o EEG Thiamine repletion per Wernickes protocol (was not done prior). Will follow.     -- Milon Dikes, MD Neurologist [facility] Pager: [phone]

## 2022-12-05 DIAGNOSIS — K219 Gastro-esophageal reflux disease without esophagitis: Secondary | ICD-10-CM | POA: Diagnosis not present

## 2022-12-05 DIAGNOSIS — F1010 Alcohol abuse, uncomplicated: Secondary | ICD-10-CM

## 2022-12-05 DIAGNOSIS — F17210 Nicotine dependence, cigarettes, uncomplicated: Secondary | ICD-10-CM

## 2022-12-05 DIAGNOSIS — E440 Moderate protein-calorie malnutrition: Secondary | ICD-10-CM

## 2022-12-05 DIAGNOSIS — E512 Wernicke's encephalopathy: Secondary | ICD-10-CM

## 2022-12-05 DIAGNOSIS — G928 Other toxic encephalopathy: Secondary | ICD-10-CM

## 2022-12-05 DIAGNOSIS — K852 Alcohol induced acute pancreatitis without necrosis or infection: Secondary | ICD-10-CM

## 2022-12-05 DIAGNOSIS — Z515 Encounter for palliative care: Secondary | ICD-10-CM

## 2022-12-05 LAB — CBC WITH DIFFERENTIAL/PLATELET
Abs Immature Granulocytes: 0.04 10*3/uL (ref 0.00–0.07)
Basophils Absolute: 0.1 10*3/uL (ref 0.0–0.1)
Basophils Relative: 1 %
Eosinophils Absolute: 0.5 10*3/uL (ref 0.0–0.5)
Eosinophils Relative: 6 %
HCT: 35.7 % — ABNORMAL LOW (ref 39.0–52.0)
Hemoglobin: 11.9 g/dL — ABNORMAL LOW (ref 13.0–17.0)
Immature Granulocytes: 1 %
Lymphocytes Relative: 16 %
Lymphs Abs: 1.4 10*3/uL (ref 0.7–4.0)
MCH: 32.3 pg (ref 26.0–34.0)
MCHC: 33.3 g/dL (ref 30.0–36.0)
MCV: 97 fL (ref 80.0–100.0)
Monocytes Absolute: 0.8 10*3/uL (ref 0.1–1.0)
Monocytes Relative: 9 %
Neutro Abs: 5.8 10*3/uL (ref 1.7–7.7)
Neutrophils Relative %: 67 %
Platelets: 311 10*3/uL (ref 150–400)
RBC: 3.68 MIL/uL — ABNORMAL LOW (ref 4.22–5.81)
RDW: 13.7 % (ref 11.5–15.5)
WBC: 8.6 10*3/uL (ref 4.0–10.5)
nRBC: 0 % (ref 0.0–0.2)

## 2022-12-05 LAB — HEPATIC FUNCTION PANEL
ALT: 30 U/L (ref 0–44)
AST: 70 U/L — ABNORMAL HIGH (ref 15–41)
Albumin: 2.2 g/dL — ABNORMAL LOW (ref 3.5–5.0)
Alkaline Phosphatase: 103 U/L (ref 38–126)
Bilirubin, Direct: 0.3 mg/dL — ABNORMAL HIGH (ref 0.0–0.2)
Indirect Bilirubin: 0.5 mg/dL (ref 0.3–0.9)
Total Bilirubin: 0.8 mg/dL (ref 0.3–1.2)
Total Protein: 7.9 g/dL (ref 6.5–8.1)

## 2022-12-05 LAB — BASIC METABOLIC PANEL
Anion gap: 5 (ref 5–15)
BUN: 21 mg/dL — ABNORMAL HIGH (ref 6–20)
CO2: 26 mmol/L (ref 22–32)
Calcium: 8.4 mg/dL — ABNORMAL LOW (ref 8.9–10.3)
Chloride: 108 mmol/L (ref 98–111)
Creatinine, Ser: 0.46 mg/dL — ABNORMAL LOW (ref 0.61–1.24)
GFR, Estimated: >60 mL/min (ref >60)
Glucose, Bld: 112 mg/dL — ABNORMAL HIGH (ref 70–99)
Potassium: 3.6 mmol/L (ref 3.5–5.1)
Sodium: 139 mmol/L (ref 135–145)

## 2022-12-05 NOTE — Progress Notes (Signed)
Progress Note   Patient: Luke Rogers. WGN:[id number] DOB: 1960/11/07 DOA: 11/23/2022     12 DOS: the patient was seen and examined on 12/05/2022      Subjective  Patient seen and examined at bedside this morning No more complaints of vomiting from the nursing staff NG tube still in place with tube feeding ongoing Neurologist on board and case discussed Unable to obtain subjective information on account of altered mental status   Brief Narrative / Hospital Course:  From HPI "Luke Rogers. is a 62 y.o. male with medical history significant of alcohol abuse, GERD, hepatitis C, history of substance abuse, recurrent pancreatitis. To ED 11/22/22 w/ abd pain and bloating similar to previous pancreatitis episodes (last one was 05/2022). Typical EtOH use 3 tallboys beer per day, recently cut down to 2. (+)intermittent tremor.  05/02: admitted to hospitalist service for treatment pancreatitis, lipase nearly 1200. Following RUQ [place]. CIWA protocol, NPO and IV fluids, protonix. Pt is motivated to quit alcohol and treat withdrawals.  05/03: lipase down to 223. CIWA 1 this morning (+)tremor but improving later today. Paracentesis removed 4L. No apparent SBP.  05/04: CIWA up, valium requirement havs increased. Starting lasix + spiro. Minimal UOP, more confused/agitated, placed Foley. Transferred to stepdown for closer monitoring on phenobarbital.  05/05: remains in SDU on phenobarbital taper protocol.  05/06: Lipase trending down, has been w/o nutrition for some time, if not tolerating CLD may need NG and tube feeds. Repeat paracentesis ordered.  05/07: quite agitated, requiring ativan + phenobarb but CIWA scores better on this. Paracentesis repeated removing 2L --> IR's portal HTN clinic referral made "   Consultants:  none   Procedures: 11/24/22 US Paracentesis --> 4L 11/28/22 US Paracentesis --> 2L        ASSESSMENT & PLAN:   Principal Problem:   Alcoholic pancreatitis Active  Problems:   Alcohol abuse   Tobacco abuse   GERD (gastroesophageal reflux disease)   Malnutrition of moderate degree   Acute alcoholic pancreatitis   Nicotine dependence     Acute metabolic encephalopathy secondary to alcohol withdrawal with possible Delirium Tremens as well as possible Wernicke's encephalopathy Currently not able to make medical decisions, lacks capacity to participate in his own care  Continue benzodiazepine + phenobarbital in stepdown unit based on CIWA protocol Continue to monitor mental status closely Aspiration precaution Continue goals of care discussion with patient's family EEG/MRI have been reviewed by neurologist to be within normal limits Neurologist on board and case discussed I have also consulted palliative care to assist with goals of care discussion Patient has been on thiamine supplementation however dose have been increased to cover for possible Wernicke's encephalopathy     Alcoholic pancreatitis Acute pancreatitis with lipase nearly 1200 NG tube in place with feeding ongoing Has completed a course of IV fluid Pain control     Acute hypoxic respiratory failure secondary to community-acquired pneumonia  Sepsis likely secondary to community-acquired pneumonia Present on admission Patient met SIRS criteria with WBC 13.0, temperature max of 103, in the setting of chest x-ray findings showing increased right interstitial lung opacities Has completed 5 days course of ceftriaxone and azithromycin Currently requiring 4L of intranasal oxygen We will continue to wean down as tolerated Blood cultures showed no growth to date     Alcoholic cirrhosis w/ ascites S/p Paracentesis  Low PMN, no apparent SBP MELD 12 = 6% 82-month mortality  Child-Pugh Child Class B = Indication for transplant evaluation Will need  GI follow up outpatient  Continue furosemide and spironolactone EtOH abstinence   repeat paracentesis 11/28/2022 Continue lactulose F/u portal  HTN clinic    Delirium D/t EtOH withdrawal Ammonia very slightly above WNL, do not think hepatic encephalopathy Given constipation as well, will give lactulose  Consider scheduled lactulose  Monitor ammonia levels   Pancreatic pseudocyst Repeat imaging if pain worsens    Hypocalcemia-normal corrected for albumin levels     Hypomagnesemia  Repleting  Monitor   Alcohol abuse/dependence counseled on importance of cessation CIWA protocol Continue to monitor   GERD (gastroesophageal reflux disease) Protonix    Tobacco abuse Patient is not ready to quit Nicotine patch Counseled on potential complications of smoking extensively, counseled on importance of cessation   Constipation Bowel regimen including scheduled miralax x2, senna-docusate x2, enema prn, lactulose prn        DVT prophylaxis: lovenox    Central lines / invasive devices: none   Code Status: FULL CODE     Current Admission Status: inpatient      Barriers to discharge / significant pending items: Still requiring NG tube feeding altered mental status     Physical Exam Constitutional:      General: He is not in acute distress.  NG tube in place Cardiovascular:     Rate and Rhythm: Normal rate and regular rhythm.  Pulmonary:     Effort: Pulmonary effort is normal.     Breath sounds: Normal breath sounds.  Abdominal:     General: Abdomen appears full , nontender Skin:    General: Skin is warm and dry.  Neurological:     General: No focal deficit present.     Mental Status: Still appears altered and unable to engage in conversation       Data Reviewed: Laboratory results reviewed by me today showed sodium sodium 139 potassium 3.6 creatinine 0.46 hemoglobin 11.9   Family Communication: Patient's fianc present at bedside   Time spent: 50 minutes      Vitals:   12/05/22 1300 12/05/22 1540 12/05/22 1600 12/05/22 1700  BP: 107/67 121/85 125/71 118/85  Pulse: 97 100 (!) 101 100  Resp: 14 14  20 16   Temp:   98.7 F (37.1 C)   TempSrc:   Oral   SpO2: 95% 98% 95% 97%  Weight:      Height:        Author: Loyce Dys, MD 12/05/2022 5:24 PM  For on call review [email or web address].

## 2022-12-05 NOTE — Consult Note (Signed)
Consultation Note Date: 12/05/2022 at 1015  Patient Name: Luke Rogers.  DOB: 04/07/1961  MRN: [id number]  Age / Sex: 62 y.o., male  PCP: Patient, No Pcp Per Referring Physician: Loyce Dys, MD  Reason for Consultation: Establishing goals of care  HPI/Patient Profile: 62 y.o. male  with past medical history of GERD, hepatitis C, hiatal hernia, history of substance abuse (cocaine, amphetamines), pancreatitis, current tobacco use, and EtOH abuse admitted on 11/23/2022 with bloating, nausea/vomiting, and abdominal pain.  Patient is being treated for acute pancreatitis.  NG in place for decompression.  Paracentesis performed for ascites.  Patient has completed phenobarbital taper.  Patient currently being treated for CAP.  Neurology consulted.  MRI and EEG are WNL. Thiamine taper in place to address suspected Wernicke's.   PMT was consulted to discuss goals of care..   Clinical Assessment and Goals of Care: I have reviewed medical records including EPIC notes, labs and imaging, assessed the patient and then met with patient and his girlfriend Elnita Maxwell at bedside  to discuss diagnosis prognosis, GOC, EOL wishes, disposition and options.  I introduced Palliative Medicine as specialized medical care for people living with serious illness. It focuses on providing relief from the symptoms and stress of a serious illness. The goal is to improve quality of life for both the patient and the family.  We discussed a brief life review of the patient.  Patient and Cherrelle have been together for 4 years.  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  Patient and Elnita Maxwell lives in a house adjacent to patient's parents.  Patient's mother Bonita Quin is in good health and helps care for his father who is suffering from dementia.  Patient has 1 older sister and 2 younger  brothers.  He has 1 daughter and 3 grandchildren.  As far as functional and nutritional status Cherrelle endorses patient was experiencing tremors and excessive nausea and vomiting PTA.  She denies issues with him being able to complete ADLs independently.  She shares he was not eating much but consistently drinking 2 tall boy beers every day throughout the day.  We discussed patient's current illness and what it means in the larger context of patient's on-going co-morbidities.  Education provided on alcohol use disorder, pancreatitis, Wernicke's, and thiamine taper.  I attempted to elicit values and goals of care important to the patient.  Elnita Maxwell states she has had many conversations with patient in the past where he is endorsed that he would not want resuscitative efforts in the event that his heart and his lungs stop.  Elnita Maxwell believes patient would not be accepting of a ventilator in the event that he is unable to protect his airway.    However, she recognizes that she is not Horticulturist, commercial.  She shares she is attempted to speak with patient's mother in regards to these boundaries of care but that patient's mother is resistant to these discussion we discussed that time for outcomes as  needed to help determine patient's baseline neurological function.  We reviewed that NG tube is temporary and that discussions regarding PEG tube placement may be in the near future if patient's mentation does not significantly improve.  SLP following closely.  PMT offered to meet with patient's mother and daughter (legal decision makers at this time) in order to provide education on the patient's current medical status as well as discuss goals and boundaries of care.  Cheryl plans to speak with patient's family and organize a time that family is available either in person and/or over the phone to further outline plan of care and goals.  Family is facing treatment option decisions, advanced directive, and  anticipatory care needs.    Discussed with patient/Cheryl the importance of continued conversation with family and the medical providers regarding overall plan of care and treatment options, ensuring decisions are within the context of the patient's values and GOCs.    Questions and concerns were addressed. The family was encouraged to call with questions or concerns.   PMT will continue to follow and support patient and family throughout his hospitalization.  Primary Decision Maker NEXT OF KIN  Physical Exam Vitals reviewed.  Constitutional:      General: He is not in acute distress. HENT:     Head: Normocephalic.  Cardiovascular:     Rate and Rhythm: Normal rate.  Pulmonary:     Effort: Pulmonary effort is normal.  Skin:    General: Skin is warm and dry.  Neurological:     Comments: Oriented to self      Palliative Assessment/Data: 30%     Thank you for this consult. Palliative medicine will continue to follow and assist holistically.   Time Total: 75 minutes Greater than 50%  of this time was spent counseling and coordinating care related to the above assessment and plan.  Signed by: Georgiann Cocker, DNP, FNP-BC Palliative Medicine    Please contact Palliative Medicine Team phone at [phone] for questions and concerns.  For individual provider: See Loretha Stapler

## 2022-12-05 NOTE — Progress Notes (Signed)
Neurology Progress Note   S:// Patient seen and examined this morning. More awake today.   O:// Current vital signs: BP 111/81 (BP Location: Left Arm)   Pulse 91   Temp 98.3 F (36.8 C) (Oral)   Resp 18   Ht 6\' 2"  (1.88 m)   Wt 67.9 kg   SpO2 95%   BMI 19.22 kg/m  Vital signs in last 24 hours: Temp:  [98.1 F (36.7 C)-98.4 F (36.9 C)] 98.3 F (36.8 C) (05/14 0800) Pulse Rate:  [77-103] 91 (05/14 0800) Resp:  [11-21] 18 (05/14 0800) BP: (83-132)/(47-92) 111/81 (05/14 0800) SpO2:  [94 %-100 %] 95 % (05/14 0800) Weight:  [67.9 kg] 67.9 kg (05/14 0500) General: He is awake alert in no distress HEENT: Normocephalic atraumatic Lungs: Clear Cardiovascular: Regular rate rhythm Neurological exam He is awake, alert When I asked him where he is at, [facility].  When I told him he is at [facility], he said he is in the hospital all right but could not really comprehend why. He reported that he wanted to be transferred back to his home. Speech is mildly dysarthric He has poor attention concentration Cranial nerve examination: He has some more movement of the eyes, unclear if this is because he is a little bit more awake and alert today than yesterday or has shown improvement with timing-she is able to look to the right all the way to the end but crosses midline to the left but does not Dague his eyes all the way to the left.  Visual fields are full.  Face is symmetric. Motor examination with antigravity strength in all 4 extremities Sensation intact Coordination difficult to assess given his mentation but he does have a little bit of asterixis on outstretched arms  Medications  Current Facility-Administered Medications:    acetaminophen (TYLENOL) tablet 650 mg, 650 mg, Per Tube, Q6H PRN, 650 mg at 12/04/22 2156 **OR** acetaminophen (TYLENOL) suppository 650 mg, 650 mg, Rectal, Q6H PRN, Tressie Ellis, RPH   Chlorhexidine Gluconate Cloth 2 %  PADS 6 each, 6 each, Topical, Q0600, Sunnie Nielsen, DO, 6 each at 12/05/22 0517   enoxaparin (LOVENOX) injection 40 mg, 40 mg, Subcutaneous, Q24H, Sunnie Nielsen, DO, 40 mg at 12/04/22 1550   feeding supplement (OSMOLITE 1.5 CAL) liquid 1,000 mL, 1,000 mL, Per Tube, Continuous, Loyce Dys, MD, Last Rate: 65 mL/hr at 12/04/22 1830, Infusion Verify at 12/04/22 1830   feeding supplement (PROSource TF20) liquid 60 mL, 60 mL, Per Tube, Daily, Djan, Scarlette Calico, MD, 60 mL at 12/05/22 0834   fluconazole (DIFLUCAN) IVPB 100 mg, 100 mg, Intravenous, Q24H, Djan, Scarlette Calico, MD, Stopped at 12/04/22 1652   free water 30 mL, 30 mL, Per Tube, Q4H, Djan, Scarlette Calico, MD, 30 mL at 12/05/22 0900   furosemide (LASIX) tablet 40 mg, 40 mg, Per Tube, Daily, Tressie Ellis, RPH, 40 mg at 12/05/22 0900   lactulose (CHRONULAC) 10 GM/15ML solution 20 g, 20 g, Oral, BID PRN, Sunnie Nielsen, DO   lactulose (CHRONULAC) 10 GM/15ML solution 30 g, 30 g, Per Tube, BID, Tressie Ellis, RPH, 30 g at 12/05/22 0900   LORazepam (ATIVAN) injection 1 mg, 1 mg, Intravenous, Q4H PRN, Jaynie Bream, RPH, 1 mg at 12/04/22 1555   LORazepam (ATIVAN) tablet 1-4 mg, 1-4 mg, Oral, Q1H PRN **OR** LORazepam (ATIVAN) injection 1-4 mg, 1-4 mg, Intravenous, Q1H PRN, Loyce Dys, MD, 1 mg at 12/04/22 1709  morphine (PF) 2 MG/ML injection 2 mg, 2 mg, Intravenous, Q2H PRN, Zierle-Ghosh, Asia B, DO, 2 mg at 12/03/22 1708   multivitamin with minerals tablet 1 tablet, 1 tablet, Per Tube, Daily, Tressie Ellis, RPH, 1 tablet at 12/05/22 0900   nicotine (NICODERM CQ - dosed in mg/24 hours) patch 21 mg, 21 mg, Transdermal, Daily, Zierle-Ghosh, Asia B, DO, 21 mg at 12/05/22 0900   ondansetron (ZOFRAN) tablet 4 mg, 4 mg, Oral, Q6H PRN, 4 mg at 11/25/22 0936 **OR** ondansetron (ZOFRAN) injection 4 mg, 4 mg, Intravenous, Q6H PRN, Zierle-Ghosh, Asia B, DO, 4 mg at 12/03/22 0802   Oral care mouth rinse, 15 mL, Mouth Rinse, PRN, Sunnie Nielsen, DO   Oral care mouth rinse, 15 mL, Mouth Rinse, 4 times per day, Rosezetta Schlatter T, MD, 15 mL at 12/04/22 2200   Oral care mouth rinse, 15 mL, Mouth Rinse, PRN, Loyce Dys, MD   oxyCODONE (Oxy IR/ROXICODONE) immediate release tablet 5 mg, 5 mg, Per Tube, Q4H PRN, Tressie Ellis, RPH, 5 mg at 12/05/22 1610   pantoprazole (PROTONIX) injection 40 mg, 40 mg, Intravenous, Q24H, Zierle-Ghosh, Asia B, DO, 40 mg at 12/05/22 9604   pneumococcal 20-valent conjugate vaccine (PREVNAR 20) injection 0.5 mL, 0.5 mL, Intramuscular, Tomorrow-1000, Alexander, Natalie, DO   senna-docusate (Senokot-S) tablet 2 tablet, 2 tablet, Oral, QHS PRN, Sunnie Nielsen, DO   spironolactone (ALDACTONE) tablet 12.5 mg, 12.5 mg, Per Tube, BID, Tressie Ellis, RPH, 12.5 mg at 12/05/22 0900   thiamine (VITAMIN B1) 500 mg in sodium chloride 0.9 % 50 mL IVPB, 500 mg, Intravenous, Q8H, Last Rate: 100 mL/hr at 12/05/22 0513, 500 mg at 12/05/22 0513 **FOLLOWED BY** [START ON 12/08/2022] thiamine (VITAMIN B1) 250 mg in sodium chloride 0.9 % 50 mL IVPB, 250 mg, Intravenous, Q24H **FOLLOWED BY** [START ON 12/10/2022] thiamine (VITAMIN B1) injection 100 mg, 100 mg, Intravenous, Daily, Milon Dikes, MD Labs CBC    Component Value Date/Time   WBC 8.6 12/05/2022 0438   RBC 3.68 (L) 12/05/2022 0438   HGB 11.9 (L) 12/05/2022 0438   HCT 35.7 (L) 12/05/2022 0438   PLT 311 12/05/2022 0438   MCV 97.0 12/05/2022 0438   MCH 32.3 12/05/2022 0438   MCHC 33.3 12/05/2022 0438   RDW 13.7 12/05/2022 0438   LYMPHSABS 1.4 12/05/2022 0438   MONOABS 0.8 12/05/2022 0438   EOSABS 0.5 12/05/2022 0438   BASOSABS 0.1 12/05/2022 0438    CMP     Component Value Date/Time   NA 139 12/05/2022 0438   K 3.6 12/05/2022 0438   CL 108 12/05/2022 0438   CO2 26 12/05/2022 0438   GLUCOSE 112 (H) 12/05/2022 0438   BUN 21 (H) 12/05/2022 0438   CREATININE 0.46 (L) 12/05/2022 0438   CALCIUM 8.4 (L) 12/05/2022 0438   PROT 7.9 12/05/2022 0438    ALBUMIN 2.2 (L) 12/05/2022 0438   AST 70 (H) 12/05/2022 0438   ALT 30 12/05/2022 0438   ALKPHOS 103 12/05/2022 0438   BILITOT 0.8 12/05/2022 0438   GFRNONAA >60 12/05/2022 0438   GFRAA >60 01/12/2018 0448     Lipid Panel     Component Value Date/Time   CHOL 80 11/24/2022 0007   TRIG 57 11/24/2022 0007   HDL 13 (L) 11/24/2022 0007   CHOLHDL 6.2 11/24/2022 0007   VLDL 11 11/24/2022 0007   LDLCALC 56 11/24/2022 0007   EEG: WNL, No seizures.  Ammonia level 56  Imaging I have reviewed images in epic  and the results pertinent to this consultation are: CT head 11/30/2022-no acute changes MRI brain without contrast: No acute changes   Assessment:  62 year old man admitted for pancreatitis history of hepatitis C, alcoholic hepatitis and cirrhosis amongst other comorbidities as well as history of alcohol abuse, treated with phenobarbital for alcohol withdrawal protocol, continued to be altered now day 13 and 2 admission.  Initial consultation was obtained for prolonged altered mental status. I suspect that he has a component of Warnicke's encephalopathy as well as hyperammonemia that is noted on labs yesterday which is also contributing to his altered mental status as well as poor brain reserve and history of alcoholism which is prolonging his recovery  Recommendations: Correction of hyperammoniemia per primary team Contiue thiamine repletion Supportive medical management per primary team as you are He will continue to show progress but I am unable to predict how slow or fast it might be.  He might need prolonged nursing care before he returns back to his baseline after correction of all his toxic metabolic derangements. No evidence of strokes or seizures on imaging and EEG.  -- Milon Dikes, MD Neurologist [facility] Pager: [phone]

## 2022-12-06 DIAGNOSIS — Z720 Tobacco use: Secondary | ICD-10-CM

## 2022-12-06 DIAGNOSIS — E440 Moderate protein-calorie malnutrition: Secondary | ICD-10-CM | POA: Diagnosis not present

## 2022-12-06 DIAGNOSIS — F1010 Alcohol abuse, uncomplicated: Secondary | ICD-10-CM | POA: Diagnosis not present

## 2022-12-06 DIAGNOSIS — K852 Alcohol induced acute pancreatitis without necrosis or infection: Secondary | ICD-10-CM | POA: Diagnosis not present

## 2022-12-06 DIAGNOSIS — E43 Unspecified severe protein-calorie malnutrition: Secondary | ICD-10-CM

## 2022-12-06 LAB — GLUCOSE, CAPILLARY
Glucose-Capillary: 108 mg/dL — ABNORMAL HIGH (ref 70–99)
Glucose-Capillary: 113 mg/dL — ABNORMAL HIGH (ref 70–99)
Glucose-Capillary: 100 mg/dL — ABNORMAL HIGH (ref 70–99)

## 2022-12-06 LAB — CBC WITH DIFFERENTIAL/PLATELET
Abs Immature Granulocytes: 0.03 10*3/uL (ref 0.00–0.07)
Basophils Absolute: 0.1 10*3/uL (ref 0.0–0.1)
Basophils Relative: 1 %
Eosinophils Absolute: 0.3 10*3/uL (ref 0.0–0.5)
Eosinophils Relative: 3 %
HCT: 34.1 % — ABNORMAL LOW (ref 39.0–52.0)
Hemoglobin: 11.6 g/dL — ABNORMAL LOW (ref 13.0–17.0)
Immature Granulocytes: 0 %
Lymphocytes Relative: 21 %
Lymphs Abs: 1.8 10*3/uL (ref 0.7–4.0)
MCH: 32.2 pg (ref 26.0–34.0)
MCHC: 34 g/dL (ref 30.0–36.0)
MCV: 94.7 fL (ref 80.0–100.0)
Monocytes Absolute: 0.9 10*3/uL (ref 0.1–1.0)
Monocytes Relative: 10 %
Neutro Abs: 5.8 10*3/uL (ref 1.7–7.7)
Neutrophils Relative %: 65 %
Platelets: 339 10*3/uL (ref 150–400)
RBC: 3.6 MIL/uL — ABNORMAL LOW (ref 4.22–5.81)
RDW: 13.5 % (ref 11.5–15.5)
WBC: 8.8 10*3/uL (ref 4.0–10.5)
nRBC: 0 % (ref 0.0–0.2)

## 2022-12-06 LAB — BASIC METABOLIC PANEL
Anion gap: 8 (ref 5–15)
BUN: 16 mg/dL (ref 6–20)
CO2: 23 mmol/L (ref 22–32)
Calcium: 8.3 mg/dL — ABNORMAL LOW (ref 8.9–10.3)
Chloride: 107 mmol/L (ref 98–111)
Creatinine, Ser: 0.52 mg/dL — ABNORMAL LOW (ref 0.61–1.24)
GFR, Estimated: >60 mL/min (ref >60)
Glucose, Bld: 102 mg/dL — ABNORMAL HIGH (ref 70–99)
Potassium: 3.6 mmol/L (ref 3.5–5.1)
Sodium: 138 mmol/L (ref 135–145)

## 2022-12-06 MED ORDER — DEXTROSE IN LACTATED RINGERS 5 % IV SOLN: INTRAVENOUS | Status: DC

## 2022-12-06 MED ORDER — DEXTROSE 50 % IV SOLN: 1.0000 | Freq: Once | INTRAVENOUS | Status: DC | PRN

## 2022-12-06 NOTE — Progress Notes (Signed)
Patient pulled out his NG Tube to 45 cm. Multiple attempts made to advance the tube back to 70cm without success, tube would coil up in mouth. I left the room and staff went in a few mintues late to find a tissue like substance sitting on the patients chest. Charge nurse uploaded a picture in the chart. Dr. Para March aware

## 2022-12-06 NOTE — Progress Notes (Signed)
                                                     Palliative Care Progress Note, Assessment & Plan   Patient Name: Luke Rogers.       Date: 12/06/2022 DOB: 1960/07/26  Age: 62 y.o. MRN#: [id number] Attending Physician: Sunnie Nielsen, DO Primary Care Physician: Patient, No Pcp Per Admit Date: 11/23/2022  Subjective: Patient is lying in bed with mitts in place.  His bed is in trendelenburg to aid with patient remaining safely in bed.  Patient's girlfriend is at bedside.  He does not acknowledge my presence.  He is unable to make his wishes known.  He makes eye contact with me but is unable to verbalize linear thoughts.  HPI: 62 y.o. male  with past medical history of GERD, hepatitis C, hiatal hernia, history of substance abuse (cocaine, amphetamines), pancreatitis, current tobacco use, and EtOH abuse admitted on 11/23/2022 with bloating, nausea/vomiting, and abdominal pain.   Patient is being treated for acute pancreatitis.  NG in place for decompression.  Paracentesis performed for ascites.  Patient has completed phenobarbital taper.  Patient currently being treated for CAP.  Neurology consulted.  MRI and EEG are WNL. Thiamine taper in place to address suspected Wernicke's.   5/14 - pt removed NG. Evaluation for peg placement pending.    PMT was consulted to discuss goals of care.  Summary of counseling/coordination of care: After reviewing the patient's chart and assessing the patient at bedside, I spoke with patient's girlfriend Luke Rogers and Dr. Lyn Rogers in regards to plan and goals of care.  Education provided on Warnicke's, PEG tube, and patient's overall disease trajectory.  Patient's NOK decision maker are his mother and his daughter.  Luke Rogers shares she will encourage them to come to the hospital for an in person meeting.  Dr. Lyn Rogers  and I remain available to patient's family.  Tentative plan to meet tomorrow afternoon, Thursday, May 16, with patient's family to discuss plan and goals of care.  Of note, Luke Rogers shares that she and daughter both have had discussions with patient in the past where he states he would not be accepting of resuscitative efforts.  Luke Rogers believes patient would want to be a DNR.  We reviewed that patient's wishes are at the forefront of all medical decision making and that ongoing discussions regarding boundaries and goals of care will continue.  PMT will continue to follow.   Physical Exam Vitals reviewed.  Constitutional:      General: He is not in acute distress. HENT:     Head: Normocephalic.  Cardiovascular:     Rate and Rhythm: Normal rate.     Heart sounds: Normal heart sounds.  Abdominal:     Palpations: Abdomen is soft.  Skin:    General: Skin is warm and dry.  Psychiatric:        Mood and Affect: Mood is not anxious or depressed.             Total Time 35 minutes   Challen Spainhour L. Manon Hilding, FNP-BC Palliative Medicine Team Team Phone # [phone]

## 2022-12-06 NOTE — Progress Notes (Addendum)
SLP Cancellation Note  Patient Details Name: Luke Rogers. MRN: [id number] DOB: 1961-07-01   Cancelled treatment:       Reason Eval/Treat Not Completed: Medical issues which prohibited therapy;Patient's level of consciousness;Patient not medically ready (chart reviewed; consulted NSG and met w/ GF/pt and NSG in room. Discussed status w/ Dietician also.)  Pt continues to present w/ AMS and is poorly arousable; eyes closed w/ mumbled/muttered speech. He does not follow commands; minimal, inconsistent responses. He remains NPO d/t HIGH risk for aspiration. He has not shown mental status improvement since BSE 2 days ago. He is determined to be not Cognitively aware/alert for safe assessment/intake of po trials. Dietician endorsed Severe Malnutrition. Pt pulled out his NGT last night per chart. Palliative Care is following for GOC w/ family/pt. A PEG placement has been recommended by Dietician if full scope of care is continued moving forward. SLP agrees w/ this in setting of his Cognitive decline and inability to safely/fully meet his nutrition/hydration needs orally. See NEUROLOGY NOTE re: Cognitive assessment/status in setting of ETOH abuse/illness. At this time, ST services will sign off w/ MD to reconsult ST services if/when pt is able to maintain an alert State to participate in assessment/oral intake appropriately. Recommend frequent oral care for hygiene and stimulation of swallowing. MD/NSG updated, agreed. GF at bedside updated.    ADDENDUM: if plan is for pt to receive a PEG placement moving forward, then recommend f/u w/ ST services at next venue of care for any needs.       Jerilynn Som, MS, CCC-SLP Speech Language Pathologist Rehab Services; [facility] [phone] (ascom) Yacqub Baston 12/06/2022, 2:58 PM

## 2022-12-06 NOTE — Progress Notes (Addendum)
Nutrition Follow-up  DOCUMENTATION CODES:   Severe malnutrition in context of acute illness/injury  INTERVENTION:   Recommend G-tube placement if family wishes to continue with full scope of care.   If enteral access gained:  Osmolite 1.5@65ml /hr + ProSource TF 20- Give 60ml daily via tube.   Free water flushes 30ml q4 hours to maintain tube patency   Regimen provides 2420kcal/day, 118g/day protein and 1390ml/day of free water.   MVI, thiamine and folic acid daily via tube   Daily weights   NUTRITION DIAGNOSIS:   Severe Malnutrition related to acute illness as evidenced by severe muscle depletion, severe fat depletion. -ongoing   GOAL:   Patient will meet greater than or equal to 90% of their needs -previously met with tube feeds   MONITOR:   Diet advancement, Labs, Weight trends, TF tolerance, Skin, I & O's  ASSESSMENT:   62 y/o male with h/o etoh abuse, substance abuse, hepatitis C, GERD, hiatal hernia and cirrhosis who is admitted with acutre pancreatitis and pancreatic pseudocyst.  -Pt s/p paracentesis 5/3 & 5/7 - Pt s/p NGT placement 5/8 (now removed)  Pt has been tolerating tube feeds at goal rate. NGT removed today by patient. Pt continues to have AMS and remains NPO. Would recommend G-tube placement and nutrition support if family wishes to continue with full scope of care; this was discussed with MD. Per chart, pt is down ~18lbs since admission if his bed weights are correct. Pt has been receiving lasix. Pt does have worsening muscle and fat depletions today and meets criteria for severe acute malnutrition. MD to discuss feeding tube and GOC with family today.     Medications reviewed and include: lovenox, lasix, lactulose, MVI, nicotine, protonix, aldactone, thiamine, diflucan  Labs reviewed: Na 138 wnl, K 3.6 wnl, creat 0.52(L) P 3.9 wnl, Mg 1.8 wnl- 5/12  Nutrition Follow Up Note   Flowsheet Row Most Recent Value  Orbital Region Moderate depletion   Upper Arm Region Severe depletion  Thoracic and Lumbar Region Moderate depletion  Buccal Region Moderate depletion  Temple Region Moderate depletion  Clavicle Bone Region Severe depletion  Clavicle and Acromion Bone Region Severe depletion  Scapular Bone Region Moderate depletion  Dorsal Hand Unable to assess  Patellar Region Severe depletion  Anterior Thigh Region Severe depletion  Posterior Calf Region Severe depletion  Edema (RD Assessment) None  Hair Reviewed  Eyes Reviewed  Mouth Reviewed  Skin Reviewed  Nails Reviewed   Diet Order:   Diet Order             Diet NPO time specified  Diet effective now                  EDUCATION NEEDS:   No education needs have been identified at this time  Skin:  Skin Assessment: Reviewed RN Assessment (incisions abdomen s/p paracentesis')  Last BM:  5/14- type 7  Height:   Ht Readings from Last 1 Encounters:  11/22/22 6\' 2"  (1.88 m)    Weight:   Wt Readings from Last 1 Encounters:  12/06/22 65 kg    Ideal Body Weight:  86.4 kg  BMI:  Body mass index is 18.4 kg/m.  Estimated Nutritional Needs:   Kcal:  2200-2500kcal/day  Protein:  110-125g/day  Fluid:  2.4-2.7L/day  Betsey Holiday MS, RD, LDN Please refer to [facility] for RD and/or RD on-call/weekend/after hours pager

## 2022-12-06 NOTE — Progress Notes (Signed)
PROGRESS NOTE    Luke Rogers.   KGM:[id number] DOB: Oct 30, 1960  DOA: 11/23/2022 Date of Service: 12/06/22 PCP: Patient, No Pcp Per     Brief Narrative / Hospital Course:  Luke Rogers. is a 62 y.o. male with medical history significant of alcohol abuse, GERD, hepatitis C, history of substance abuse, recurrent pancreatitis. To ED 11/22/22 w/ abd pain and bloating similar to previous pancreatitis episodes (last one was 05/2022). Typical EtOH use 3 tallboys beer per day, recently cut down to 2. (+)intermittent tremor.  05/02: admitted to hospitalist service for treatment pancreatitis, lipase nearly 1200. Following RUQ [place]. CIWA protocol, NPO and IV fluids, protonix. Pt is motivated to quit alcohol and treat withdrawals.  05/03: lipase down to 223. CIWA 1 this morning (+)tremor but improving later today. Paracentesis removed 4L. No apparent SBP.  05/04: CIWA up, valium requirement havs increased. Starting lasix + spiro. Minimal UOP, more confused/agitated, placed Foley. Transferred to stepdown for closer monitoring on phenobarbital.  05/05: remains in SDU on phenobarbital taper protocol.  05/06: Lipase trending down, has been w/o nutrition for some time, if not tolerating CLD may need NG and tube feeds. Repeat paracentesis ordered.  05/07: quite agitated, requiring ativan + phenobarb but CIWA scores better on this. Paracentesis repeated removing 2L --> IR's portal HTN clinic referral made  05/08: lethargic, not taking po, NG placed.  05/09: chest x-ray findings showing increased right interstitial lung opacities, concern for CAP/aspiration, requiring 5L O2 [place], continuing ceftriaxone and azithro  05/10: more alert, SLP still unable to work w/ him  05/11: SLP still unable to complete eval. Significant oral secretions, holding tube feeds, CXR and abd [place] 05/12: restart tube feeds half usual rate 05/13: SLP work w/ patient - severe aspiration risk, continue NPO. Neurology consulted -  thiamine repletion, concern for Wernicke's. MRI brain and EEG normal. Palliative consulted.  05/14: per neurology, anticipate will improve but this may be slow and may need prolonged nursing care.  05/15: overnight coughed up mass after he pulled NG tube, sent for pathology, NG unable to be replaced. D/w GI and GenSurg re: possible PEG vs G/J tube placement - GI will see pt, will go from there   Consultants:  none  Procedures: 11/24/22 US Paracentesis --> 4L 11/28/22 US Paracentesis --> 2L  11/29/22: NG tube placed       ASSESSMENT & PLAN:   Principal Problem:   Alcoholic pancreatitis Active Problems:   Alcohol abuse   Tobacco abuse   GERD (gastroesophageal reflux disease)   Malnutrition of moderate degree   Acute alcoholic pancreatitis   Nicotine dependence   Alcohol withdrawal Delirium Tremens Acute metabolic encephalopathy secondary to alcohol withdrawal with Delirium Tremens as well as possible Wernicke's encephalopathy,  completed benzodiazepine + phenobarbital taper in stepdown unit Neurology following, suspect may recover but this could be prolonged process.  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 = Indication for transplant evaluation Will need GI follow up outpatient  furosemide and spironolactone EtOH abstinence  Repeat paracentesis as needed  F/u portal HTN clinic w/ IR outpatient   Expectoration of questionable lung/gastric mass vs clot 05/15 overnight  See photo  Coughed this up  after pulling NG tube Sent for pathology   Encephalopathy related dysphagia  Has pulled NG tube and this was unable to be re-placed Aspiration precautions  D/w GI and GenSurg re:  possible PEG vs G/J tube placement - await recs  Acute hypoxic respiratory failure secondary to community-acquired pneumonia  Sepsis likely secondary to community-acquired pneumonia Present on admission ceftriaxone and azithromycin O2 supplementation as needed  Delirium D/t EtOH withdrawal Ammonia very slightly above WNL, do not think hepatic encephalopathy Given constipation as well, will give lactulose for this Consider scheduled lactulose, will trend ammonia   Pancreatic pseudocyst Repeat imaging if pain worsens   Hypocalcemia Check Ionized Ca and Mg   Hypomagnesemia  Repleting  Monitor  Alcohol abuse/dependence counseled on importance of cessation CIWA protocol Continue to monitor  GERD (gastroesophageal reflux disease) Protonix   Tobacco abuse Patient is not ready to quit Nicotine patch Counseled on potential complications of smoking extensively, counseled on importance of cessation  Constipation Bowel regimen including scheduled miralax x2, senna-docusate x2, enema prn, lactulose prn      DVT prophylaxis: lovenox  Pertinent IV fluids/nutrition: unable to tolerate po to get sufficient nutrition, had NG but pulled this  Central lines / invasive devices: none  Code Status: FULL CODE ACP documentation reviewed: 05/02 none on file in [facility]   Current Admission Status: inpatient  TOC needs / Dispo plan: pending clinical improvement, may need SNF/STR vs long term care  Barriers to discharge / significant pending items: possible feeding tube placement, plannign family meeting tomorrow              Subjective / Brief ROS:  Patient unable to contribute    Family Communication: spoke w/ SO, Cheryl, at bedside. Spoke w/ his mom and his daughter by pheon - planning family meeting at 2 pm tomorrow     Objective Findings:  Vitals:   12/06/22 1100 12/06/22 1200 12/06/22 1300 12/06/22 1345  BP: 109/86 105/66 92/61 111/73  Pulse: 91 90 77 84  Resp: 11  15 16 20   Temp:    98 F (36.7 C)  TempSrc:    Oral  SpO2: 99% 96% 95% 96%  Weight:      Height:        Intake/Output Summary (Last 24 hours) at 12/06/2022 1619 Last data filed at 12/06/2022 1300 Gross per 24 hour  Intake 444.93 ml  Output 1200 ml  Net -755.07 ml   Filed Weights   12/04/22 0500 12/05/22 0500 12/06/22 0500  Weight: 68.6 kg 67.9 kg 65 kg    Examination:  Physical Exam Constitutional:      General: He is not in acute distress.    Appearance: He is ill-appearing.  Cardiovascular:     Rate and Rhythm: Normal rate and regular rhythm.  Pulmonary:     Breath sounds: Normal breath sounds.  Abdominal:     General: Bowel sounds are normal.     Palpations: Abdomen is soft.     Tenderness: There is no abdominal tenderness.  Skin:    General: Skin is warm and dry.  Neurological:     Mental Status: He is disoriented.          Scheduled Medications:   Chlorhexidine Gluconate Cloth  6 each Topical Q0600   enoxaparin (LOVENOX) injection  40 mg Subcutaneous Q24H   lactulose  30 g Per Tube BID   multivitamin with minerals  1 tablet Per Tube Daily   nicotine  21 mg Transdermal Daily  mouth rinse  15 mL Mouth Rinse 4 times per day   pantoprazole (PROTONIX) IV  40 mg Intravenous Q24H   pneumococcal 20-valent conjugate vaccine  0.5 mL Intramuscular Tomorrow-1000   spironolactone  12.5 mg Per Tube BID   [START ON 12/10/2022] thiamine (VITAMIN B1) injection  100 mg Intravenous Daily    Continuous Infusions:  dextrose 5% lactated ringers 75 mL/hr at 12/06/22 1559   fluconazole (DIFLUCAN) IV 100 mg (12/06/22 1359)   thiamine (VITAMIN B1) injection 500 mg (12/06/22 1507)   Followed by   Melene Muller ON 12/08/2022] thiamine (VITAMIN B1) injection      PRN Medications:  acetaminophen **OR** acetaminophen, dextrose, lactulose, LORazepam, LORazepam **OR** LORazepam, morphine injection, ondansetron **OR** ondansetron (ZOFRAN) IV, mouth rinse, mouth rinse, oxyCODONE,  senna-docusate  Antimicrobials from admission:  Anti-infectives (From admission, onward)    Start     Dose/Rate Route Frequency Ordered Stop   12/04/22 1530  fluconazole (DIFLUCAN) IVPB 100 mg        100 mg 50 mL/hr over 60 Minutes Intravenous Every 24 hours 12/04/22 1322 12/09/22 1529   11/30/22 1000  azithromycin (ZITHROMAX) 500 mg in sodium chloride 0.9 % 250 mL IVPB        500 mg 250 mL/hr over 60 Minutes Intravenous Every 24 hours 11/30/22 0827 12/04/22 1038   11/30/22 0930  cefTRIAXone (ROCEPHIN) 2 g in sodium chloride 0.9 % 100 mL IVPB        2 g 200 mL/hr over 30 Minutes Intravenous Every 24 hours 11/30/22 0827 12/04/22 1610           Data Reviewed:  I have personally reviewed the following...  CBC: Recent Labs  Lab 12/01/22 0544 12/02/22 0419 12/04/22 0433 12/05/22 0438 12/06/22 0438  WBC 9.5 8.2 8.8 8.6 8.8  NEUTROABS 6.6 5.5 6.1 5.8 5.8  HGB 12.0* 11.9* 11.4* 11.9* 11.6*  HCT 36.0* 35.7* 34.3* 35.7* 34.1*  MCV 97.0 97.8 97.7 97.0 94.7  PLT 259 260 255 311 339   Basic Metabolic Panel: Recent Labs  Lab 11/29/22 2039 11/30/22 0416 11/30/22 0416 12/01/22 0544 12/02/22 0419 12/03/22 0428 12/04/22 0433 12/05/22 0438 12/06/22 0438  NA  --  137   < > 140 140 138 138 139 138  K  --  3.5   < > 3.7 3.9 3.9 3.9 3.6 3.6  CL  --  104   < > 109 111 107 105 108 107  CO2  --  27   < > 25 25 25 26 26 23   GLUCOSE  --  143*   < > 127* 128* 97 107* 112* 102*  BUN  --  22*   < > 21* 19 21* 22* 21* 16  CREATININE  --  0.53*   < > 0.57* 0.46* 0.40* 0.42* 0.46* 0.52*  CALCIUM  --  7.7*   < > 7.8* 7.8* 8.0* 8.0* 8.4* 8.3*  MG 2.1 2.1  --  1.8 1.8 1.8  --   --   --   PHOS  --  3.7  --  1.9* 2.9 3.9  --   --   --    < > = values in this interval not displayed.   GFR: Estimated Creatinine Clearance: 91.4 mL/min (A) (by C-G formula based on SCr of 0.52 mg/dL (L)). Liver Function Tests: Recent Labs  Lab 12/03/22 0428 12/04/22 0433 12/05/22 0438  AST 72* 58* 70*   ALT 28 26 30   ALKPHOS 80 90 103  BILITOT 1.0 0.8  0.8  PROT 7.4 7.2 7.9  ALBUMIN 2.2* 2.1* 2.2*   No results for input(s): "LIPASE", "AMYLASE" in the last 168 hours. Recent Labs  Lab 12/04/22 1348  AMMONIA 56*   Coagulation Profile: No results for input(s): "INR", "PROTIME" in the last 168 hours. Cardiac Enzymes: No results for input(s): "CKTOTAL", "CKMB", "CKMBINDEX", "TROPONINI" in the last 168 hours. BNP (last 3 results) No results for input(s): "PROBNP" in the last 8760 hours. HbA1C: No results for input(s): "HGBA1C" in the last 72 hours. CBG: Recent Labs  Lab 12/01/22 2347 12/02/22 0338 12/06/22 1352  GLUCAP 125* 105* 108*   Lipid Profile: No results for input(s): "CHOL", "HDL", "LDLCALC", "TRIG", "CHOLHDL", "LDLDIRECT" in the last 72 hours. Thyroid Function Tests: No results for input(s): "TSH", "T4TOTAL", "FREET4", "T3FREE", "THYROIDAB" in the last 72 hours. Anemia Panel: No results for input(s): "VITAMINB12", "FOLATE", "FERRITIN", "TIBC", "IRON", "RETICCTPCT" in the last 72 hours. Most Recent Urinalysis On File:     Component Value Date/Time   COLORURINE AMBER (A) 11/22/2022 1941   APPEARANCEUR HAZY (A) 11/22/2022 1941   LABSPEC 1.027 11/22/2022 1941   PHURINE 5.0 11/22/2022 1941   GLUCOSEU NEGATIVE 11/22/2022 1941   HGBUR NEGATIVE 11/22/2022 1941   BILIRUBINUR NEGATIVE 11/22/2022 1941   KETONESUR NEGATIVE 11/22/2022 1941   PROTEINUR NEGATIVE 11/22/2022 1941   NITRITE NEGATIVE 11/22/2022 1941   LEUKOCYTESUR NEGATIVE 11/22/2022 1941   Sepsis Labs: @LABRCNTIP (procalcitonin:4,lacticidven:4) Microbiology: Recent Results (from the past 240 hour(s))  Culture, blood (Routine X 2) w Reflex to ID Panel     Status: None   Collection Time: 11/29/22  8:39 PM   Specimen: BLOOD RIGHT ARM  Result Value Ref Range Status   Specimen Description BLOOD RIGHT ARM  Final   Special Requests   Final    BOTTLES DRAWN AEROBIC AND ANAEROBIC Blood Culture results may not be  optimal due to an excessive volume of blood received in culture bottles   Culture   Final    NO GROWTH 5 DAYS Performed at [facility], [address] [place], [place] [address]    Report Status 12/04/2022 FINAL  Final  Culture, blood (Routine X 2) w Reflex to ID Panel     Status: None   Collection Time: 11/29/22  8:46 PM   Specimen: BLOOD RIGHT HAND  Result Value Ref Range Status   Specimen Description BLOOD RIGHT HAND  Final   Special Requests   Final    BOTTLES DRAWN AEROBIC AND ANAEROBIC Blood Culture adequate volume   Culture   Final    NO GROWTH 5 DAYS Performed at [facility], [address], [place], [place] [address]    Report Status 12/04/2022 FINAL  Final      Radiology Studies last 3 days: MR BRAIN WO CONTRAST  Result Date: 12/04/2022 CLINICAL DATA:  Initial evaluation for delirium. EXAM: MRI HEAD WITHOUT CONTRAST TECHNIQUE: Multiplanar, multiecho pulse sequences of the brain and surrounding structures were obtained without intravenous contrast. COMPARISON:  Prior CT from 11/30/2022. FINDINGS: Brain: Mildly advanced cerebral atrophy for age. No focal parenchymal signal abnormality or significant cerebral white matter disease. No abnormal foci of restricted diffusion to suggest acute or subacute ischemia. Gray-white matter differentiation maintained. No areas of chronic cortical infarction. No acute or chronic intracranial blood products. No mass lesion, midline shift or mass effect. Ventricles normal size without hydrocephalus. Focal septation versus adhesion noted at the frontal horn of the left lateral ventricle. No extra-axial fluid collection. Pituitary gland and suprasellar region within  normal limits. 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 Vascular: Major intracranial vascular flow voids are maintained. 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 Skull and upper cervical spine: Craniocervical junction within  normal limits. Bone marrow signal intensity within normal limits. No scalp soft tissue abnormality. Sinuses/Orbits: Globes and orbital soft tissues within normal limits. Trace layering secretions noted within the right maxillary sinus. Paranasal sinuses are otherwise largely clear. Trace left mastoid effusion noted, of doubtful significance. Other: None. IMPRESSION: 1. No acute intracranial abnormality. 2. Mildly advanced cerebral atrophy for age. Electronically Signed   By: Rise Mu M.D.   On: 12/04/2022 21:52   EEG adult  Result Date: 12/04/2022 Charlsie Quest, MD     12/04/2022  4:41 PM Patient Name: Luke Rogers. MRN: [id number] Epilepsy Attending: Charlsie Quest Referring Physician/Provider: Milon Dikes, MD Date: 12/04/2022 Duration: 27.10 mins Patient history: 62yo M with ams getting eeg to evaluate for seizure Level of alertness: Awake, asleep AEDs during EEG study: Ativan Technical aspects: This EEG study was done with scalp electrodes positioned according to the 10-20 International system of electrode placement. Electrical activity was reviewed with band pass filter of 1-70Hz , sensitivity of 7 uV/mm, display speed of 1mm/sec with a 60Hz  notched filter applied as appropriate. EEG data were recorded continuously and digitally stored.  Video monitoring was available and reviewed as appropriate. Description: The posterior dominant rhythm consists of 8-9 Hz activity of moderate voltage (25-35 uV) seen predominantly in posterior head regions, symmetric and reactive to eye opening and eye closing. Sleep was characterized by vertex waves, sleep spindles (12 to 14 Hz), maximal frontocentral region. Hyperventilation and photic stimulation were not performed.   IMPRESSION: This study is within normal limits. No seizures or epileptiform discharges were seen throughout the recording. A normal interictal EEG does not exclude the diagnosis of epilepsy. Priyanka Annabelle Harman   [place] ASCITES (ABDOMEN  LIMITED)  Result Date: 12/02/2022 CLINICAL DATA:  Abdominal distension EXAM: LIMITED ABDOMEN ULTRASOUND FOR ASCITES TECHNIQUE: Limited ultrasound survey for ascites was performed in all four abdominal quadrants. COMPARISON:  None Available. FINDINGS: There is a small amount of fluid adjacent to the liver. No fluid identified in the right lower, left lower or left upper quadrants. IMPRESSION: Small volume perihepatic ascites. Electronically Signed   By: Emmaline Kluver M.D.   On: 12/02/2022 21:13             LOS: 13 days    Time spent: 50 min    Sunnie Nielsen, DO [facility] 12/06/2022, 4:19 PM    Dictation software may have been used to generate the above note. Typos may occur and escape review in typed/dictated notes. Please contact Dr Lyn Hollingshead directly for clarity if needed.  Staff may message me via secure chat in Epic  but this may not receive an immediate response,  please page me for urgent matters!  If 7PM-7AM, please contact night coverage [email or web address]

## 2022-12-06 NOTE — Progress Notes (Addendum)
       CROSS COVER NOTE  NAME: Luke Rogers. MRN: [id number] DOB : [birth year]    HPI/Events of Note   Report:Patient coughed up a "tissue-like substance laying on the patients chest.  Occurred after he pulled out his NG tube  On review of chart: Patient admitted on 5/2 with the following Acute metabolic encephalopathy secondary to alcohol withdrawal Alcoholic pancreatitis Acute hypoxic respiratory failure secondary to community-acquired pneumonia with sepsis Sepsis likely secondary to community-acquired pneumonia    Assessment and  Interventions   Assessment: Coughed up tissue-like substance  Plan: Specimen sent to lab for surgical pathology as bronchial specimen.  Discussed with lab technician Meryle Ready who consulted pathologist on-call Dr. Yetta Barre who advised them to place the specimen in formalin  Follow-up surgical pathology Consider pulmonology consult

## 2022-12-07 DIAGNOSIS — Z515 Encounter for palliative care: Secondary | ICD-10-CM | POA: Diagnosis not present

## 2022-12-07 DIAGNOSIS — K852 Alcohol induced acute pancreatitis without necrosis or infection: Secondary | ICD-10-CM | POA: Diagnosis not present

## 2022-12-07 DIAGNOSIS — E440 Moderate protein-calorie malnutrition: Secondary | ICD-10-CM | POA: Diagnosis not present

## 2022-12-07 LAB — GLUCOSE, CAPILLARY
Glucose-Capillary: 100 mg/dL — ABNORMAL HIGH (ref 70–99)
Glucose-Capillary: 102 mg/dL — ABNORMAL HIGH (ref 70–99)
Glucose-Capillary: 102 mg/dL — ABNORMAL HIGH (ref 70–99)
Glucose-Capillary: 105 mg/dL — ABNORMAL HIGH (ref 70–99)
Glucose-Capillary: 112 mg/dL — ABNORMAL HIGH (ref 70–99)
Glucose-Capillary: 90 mg/dL (ref 70–99)
Glucose-Capillary: 99 mg/dL (ref 70–99)

## 2022-12-07 LAB — PROTIME-INR
INR: 1.5 — ABNORMAL HIGH (ref 0.8–1.2)
Prothrombin Time: 18.6 seconds — ABNORMAL HIGH (ref 11.4–15.2)

## 2022-12-07 LAB — SURGICAL PATHOLOGY

## 2022-12-07 MED ORDER — SODIUM CHLORIDE 0.9 % IV SOLN: INTRAVENOUS | Status: DC

## 2022-12-07 NOTE — Discharge Instructions (Signed)
                  Intensive Outpatient Programs  [facility]    [facility] [address]     [address] [place],  [place]     [place], [place] [phone]      [phone]  [facility]   [facility]  (Inpatient and outpatient)  [phone] (Suboxone and Methadone) [address]           [phone]           [facility]    [facility] - Intensive Outpatient [address]     [address] [place], [place] [address]     [place], [place]  [phone]      [phone]  [facility] (Outpatient, Inpatient, [facility]  [facility] (Groups and [facility]) (insurance only) [phone]    [place], [place]          [phone]       [facility]    [facility] (for caregivers and family) [address]     [address] [place], [place]     [place], [place] [phone]      [phone]  Residential Treatment Programs  [facility]  [facility](2 years) Residential: 90 days)  [facility] [address]      [address] [place], [place]     [place], [place] [phone]      [phone] or [phone]  [facility]    [facility] [address]      [address] [place], [place]     [place], [place] [phone]      [phone]  [facility]   [facility] ([facility] [address]     [address] [place], [place] [address]     [place], [place] [phone]      [phone] Admissions: 8am-3pm M-F  [facility]: Residential Program (90 Days)              [facility]: [facility]  [place], [place]     [place],   [phone] or [phone]    (Walk in Hours over the weekend or by referral)   [facility]: [facility]:[phone] (for crisis  response 24 hours a day) 

## 2022-12-07 NOTE — Progress Notes (Signed)
                                                     Palliative Care Progress Note, Assessment & Plan   Patient Name: Luke Rogers.       Date: 12/07/2022 DOB: Oct 08, 1960  Age: 62 y.o. MRN#: [id number] Attending Physician: Sunnie Nielsen, DO Primary Care Physician: Patient, No Pcp Per Admit Date: 11/23/2022  Subjective: PT is lying in bed in NAD. Mitts in place.  He acknowledges my presence.  He is able to tell me his name and date of birth accurately.  He continues to verbalize nonlinear thoughts.  However, patient is pleasantly confused.  His significant other Elnita Maxwell is at bedside.  HPI: 61 y.o. male  with past medical history of GERD, hepatitis C, hiatal hernia, history of substance abuse (cocaine, amphetamines), pancreatitis, current tobacco use, and EtOH abuse admitted on 11/23/2022 with bloating, nausea/vomiting, and abdominal pain.   Patient is being treated for acute pancreatitis.  NG in place for decompression.  Paracentesis performed for ascites.  Patient has completed phenobarbital taper.  Patient currently being treated for CAP.  Neurology consulted.  MRI and EEG are WNL. Thiamine taper in place to address suspected Wernicke's.    5/14 - pt removed NG. Evaluation for peg placement pending.    PMT was consulted to discuss goals of care.  Summary of counseling/coordination of care: After reviewing the patient's chart and assessing the patient at bedside, I spoke with patient and Elnita Maxwell in regards to plan and goals of care.  Plan remains for family to meet today at 2 PM to discuss goals of care.  We discussed that patient has shown mild improvement in mentation from yesterday. Discussed thiamine taper will continue. Time for outcomes still needed.   Dr. Lyn Hollingshead and family met to review goals of care.  I counseled with Dr. Lyn Hollingshead after  the [facility] as I was unavailable to participate in this discussion.   As per Dr. Lyn Hollingshead, medical update given to family present.  Family has decided for patient to remain full code and full scope with acceptance of feeding tube.   GI has been consulted and peg to be placed likely tomorrow.   Attending aware that PMT will remain available to patient and family. We will monitor him peripherally and shadow his chart. PMT will re-engage if goals change, at patient/family's request, or if patient's health deteriorates during hospitalization. Please contact PMT for any further palliative needs.   Physical Exam Vitals reviewed.  Constitutional:      General: He is not in acute distress. HENT:     Head: Normocephalic.  Cardiovascular:     Rate and Rhythm: Normal rate.     Heart sounds: Normal heart sounds.  Skin:    General: Skin is warm and dry.  Neurological:     Mental Status: He is alert.     Comments: Oriented to self  Psychiatric:        Mood and Affect: Mood normal. Mood is not anxious.             Total Time 25 minutes   Marthann Abshier L. Manon Hilding, FNP-BC Palliative Medicine Team Team Phone # [phone]

## 2022-12-07 NOTE — IPAL (Signed)
  Interdisciplinary Goals of Care Family Meeting   Date carried out: 12/07/2022  Location of the meeting:  [facility]  Member's involved: Physician and Family Member or next of kin - including Luke Rogers (pt SO), pt's daughter, pt's mother (lucid), pt's father (w/ dementia), pt's brothers/sisters and nieces   Durable Power of Pensions consultant or Environmental health practitioner: daughter and mother     Discussion: We discussed goals of care for Luke Rogers. Family notes he would not want to be on long-term life support or permanently totally dependent on others for care, but he would want aggressive treatment unless his medical condition would make that treatment futile. We discussed he is quite frail but there is a chance he'd survive CPR/intubation depending on the cause of arrest - they agree that if something like COVID happens and he needs ventilator that is ok, but if he becomes worse or if he is in organ failure from something like hepatic failure then he would not want CPR/intubation. Leave FULL CODE for now but would be amenable to DNR if strongly advised by medical team.   Discussed feeding tube - PEG offered by GI. We discussed trial PEG until he improves enough to take po but there is a possibility he may not improve / may worsen. If he is going to be "a vegetable" or permanently need artifical nutrition would revisit comfort measures. 3 mos would be reasonable trial given neurologist advice that at 3 mos his neurological condition will be his new baseline (pending other complications).   Code status:   Code Status: Full Code   Disposition: Continue current acute care  Time spent for the meeting: 35 min    Luke Nielsen, DO  12/07/2022, 3:27 PM

## 2022-12-07 NOTE — Consult Note (Addendum)
GI Inpatient Consult Note  Reason for Consult: PEG Tube Consult    Attending Requesting Consult: Dr. Kelby Aline, DO  History of Present Illness: Luke Rogers. is a 62 y.o. male seen for evaluation of PEG tube consult at the request of hospitalist - Dr. Kelby Aline. Patient has a PMH of GERD with hiatal hernia, hx of chronic HCV, hx of polysubstance abuse (cocaine, amphetamines), tobacco abuse, and EtOH abuse admitted to [facility] 5/2 two weeks ago for chief complaint of bloating found to have acute on chronic pancreatitis with new diagnosis of hepatic cirrhosis with large volume ascites, acute metabolic encephalopathy 2/2 alcohol withdrawal, and sepsis 2/2 CAP. He pulled out his NG tube yesterday. GI is consulted for consideration of PEG tube placement.   Patient seen and examined this morning resting comfortably in bed. No acute events overnight. His fiance, Luke Rogers, is present in room with patient. He is able to tell me his name, date of birth, and name of his fiance at bedside. He is unable to tell me his current location, day of week, or current President. He is unable to hold conversations. It has been suspected he might have Wernicke's encephalopathy and is on high dose thiamine replacement. He is currently NPO as he is severe aspiration risk. Fiance reports they learned about diagnosis of cirrhosis last year during hospitalization for alcoholic pancreatitis. He has continued to drink EtOH on a daily basis, anywhere from 80-120 ounces of beer. Palliative care is following patient. There is family meeting planned for 2 PM this afternoon.    Past Medical History:  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   Pancreatitis     Problem List: Patient Active Problem List   Diagnosis Date Noted   Protein-calorie  malnutrition, severe 12/06/2022   Alcoholic pancreatitis 11/23/2022   Non-recurrent bilateral inguinal hernia without obstruction or gangrene 09/19/2022   Recurrent acute pancreatitis 07/17/2022   Pancreatic mass 07/17/2022   Left renal mass 07/17/2022   Hypomagnesemia 12/22/2021   GERD (gastroesophageal reflux disease) 12/22/2021   Visual hallucination    Accidental drug overdose    Hyponatremia 12/18/2021   Metabolic acidosis 12/18/2021   Hyperbilirubinemia 12/18/2021   Visual hallucinations 12/18/2021   Amphetamine intoxication (HCC) 12/18/2021   Amphetamine adverse reaction 12/18/2021   Alcohol abuse    Elevated LFTs    Thrombocytopenia (HCC)    Tobacco abuse    Acute alcoholic pancreatitis 01/17/2021   Nicotine dependence 01/17/2021   Transaminitis 01/17/2021   Malnutrition of moderate degree 01/09/2018   Acute purulent meningitis 01/08/2018   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    Past Surgical History: Past Surgical History:  Procedure Laterality Date   FOOT SURGERY Bilateral    As infant   INSERTION OF MESH Bilateral 09/25/2022   Procedure: INSERTION OF MESH;  Surgeon: Campbell Lerner, MD;  Location: [facility] ORS;  Service: General;  Laterality: Bilateral;   WRIST SURGERY Right     Allergies: Allergies  Allergen Reactions   Tramadol Nausea And Vomiting    Home Medications: Medications Prior to Admission  Medication Sig Dispense Refill Last Dose   ibuprofen (ADVIL) 800 MG tablet Take 1 tablet (800 mg total) by mouth every 8 (eight) hours as needed. (Patient not taking: Reported on 11/23/2022) 30  tablet 0 Not Taking   Home medication reconciliation was completed with the patient.   Scheduled Inpatient Medications:    enoxaparin (LOVENOX) injection  40 mg Subcutaneous Q24H   nicotine  21 mg Transdermal Daily   mouth rinse  15 mL Mouth Rinse 4 times per day   pantoprazole (PROTONIX) IV  40 mg Intravenous Q24H   pneumococcal 20-valent conjugate vaccine   0.5 mL Intramuscular Tomorrow-1000   spironolactone  12.5 mg Per Tube BID    Continuous Inpatient Infusions:    dextrose 5% lactated ringers 75 mL/hr at 12/07/22 0318   fluconazole (DIFLUCAN) IV 100 mg (12/06/22 1359)    PRN Inpatient Medications:  acetaminophen **OR** acetaminophen, dextrose, lactulose, LORazepam, morphine injection, [DISCONTINUED] ondansetron **OR** ondansetron (ZOFRAN) IV, mouth rinse, mouth rinse, senna-docusate  Family History: family history includes Hypertension in his mother.  The patient's family history is negative for inflammatory bowel disorders, GI malignancy, or solid organ transplantation.  Social History:   reports that he has been smoking cigarettes. He has been smoking an average of .5 packs per day. He has been exposed to tobacco smoke. He has never used smokeless tobacco. He reports current alcohol use of about 63.0 standard drinks of alcohol per week. He reports current drug use. Drug: Amphetamines. The patient denies ETOH, tobacco, or drug use.   Review of Systems:  Unable to obtain 2/2 patient's baseline encephalopathy    Physical Examination: BP 116/77 (BP Location: Right Arm)   Pulse 98   Temp 99.1 F (37.3 C)   Resp 20   Ht 6\' 2"  (1.88 m)   Wt 65 kg   SpO2 95%   BMI 18.40 kg/m  Gen: NAD, alert and oriented to person  HEENT: PEERLA, EOMI, Neck: supple, no JVD or thyromegaly Chest: CTA bilaterally, no wheezes, crackles, or other adventitious sounds CV: RRR, no m/g/c/r Abd: soft, NT, ND, +BS in all four quadrants; no HSM, guarding, ridigity, or rebound tenderness, no fluid wave  Ext: no edema, well perfused with 2+ pulses, Skin: no rash or lesions noted Lymph: no LAD  Data: Lab Results  Component Value Date   WBC 8.8 12/06/2022   HGB 11.6 (L) 12/06/2022   HCT 34.1 (L) 12/06/2022   MCV 94.7 12/06/2022   PLT 339 12/06/2022   Recent Labs  Lab 12/04/22 0433 12/05/22 0438 12/06/22 0438  HGB 11.4* 11.9* 11.6*   Lab Results   Component Value Date   NA 138 12/06/2022   K 3.6 12/06/2022   CL 107 12/06/2022   CO2 23 12/06/2022   BUN 16 12/06/2022   CREATININE 0.52 (L) 12/06/2022   Lab Results  Component Value Date   ALT 30 12/05/2022   AST 70 (H) 12/05/2022   ALKPHOS 103 12/05/2022   BILITOT 0.8 12/05/2022   No results for input(s): "APTT", "INR", "PTT" in the last 168 hours.  Assessment/Plan:  62 y/o Caucasian male with a PMH of GERD with hiatal hernia, hx of chronic HCV, hx of polysubstance abuse (cocaine, amphetamines), tobacco abuse, and EtOH abuse admitted to [facility] 5/2 two weeks ago for chief complaint of bloating found to have acute on chronic pancreatitis with new diagnosis of hepatic cirrhosis with large volume ascites, acute metabolic encephalopathy 2/2 alcohol withdrawal, and sepsis 2/2 CAP. GI consulted in context of consideration of PEG tube.   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 Palliative care following.  - Discussed PEG tube procedure, indications, and potential risks with fiance in room today. PEG will not help the patient recover from his illness and will not change his overall prognosis, it will just provide artificial means of nutrition. It is also known that hx of cirrhosis and ascites increases risks and may make PEG more technically challenging with greater chances of complications.  - Prognosis is guarded - Continue supportive care primary team - PEG can be placed tomorrow with Dr. Norma Fredrickson if agreed upon  I reviewed the risks  (including bleeding, perforation, infection, anesthesia complications, cardiac/respiratory complications), benefits and alternatives of PEG tube placement.  ADDENDUM 5/16 1700 - After family meeting today patient and family have agreed for PEG tube - Plan for PEG Tube Placement tomorrow in endoscopy suite with Dr. Norma Fredrickson   Thank you for the consult. Please call with questions or concerns.  Gilda Crease, PA-C [facility] [phone]

## 2022-12-07 NOTE — Progress Notes (Signed)
PROGRESS NOTE    Luke Rogers.   ZOX:[id number] DOB: 1961/01/03  DOA: 11/23/2022 Date of Service: 12/07/22 PCP: Patient, No Pcp Per     Brief Narrative / Hospital Course:  Luke Litalien. is a 62 y.o. male with medical history significant of alcohol abuse, GERD, hepatitis C, history of substance abuse, recurrent pancreatitis. To ED 11/22/22 w/ abd pain and bloating similar to previous pancreatitis episodes (last one was 05/2022). Typical EtOH use 3 tallboys beer per day, recently cut down to 2. (+)intermittent tremor.  05/02: admitted to hospitalist service for treatment pancreatitis, lipase nearly 1200. Following RUQ [place]. CIWA protocol, NPO and IV fluids, protonix. Pt is motivated to quit alcohol and treat withdrawals.  05/03: lipase down to 223. CIWA 1 this morning (+)tremor but improving later today. Paracentesis removed 4L. No apparent SBP.  05/04: CIWA up, valium requirement havs increased. Starting lasix + spiro. Minimal UOP, more confused/agitated, placed Foley. Transferred to stepdown for closer monitoring on phenobarbital.  05/05: remains in SDU on phenobarbital taper protocol.  05/06: Lipase trending down. Repeat paracentesis ordered.  05/07: quite agitated, requiring ativan + phenobarb but CIWA scores better on this. Paracentesis repeated removing 2L --> IR's portal HTN clinic referral made  05/08: lethargic, not taking po, NG placed.  05/09: CXR right interstitial lung opacities, concern for CAP/aspiration, requiring 5L O2 [place], continuing ceftriaxone and azithro  05/10: more alert, SLP still unable to work w/ him  05/11: SLP still unable to complete eval. Significant oral secretions, holding tube feeds 05/12: restart tube feeds half usual rate 05/13: SLP- severe aspiration risk, continue NPO. Neurology consulted - thiamine repletion, concern for Wernicke's. MRI brain and EEG normal. Palliative consulted.  05/14: per neurology, anticipate will improve but this may be  slow and may need prolonged nursing care.  05/15: still lethargic/confused. Overnight coughed up mass after he pulled NG tube, sent for pathology, NG unable to be replaced. D/w GI and GenSurg re: possible PEG vs G/J tube placement - GI will see pt, will go from there. Plan family meeting tomorrow  05/16: d/w neuro - may expect weeks-mos for improvement in cognitive fxn, whatever his situation at 3 mos from now is likely going to be new baseline. Plan for family meeting today. He is more alert but still very confused.   Consultants:  none  Procedures: 11/24/22 US Paracentesis --> 4L 11/28/22 US Paracentesis --> 2L  11/29/22: NG tube placed       ASSESSMENT & PLAN:   Principal Problem:   Alcoholic pancreatitis Active Problems:   Alcohol abuse   Tobacco abuse   GERD (gastroesophageal reflux disease)   Malnutrition of moderate degree   Acute alcoholic pancreatitis   Nicotine dependence   Alcohol withdrawal Delirium Tremens Acute metabolic encephalopathy secondary to alcohol withdrawal with Delirium Tremens as well as possible Wernicke's encephalopathy,  completed benzodiazepine + phenobarbital taper in stepdown unit Neurology following, suspect may recover but this could be prolonged process.  Thiamine  CIWA Aspiration precaution   Alcoholic pancreatitis with lipase nearly 1200 on admission  Fail npo trial, poor intake, NG was placed but he has pulled this   Continue IV Fluids  Alcoholic cirrhosis w/ ascites S/p multiple Paracentesis  Low PMN, no apparent SBP MELD 12 = 6% 56-month mortality  Child-Pugh Child Class B = Indication for transplant evaluation Will need GI follow up outpatient  furosemide and spironolactone EtOH abstinence  Repeat paracentesis as needed  F/u portal HTN clinic w/ IR outpatient  Expectoration of questionable lung/gastric mass vs clot 05/15 overnight  See photo  Coughed this up after pulling NG tube Sent for pathology   Encephalopathy  related dysphagia  Has pulled NG tube and this was unable to be re-placed Aspiration precautions  D/w GI - PEG can be placed tomorrow with Dr. Norma Fredrickson if agreed upon   Acute hypoxic respiratory failure secondary to community-acquired pneumonia  Sepsis likely secondary to community-acquired pneumonia Present on admission ceftriaxone and azithromycin O2 supplementation as needed  Delirium D/t EtOH withdrawal Ammonia very slightly above WNL, do not think hepatic encephalopathy Given constipation as well, will give lactulose for this Consider scheduled lactulose, will trend ammonia   Pancreatic pseudocyst Repeat imaging if pain worsens   Hypocalcemia Check Ionized Ca and Mg   Hypomagnesemia  Repleting  Monitor  Alcohol abuse/dependence counseled on importance of cessation CIWA protocol Continue to monitor  GERD (gastroesophageal reflux disease) Protonix   Tobacco abuse Patient is not ready to quit Nicotine patch Counseled on potential complications of smoking extensively, counseled on importance of cessation  Constipation Bowel regimen including scheduled miralax x2, senna-docusate x2, enema prn, lactulose prn      DVT prophylaxis: lovenox  Pertinent IV fluids/nutrition: unable to tolerate po to get sufficient nutrition, had NG but pulled this  Central lines / invasive devices: none  Code Status: FULL CODE ACP documentation reviewed: 05/02 none on file in [facility]   Current Admission Status: inpatient  TOC needs / Dispo plan: pending clinical improvement, may need SNF/STR vs long term care  Barriers to discharge / significant pending items: possible feeding tube placement, plannign family meeting tomorrow              Subjective / Brief ROS:  Patient more alert today but disoriented, speech is slurred.    Family Communication: spoke w/ SO, Elnita Maxwell, at bedside.     Objective Findings:  Vitals:   12/06/22 2035 12/06/22 2347 12/07/22 0533 12/07/22  0717  BP: 108/74 119/79 110/81 116/77  Pulse: 88 91 97 98  Resp: 18 18 18 20   Temp: 98.3 F (36.8 C) (!) 97.5 F (36.4 C) 99.1 F (37.3 C) 99.1 F (37.3 C)  TempSrc:      SpO2: 99% 99% 98% 95%  Weight:      Height:        Intake/Output Summary (Last 24 hours) at 12/07/2022 1205 Last data filed at 12/06/2022 1300 Gross per 24 hour  Intake 0 ml  Output 200 ml  Net -200 ml   Filed Weights   12/04/22 0500 12/05/22 0500 12/06/22 0500  Weight: 68.6 kg 67.9 kg 65 kg    Examination:  Physical Exam Constitutional:      General: He is not in acute distress.    Appearance: He is not ill-appearing.  Cardiovascular:     Rate and Rhythm: Normal rate and regular rhythm.  Pulmonary:     Breath sounds: Normal breath sounds.  Abdominal:     General: Bowel sounds are normal.     Palpations: Abdomen is soft.     Tenderness: There is no abdominal tenderness.  Skin:    General: Skin is warm and dry.  Neurological:     Mental Status: He is alert. He is disoriented.  Psychiatric:        Mood and Affect: Mood is not anxious.          Scheduled Medications:   enoxaparin (LOVENOX) injection  40 mg Subcutaneous Q24H   nicotine  21  mg Transdermal Daily   mouth rinse  15 mL Mouth Rinse 4 times per day   pantoprazole (PROTONIX) IV  40 mg Intravenous Q24H   pneumococcal 20-valent conjugate vaccine  0.5 mL Intramuscular Tomorrow-1000   spironolactone  12.5 mg Per Tube BID    Continuous Infusions:  dextrose 5% lactated ringers 75 mL/hr at 12/07/22 0318   fluconazole (DIFLUCAN) IV 100 mg (12/06/22 1359)    PRN Medications:  acetaminophen **OR** acetaminophen, dextrose, lactulose, LORazepam, morphine injection, [DISCONTINUED] ondansetron **OR** ondansetron (ZOFRAN) IV, mouth rinse, mouth rinse, senna-docusate  Antimicrobials from admission:  Anti-infectives (From admission, onward)    Start     Dose/Rate Route Frequency Ordered Stop   12/04/22 1530  fluconazole (DIFLUCAN) IVPB  100 mg        100 mg 50 mL/hr over 60 Minutes Intravenous Every 24 hours 12/04/22 1322 12/09/22 1529   11/30/22 1000  azithromycin (ZITHROMAX) 500 mg in sodium chloride 0.9 % 250 mL IVPB        500 mg 250 mL/hr over 60 Minutes Intravenous Every 24 hours 11/30/22 0827 12/04/22 1038   11/30/22 0930  cefTRIAXone (ROCEPHIN) 2 g in sodium chloride 0.9 % 100 mL IVPB        2 g 200 mL/hr over 30 Minutes Intravenous Every 24 hours 11/30/22 0827 12/04/22 1610           Data Reviewed:  I have personally reviewed the following...  CBC: Recent Labs  Lab 12/01/22 0544 12/02/22 0419 12/04/22 0433 12/05/22 0438 12/06/22 0438  WBC 9.5 8.2 8.8 8.6 8.8  NEUTROABS 6.6 5.5 6.1 5.8 5.8  HGB 12.0* 11.9* 11.4* 11.9* 11.6*  HCT 36.0* 35.7* 34.3* 35.7* 34.1*  MCV 97.0 97.8 97.7 97.0 94.7  PLT 259 260 255 311 339   Basic Metabolic Panel: Recent Labs  Lab 12/01/22 0544 12/02/22 0419 12/03/22 0428 12/04/22 0433 12/05/22 0438 12/06/22 0438  NA 140 140 138 138 139 138  K 3.7 3.9 3.9 3.9 3.6 3.6  CL 109 111 107 105 108 107  CO2 25 25 25 26 26 23   GLUCOSE 127* 128* 97 107* 112* 102*  BUN 21* 19 21* 22* 21* 16  CREATININE 0.57* 0.46* 0.40* 0.42* 0.46* 0.52*  CALCIUM 7.8* 7.8* 8.0* 8.0* 8.4* 8.3*  MG 1.8 1.8 1.8  --   --   --   PHOS 1.9* 2.9 3.9  --   --   --    GFR: Estimated Creatinine Clearance: 91.4 mL/min (A) (by C-G formula based on SCr of 0.52 mg/dL (L)). Liver Function Tests: Recent Labs  Lab 12/03/22 0428 12/04/22 0433 12/05/22 0438  AST 72* 58* 70*  ALT 28 26 30   ALKPHOS 80 90 103  BILITOT 1.0 0.8 0.8  PROT 7.4 7.2 7.9  ALBUMIN 2.2* 2.1* 2.2*   No results for input(s): "LIPASE", "AMYLASE" in the last 168 hours. Recent Labs  Lab 12/04/22 1348  AMMONIA 56*   Coagulation Profile: No results for input(s): "INR", "PROTIME" in the last 168 hours. Cardiac Enzymes: No results for input(s): "CKTOTAL", "CKMB", "CKMBINDEX", "TROPONINI" in the last 168 hours. BNP (last 3  results) No results for input(s): "PROBNP" in the last 8760 hours. HbA1C: No results for input(s): "HGBA1C" in the last 72 hours. CBG: Recent Labs  Lab 12/06/22 2007 12/07/22 0013 12/07/22 0535 12/07/22 0719 12/07/22 1139  GLUCAP 100* 105* 90 112* 99   Lipid Profile: No results for input(s): "CHOL", "HDL", "LDLCALC", "TRIG", "CHOLHDL", "LDLDIRECT" in the last 72 hours. Thyroid  Function Tests: No results for input(s): "TSH", "T4TOTAL", "FREET4", "T3FREE", "THYROIDAB" in the last 72 hours. Anemia Panel: No results for input(s): "VITAMINB12", "FOLATE", "FERRITIN", "TIBC", "IRON", "RETICCTPCT" in the last 72 hours. Most Recent Urinalysis On File:     Component Value Date/Time   COLORURINE AMBER (A) 11/22/2022 1941   APPEARANCEUR HAZY (A) 11/22/2022 1941   LABSPEC 1.027 11/22/2022 1941   PHURINE 5.0 11/22/2022 1941   GLUCOSEU NEGATIVE 11/22/2022 1941   HGBUR NEGATIVE 11/22/2022 1941   BILIRUBINUR NEGATIVE 11/22/2022 1941   KETONESUR NEGATIVE 11/22/2022 1941   PROTEINUR NEGATIVE 11/22/2022 1941   NITRITE NEGATIVE 11/22/2022 1941   LEUKOCYTESUR NEGATIVE 11/22/2022 1941   Sepsis Labs: @LABRCNTIP (procalcitonin:4,lacticidven:4) Microbiology: Recent Results (from the past 240 hour(s))  Culture, blood (Routine X 2) w Reflex to ID Panel     Status: None   Collection Time: 11/29/22  8:39 PM   Specimen: BLOOD RIGHT ARM  Result Value Ref Range Status   Specimen Description BLOOD RIGHT ARM  Final   Special Requests   Final    BOTTLES DRAWN AEROBIC AND ANAEROBIC Blood Culture results may not be optimal due to an excessive volume of blood received in culture bottles   Culture   Final    NO GROWTH 5 DAYS Performed at [facility], [address] [place], [place] [address]    Report Status 12/04/2022 FINAL  Final  Culture, blood (Routine X 2) w Reflex to ID Panel     Status: None   Collection Time: 11/29/22  8:46 PM   Specimen: BLOOD RIGHT HAND  Result Value Ref Range  Status   Specimen Description BLOOD RIGHT HAND  Final   Special Requests   Final    BOTTLES DRAWN AEROBIC AND ANAEROBIC Blood Culture adequate volume   Culture   Final    NO GROWTH 5 DAYS Performed at [facility], [address] [place], [place] [address]    Report Status 12/04/2022 FINAL  Final      Radiology Studies last 3 days: MR BRAIN WO CONTRAST  Result Date: 12/04/2022 CLINICAL DATA:  Initial evaluation for delirium. EXAM: MRI HEAD WITHOUT CONTRAST TECHNIQUE: Multiplanar, multiecho pulse sequences of the brain and surrounding structures were obtained without intravenous contrast. COMPARISON:  Prior CT from 11/30/2022. FINDINGS: Brain: Mildly advanced cerebral atrophy for age. No focal parenchymal signal abnormality or significant cerebral white matter disease. No abnormal foci of restricted diffusion to suggest acute or subacute ischemia. Gray-white matter differentiation maintained. No areas of chronic cortical infarction. No acute or chronic intracranial blood products. No mass lesion, midline shift or mass effect. Ventricles normal size without hydrocephalus. Focal septation versus adhesion noted at the frontal horn of the left lateral ventricle. No extra-axial fluid collection. Pituitary gland and suprasellar region within normal limits. 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 Vascular: Major intracranial vascular flow voids are maintained. 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 Skull and upper cervical spine: Craniocervical junction within normal limits. Bone marrow signal intensity within normal limits. No scalp soft tissue abnormality. Sinuses/Orbits: Globes and orbital soft tissues within normal limits. Trace layering secretions noted within the right maxillary sinus. Paranasal sinuses are otherwise largely clear. Trace left mastoid effusion noted, of doubtful significance. Other: None. IMPRESSION: 1. No acute intracranial  abnormality. 2. Mildly advanced cerebral atrophy for age. Electronically Signed   By: Rise Mu M.D.   On: 12/04/2022 21:52   EEG adult  Result Date: 12/04/2022 Charlsie Quest, MD  12/04/2022  4:41 PM Patient Name: Luke Rogers. MRN: [id number] Epilepsy Attending: Charlsie Quest Referring Physician/Provider: Milon Dikes, MD Date: 12/04/2022 Duration: 27.10 mins Patient history: 62yo M with ams getting eeg to evaluate for seizure Level of alertness: Awake, asleep AEDs during EEG study: Ativan Technical aspects: This EEG study was done with scalp electrodes positioned according to the 10-20 International system of electrode placement. Electrical activity was reviewed with band pass filter of 1-70Hz , sensitivity of 7 uV/mm, display speed of 36mm/sec with a 60Hz  notched filter applied as appropriate. EEG data were recorded continuously and digitally stored.  Video monitoring was available and reviewed as appropriate. Description: The posterior dominant rhythm consists of 8-9 Hz activity of moderate voltage (25-35 uV) seen predominantly in posterior head regions, symmetric and reactive to eye opening and eye closing. Sleep was characterized by vertex waves, sleep spindles (12 to 14 Hz), maximal frontocentral region. Hyperventilation and photic stimulation were not performed.   IMPRESSION: This study is within normal limits. No seizures or epileptiform discharges were seen throughout the recording. A normal interictal EEG does not exclude the diagnosis of epilepsy. Priyanka Annabelle Harman             LOS: 14 days    Time spent: 50 min    Sunnie Nielsen, DO [facility] 12/07/2022, 12:05 PM    Dictation software may have been used to generate the above note. Typos may occur and escape review in typed/dictated notes. Please contact Dr Lyn Hollingshead directly for clarity if needed.  Staff may message me via secure chat in Epic  but this may not receive an immediate  response,  please page me for urgent matters!  If 7PM-7AM, please contact night coverage [email or web address]

## 2022-12-07 NOTE — H&P (View-Only) (Signed)
  GI Inpatient Consult Note  Reason for Consult: PEG Tube Consult    Attending Requesting Consult: Dr. Natalie Anderson, DO  History of Present Illness: Luke L Dalesandro Jr. is a 61 y.o. male seen for evaluation of PEG tube consult at the request of hospitalist - Dr. Natalie Anderson. Patient has a PMH of GERD with hiatal hernia, hx of chronic HCV, hx of polysubstance abuse (cocaine, amphetamines), tobacco abuse, and EtOH abuse admitted to [facility] 5/2 two weeks ago for chief complaint of bloating found to have acute on chronic pancreatitis with new diagnosis of hepatic cirrhosis with large volume ascites, acute metabolic encephalopathy 2/2 alcohol withdrawal, and sepsis 2/2 CAP. He pulled out his NG tube yesterday. GI is consulted for consideration of PEG tube placement.   Patient seen and examined this morning resting comfortably in bed. No acute events overnight. His fiance, Cheryl, is present in room with patient. He is able to tell me his name, date of birth, and name of his fiance at bedside. He is unable to tell me his current location, day of week, or current President. He is unable to hold conversations. It has been suspected he might have Wernicke's encephalopathy and is on high dose thiamine replacement. He is currently NPO as he is severe aspiration risk. Fiance reports they learned about diagnosis of cirrhosis last year during hospitalization for alcoholic pancreatitis. He has continued to drink EtOH on a daily basis, anywhere from 80-120 ounces of beer. Palliative care is following patient. There is family meeting planned for 2 PM this afternoon.    Past Medical History:  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   Pancreatitis     Problem List: Patient Active Problem List   Diagnosis Date Noted   Protein-calorie  malnutrition, severe 12/06/2022   Alcoholic pancreatitis 11/23/2022   Non-recurrent bilateral inguinal hernia without obstruction or gangrene 09/19/2022   Recurrent acute pancreatitis 07/17/2022   Pancreatic mass 07/17/2022   Left renal mass 07/17/2022   Hypomagnesemia 12/22/2021   GERD (gastroesophageal reflux disease) 12/22/2021   Visual hallucination    Accidental drug overdose    Hyponatremia 12/18/2021   Metabolic acidosis 12/18/2021   Hyperbilirubinemia 12/18/2021   Visual hallucinations 12/18/2021   Amphetamine intoxication (HCC) 12/18/2021   Amphetamine adverse reaction 12/18/2021   Alcohol abuse    Elevated LFTs    Thrombocytopenia (HCC)    Tobacco abuse    Acute alcoholic pancreatitis 01/17/2021   Nicotine dependence 01/17/2021   Transaminitis 01/17/2021   Malnutrition of moderate degree 01/09/2018   Acute purulent meningitis 01/08/2018   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 (Patient not taking: Reported on 11/23/2022) 30   tablet 0 Not Taking   Home medication reconciliation was completed with the patient.   Scheduled Inpatient Medications:    enoxaparin (LOVENOX) injection  40 mg Subcutaneous Q24H   nicotine  21 mg Transdermal Daily   mouth rinse  15 mL Mouth Rinse 4 times per day   pantoprazole (PROTONIX) IV  40 mg Intravenous Q24H   pneumococcal 20-valent conjugate vaccine   0.5 mL Intramuscular Tomorrow-1000   spironolactone  12.5 mg Per Tube BID    Continuous Inpatient Infusions:    dextrose 5% lactated ringers 75 mL/hr at 12/07/22 0318   fluconazole (DIFLUCAN) IV 100 mg (12/06/22 1359)    PRN Inpatient Medications:  acetaminophen **OR** acetaminophen, dextrose, lactulose, LORazepam, morphine injection, [DISCONTINUED] ondansetron **OR** ondansetron (ZOFRAN) IV, mouth rinse, mouth rinse, senna-docusate  Family History: family history includes Hypertension in his mother.  The patient's family history is negative for inflammatory bowel disorders, GI malignancy, or solid organ transplantation.  Social History:   reports that he has been smoking cigarettes. He has been smoking an average of .5 packs per day. He has been exposed to tobacco smoke. He has never used smokeless tobacco. He reports current alcohol use of about 63.0 standard drinks of alcohol per week. He reports current drug use. Drug: Amphetamines. The patient denies ETOH, tobacco, or drug use.   Review of Systems:  Unable to obtain 2/2 patient's baseline encephalopathy    Physical Examination: BP 116/77 (BP Location: Right Arm)   Pulse 98   Temp 99.1 F (37.3 C)   Resp 20   Ht 6' 2" (1.88 m)   Wt 65 kg   SpO2 95%   BMI 18.40 kg/m  Gen: NAD, alert and oriented to person  HEENT: PEERLA, EOMI, Neck: supple, no JVD or thyromegaly Chest: CTA bilaterally, no wheezes, crackles, or other adventitious sounds CV: RRR, no m/g/c/r Abd: soft, NT, ND, +BS in all four quadrants; no HSM, guarding, ridigity, or rebound tenderness, no fluid wave  Ext: no edema, well perfused with 2+ pulses, Skin: no rash or lesions noted Lymph: no LAD  Data: Lab Results  Component Value Date   WBC 8.8 12/06/2022   HGB 11.6 (L) 12/06/2022   HCT 34.1 (L) 12/06/2022   MCV 94.7 12/06/2022   PLT 339 12/06/2022   Recent Labs  Lab 12/04/22 0433 12/05/22 0438 12/06/22 0438  HGB 11.4* 11.9* 11.6*   Lab Results   Component Value Date   NA 138 12/06/2022   K 3.6 12/06/2022   CL 107 12/06/2022   CO2 23 12/06/2022   BUN 16 12/06/2022   CREATININE 0.52 (L) 12/06/2022   Lab Results  Component Value Date   ALT 30 12/05/2022   AST 70 (H) 12/05/2022   ALKPHOS 103 12/05/2022   BILITOT 0.8 12/05/2022   No results for input(s): "APTT", "INR", "PTT" in the last 168 hours.  Assessment/Plan:  61 y/o Caucasian male with a PMH of GERD with hiatal hernia, hx of chronic HCV, hx of polysubstance abuse (cocaine, amphetamines), tobacco abuse, and EtOH abuse admitted to [facility] 5/2 two weeks ago for chief complaint of bloating found to have acute on chronic pancreatitis with new diagnosis of hepatic cirrhosis with large volume ascites, acute metabolic encephalopathy 2/2 alcohol withdrawal, and sepsis 2/2 CAP. GI consulted in context of consideration of PEG tube.   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 Palliative care following.  - Discussed PEG tube procedure, indications, and potential risks with fiance in room today. PEG will not help the patient recover from his illness and will not change his overall prognosis, it will just provide artificial means of nutrition. It is also known that hx of cirrhosis and ascites increases risks and may make PEG more technically challenging with greater chances of complications.  - Prognosis is guarded - Continue supportive care primary team - PEG can be placed tomorrow with Dr. Toledo if agreed upon  I reviewed the risks  (including bleeding, perforation, infection, anesthesia complications, cardiac/respiratory complications), benefits and alternatives of PEG tube placement.  ADDENDUM 5/16 1700 - After family meeting today patient and family have agreed for PEG tube - Plan for PEG Tube Placement tomorrow in endoscopy suite with Dr. Toledo   Thank you for the consult. Please call with questions or concerns.  Aleesia Henney M Nayara Taplin, PA-C [facility] [phone]  

## 2022-12-08 ENCOUNTER — Encounter
Admission: EM | Disposition: A | Discharge status: Home / Self Care | Payer: Self-pay | Source: Home / Self Care | Attending: Internal Medicine

## 2022-12-08 ENCOUNTER — Inpatient Hospital Stay: Payer: Medicaid Other | Admitting: Certified Registered Nurse Anesthetist

## 2022-12-08 ENCOUNTER — Encounter: Payer: Self-pay | Admitting: Family Medicine

## 2022-12-08 DIAGNOSIS — K852 Alcohol induced acute pancreatitis without necrosis or infection: Secondary | ICD-10-CM | POA: Diagnosis not present

## 2022-12-08 DIAGNOSIS — E440 Moderate protein-calorie malnutrition: Secondary | ICD-10-CM | POA: Diagnosis not present

## 2022-12-08 DIAGNOSIS — F1010 Alcohol abuse, uncomplicated: Secondary | ICD-10-CM | POA: Diagnosis not present

## 2022-12-08 DIAGNOSIS — Z720 Tobacco use: Secondary | ICD-10-CM | POA: Diagnosis not present

## 2022-12-08 HISTORY — PX: PEG PLACEMENT: SHX5437

## 2022-12-08 LAB — CBC
HCT: 35.1 % — ABNORMAL LOW (ref 39.0–52.0)
Hemoglobin: 12.1 g/dL — ABNORMAL LOW (ref 13.0–17.0)
MCH: 32.2 pg (ref 26.0–34.0)
MCHC: 34.5 g/dL (ref 30.0–36.0)
MCV: 93.4 fL (ref 80.0–100.0)
Platelets: 348 10*3/uL (ref 150–400)
RBC: 3.76 MIL/uL — ABNORMAL LOW (ref 4.22–5.81)
RDW: 13.3 % (ref 11.5–15.5)
WBC: 8.2 10*3/uL (ref 4.0–10.5)
nRBC: 0 % (ref 0.0–0.2)

## 2022-12-08 LAB — COMPREHENSIVE METABOLIC PANEL
ALT: 26 U/L (ref 0–44)
AST: 58 U/L — ABNORMAL HIGH (ref 15–41)
Albumin: 2.3 g/dL — ABNORMAL LOW (ref 3.5–5.0)
Alkaline Phosphatase: 100 U/L (ref 38–126)
Anion gap: 7 (ref 5–15)
BUN: 15 mg/dL (ref 6–20)
CO2: 23 mmol/L (ref 22–32)
Calcium: 8.2 mg/dL — ABNORMAL LOW (ref 8.9–10.3)
Chloride: 107 mmol/L (ref 98–111)
Creatinine, Ser: 0.57 mg/dL — ABNORMAL LOW (ref 0.61–1.24)
GFR, Estimated: >60 mL/min (ref >60)
Glucose, Bld: 96 mg/dL (ref 70–99)
Potassium: 3.6 mmol/L (ref 3.5–5.1)
Sodium: 137 mmol/L (ref 135–145)
Total Bilirubin: 0.9 mg/dL (ref 0.3–1.2)
Total Protein: 7.7 g/dL (ref 6.5–8.1)

## 2022-12-08 LAB — GLUCOSE, CAPILLARY
Glucose-Capillary: 111 mg/dL — ABNORMAL HIGH (ref 70–99)
Glucose-Capillary: 115 mg/dL — ABNORMAL HIGH (ref 70–99)
Glucose-Capillary: 85 mg/dL (ref 70–99)
Glucose-Capillary: 91 mg/dL (ref 70–99)
Glucose-Capillary: 95 mg/dL (ref 70–99)
Glucose-Capillary: 99 mg/dL (ref 70–99)

## 2022-12-08 LAB — MAGNESIUM: Magnesium: 1.7 mg/dL (ref 1.7–2.4)

## 2022-12-08 LAB — PHOSPHORUS: Phosphorus: 3.9 mg/dL (ref 2.5–4.6)

## 2022-12-08 SURGERY — INSERTION, PEG TUBE
Anesthesia: General

## 2022-12-08 MED ORDER — TRIPLE ANTIBIOTIC 3.5-400-5000 EX OINT
1.0000 | TOPICAL_OINTMENT | Freq: Every day | CUTANEOUS | Status: AC
  Administered 2022-12-08 – 2022-12-14 (×7): 1 via TOPICAL
  Filled 2022-12-08 (×7): qty 1

## 2022-12-08 MED ORDER — OSMOLITE 1.5 CAL PO LIQD
1000.0000 mL | ORAL | Status: DC
  Administered 2022-12-08 – 2022-12-14 (×6): 1000 mL

## 2022-12-08 MED ORDER — PROPOFOL 10 MG/ML IV BOLUS
INTRAVENOUS | Status: DC | PRN
  Administered 2022-12-08 (×2): 20 mg via INTRAVENOUS

## 2022-12-08 MED ORDER — THIAMINE MONONITRATE 100 MG PO TABS
100.0000 mg | ORAL_TABLET | Freq: Every day | ORAL | Status: DC
  Administered 2022-12-08 – 2022-12-19 (×12): 100 mg
  Filled 2022-12-08 (×12): qty 1

## 2022-12-08 MED ORDER — VITAL HIGH PROTEIN PO LIQD: 1000.0000 mL | ORAL | Status: DC

## 2022-12-08 MED ORDER — SODIUM CHLORIDE 0.9 % IV SOLN: INTRAVENOUS | Status: DC

## 2022-12-08 MED ORDER — PHENYLEPHRINE HCL (PRESSORS) 10 MG/ML IV SOLN
INTRAVENOUS | Status: DC | PRN
  Administered 2022-12-08: 80 ug via INTRAVENOUS

## 2022-12-08 MED ORDER — ADULT MULTIVITAMIN W/MINERALS CH
1.0000 | ORAL_TABLET | Freq: Every day | ORAL | Status: DC
  Administered 2022-12-08 – 2022-12-19 (×12): 1
  Filled 2022-12-08 (×12): qty 1

## 2022-12-08 MED ORDER — CEFAZOLIN SODIUM-DEXTROSE 1-4 GM/50ML-% IV SOLN
1.0000 g | Freq: Once | INTRAVENOUS | Status: AC
  Administered 2022-12-08: 1 g via INTRAVENOUS
  Filled 2022-12-08: qty 50

## 2022-12-08 MED ORDER — PROSOURCE TF20 ENFIT COMPATIBL EN LIQD
60.0000 mL | Freq: Every day | ENTERAL | Status: DC
  Administered 2022-12-09 – 2022-12-15 (×7): 60 mL
  Filled 2022-12-08 (×7): qty 60

## 2022-12-08 MED ORDER — PROPOFOL 500 MG/50ML IV EMUL
INTRAVENOUS | Status: DC | PRN
  Administered 2022-12-08: 150 ug/kg/min via INTRAVENOUS

## 2022-12-08 MED ORDER — DEXMEDETOMIDINE HCL IN NACL 80 MCG/20ML IV SOLN
INTRAVENOUS | Status: DC | PRN
  Administered 2022-12-08: 12 ug via INTRAVENOUS

## 2022-12-08 MED ORDER — FREE WATER
140.0000 mL | Status: DC
  Administered 2022-12-08 – 2022-12-15 (×42): 140 mL

## 2022-12-08 MED ORDER — FOLIC ACID 1 MG PO TABS
1.0000 mg | ORAL_TABLET | Freq: Every day | ORAL | Status: DC
  Administered 2022-12-08 – 2022-12-19 (×12): 1 mg
  Filled 2022-12-08 (×12): qty 1

## 2022-12-08 NOTE — Anesthesia Preprocedure Evaluation (Signed)
Anesthesia Evaluation  Patient identified by MRN, date of birth, ID band Patient awake    Reviewed: Allergy & Precautions, H&P , NPO status , Patient's Chart, lab work & pertinent test results, reviewed documented beta blocker date and time   History of Anesthesia Complications Negative for: history of anesthetic complications  Airway Mallampati: I  TM Distance: >3 FB Neck ROM: full    Dental  (+) Dental Advidsory Given, Missing, Poor Dentition   Pulmonary neg shortness of breath, neg sleep apnea, neg COPD, neg recent URI, Current Smoker and Patient abstained from smoking.   Pulmonary exam normal breath sounds clear to auscultation       Cardiovascular Exercise Tolerance: Good negative cardio ROS Normal cardiovascular exam Rhythm:regular Rate:Normal     Neuro/Psych  PSYCHIATRIC DISORDERS      negative neurological ROS     GI/Hepatic hiatal hernia,GERD  ,,(+) Cirrhosis       , Hepatitis -, C  Endo/Other  negative endocrine ROS    Renal/GU negative Renal ROS  negative genitourinary   Musculoskeletal   Abdominal   Peds  Hematology negative hematology ROS (+)   Anesthesia Other Findings Past Medical History: No date: Alcohol abuse No date: GERD (gastroesophageal reflux disease) No date: Hepatitis C No date: History of hiatal hernia No date: History of substance use     Comment:  a.) cocaine + amphetamines + ETOH No date: Pancreatitis   Reproductive/Obstetrics negative OB ROS                             Anesthesia Physical Anesthesia Plan  ASA: 3  Anesthesia Plan: General   Post-op Pain Management:    Induction: Intravenous  PONV Risk Score and Plan: 1 and Propofol infusion and TIVA  Airway Management Planned:   Additional Equipment:   Intra-op Plan:   Post-operative Plan:   Informed Consent: I have reviewed the patients History and Physical, chart, labs and discussed  the procedure including the risks, benefits and alternatives for the proposed anesthesia with the patient or authorized representative who has indicated his/her understanding and acceptance.     Dental Advisory Given  Plan Discussed with: Anesthesiologist, CRNA and Surgeon  Anesthesia Plan Comments:        Anesthesia Quick Evaluation

## 2022-12-08 NOTE — TOC Progression Note (Signed)
Transition of Care (TOC) - Progression Note    Patient Details  Name: Luke Rogers. MRN: [id number] Date of Birth: 01-Mar-1961  Transition of [facility]) CM/SW Contact  Allena Katz, LCSW Phone Number: 12/08/2022, 3:08 PM  Clinical Narrative:   Pt having PEG placed today.          Expected Discharge Plan and Services                                               Social Determinants of Health (SDOH) Interventions SDOH Screenings   Food Insecurity: No Food Insecurity (11/23/2022)  Housing: Low Risk  (11/23/2022)  Transportation Needs: No Transportation Needs (11/23/2022)  Utilities: Not At Risk (11/23/2022)  Tobacco Use: High Risk (12/08/2022)    Readmission Risk Interventions     No data to display

## 2022-12-08 NOTE — Anesthesia Procedure Notes (Signed)
Date/Time: 12/08/2022 12:55 PM  Performed by: Ginger Carne, CRNAPre-anesthesia Checklist: Patient identified, Emergency Drugs available, Suction available, Patient being monitored and Timeout performed Patient Re-evaluated:Patient Re-evaluated prior to induction Oxygen Delivery Method: Nasal cannula Preoxygenation: Pre-oxygenation with 100% oxygen Induction Type: IV induction

## 2022-12-08 NOTE — OR Nursing (Signed)
S/P EGD WITH PEG PLACEMENT. 20G PEG INSERTED BY DR TOLEDO PER PROTOCOL. TOLERATED WELL. NEOSPORIN APPLIED TO PEG SITE ,FOLLOWED BY DRAIN SPONGES AND COMBINE DRESSING. DRAINAGE BLOODY NOTED. TUBE CLAMPED.

## 2022-12-08 NOTE — Interval H&P Note (Signed)
History and Physical Interval Note:  12/08/2022 12:43 PM  Luke Rogers.  has presented today for surgery, with the diagnosis of PEG tube placement, dysphagia.  The various methods of treatment have been discussed with the patient and family. After consideration of risks, benefits and other options for treatment, the patient has consented to  Procedure(s): PERCUTANEOUS ENDOSCOPIC GASTROSTOMY (PEG) PLACEMENT (N/A) as a surgical intervention.  The patient's history has been reviewed, patient examined, no change in status, stable for surgery.  I have reviewed the patient's chart and labs.  Questions were answered to the patient's satisfaction.     [place], [place]

## 2022-12-08 NOTE — Progress Notes (Signed)
PROGRESS NOTE    Luke Rogers.   ZOX:[id number] DOB: 08-02-1960  DOA: 11/23/2022 Date of Service: 12/08/22 PCP: Patient, No Pcp Per     Brief Narrative / Hospital Course:  Maleko Pate. is a 62 y.o. male with medical history significant of alcohol abuse, GERD, hepatitis C, history of substance abuse, recurrent pancreatitis. To ED 11/22/22 w/ abd pain and bloating similar to previous pancreatitis episodes (last one was 05/2022). Typical EtOH use 3 tallboys beer per day, recently cut down to 2. (+)intermittent tremor.  05/02: admitted to hospitalist service for treatment pancreatitis, lipase nearly 1200. Following RUQ [place]. CIWA protocol, NPO and IV fluids, protonix. Pt is motivated to quit alcohol and treat withdrawals.  05/03: lipase down to 223. CIWA 1 this morning (+)tremor but improving later today. Paracentesis removed 4L. No apparent SBP.  05/04: CIWA up, valium requirement havs increased. Starting lasix + spiro. Minimal UOP, more confused/agitated, placed Foley. Transferred to stepdown for closer monitoring on phenobarbital.  05/05: remains in SDU on phenobarbital taper protocol.  05/06: Lipase trending down. Repeat paracentesis ordered.  05/07: quite agitated, requiring ativan + phenobarb but CIWA scores better on this. Paracentesis repeated removing 2L --> IR's portal HTN clinic referral made  05/08: lethargic, not taking po, NG placed.  05/09: CXR right interstitial lung opacities, concern for CAP/aspiration, requiring 5L O2 [place], continuing ceftriaxone and azithro  05/10: more alert, SLP still unable to work w/ him  05/11: SLP still unable to complete eval. Significant oral secretions, holding tube feeds 05/12: restart tube feeds half usual rate 05/13: SLP- severe aspiration risk, continue NPO. Neurology consulted - thiamine repletion, concern for Wernicke's. MRI brain and EEG normal. Palliative consulted.  05/14: per neurology, anticipate will improve but this may be  slow and may need prolonged nursing care.  05/15: still lethargic/confused. Overnight coughed up mass after he pulled NG tube, sent for pathology, NG unable to be replaced. D/w GI and GenSurg re: possible PEG vs G/J tube placement - GI will see pt, will go from there. Plan family meeting tomorrow  05/16: d/w neuro - may expect weeks-mos for improvement in cognitive fxn, whatever his situation at 3 mos from now is likely going to be new baseline. Plan for family meeting today. He is more alert but still very confused.  12/08/22 (Fri): PEG tube placed. PT/OT as able this weekend, anticipate placement conversations on Monday   Consultants:  none  Procedures: 11/24/22 US Paracentesis --> 4L 11/28/22 US Paracentesis --> 2L  11/29/22: NG tube placed  12/08/22: PEG tube placed      ASSESSMENT & PLAN:   Principal Problem:   Alcoholic pancreatitis Active Problems:   Alcohol abuse   Tobacco abuse   GERD (gastroesophageal reflux disease)   Malnutrition of moderate degree   Acute alcoholic pancreatitis   Nicotine dependence   Alcohol withdrawal Delirium Tremens Acute metabolic encephalopathy secondary to alcohol withdrawal with Delirium Tremens as well as possible Wernicke's encephalopathy,  completed benzodiazepine + phenobarbital taper in stepdown unit Neurology following, suspect may recover but this could be prolonged process.  Thiamine  Aspiration precaution  PEG placement   Alcoholic pancreatitis with lipase nearly 1200 on admission  Fail npo trial, poor intake, NG was placed but he has pulled this   Continue IV Fluids until PEG placement   Alcoholic cirrhosis w/ ascites S/p multiple Paracentesis  Low PMN, no apparent SBP MELD 12 = 6% 19-month mortality  Child-Pugh Child Class B = Indication for transplant  evaluation Will need GI follow up outpatient  furosemide and spironolactone EtOH abstinence  Repeat paracentesis as needed  F/u portal HTN clinic w/ IR outpatient    Expectoration of questionable lung/gastric mass vs clot 05/15 overnight  See photo  Coughed this up after pulling NG tube Sent for pathology   Encephalopathy related dysphagia  Has pulled NG tube and this was unable to be re-placed Aspiration precautions  D/w GI - PEG can be placed tomorrow with Dr. Norma Fredrickson if agreed upon   Acute hypoxic respiratory failure secondary to community-acquired pneumonia  Sepsis likely secondary to community-acquired pneumonia Present on admission ceftriaxone and azithromycin O2 supplementation as needed  Delirium D/t EtOH withdrawal Ammonia very slightly above WNL, do not think hepatic encephalopathy Given constipation as well, will give lactulose for this Consider scheduled lactulose, will trend ammonia   Pancreatic pseudocyst Repeat imaging if pain worsens   Hypocalcemia Check Ionized Ca and Mg   Hypomagnesemia  Repleting  Monitor  Alcohol abuse/dependence counseled on importance of cessation CIWA protocol Continue to monitor  GERD (gastroesophageal reflux disease) Protonix   Tobacco abuse Patient is not ready to quit Nicotine patch Counseled on potential complications of smoking extensively, counseled on importance of cessation  Constipation Bowel regimen including scheduled miralax x2, senna-docusate x2, enema prn, lactulose prn      DVT prophylaxis: lovenox  Pertinent IV fluids/nutrition: unable to tolerate po to get sufficient nutrition, PEG placement today  Central lines / invasive devices: none  Code Status: FULL CODE ACP documentation reviewed: 05/02 none on file in [facility]   Current Admission Status: inpatient  TOC needs / Dispo plan: pending clinical improvement, may need SNF/STR vs long term care  Barriers to discharge / significant pending items: feeding tube placement, post-hospital care placement likely to be needed              Subjective / Brief ROS:  Patient alert today but disoriented, speech is  a bit clearer but he is still disoriented.    Family Communication: spoke w/ SO, Elnita Maxwell, at bedside.     Objective Findings:  Vitals:   12/08/22 0505 12/08/22 0730 12/08/22 1133 12/08/22 1242  BP: 127/73 108/71 114/80 114/70  Pulse: 88 87 91 87  Resp: 19 16 16 18   Temp: 99.3 F (37.4 C) 98.3 F (36.8 C) 99 F (37.2 C) (!) 97 F (36.1 C)  TempSrc:  Oral Oral Temporal  SpO2: 97% 95% 96% 100%  Weight:    65.9 kg  Height:    6\' 2"  (1.88 m)    Intake/Output Summary (Last 24 hours) at 12/08/2022 1346 Last data filed at 12/08/2022 1323 Gross per 24 hour  Intake 200 ml  Output 802 ml  Net -602 ml   Filed Weights   12/06/22 0500 12/08/22 0500 12/08/22 1242  Weight: 65 kg 65.9 kg 65.9 kg    Examination:  Physical Exam Constitutional:      General: He is not in acute distress.    Appearance: He is not ill-appearing.  Cardiovascular:     Rate and Rhythm: Normal rate and regular rhythm.  Pulmonary:     Breath sounds: Normal breath sounds.  Abdominal:     General: Bowel sounds are normal.     Palpations: Abdomen is soft.     Tenderness: There is no abdominal tenderness.  Skin:    General: Skin is warm and dry.  Neurological:     Mental Status: He is alert. He is disoriented.  Psychiatric:  Mood and Affect: Mood is not anxious.          Scheduled Medications:   [MAR Hold] enoxaparin (LOVENOX) injection  40 mg Subcutaneous Q24H   [MAR Hold] nicotine  21 mg Transdermal Daily   [MAR Hold] mouth rinse  15 mL Mouth Rinse 4 times per day   [MAR Hold] pantoprazole (PROTONIX) IV  40 mg Intravenous Q24H   [MAR Hold] pneumococcal 20-valent conjugate vaccine  0.5 mL Intramuscular Tomorrow-1000   [MAR Hold] spironolactone  12.5 mg Per Tube BID    Continuous Infusions:  sodium chloride     sodium chloride 20 mL/hr at 12/08/22 1253   sodium chloride 20 mL/hr at 12/08/22 1245   dextrose 5% lactated ringers Stopped (12/08/22 1206)   [MAR Hold] fluconazole (DIFLUCAN)  IV 100 mg (12/07/22 1528)    PRN Medications:  [MAR Hold] acetaminophen **OR** [MAR Hold] acetaminophen, [MAR Hold] dextrose, [MAR Hold] lactulose, [MAR Hold] LORazepam, [MAR Hold]  morphine injection, [DISCONTINUED] ondansetron **OR** [MAR Hold] ondansetron (ZOFRAN) IV, [MAR Hold] mouth rinse, [MAR Hold] mouth rinse, [MAR Hold] senna-docusate  Antimicrobials from admission:  Anti-infectives (From admission, onward)    Start     Dose/Rate Route Frequency Ordered Stop   12/08/22 1245  [MAR Hold]  ceFAZolin (ANCEF) IVPB 1 g/50 mL premix        (MAR Hold since Fri 12/08/2022 at 1237.Hold Reason: Transfer to a Procedural area)   1 g 100 mL/hr over 30 Minutes Intravenous  Once 12/08/22 1154 12/08/22 1317   12/04/22 1530  [MAR Hold]  fluconazole (DIFLUCAN) IVPB 100 mg        (MAR Hold since Fri 12/08/2022 at 1237.Hold Reason: Transfer to a Procedural area)   100 mg 50 mL/hr over 60 Minutes Intravenous Every 24 hours 12/04/22 1322 12/09/22 1529   11/30/22 1000  azithromycin (ZITHROMAX) 500 mg in sodium chloride 0.9 % 250 mL IVPB        500 mg 250 mL/hr over 60 Minutes Intravenous Every 24 hours 11/30/22 0827 12/04/22 1038   11/30/22 0930  cefTRIAXone (ROCEPHIN) 2 g in sodium chloride 0.9 % 100 mL IVPB        2 g 200 mL/hr over 30 Minutes Intravenous Every 24 hours 11/30/22 0827 12/04/22 4098           Data Reviewed:  I have personally reviewed the following...  CBC: Recent Labs  Lab 12/02/22 0419 12/04/22 0433 12/05/22 0438 12/06/22 0438 12/08/22 0800  WBC 8.2 8.8 8.6 8.8 8.2  NEUTROABS 5.5 6.1 5.8 5.8  --   HGB 11.9* 11.4* 11.9* 11.6* 12.1*  HCT 35.7* 34.3* 35.7* 34.1* 35.1*  MCV 97.8 97.7 97.0 94.7 93.4  PLT 260 255 311 339 348   Basic Metabolic Panel: Recent Labs  Lab 12/02/22 0419 12/03/22 0428 12/04/22 0433 12/05/22 0438 12/06/22 0438 12/08/22 0800  NA 140 138 138 139 138 137  K 3.9 3.9 3.9 3.6 3.6 3.6  CL 111 107 105 108 107 107  CO2 25 25 26 26 23 23    GLUCOSE 128* 97 107* 112* 102* 96  BUN 19 21* 22* 21* 16 15  CREATININE 0.46* 0.40* 0.42* 0.46* 0.52* 0.57*  CALCIUM 7.8* 8.0* 8.0* 8.4* 8.3* 8.2*  MG 1.8 1.8  --   --   --   --   PHOS 2.9 3.9  --   --   --   --    GFR: Estimated Creatinine Clearance: 92.7 mL/min (A) (by C-G formula based on SCr of  0.57 mg/dL (L)). Liver Function Tests: Recent Labs  Lab 12/03/22 0428 12/04/22 0433 12/05/22 0438 12/08/22 0800  AST 72* 58* 70* 58*  ALT 28 26 30 26   ALKPHOS 80 90 103 100  BILITOT 1.0 0.8 0.8 0.9  PROT 7.4 7.2 7.9 7.7  ALBUMIN 2.2* 2.1* 2.2* 2.3*   No results for input(s): "LIPASE", "AMYLASE" in the last 168 hours. Recent Labs  Lab 12/04/22 1348  AMMONIA 56*   Coagulation Profile: Recent Labs  Lab 12/07/22 1806  INR 1.5*   Cardiac Enzymes: No results for input(s): "CKTOTAL", "CKMB", "CKMBINDEX", "TROPONINI" in the last 168 hours. BNP (last 3 results) No results for input(s): "PROBNP" in the last 8760 hours. HbA1C: No results for input(s): "HGBA1C" in the last 72 hours. CBG: Recent Labs  Lab 12/07/22 1946 12/07/22 2350 12/08/22 0518 12/08/22 0742 12/08/22 1154  GLUCAP 102* 102* 85 91 99   Lipid Profile: No results for input(s): "CHOL", "HDL", "LDLCALC", "TRIG", "CHOLHDL", "LDLDIRECT" in the last 72 hours. Thyroid Function Tests: No results for input(s): "TSH", "T4TOTAL", "FREET4", "T3FREE", "THYROIDAB" in the last 72 hours. Anemia Panel: No results for input(s): "VITAMINB12", "FOLATE", "FERRITIN", "TIBC", "IRON", "RETICCTPCT" in the last 72 hours. Most Recent Urinalysis On File:     Component Value Date/Time   COLORURINE AMBER (A) 11/22/2022 1941   APPEARANCEUR HAZY (A) 11/22/2022 1941   LABSPEC 1.027 11/22/2022 1941   PHURINE 5.0 11/22/2022 1941   GLUCOSEU NEGATIVE 11/22/2022 1941   HGBUR NEGATIVE 11/22/2022 1941   BILIRUBINUR NEGATIVE 11/22/2022 1941   KETONESUR NEGATIVE 11/22/2022 1941   PROTEINUR NEGATIVE 11/22/2022 1941   NITRITE NEGATIVE  11/22/2022 1941   LEUKOCYTESUR NEGATIVE 11/22/2022 1941   Sepsis Labs: @LABRCNTIP (procalcitonin:4,lacticidven:4) Microbiology: Recent Results (from the past 240 hour(s))  Culture, blood (Routine X 2) w Reflex to ID Panel     Status: None   Collection Time: 11/29/22  8:39 PM   Specimen: BLOOD RIGHT ARM  Result Value Ref Range Status   Specimen Description BLOOD RIGHT ARM  Final   Special Requests   Final    BOTTLES DRAWN AEROBIC AND ANAEROBIC Blood Culture results may not be optimal due to an excessive volume of blood received in culture bottles   Culture   Final    NO GROWTH 5 DAYS Performed at [facility], [address] [place], [place] [address]    Report Status 12/04/2022 FINAL  Final  Culture, blood (Routine X 2) w Reflex to ID Panel     Status: None   Collection Time: 11/29/22  8:46 PM   Specimen: BLOOD RIGHT HAND  Result Value Ref Range Status   Specimen Description BLOOD RIGHT HAND  Final   Special Requests   Final    BOTTLES DRAWN AEROBIC AND ANAEROBIC Blood Culture adequate volume   Culture   Final    NO GROWTH 5 DAYS Performed at [facility], [address], [place], [place] [address]    Report Status 12/04/2022 FINAL  Final      Radiology Studies last 3 days: MR BRAIN WO CONTRAST  Result Date: 12/04/2022 CLINICAL DATA:  Initial evaluation for delirium. EXAM: MRI HEAD WITHOUT CONTRAST TECHNIQUE: Multiplanar, multiecho pulse sequences of the brain and surrounding structures were obtained without intravenous contrast. COMPARISON:  Prior CT from 11/30/2022. FINDINGS: Brain: Mildly advanced cerebral atrophy for age. No focal parenchymal signal abnormality or significant cerebral white matter disease. No abnormal foci of restricted diffusion to suggest acute or subacute ischemia. Gray-white matter differentiation maintained. No  areas of chronic cortical infarction. No acute or chronic intracranial blood products. No mass lesion, midline shift or mass  effect. Ventricles normal size without hydrocephalus. Focal septation versus adhesion noted at the frontal horn of the left lateral ventricle. No extra-axial fluid collection. Pituitary gland and suprasellar region within normal limits. 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 Vascular: Major intracranial vascular flow voids are maintained. 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 Skull and upper cervical spine: Craniocervical junction within normal limits. Bone marrow signal intensity within normal limits. No scalp soft tissue abnormality. Sinuses/Orbits: Globes and orbital soft tissues within normal limits. Trace layering secretions noted within the right maxillary sinus. Paranasal sinuses are otherwise largely clear. Trace left mastoid effusion noted, of doubtful significance. Other: None. IMPRESSION: 1. No acute intracranial abnormality. 2. Mildly advanced cerebral atrophy for age. Electronically Signed   By: Rise Mu M.D.   On: 12/04/2022 21:52   EEG adult  Result Date: 12/04/2022 Charlsie Quest, MD     12/04/2022  4:41 PM Patient Name: Luke Rogers. MRN: [id number] Epilepsy Attending: Charlsie Quest Referring Physician/Provider: Milon Dikes, MD Date: 12/04/2022 Duration: 27.10 mins Patient history: 62yo M with ams getting eeg to evaluate for seizure Level of alertness: Awake, asleep AEDs during EEG study: Ativan Technical aspects: This EEG study was done with scalp electrodes positioned according to the 10-20 International system of electrode placement. Electrical activity was reviewed with band pass filter of 1-70Hz , sensitivity of 7 uV/mm, display speed of 3mm/sec with a 60Hz  notched filter applied as appropriate. EEG data were recorded continuously and digitally stored.  Video monitoring was available and reviewed as appropriate. Description: The posterior dominant rhythm consists of 8-9 Hz activity of moderate voltage (25-35 uV)  seen predominantly in posterior head regions, symmetric and reactive to eye opening and eye closing. Sleep was characterized by vertex waves, sleep spindles (12 to 14 Hz), maximal frontocentral region. Hyperventilation and photic stimulation were not performed.   IMPRESSION: This study is within normal limits. No seizures or epileptiform discharges were seen throughout the recording. A normal interictal EEG does not exclude the diagnosis of epilepsy. Priyanka Annabelle Harman             LOS: 15 days    Time spent: 50 min    Sunnie Nielsen, DO [facility] 12/08/2022, 1:46 PM    Dictation software may have been used to generate the above note. Typos may occur and escape review in typed/dictated notes. Please contact Dr Lyn Hollingshead directly for clarity if needed.  Staff may message me via secure chat in Epic  but this may not receive an immediate response,  please page me for urgent matters!  If 7PM-7AM, please contact night coverage [email or web address]

## 2022-12-08 NOTE — Progress Notes (Signed)
[facility]enterology Inpatient Progress Note    Subjective: Patient seen for post op visit. EGD with PEG tube placement earlier this afternoon. Patient still confused, arousable. NAD  Objective: Vital signs in last 24 hours: Temp:  [97 F (36.1 C)-99.3 F (37.4 C)] 99 F (37.2 C) (05/17 1438) Pulse Rate:  [78-91] 82 (05/17 1438) Resp:  [12-29] 16 (05/17 1438) BP: (90-127)/(57-84) 110/75 (05/17 1438) SpO2:  [95 %-100 %] 95 % (05/17 1438) Weight:  [65.9 kg] 65.9 kg (05/17 1242) Blood pressure 110/75, pulse 82, temperature 99 F (37.2 C), resp. rate 16, height 6\' 2"  (1.88 m), weight 65.9 kg, SpO2 95 %.    Intake/Output from previous day: 05/16 0701 - 05/17 0700 In: -  Out: 800 [Urine:800]  Intake/Output this shift: Total I/O In: 200 [I.V.:200] Out: 2 [Blood:2]   Gen: NAD. Appears comfortable.  HEENT: [place]/AT. PERRLA. Normal external ear exam.  Chest: CTA, no wheezes.  CV: RR nl S1, S2. No gallops.  Abd: soft, nt, nd. BS+. PEG site clear.  Ext: no edema. Pulses 2+  Neuro: Somnolent, confiused.   Lab Results: Results for orders placed or performed during the hospital encounter of 11/23/22 (from the past 24 hour(s))  Glucose, capillary     Status: Abnormal   Collection Time: 12/07/22  4:05 PM  Result Value Ref Range   Glucose-Capillary 100 (H) 70 - 99 mg/dL  Protime-INR     Status: Abnormal   Collection Time: 12/07/22  6:06 PM  Result Value Ref Range   Prothrombin Time 18.6 (H) 11.4 - 15.2 seconds   INR 1.5 (H) 0.8 - 1.2  Glucose, capillary     Status: Abnormal   Collection Time: 12/07/22  7:46 PM  Result Value Ref Range   Glucose-Capillary 102 (H) 70 - 99 mg/dL  Glucose, capillary     Status: Abnormal   Collection Time: 12/07/22 11:50 PM  Result Value Ref Range   Glucose-Capillary 102 (H) 70 - 99 mg/dL  Glucose, capillary     Status: None   Collection Time: 12/08/22  5:18 AM  Result Value Ref Range   Glucose-Capillary 85 70 - 99 mg/dL   Glucose, capillary     Status: None   Collection Time: 12/08/22  7:42 AM  Result Value Ref Range   Glucose-Capillary 91 70 - 99 mg/dL  Comprehensive metabolic panel     Status: Abnormal   Collection Time: 12/08/22  8:00 AM  Result Value Ref Range   Sodium 137 135 - 145 mmol/L   Potassium 3.6 3.5 - 5.1 mmol/L   Chloride 107 98 - 111 mmol/L   CO2 23 22 - 32 mmol/L   Glucose, Bld 96 70 - 99 mg/dL   BUN 15 6 - 20 mg/dL   Creatinine, Ser 1.61 (L) 0.61 - 1.24 mg/dL   Calcium 8.2 (L) 8.9 - 10.3 mg/dL   Total Protein 7.7 6.5 - 8.1 g/dL   Albumin 2.3 (L) 3.5 - 5.0 g/dL   AST 58 (H) 15 - 41 U/L   ALT 26 0 - 44 U/L   Alkaline Phosphatase 100 38 - 126 U/L   Total Bilirubin 0.9 0.3 - 1.2 mg/dL   GFR, Estimated >09 >60 mL/min   Anion gap 7 5 - 15  CBC     Status: Abnormal   Collection Time: 12/08/22  8:00 AM  Result Value Ref Range   WBC 8.2 4.0 - 10.5 K/uL   RBC 3.76 (L) 4.22 - 5.81 MIL/uL   Hemoglobin  12.1 (L) 13.0 - 17.0 g/dL   HCT 16.1 (L) 09.6 - 04.5 %   MCV 93.4 80.0 - 100.0 fL   MCH 32.2 26.0 - 34.0 pg   MCHC 34.5 30.0 - 36.0 g/dL   RDW 40.9 81.1 - 91.4 %   Platelets 348 150 - 400 K/uL   nRBC 0.0 0.0 - 0.2 %  Glucose, capillary     Status: None   Collection Time: 12/08/22 11:54 AM  Result Value Ref Range   Glucose-Capillary 99 70 - 99 mg/dL     Recent Labs    78/29/56 0438 12/08/22 0800  WBC 8.8 8.2  HGB 11.6* 12.1*  HCT 34.1* 35.1*  PLT 339 348   BMET Recent Labs    12/06/22 0438 12/08/22 0800  NA 138 137  K 3.6 3.6  CL 107 107  CO2 23 23  GLUCOSE 102* 96  BUN 16 15  CREATININE 0.52* 0.57*  CALCIUM 8.3* 8.2*   LFT Recent Labs    12/08/22 0800  PROT 7.7  ALBUMIN 2.3*  AST 58*  ALT 26  ALKPHOS 100  BILITOT 0.9   PT/INR Recent Labs    12/07/22 1806  LABPROT 18.6*  INR 1.5*   Hepatitis Panel No results for input(s): "HEPBSAG", "HCVAB", "HEPAIGM", "HEPBIGM" in the last 72 hours. C-Diff No results for input(s): "CDIFFTOX" in the last 72  hours. No results for input(s): "CDIFFPCR" in the last 72 hours.   Studies/Results: No results found.  Scheduled Inpatient Medications:    [MAR Hold] enoxaparin (LOVENOX) injection  40 mg Subcutaneous Q24H   [MAR Hold] nicotine  21 mg Transdermal Daily   [MAR Hold] mouth rinse  15 mL Mouth Rinse 4 times per day   [MAR Hold] pantoprazole (PROTONIX) IV  40 mg Intravenous Q24H   [MAR Hold] pneumococcal 20-valent conjugate vaccine  0.5 mL Intramuscular Tomorrow-1000   [MAR Hold] spironolactone  12.5 mg Per Tube BID    Continuous Inpatient Infusions:    sodium chloride     sodium chloride 20 mL/hr at 12/08/22 1253   sodium chloride 20 mL/hr at 12/08/22 1245   dextrose 5% lactated ringers Stopped (12/08/22 1206)   [MAR Hold] fluconazole (DIFLUCAN) IV 100 mg (12/08/22 1534)    PRN Inpatient Medications:  [MAR Hold] acetaminophen **OR** [MAR Hold] acetaminophen, [MAR Hold] dextrose, [MAR Hold] lactulose, [MAR Hold] LORazepam, [MAR Hold]  morphine injection, [DISCONTINUED] ondansetron **OR** [MAR Hold] ondansetron (ZOFRAN) IV, [MAR Hold] mouth rinse, [MAR Hold] mouth rinse, [MAR Hold] senna-docusate  Miscellaneous: N/A  Assessment:  Wernicke's encephalopathy. Feeding problem. S/P EGD with PEG tube placement  Plan:  May begin using tube for meds, flushes, nutrition. Obtain Dietician consult for tube feeding recommendations. Call us back if we can help. Dr. Wyline Mood is covering the weekend until 5/20 if any issues. Thank you  Patsey Pitstick K. Norma Fredrickson, M.D. 12/08/2022, 3:52 PM

## 2022-12-08 NOTE — Op Note (Addendum)
[facility] Gastroenterology Patient Name: Luke Rogers Procedure Date: 12/08/2022 12:05 PM MRN: [id number] Account #: [id number] Date of Birth: Oct 20, 1960 Admit Type: Inpatient Age: 62 Room: [facility] ENDO ROOM 1 Gender: Male Note Status: Finalized Instrument Name: Upper Endoscope [id number] Procedure:             Upper GI endoscopy Indications:           Place PEG Providers:             Boykin Nearing. Norma Fredrickson MD, MD Referring MD:          No Local Md, MD (Referring MD) Medicines:             Propofol per Anesthesia Complications:         No immediate complications. Procedure:             Pre-Anesthesia Assessment:                        - The risks and benefits of the procedure and the                         sedation options and risks were discussed with the                         patient. All questions were answered and informed                         consent was obtained.                        - Patient identification and proposed procedure were                         verified prior to the procedure by the nurse. The                         procedure was verified in the procedure room.                        - ASA Grade Assessment: III - A patient with severe                         systemic disease.                        - After reviewing the risks and benefits, the patient                         was deemed in satisfactory condition to undergo the                         procedure.                        After obtaining informed consent, the endoscope was                         passed under direct vision. Throughout the procedure,  the patient's blood pressure, pulse, and oxygen                         saturations were monitored continuously. The Endoscope                         was introduced through the mouth, and advanced to the                         third part of duodenum. The upper GI endoscopy was                          somewhat difficult due to the patient's agitation.                         Successful completion of the procedure was aided by                         increasing the dose of sedation medication. The                         patient tolerated the procedure well. Findings:      The esophagus was normal.      The entire examined stomach was normal.      Placement of an endoscopically removable PEG with 4 T-fasteners was       successfully completed. The external bumper was at the 4.0 cm marking on       the tube. Estimated blood loss was minimal.      The in the duodenum was normal. Impression:            - Normal esophagus.                        - Normal stomach.                        - Normal.                        - An endoscopically removable PEG placement was                         successfully completed.                        - No specimens collected. Recommendation:        - Patient has a contact number available for                         emergencies. The signs and symptoms of potential                         delayed complications were discussed with the patient.                         Return to normal activities tomorrow. Written                         discharge instructions were provided to the patient.                        -  see post peg orders.                        Don't use tube for 4 hours. Procedure Code(s):     --- Professional ---                        [id number], Esophagogastroduodenoscopy, flexible,                         transoral; with directed placement of percutaneous                         gastrostomy tube Diagnosis Code(s):     --- Professional ---                        Z43.1, Encounter for attention to gastrostomy CPT copyright 2022 American [employer] Association. All rights reserved. The codes documented in this report are preliminary and upon coder review may  be revised to meet current compliance requirements. Stanton Kidney MD, MD 12/08/2022  1:25:49 PM This report has been signed electronically. Number of Addenda: 0 Note Initiated On: 12/08/2022 12:05 PM Estimated Blood Loss:  Estimated blood loss: none. Estimated blood loss was                         minimal.      [facility]

## 2022-12-08 NOTE — Progress Notes (Signed)
Nutrition Follow-up  DOCUMENTATION CODES:   Severe malnutrition in context of acute illness/injury  INTERVENTION:   -TF via PEG:   Initiate Osmolite 1.5 @ 20 ml/hr and increase by 10 ml every 4 hours to goal rate of 65 ml/hr.   60 ml Prosource TF daily   140 ml free water flush every 4 hours  Tube feeding regimen provides 2420 kcal (100% of needs), 118 grams of protein, and 1189 ml of H2O. Total free water: 2029 ml daily  -MVI, thiamine and folic acid daily via tube    -Daily weights   NUTRITION DIAGNOSIS:   Severe Malnutrition related to acute illness as evidenced by severe muscle depletion, severe fat depletion.  Ongoing  GOAL:   Patient will meet greater than or equal to 90% of their needs  Progressing   MONITOR:   Diet advancement, Labs, Weight trends, TF tolerance, Skin, I & O's  REASON FOR ASSESSMENT:   Consult Enteral/tube feeding initiation and management  ASSESSMENT:   62 y/o male with h/o etoh abuse, substance abuse, hepatitis C, GERD, hiatal hernia and cirrhosis who is admitted with acutre pancreatitis and pancreatic pseudocyst.  -Pt s/p paracentesis 5/3 & 5/7 - Pt s/p NGT placement 5/8 (now removed) 5/17- PEG placed  Reviewed I/O's: -800 ml x 24 hours and -2.3 L since 11/24/22  UOP: 800 ml x 24 hours   NGT removed by pt on 12/06/22.   Pt remains altered and NPO. Pt underwent PEG placement today per GI. Per GI, okay to start TF via tube.   Medications reviewed and include difulcan, aldactone, and protonix.   Labs reviewed: CBGS: 99.   Diet Order:   Diet Order             Diet NPO time specified  Diet effective now                   EDUCATION NEEDS:   No education needs have been identified at this time  Skin:  Skin Assessment: Skin Integrity Issues: Skin Integrity Issues:: Incisions Incisions: lt lower abdomen  Last BM:  12/07/22 (type 6)  Height:   Ht Readings from Last 1 Encounters:  12/08/22 6\' 2"  (1.88 m)     Weight:   Wt Readings from Last 1 Encounters:  12/08/22 65.9 kg    Ideal Body Weight:  86.4 kg  BMI:  Body mass index is 18.65 kg/m.  Estimated Nutritional Needs:   Kcal:  2200-2500kcal/day  Protein:  110-125g/day  Fluid:  > 2 L    Levada Schilling, RD, LDN, CDCES Registered Dietitian II Certified Diabetes Care and Education Specialist Please refer to [facility] for RD and/or RD on-call/weekend/after hours pager

## 2022-12-08 NOTE — Transfer of Care (Signed)
Immediate Anesthesia Transfer of Care Note  Patient: Luke Rogers.  Procedure(s) Performed: PERCUTANEOUS ENDOSCOPIC GASTROSTOMY (PEG) PLACEMENT  Patient Location: Endoscopy Unit  Anesthesia Type:General  Level of Consciousness: sedated  Airway & Oxygen Therapy: Patient Spontanous Breathing  Post-op Assessment: Report given to RN and Post -op Vital signs reviewed and stable  Post vital signs: Reviewed and stable  Last Vitals:  Vitals Value Taken Time  BP 90/57 12/08/22 1347  Temp    Pulse 78 12/08/22 1347  Resp 21 12/08/22 1347  SpO2 96 % 12/08/22 1347    Last Pain:  Vitals:   12/08/22 1242  TempSrc: Temporal  PainSc: 0-No pain      Patients Stated Pain Goal: 0 (11/24/22 2000)  Complications: No notable events documented.

## 2022-12-08 NOTE — Anesthesia Postprocedure Evaluation (Signed)
Anesthesia Post Note  Patient: Luke Rogers.  Procedure(s) Performed: PERCUTANEOUS ENDOSCOPIC GASTROSTOMY (PEG) PLACEMENT  Patient location during evaluation: Endoscopy Anesthesia Type: General Level of consciousness: awake and alert Pain management: pain level controlled Vital Signs Assessment: post-procedure vital signs reviewed and stable Respiratory status: spontaneous breathing, nonlabored ventilation, respiratory function stable and patient connected to nasal cannula oxygen Cardiovascular status: blood pressure returned to baseline and stable Postop Assessment: no apparent nausea or vomiting Anesthetic complications: no   No notable events documented.   Last Vitals:  Vitals:   12/08/22 1403 12/08/22 1438  BP: 103/62 110/75  Pulse: 88 82  Resp: (!) 29 16  Temp:  37.2 C  SpO2: 97% 95%    Last Pain:  Vitals:   12/08/22 1353  TempSrc:   PainSc: 0-No pain                 Lenard Simmer

## 2022-12-09 ENCOUNTER — Inpatient Hospital Stay: Payer: Medicaid Other

## 2022-12-09 DIAGNOSIS — K219 Gastro-esophageal reflux disease without esophagitis: Secondary | ICD-10-CM | POA: Diagnosis not present

## 2022-12-09 DIAGNOSIS — E43 Unspecified severe protein-calorie malnutrition: Secondary | ICD-10-CM

## 2022-12-09 DIAGNOSIS — F1010 Alcohol abuse, uncomplicated: Secondary | ICD-10-CM | POA: Diagnosis not present

## 2022-12-09 DIAGNOSIS — K852 Alcohol induced acute pancreatitis without necrosis or infection: Secondary | ICD-10-CM | POA: Diagnosis not present

## 2022-12-09 DIAGNOSIS — Z720 Tobacco use: Secondary | ICD-10-CM | POA: Diagnosis not present

## 2022-12-09 LAB — COMPREHENSIVE METABOLIC PANEL
ALT: 25 U/L (ref 0–44)
AST: 59 U/L — ABNORMAL HIGH (ref 15–41)
Albumin: 2.1 g/dL — ABNORMAL LOW (ref 3.5–5.0)
Alkaline Phosphatase: 97 U/L (ref 38–126)
Anion gap: 5 (ref 5–15)
BUN: 12 mg/dL (ref 6–20)
CO2: 22 mmol/L (ref 22–32)
Calcium: 7.9 mg/dL — ABNORMAL LOW (ref 8.9–10.3)
Chloride: 106 mmol/L (ref 98–111)
Creatinine, Ser: 0.45 mg/dL — ABNORMAL LOW (ref 0.61–1.24)
GFR, Estimated: >60 mL/min (ref >60)
Glucose, Bld: 108 mg/dL — ABNORMAL HIGH (ref 70–99)
Potassium: 3.5 mmol/L (ref 3.5–5.1)
Sodium: 133 mmol/L — ABNORMAL LOW (ref 135–145)
Total Bilirubin: 1 mg/dL (ref 0.3–1.2)
Total Protein: 7.4 g/dL (ref 6.5–8.1)

## 2022-12-09 LAB — PHOSPHORUS
Phosphorus: 3 mg/dL (ref 2.5–4.6)
Phosphorus: 3.8 mg/dL (ref 2.5–4.6)

## 2022-12-09 LAB — CBC
HCT: 33.7 % — ABNORMAL LOW (ref 39.0–52.0)
Hemoglobin: 11.6 g/dL — ABNORMAL LOW (ref 13.0–17.0)
MCH: 32.3 pg (ref 26.0–34.0)
MCHC: 34.4 g/dL (ref 30.0–36.0)
MCV: 93.9 fL (ref 80.0–100.0)
Platelets: 318 10*3/uL (ref 150–400)
RBC: 3.59 MIL/uL — ABNORMAL LOW (ref 4.22–5.81)
RDW: 13.3 % (ref 11.5–15.5)
WBC: 9.3 10*3/uL (ref 4.0–10.5)
nRBC: 0 % (ref 0.0–0.2)

## 2022-12-09 LAB — HCV RNA QUANT: HCV Quantitative: NOT DETECTED IU/mL (ref >50)

## 2022-12-09 LAB — GLUCOSE, CAPILLARY
Glucose-Capillary: 119 mg/dL — ABNORMAL HIGH (ref 70–99)
Glucose-Capillary: 102 mg/dL — ABNORMAL HIGH (ref 70–99)
Glucose-Capillary: 125 mg/dL — ABNORMAL HIGH (ref 70–99)
Glucose-Capillary: 126 mg/dL — ABNORMAL HIGH (ref 70–99)
Glucose-Capillary: 97 mg/dL (ref 70–99)

## 2022-12-09 LAB — MAGNESIUM
Magnesium: 1.6 mg/dL — ABNORMAL LOW (ref 1.7–2.4)
Magnesium: 1.7 mg/dL (ref 1.7–2.4)

## 2022-12-09 LAB — AMMONIA: Ammonia: 29 umol/L (ref 9–35)

## 2022-12-09 MED ORDER — OMEPRAZOLE 2 MG/ML ORAL SUSPENSION: 40.0000 mg | Freq: Every day | ORAL | Status: DC

## 2022-12-09 MED ORDER — SENNOSIDES-DOCUSATE SODIUM 8.6-50 MG PO TABS: 2.0000 | ORAL_TABLET | Freq: Every evening | ORAL | Status: DC | PRN

## 2022-12-09 MED ORDER — OXYCODONE HCL 5 MG PO TABS
10.0000 mg | ORAL_TABLET | Freq: Four times a day (QID) | ORAL | Status: DC | PRN
  Administered 2022-12-09: 10 mg via ORAL
  Filled 2022-12-09: qty 2

## 2022-12-09 MED ORDER — LORAZEPAM 2 MG/ML IJ SOLN
1.0000 mg | INTRAMUSCULAR | Status: DC | PRN
  Administered 2022-12-09 – 2022-12-14 (×10): 1 mg via INTRAMUSCULAR
  Filled 2022-12-09 (×10): qty 1

## 2022-12-09 MED ORDER — FAMOTIDINE 20 MG PO TABS
20.0000 mg | ORAL_TABLET | Freq: Every day | ORAL | Status: DC
  Administered 2022-12-09 – 2022-12-19 (×11): 20 mg
  Filled 2022-12-09 (×11): qty 1

## 2022-12-09 MED ORDER — OXYCODONE HCL 5 MG PO TABS
10.0000 mg | ORAL_TABLET | Freq: Four times a day (QID) | ORAL | Status: DC | PRN
  Administered 2022-12-10 (×2): 10 mg
  Filled 2022-12-09 (×2): qty 2

## 2022-12-09 NOTE — Progress Notes (Signed)
Brief Nutrition Support Note  Received consult for enteral/tube feeding initiation and management. Patient had PEG placed 5/18 and started on Osmolite 1.5 at 5mL/hr yesterday evening. RD following patient, last assessment completed yesterday.   - Continue advancing towards goal TF regimen below by 10mL Q4H: Osmolite 1.5 @ 65 ml/hr.  60 ml Prosource TF daily  +140 ml free water flush every 4 hours Provides 2420 kcal (100% of needs), 118 grams of protein, and 1189 ml of H2O. Total free water: 2029 ml daily  -MVI, thiamine and folic acid daily via tube  -Daily weights    RD to follow per protocol.   Shelle Iron RD, LDN For contact information, refer to [facility].

## 2022-12-09 NOTE — Progress Notes (Signed)
OT Cancellation Note  Patient Details Name: Luke Rogers. MRN: [id number] DOB: Nov 03, 1960   Cancelled Treatment:    Reason Eval/Treat Not Completed: Other (comment). Consult received, RN requesting therapy hold until this afternoon due to agitation this morning.   Arman Filter., MPH, MS, OTR/L ascom [phone] 12/09/22, 10:42 AM

## 2022-12-09 NOTE — Progress Notes (Signed)
PROGRESS NOTE    [name].   LKG:[id number] DOB: [birth year]  DOA: 11/23/2022 Date of Service: 12/09/22 PCP: Patient, No Pcp Per     Brief Narrative / Hospital Course:  Luke Rogers. is a 62 y.o. male with medical history significant of alcohol abuse, GERD, hepatitis C, history of substance abuse, recurrent pancreatitis. To ED 11/22/22 w/ abd pain and bloating similar to previous pancreatitis episodes (last one was 05/2022). Typical EtOH use 3 tallboys beer per day, recently cut down to 2. (+)intermittent tremor.  05/02: admitted to hospitalist service for treatment pancreatitis, lipase nearly 1200. Following RUQ [place]. CIWA protocol, NPO and IV fluids, protonix. Pt is motivated to quit alcohol and treat withdrawals.  05/03: lipase down to 223. CIWA 1 this morning (+)tremor but improving later today. Paracentesis removed 4L. No apparent SBP.  05/04: CIWA up, valium requirement havs increased. Starting lasix + spiro. Minimal UOP, more confused/agitated, placed Foley. Transferred to stepdown for closer monitoring on phenobarbital.  05/05: remains in SDU on phenobarbital taper protocol.  05/06: Lipase trending down. Repeat paracentesis ordered.  05/07: quite agitated, requiring ativan + phenobarb but CIWA scores better on this. Paracentesis repeated removing 2L --> IR's portal HTN clinic referral made  05/08: lethargic, not taking po, NG placed.  05/09: CXR right interstitial lung opacities, concern for CAP/aspiration, requiring 5L O2 [place], continuing ceftriaxone and azithro  05/10: more alert, SLP still unable to work w/ him  05/11: SLP still unable to complete eval. Significant oral secretions, holding tube feeds 05/12: restart tube feeds half usual rate 05/13: SLP- severe aspiration risk, continue NPO. Neurology consulted - thiamine repletion, concern for Wernicke's. MRI brain and EEG normal. Palliative consulted.  05/14: per neurology, anticipate will improve but this may be  slow and may need prolonged nursing care.  05/15: still lethargic/confused. Overnight coughed up mass after he pulled NG tube, sent for pathology, NG unable to be replaced. D/w GI and GenSurg re: possible PEG vs G/J tube placement - GI will see pt, will go from there. Plan family meeting tomorrow  05/16: d/w neuro - may expect weeks-mos for improvement in cognitive fxn, whatever his situation at 3 mos from now is likely going to be new baseline. Plan for family meeting today. He is more alert but still very confused.  12/08/22 (Fri): PEG tube placed. PT/OT as able this weekend, anticipate placement conversations on Monday  05/18: confused but abd is distended, tender. CT abd/pelv ordered to evaluate given PEG placed yesterday   Consultants:  none  Procedures: 11/24/22 US Paracentesis --> 4L 11/28/22 US Paracentesis --> 2L  11/29/22: NG tube placed  12/08/22: PEG tube placed      ASSESSMENT & PLAN:   Principal Problem:   Alcoholic pancreatitis Active Problems:   Alcohol abuse   Tobacco abuse   GERD (gastroesophageal reflux disease)   Malnutrition of moderate degree   Acute alcoholic pancreatitis   Nicotine dependence   Alcohol withdrawal Delirium Tremens Acute metabolic encephalopathy secondary to alcohol withdrawal with Delirium Tremens as well as possible Wernicke's encephalopathy,  completed benzodiazepine + phenobarbital taper in stepdown unit Neurology following, suspect may recover but this could be prolonged process.  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 Norma Fredrickson if agreed upon   Acute hypoxic respiratory failure secondary to community-acquired pneumonia  Sepsis likely secondary to community-acquired pneumonia Present on admission ceftriaxone and azithromycin O2 supplementation as needed  Delirium D/t EtOH withdrawal Ammonia very slightly above WNL, do not think hepatic encephalopathy Given constipation as well, will give lactulose for this Consider scheduled lactulose, will trend ammonia   Pancreatic pseudocyst Repeat imaging if pain worsens   Hypocalcemia Check Ionized Ca and Mg   Hypomagnesemia  Repleting  Monitor  Alcohol abuse/dependence counseled on importance of cessation CIWA protocol Continue to monitor  GERD (gastroesophageal reflux disease) Protonix   Tobacco abuse Patient is not ready to quit Nicotine patch Counseled on potential complications of smoking extensively, counseled on importance of cessation  Constipation Bowel regimen including scheduled miralax x2, senna-docusate x2, enema prn, lactulose prn      DVT prophylaxis: lovenox  Pertinent IV fluids/nutrition: unable to tolerate po to get sufficient nutrition, PEG placement today  Central lines / invasive devices: none  Code Status: FULL CODE ACP documentation reviewed: 05/02 none on file in [facility]   Current Admission Status: inpatient  TOC needs / Dispo plan: pending clinical improvement, may need SNF/STR  vs long term care  Barriers to discharge / significant pending items: feeding tube placement, post-hospital care placement likely to be needed              Subjective / Brief ROS:  Patient alert today but disoriented, appears uncomfortable, grimacing but cannot describe    Family Communication: none at this time will update later today     Objective Findings:  Vitals:   12/09/22 0423 12/09/22 0500 12/09/22 0816 12/09/22 1157  BP: 131/77  123/83 100/69  Pulse: 93  91 87  Resp: 16  18 19   Temp: 99 F (37.2 C)  98.3 F (36.8 C) 98.5 F (36.9 C)  TempSrc:      SpO2: 96%  97% 97%  Weight:  64.9 kg    Height:        Intake/Output Summary (Last 24 hours) at 12/09/2022 1209 Last data filed at 12/09/2022 0500 Gross per 24 hour  Intake 2315 ml  Output 902 ml  Net 1413 ml   Filed Weights   12/08/22 0500 12/08/22 1242 12/09/22 0500  Weight: 65.9 kg 65.9 kg 64.9 kg    Examination:  Physical Exam Constitutional:      General: He is not in acute distress.    Appearance: He is not ill-appearing.  Cardiovascular:     Rate and Rhythm: Normal rate and regular rhythm.  Pulmonary:     Breath sounds: Normal breath sounds.  Abdominal:     General: Bowel sounds are normal.     Tenderness: There is generalized abdominal tenderness. There is guarding.  Skin:    General: Skin is warm  and dry.  Neurological:     Mental Status: He is alert. He is disoriented.  Psychiatric:        Mood and Affect: Mood is not anxious.          Scheduled Medications:   enoxaparin (LOVENOX) injection  40 mg Subcutaneous Q24H   feeding supplement (PROSource TF20)  60 mL Per Tube Daily   folic acid  1 mg Per Tube Daily   free water  140 mL Per Tube Q4H   multivitamin with minerals  1 tablet Per Tube Daily   neomycin-bacitracin-polymyxin  1 Application Topical Daily   nicotine  21 mg Transdermal Daily   mouth rinse  15 mL Mouth Rinse 4 times per day   pantoprazole (PROTONIX) IV  40 mg  Intravenous Q24H   pneumococcal 20-valent conjugate vaccine  0.5 mL Intramuscular Tomorrow-1000   spironolactone  12.5 mg Per Tube BID   thiamine  100 mg Per Tube Daily    Continuous Infusions:  dextrose 5% lactated ringers 75 mL/hr at 12/08/22 1715   feeding supplement (OSMOLITE 1.5 CAL) 65 mL/hr at 12/09/22 1001    PRN Medications:  acetaminophen **OR** acetaminophen, dextrose, lactulose, LORazepam, morphine injection, [DISCONTINUED] ondansetron **OR** ondansetron (ZOFRAN) IV, mouth rinse, mouth rinse, senna-docusate  Antimicrobials from admission:  Anti-infectives (From admission, onward)    Start     Dose/Rate Route Frequency Ordered Stop   12/08/22 1245  ceFAZolin (ANCEF) IVPB 1 g/50 mL premix        1 g 100 mL/hr over 30 Minutes Intravenous  Once 12/08/22 1154 12/08/22 1532   12/04/22 1530  fluconazole (DIFLUCAN) IVPB 100 mg        100 mg 50 mL/hr over 60 Minutes Intravenous Every 24 hours 12/04/22 1322 12/08/22 1715   11/30/22 1000  azithromycin (ZITHROMAX) 500 mg in sodium chloride 0.9 % 250 mL IVPB        500 mg 250 mL/hr over 60 Minutes Intravenous Every 24 hours 11/30/22 0827 12/04/22 1038   11/30/22 0930  cefTRIAXone (ROCEPHIN) 2 g in sodium chloride 0.9 % 100 mL IVPB        2 g 200 mL/hr over 30 Minutes Intravenous Every 24 hours 11/30/22 0827 12/04/22 6962           Data Reviewed:  I have personally reviewed the following...  CBC: Recent Labs  Lab 12/04/22 0433 12/05/22 0438 12/06/22 0438 12/08/22 0800 12/09/22 0455  WBC 8.8 8.6 8.8 8.2 9.3  NEUTROABS 6.1 5.8 5.8  --   --   HGB 11.4* 11.9* 11.6* 12.1* 11.6*  HCT 34.3* 35.7* 34.1* 35.1* 33.7*  MCV 97.7 97.0 94.7 93.4 93.9  PLT 255 311 339 348 318   Basic Metabolic Panel: Recent Labs  Lab 12/03/22 0428 12/04/22 0433 12/05/22 0438 12/06/22 0438 12/08/22 0800 12/08/22 1628 12/09/22 0455  NA 138 138 139 138 137  --  133*  K 3.9 3.9 3.6 3.6 3.6  --  3.5  CL 107 105 108 107 107  --  106  CO2  25 26 26 23 23   --  22  GLUCOSE 97 107* 112* 102* 96  --  108*  BUN 21* 22* 21* 16 15  --  12  CREATININE 0.40* 0.42* 0.46* 0.52* 0.57*  --  0.45*  CALCIUM 8.0* 8.0* 8.4* 8.3* 8.2*  --  7.9*  MG 1.8  --   --   --   --  1.7 1.6*  PHOS 3.9  --   --   --   --  3.9 3.0   GFR: Estimated Creatinine Clearance: 91.3 mL/min (A) (by C-G formula based on SCr of 0.45 mg/dL (L)). Liver Function Tests: Recent Labs  Lab 12/03/22 0428 12/04/22 0433 12/05/22 0438 12/08/22 0800 12/09/22 0455  AST 72* 58* 70* 58* 59*  ALT 28 26 30 26 25   ALKPHOS 80 90 103 100 97  BILITOT 1.0 0.8 0.8 0.9 1.0  PROT 7.4 7.2 7.9 7.7 7.4  ALBUMIN 2.2* 2.1* 2.2* 2.3* 2.1*   No results for input(s): "LIPASE", "AMYLASE" in the last 168 hours. Recent Labs  Lab 12/04/22 1348 12/09/22 0455  AMMONIA 56* 29   Coagulation Profile: Recent Labs  Lab 12/07/22 1806  INR 1.5*   Cardiac Enzymes: No results for input(s): "CKTOTAL", "CKMB", "CKMBINDEX", "TROPONINI" in the last 168 hours. BNP (last 3 results) No results for input(s): "PROBNP" in the last 8760 hours. HbA1C: No results for input(s): "HGBA1C" in the last 72 hours. CBG: Recent Labs  Lab 12/08/22 2004 12/08/22 2350 12/09/22 0417 12/09/22 0755 12/09/22 1157  GLUCAP 115* 111* 119* 102* 125*   Lipid Profile: No results for input(s): "CHOL", "HDL", "LDLCALC", "TRIG", "CHOLHDL", "LDLDIRECT" in the last 72 hours. Thyroid Function Tests: No results for input(s): "TSH", "T4TOTAL", "FREET4", "T3FREE", "THYROIDAB" in the last 72 hours. Anemia Panel: No results for input(s): "VITAMINB12", "FOLATE", "FERRITIN", "TIBC", "IRON", "RETICCTPCT" in the last 72 hours. Most Recent Urinalysis On File:     Component Value Date/Time   COLORURINE AMBER (A) 11/22/2022 1941   APPEARANCEUR HAZY (A) 11/22/2022 1941   LABSPEC 1.027 11/22/2022 1941   PHURINE 5.0 11/22/2022 1941   GLUCOSEU NEGATIVE 11/22/2022 1941   HGBUR NEGATIVE 11/22/2022 1941   BILIRUBINUR NEGATIVE  11/22/2022 1941   KETONESUR NEGATIVE 11/22/2022 1941   PROTEINUR NEGATIVE 11/22/2022 1941   NITRITE NEGATIVE 11/22/2022 1941   LEUKOCYTESUR NEGATIVE 11/22/2022 1941   Sepsis Labs: @LABRCNTIP (procalcitonin:4,lacticidven:4) Microbiology: Recent Results (from the past 240 hour(s))  Culture, blood (Routine X 2) w Reflex to ID Panel     Status: None   Collection Time: 11/29/22  8:39 PM   Specimen: BLOOD RIGHT ARM  Result Value Ref Range Status   Specimen Description BLOOD RIGHT ARM  Final   Special Requests   Final    BOTTLES DRAWN AEROBIC AND ANAEROBIC Blood Culture results may not be optimal due to an excessive volume of blood received in culture bottles   Culture   Final    NO GROWTH 5 DAYS Performed at [facility], [address] [place], [place] [address]    Report Status 12/04/2022 FINAL  Final  Culture, blood (Routine X 2) w Reflex to ID Panel     Status: None   Collection Time: 11/29/22  8:46 PM   Specimen: BLOOD RIGHT HAND  Result Value Ref Range Status   Specimen Description BLOOD RIGHT HAND  Final   Special Requests   Final    BOTTLES DRAWN AEROBIC AND ANAEROBIC Blood Culture adequate volume   Culture   Final    NO GROWTH 5 DAYS Performed at [facility], [address], [place], [place] [address]    Report Status 12/04/2022 FINAL  Final      Radiology Studies last 3 days: No results found.           LOS: 16 days    Time spent: 50 min    Sunnie Nielsen, DO [facility] 12/09/2022, 12:09 PM    Dictation software may have been used to generate the above note. Typos  may occur and escape review in typed/dictated notes. Please contact Dr Lyn Hollingshead directly for clarity if needed.  Staff may message me via secure chat in Epic  but this may not receive an immediate response,  please page me for urgent matters!  If 7PM-7AM, please contact night coverage [email or web address]

## 2022-12-09 NOTE — Evaluation (Signed)
Physical Therapy Evaluation Patient Details Name: Luke Rogers. MRN: [id number] DOB: 10-Apr-1961 Today's Date: 12/09/2022  History of Present Illness  Pt is a 62 y.o. male with medical history significant of alcohol abuse, GERD, hepatitis C, history of substance abuse, recurrent pancreatitis. To ED 11/22/22 w/ abd pain and bloating similar to previous pancreatitis episodes, has had several paracentesis this admission as well as NGT placed due to lethargy. S/p PEG tube on 12/08/22.  Clinical Impression  Pt presents somnolent in bed with fiance present to provide history. PT and OT conducted co-treatment due to need for more physical assistance and potential safety concerns. PT and OT able to wake pt after several attempts including turning on lights and sternal rub. Pt shows decreased mobility requiring assistance with sit to stand with pt demonstrating poor trunk support. Pt requiring multimodal cuing along with increased time to sequence lateral side steps to head of bed. He was motivated and capable of following one step commands with increased difficulty with multi-step commands. Pt is below baseline as reported by finance and he will benefit from skilled PT to improve his mobility and strength to return to requiring less assistance from finance for him to live at home. PT recommending skilled PT in rehab setting below pending potential for change given improvement in cognition and medical status.      Recommendations for follow up therapy are one component of a multi-disciplinary discharge planning process, led by the attending physician.  Recommendations may be updated based on patient status, additional functional criteria and insurance authorization.  Follow Up Recommendations Can patient physically be transported by private vehicle: No     Assistance Recommended at Discharge Frequent or constant Supervision/Assistance  Patient can return home with the following  A lot of help with  walking and/or transfers;A lot of help with bathing/dressing/bathroom;Assist for transportation;Assistance with cooking/housework;Assistance with feeding    Equipment Recommendations Rolling walker (2 wheels)  Recommendations for Other Services       Functional Status Assessment Patient has had a recent decline in their functional status and demonstrates the ability to make significant improvements in function in a reasonable and predictable amount of time.     Precautions / Restrictions Precautions Precautions: Fall Precaution Comments: PEG tube Restrictions Weight Bearing Restrictions: No      Mobility  Bed Mobility Overal bed mobility: Modified Independent       Supine to sit: Modified independent (Device/Increase time) Sit to supine: Min guard   General bed mobility comments: Requies assistance with guiding LEs into bed    Transfers   Equipment used: Rolling walker (2 wheels) Transfers: Sit to/from Stand Sit to Stand: Mod assist, +2 physical assistance, From elevated surface           General transfer comment: multimodal cuing for step placement    Ambulation/Gait Ambulation/Gait assistance: Mod assist, +2 physical assistance Gait Distance (Feet): 5 Feet Assistive device: Rolling walker (2 wheels) Gait Pattern/deviations:  (Lateral side step to [facility])       General Gait Details: Lateral side steps to [facility] with multimodal cuing  Stairs            Wheelchair Mobility    Modified Rankin (Stroke Patients Only)       Balance Overall balance assessment: Needs assistance Sitting-balance support:  (Intermittent trunk support) Sitting balance-Leahy Scale: Poor   Postural control: Other (comment) (Anterior lean)   Standing balance-Leahy Scale: Poor  Pertinent Vitals/Pain Pain Assessment Faces Pain Scale: Hurts a little bit Pain Location: Not clear    Home Living Family/patient expects to be discharged  to:: Private residence Living Arrangements: Spouse/significant other;Parent Available Help at Discharge: Family Type of Home: House Home Access: Level entry     Alternate Level Stairs-Number of Steps: Addition is in basement with level entrance available Home Layout: Two level Home Equipment: Wheelchair - manual      Prior Function Prior Level of Function : Independent/Modified Independent             Mobility Comments: indep at baseline ADLs Comments: indep at baseline     Hand Dominance   Dominant Hand: Right    Extremity/Trunk Assessment   Upper Extremity Assessment Upper Extremity Assessment: Defer to OT evaluation    Lower Extremity Assessment Lower Extremity Assessment: Generalized weakness    Cervical / Trunk Assessment Cervical / Trunk Assessment: Normal  Communication   Communication: Expressive difficulties;Other (comment) (Speech is slurred and hard to understand)  Cognition Arousal/Alertness: Awake/alert, Lethargic Behavior During Therapy: WFL for tasks assessed/performed Overall Cognitive Status: History of cognitive impairments - at baseline Area of Impairment: Memory                 Orientation Level: Time, Disoriented to     Following Commands: Follows one step commands consistently, Follows multi-step commands inconsistently     Problem Solving: Requires verbal cues, Requires tactile cues, Difficulty sequencing          General Comments General comments (skin integrity, edema, etc.): PEG tube in place on hold for session, RN restarted at end of session, HOB>30*    Exercises     Assessment/Plan    PT Assessment Patient needs continued PT services  PT Problem List Decreased strength;Decreased knowledge of use of DME;Decreased balance;Decreased mobility;Decreased cognition       PT Treatment Interventions DME instruction;Gait training;Functional mobility training;Therapeutic activities;Therapeutic exercise;Balance training     PT Goals (Current goals can be found in the Care Plan section)  Acute Rehab PT Goals Patient Stated Goal: To return home safely PT Goal Formulation: With patient/family Time For Goal Achievement: 12/23/22 Potential to Achieve Goals: Good    Frequency Min 4X/week     Co-evaluation PT/OT/SLP Co-Evaluation/Treatment: Yes Reason for Co-Treatment: For patient/therapist safety;Necessary to address cognition/behavior during functional activity;Complexity of the patient's impairments (multi-system involvement) PT goals addressed during session: Mobility/safety with mobility;Balance;Proper use of DME OT goals addressed during session: ADL's and self-care;Proper use of Adaptive equipment and DME       AM-PAC PT "6 Clicks" Mobility  Outcome Measure Help needed turning from your back to your side while in a flat bed without using bedrails?: A Little Help needed moving from lying on your back to sitting on the side of a flat bed without using bedrails?: A Little Help needed moving to and from a bed to a chair (including a wheelchair)?: A Lot Help needed standing up from a chair using your arms (e.g., wheelchair or bedside chair)?: A Lot Help needed to walk in hospital room?: Total Help needed climbing 3-5 steps with a railing? : Total 6 Click Score: 12    End of Session Equipment Utilized During Treatment: Gait belt Activity Tolerance: Patient tolerated treatment well Patient left: in bed;with call bell/phone within reach;with nursing/sitter in room;with family/visitor present;with restraints reapplied Nurse Communication: Mobility status PT Visit Diagnosis: Unsteadiness on feet (R26.81);Difficulty in walking, not elsewhere classified (R26.2);Muscle weakness (generalized) (M62.81)    Time:  -  Charges:   PT Evaluation $PT Eval Moderate Complexity: 1 Mod PT Treatments $Therapeutic Activity: 8-22 mins       Ellin Goodie PT, DPT  12/09/2022, 4:40 PM

## 2022-12-09 NOTE — Evaluation (Signed)
Occupational Therapy Evaluation Patient Details Name: Luke Rogers. MRN: [id number] DOB: [birth year] Today's Date: 12/09/2022   History of Present Illness Pt is a 62 y.o. male with medical history significant of alcohol abuse, GERD, hepatitis C, history of substance abuse, recurrent pancreatitis. To ED 11/22/22 w/ abd pain and bloating similar to previous pancreatitis episodes, has had several paracentesis this admission as well as NGT placed due to lethargy. S/p PEG tube on 12/08/22.   Clinical Impression   Pt was seen for OT evaluation this date. Prior to hospital admission, pt was independent. Fiance present and pt initially very lethargic, alertness improving with time, upright positioning, and environmental and tactile stimuli. Pt lives with his fiance and his parents who he and fiance care for. Pt presents to acute OT demonstrating impaired ADL performance and functional mobility 2/2 decreased strength, balance, activity tolerance, cognition, and coordination (See OT problem list). Pt currently requires varying assist for bed mobility, MOD A +2 for STS and lateral steps EOB with RW and MAX VC for sequencing, MAX A for LB ADL, MOD A for UB ADL. Pt tolerated standing with significant assist and heavy BUE support for MAX A pericare but fatigued quickly requiring him to sit for brief rest break before standing again. Pt demonstrates good potential for progress. Pt would benefit from additional skilled OT services to address noted impairments and functional limitations (see below for any additional details) in order to maximize safety and independence while minimizing falls risk and caregiver burden.    Recommendations for follow up therapy are one component of a multi-disciplinary discharge planning process, led by the attending physician.  Recommendations may be updated based on patient status, additional functional criteria and insurance authorization.   Assistance Recommended at Discharge  Frequent or constant Supervision/Assistance  Patient can return home with the following Two people to help with walking and/or transfers;A lot of help with bathing/dressing/bathroom;Assistance with cooking/housework;Assist for transportation;Help with stairs or ramp for entrance;Assistance with feeding;Direct supervision/assist for medications management;Direct supervision/assist for financial management    Functional Status Assessment  Patient has had a recent decline in their functional status and demonstrates the ability to make significant improvements in function in a reasonable and predictable amount of time.  Equipment Recommendations  BSC/3in1    Recommendations for Other Services       Precautions / Restrictions Precautions Precautions: Fall Precaution Comments: PEG tube Restrictions Weight Bearing Restrictions: No      Mobility Bed Mobility Overal bed mobility: Needs Assistance Bed Mobility: Supine to Sit, Sit to Supine     Supine to sit: Min assist, +2 for safety/equipment Sit to supine: Max assist, +2 for physical assistance   General bed mobility comments: initial MIN A for sup>sit from PT; on 2nd attempt, supv +2 for safety for sup>sit with VC from PT for sequencing    Transfers Overall transfer level: Needs assistance Equipment used: Rolling walker (2 wheels) Transfers: Sit to/from Stand, Bed to chair/wheelchair/BSC Sit to Stand: Mod assist, +2 physical assistance, From elevated surface          Lateral/Scoot Transfers: Mod assist, +2 physical assistance General transfer comment: VC and TC for hand placement on RW, MOD A +2 to stand and for lateral shuffled steps EOB, tolerated standing ~36min for pericare      Balance Overall balance assessment: Needs assistance Sitting-balance support: Feet supported, Single extremity supported, Bilateral upper extremity supported Sitting balance-Leahy Scale: Poor Sitting balance - Comments: poor tolerance, requiring  VC/TC to improve posture to  prevent LOB   Standing balance support: During functional activity, Reliant on assistive device for balance, Bilateral upper extremity supported Standing balance-Leahy Scale: Poor Standing balance comment: assist to maintain standing balance                           ADL either performed or assessed with clinical judgement   ADL Overall ADL's : Needs assistance/impaired Eating/Feeding: NPO Eating/Feeding Details (indicate cue type and reason): PEG tube feeds   Grooming Details (indicate cue type and reason): pt declined Upper Body Bathing: Moderate assistance;Bed level Upper Body Bathing Details (indicate cue type and reason): anticipate MOD A bed level Lower Body Bathing: Maximal assistance;Bed level Lower Body Bathing Details (indicate cue type and reason): anticipate MAX A bed level Upper Body Dressing : Sitting;Moderate assistance Upper Body Dressing Details (indicate cue type and reason): anticipate MOD A sitting EOB Lower Body Dressing: Bed level;Maximal assistance Lower Body Dressing Details (indicate cue type and reason): anticipate MAX A bed level Toilet Transfer: +2 for physical assistance;Moderate assistance;Stand-pivot;BSC/3in1;Rolling walker (2 wheels);Maximal assistance Toilet Transfer Details (indicate cue type and reason): given transfer during session, anticipate MOD-MAX for SPT to [facility] with RW Toileting- Clothing Manipulation and Hygiene: Sit to/from stand;Maximal assistance Toileting - Clothing Manipulation Details (indicate cue type and reason): MAX A in standing for pericare             Vision         Perception     Praxis      Pertinent Vitals/Pain Pain Assessment Pain Assessment: Faces Faces Pain Scale: Hurts a little bit Pain Location: grimaces wiht initially sitting up Pain Descriptors / Indicators: Grimacing Pain Intervention(s): Monitored during session, Premedicated before session, Repositioned      Hand Dominance Right   Extremity/Trunk Assessment Upper Extremity Assessment Upper Extremity Assessment: Generalized weakness   Lower Extremity Assessment Lower Extremity Assessment: Generalized weakness       Communication     Cognition Arousal/Alertness: Lethargic Behavior During Therapy: Flat affect Overall Cognitive Status: Impaired/Different from baseline Area of Impairment: Following commands, Problem solving                       Following Commands: Follows one step commands with increased time     Problem Solving: Slow processing, Requires verbal cues       General Comments  PEG tube in place on hold for session, RN restarted at end of session, HOB>30*    Exercises     Shoulder Instructions      Home Living Family/patient expects to be discharged to:: Private residence Living Arrangements: Spouse/significant other Available Help at Discharge: Family;Available 24 hours/day Type of Home: House Home Access: Level entry     Home Layout: Two level;Able to live on main level with bedroom/bathroom Alternate Level Stairs-Number of Steps: flight of stairs where pt's parents live   Bathroom Shower/Tub: Walk-in shower         Home Equipment: Agricultural consultant (2 wheels);Cane - single point;Shower seat;Wheelchair - manual          Prior Functioning/Environment               Mobility Comments: indep at baseline ADLs Comments: indep at baseline        OT Problem List: Decreased strength;Decreased coordination;Pain;Decreased cognition;Decreased activity tolerance;Decreased safety awareness;Decreased knowledge of use of DME or AE;Impaired balance (sitting and/or standing);Impaired UE functional use      OT Treatment/Interventions: Self-care/ADL  training;Therapeutic exercise;Therapeutic activities;Cognitive remediation/compensation;Energy conservation;DME and/or AE instruction;Patient/family education;Balance training    OT Goals(Current  goals can be found in the care plan section) Acute Rehab OT Goals Patient Stated Goal: get better OT Goal Formulation: With patient/family Time For Goal Achievement: 12/23/22 Potential to Achieve Goals: Good ADL Goals Pt Will Perform Grooming: sitting;with min assist (CGA in sitting, >45min EOB without LOB) Pt Will Transfer to Toilet: with mod assist;bedside commode;stand pivot transfer (LRAD,) Pt Will Perform Toileting - Clothing Manipulation and hygiene: sitting/lateral leans;with min assist Additional ADL Goal #1: Pt will follow 2-step commands during ADL/mobility tasks, 4/4 opportunities.  OT Frequency: Min 3X/week    Co-evaluation PT/OT/SLP Co-Evaluation/Treatment: Yes Reason for Co-Treatment: For patient/therapist safety;Necessary to address cognition/behavior during functional activity;Complexity of the patient's impairments (multi-system involvement) PT goals addressed during session: Mobility/safety with mobility;Balance;Proper use of DME OT goals addressed during session: ADL's and self-care;Proper use of Adaptive equipment and DME      AM-PAC OT "6 Clicks" Daily Activity     Outcome Measure Help from another person eating meals?: Total Help from another person taking care of personal grooming?: A Little Help from another person toileting, which includes using toliet, bedpan, or urinal?: Total Help from another person bathing (including washing, rinsing, drying)?: A Lot Help from another person to put on and taking off regular upper body clothing?: A Lot Help from another person to put on and taking off regular lower body clothing?: A Lot 6 Click Score: 11   End of Session Equipment Utilized During Treatment: Gait belt;Rolling walker (2 wheels) Nurse Communication: Mobility status  Activity Tolerance: Patient tolerated treatment well Patient left: in bed;with call bell/phone within reach;with bed alarm set;with family/visitor present;with restraints reapplied;Other (comment)  (PEG tube in place, HOB>30*, RN restarted tube feeds)  OT Visit Diagnosis: Other abnormalities of gait and mobility (R26.89);Muscle weakness (generalized) (M62.81)                Time: 9604-5409 OT Time Calculation (min): 23 min Charges:  OT General Charges $OT Visit: 1 Visit OT Evaluation $OT Eval Moderate Complexity: 1 Mod  Arman Filter., MPH, MS, OTR/L ascom [phone] 12/09/22, 4:09 PM

## 2022-12-10 DIAGNOSIS — E440 Moderate protein-calorie malnutrition: Secondary | ICD-10-CM | POA: Diagnosis not present

## 2022-12-10 DIAGNOSIS — Z720 Tobacco use: Secondary | ICD-10-CM | POA: Diagnosis not present

## 2022-12-10 DIAGNOSIS — K852 Alcohol induced acute pancreatitis without necrosis or infection: Secondary | ICD-10-CM | POA: Diagnosis not present

## 2022-12-10 DIAGNOSIS — F1010 Alcohol abuse, uncomplicated: Secondary | ICD-10-CM | POA: Diagnosis not present

## 2022-12-10 LAB — CBC
HCT: 33.7 % — ABNORMAL LOW (ref 39.0–52.0)
Hemoglobin: 11.5 g/dL — ABNORMAL LOW (ref 13.0–17.0)
MCH: 31.9 pg (ref 26.0–34.0)
MCHC: 34.1 g/dL (ref 30.0–36.0)
MCV: 93.4 fL (ref 80.0–100.0)
Platelets: 313 10*3/uL (ref 150–400)
RBC: 3.61 MIL/uL — ABNORMAL LOW (ref 4.22–5.81)
RDW: 13.3 % (ref 11.5–15.5)
WBC: 10.5 10*3/uL (ref 4.0–10.5)
nRBC: 0 % (ref 0.0–0.2)

## 2022-12-10 LAB — GLUCOSE, CAPILLARY
Glucose-Capillary: 104 mg/dL — ABNORMAL HIGH (ref 70–99)
Glucose-Capillary: 107 mg/dL — ABNORMAL HIGH (ref 70–99)
Glucose-Capillary: 108 mg/dL — ABNORMAL HIGH (ref 70–99)
Glucose-Capillary: 112 mg/dL — ABNORMAL HIGH (ref 70–99)
Glucose-Capillary: 114 mg/dL — ABNORMAL HIGH (ref 70–99)
Glucose-Capillary: 135 mg/dL — ABNORMAL HIGH (ref 70–99)
Glucose-Capillary: 142 mg/dL — ABNORMAL HIGH (ref 70–99)

## 2022-12-10 LAB — BASIC METABOLIC PANEL
Anion gap: 3 — ABNORMAL LOW (ref 5–15)
BUN: 13 mg/dL (ref 6–20)
CO2: 26 mmol/L (ref 22–32)
Calcium: 7.6 mg/dL — ABNORMAL LOW (ref 8.9–10.3)
Chloride: 102 mmol/L (ref 98–111)
Creatinine, Ser: 0.49 mg/dL — ABNORMAL LOW (ref 0.61–1.24)
GFR, Estimated: >60 mL/min (ref >60)
Glucose, Bld: 120 mg/dL — ABNORMAL HIGH (ref 70–99)
Potassium: 3.8 mmol/L (ref 3.5–5.1)
Sodium: 131 mmol/L — ABNORMAL LOW (ref 135–145)

## 2022-12-10 LAB — MAGNESIUM: Magnesium: 1.7 mg/dL (ref 1.7–2.4)

## 2022-12-10 LAB — PHOSPHORUS: Phosphorus: 3 mg/dL (ref 2.5–4.6)

## 2022-12-10 MED ORDER — CALCIUM CARBONATE 1250 (500 CA) MG PO TABS
1000.0000 mg | ORAL_TABLET | Freq: Every day | ORAL | Status: DC
  Filled 2022-12-10: qty 2

## 2022-12-10 MED ORDER — FUROSEMIDE 20 MG PO TABS
20.0000 mg | ORAL_TABLET | Freq: Every day | ORAL | Status: DC
  Administered 2022-12-10 – 2022-12-19 (×10): 20 mg
  Filled 2022-12-10 (×10): qty 1

## 2022-12-10 NOTE — Progress Notes (Signed)
PROGRESS NOTE    [name].   ZOX:[id number] DOB: 1960-09-18  DOA: 11/23/2022 Date of Service: 12/10/22 PCP: Patient, No Pcp Per     Brief Narrative / Hospital Course:  Luke Bertelsen. is a 62 y.o. male with medical history significant of alcohol abuse, GERD, hepatitis C, history of substance abuse, recurrent pancreatitis. To ED 11/22/22 w/ abd pain and bloating similar to previous pancreatitis episodes (last one was 05/2022). Typical EtOH use 3 tallboys beer per day, recently cut down to 2. (+)intermittent tremor.  05/02: admitted to hospitalist service for treatment pancreatitis, lipase nearly 1200. Following RUQ [place]. CIWA protocol, NPO and IV fluids, protonix. Pt is motivated to quit alcohol and treat withdrawals.  05/03: lipase down to 223. CIWA 1 this morning (+)tremor but improving later today. Paracentesis removed 4L. No apparent SBP.  05/04: CIWA up, valium requirement havs increased. Starting lasix + spiro. Minimal UOP, more confused/agitated, placed Foley. Transferred to stepdown for closer monitoring on phenobarbital.  05/05: remains in SDU on phenobarbital taper protocol.  05/06: Lipase trending down. Repeat paracentesis ordered.  05/07: quite agitated, requiring ativan + phenobarb but CIWA scores better on this. Paracentesis repeated removing 2L --> IR's portal HTN clinic referral made  05/08: lethargic, not taking po, NG placed.  05/09: CXR right interstitial lung opacities, concern for CAP/aspiration, requiring 5L O2 [place], continuing ceftriaxone and azithro  05/10: more alert, SLP still unable to work w/ him  05/11: SLP still unable to complete eval. Significant oral secretions, holding tube feeds 05/12: restart tube feeds half usual rate 05/13: SLP- severe aspiration risk, continue NPO. Neurology consulted - thiamine repletion, concern for Wernicke's. MRI brain and EEG normal. Palliative consulted.  05/14: per neurology, anticipate will improve but this may be  slow and may need prolonged nursing care.  05/15: still lethargic/confused. Overnight coughed up mass after he pulled NG tube, sent for pathology, NG unable to be replaced. D/w GI and GenSurg re: possible PEG vs G/J tube placement - GI will see pt, will go from there. Plan family meeting tomorrow  05/16: d/w neuro - may expect weeks-mos for improvement in cognitive fxn, whatever his situation at 3 mos from now is likely going to be new baseline. Family meeting today, see IPAL note. Full code, full scope, attempt resuscitation but willing to revisit this issue and discuss DNR if he declines further. Goal for facility placement and see hoew he does over next 3 mos. 12/08/22 (Fri): PEG tube placed. PT/OT as able this weekend, anticipate placement conversations on Monday  05/18: confused but abd tender. CT abd/pelv - no acute findings or complications from recent procedure. PT/OT recs for SNF/STR.  05/19 (Sunday): stable, can start SNF/STR planning tomorrow   Consultants:  none  Procedures: 11/24/22 US Paracentesis --> 4L 11/28/22 US Paracentesis --> 2L  11/29/22: NG tube placed  12/08/22: PEG tube placed      ASSESSMENT & PLAN:   Principal Problem:   Alcoholic pancreatitis Active Problems:   Alcohol abuse   Tobacco abuse   GERD (gastroesophageal reflux disease)   Malnutrition of moderate degree   Acute alcoholic pancreatitis   Nicotine dependence   Alcohol withdrawal Delirium Tremens - resolved Acute metabolic encephalopathy secondary to alcohol withdrawal with Delirium Tremens as well as 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  Thiamine  Aspiration precaution  PEG placement done   Alcoholic cirrhosis  w/ ascites S/p multiple Paracentesis  Low PMN, no apparent SBP MELD 12 = 6% 72-month mortality  Child-Pugh Child Class B =  Indication for transplant evaluation Will need GI and portal HTN clinic w/ IR follow up outpatient  furosemide and spironolactone EtOH abstinence  Repeat paracentesis as needed   Encephalopathy related dysphagia  Has pulled NG tube and this was unable to be re-placed Aspiration precautions  PEG feeds and meds SLP to follow   Acute hypoxic respiratory failure secondary to community-acquired pneumonia - resolved Abx have been d/c O2 supplementation as needed Repeat CXR as needed  Delirium D/t EtOH withdrawal Ammonia was very slightly above WNL, do not think hepatic encephalopathy Given constipation as well, will give lactulose as needed Consider scheduled lactulose, will trend ammonia --> has been WNL  Pancreatic pseudocyst Repeat imaging no concerns   Hypocalcemia Replacing Monitor   Hypomagnesemia  Replacing  Monitor  Alcohol abuse/dependence counseled on importance of cessation when he was lucid CIWA protocol completed Continue to monitor - per fiancee he is still asking for alcohol when delirious   GERD (gastroesophageal reflux disease) Hiatal hernia  PPI   Tobacco abuse Patient is not ready to quit (asked when lucid) Nicotine patch Counseled on potential complications of smoking extensively, counseled on importance of cessation  Constipation Bowel regimen / as needed  Alcoholic pancreatitis with lipase nearly 1200 on admission  resolved  Abdominal pain Concern for complication from PEG placement, possible SBP/ascites, other. CT showed no concerns. Pain appears resolved. Monitor   Expectoration of questionable lung/gastric mass 05/15 overnight  See photo in media Coughed this up after pulling NG tube Sent for pathology --> mucoid tissue w/ focal reactive squamous epithelium, no ciliated respiratory epithelium, likely upper aerodigestive tract origin, no malignancy    DVT prophylaxis: lovenox  Pertinent IV fluids/nutrition: tube feeds  Central lines /  invasive devices: none  Code Status: FULL CODE ACP documentation reviewed: 05/02 none on file in [facility]   Current Admission Status: inpatient  TOC needs / Dispo plan: need SNF/STR  Barriers to discharge / significant pending items: placement              Subjective / Brief ROS:  Patient alert today but disoriented, appears more comfortable today and more calm   Family Communication: Elnita Maxwell is at bedside on rounds     Objective Findings:  Vitals:   12/10/22 0002 12/10/22 0443 12/10/22 0500 12/10/22 1138  BP: 107/65 103/74  (!) 93/58  Pulse: 91 92  83  Resp: 16 (!) 22  18  Temp: 99.3 F (37.4 C) 98.6 F (37 C)  98.2 F (36.8 C)  TempSrc: Oral     SpO2: 98% 98%  94%  Weight:   72.4 kg   Height:        Intake/Output Summary (Last 24 hours) at 12/10/2022 1153 Last data filed at 12/10/2022 0659 Gross per 24 hour  Intake 920 ml  Output 675 ml  Net 245 ml   Filed Weights   12/08/22 1242 12/09/22 0500 12/10/22 0500  Weight: 65.9 kg 64.9 kg 72.4 kg    Examination:  Physical Exam Constitutional:      General: He is not in acute distress.    Appearance: He is not ill-appearing.  Cardiovascular:     Rate and Rhythm: Normal rate and regular rhythm.  Pulmonary:     Breath sounds: Normal breath sounds.  Abdominal:     General: Bowel sounds are normal.     Tenderness:  There is no abdominal tenderness. There is no guarding.  Skin:    General: Skin is warm and dry.  Neurological:     Mental Status: He is alert. He is disoriented.  Psychiatric:        Mood and Affect: Mood is not anxious.          Scheduled Medications:   enoxaparin (LOVENOX) injection  40 mg Subcutaneous Q24H   famotidine  20 mg Per Tube Daily   feeding supplement (PROSource TF20)  60 mL Per Tube Daily   folic acid  1 mg Per Tube Daily   free water  140 mL Per Tube Q4H   furosemide  20 mg Per Tube Daily   multivitamin with minerals  1 tablet Per Tube Daily    neomycin-bacitracin-polymyxin  1 Application Topical Daily   nicotine  21 mg Transdermal Daily   mouth rinse  15 mL Mouth Rinse 4 times per day   pneumococcal 20-valent conjugate vaccine  0.5 mL Intramuscular Tomorrow-1000   spironolactone  12.5 mg Per Tube BID   thiamine  100 mg Per Tube Daily    Continuous Infusions:  feeding supplement (OSMOLITE 1.5 CAL) 65 mL/hr at 12/10/22 0541    PRN Medications:  acetaminophen **OR** acetaminophen, dextrose, lactulose, LORazepam, morphine injection, mouth rinse, mouth rinse, oxyCODONE, senna-docusate  Antimicrobials from admission:  Anti-infectives (From admission, onward)    Start     Dose/Rate Route Frequency Ordered Stop   12/08/22 1245  ceFAZolin (ANCEF) IVPB 1 g/50 mL premix        1 g 100 mL/hr over 30 Minutes Intravenous  Once 12/08/22 1154 12/08/22 1532   12/04/22 1530  fluconazole (DIFLUCAN) IVPB 100 mg        100 mg 50 mL/hr over 60 Minutes Intravenous Every 24 hours 12/04/22 1322 12/08/22 1715   11/30/22 1000  azithromycin (ZITHROMAX) 500 mg in sodium chloride 0.9 % 250 mL IVPB        500 mg 250 mL/hr over 60 Minutes Intravenous Every 24 hours 11/30/22 0827 12/04/22 1038   11/30/22 0930  cefTRIAXone (ROCEPHIN) 2 g in sodium chloride 0.9 % 100 mL IVPB        2 g 200 mL/hr over 30 Minutes Intravenous Every 24 hours 11/30/22 0827 12/04/22 4098           Data Reviewed:  I have personally reviewed the following...  CBC: Recent Labs  Lab 12/04/22 0433 12/05/22 0438 12/06/22 0438 12/08/22 0800 12/09/22 0455 12/10/22 0458  WBC 8.8 8.6 8.8 8.2 9.3 10.5  NEUTROABS 6.1 5.8 5.8  --   --   --   HGB 11.4* 11.9* 11.6* 12.1* 11.6* 11.5*  HCT 34.3* 35.7* 34.1* 35.1* 33.7* 33.7*  MCV 97.7 97.0 94.7 93.4 93.9 93.4  PLT 255 311 339 348 318 313   Basic Metabolic Panel: Recent Labs  Lab 12/05/22 0438 12/06/22 0438 12/08/22 0800 12/08/22 1628 12/09/22 0455 12/09/22 1649 12/10/22 0458  NA 139 138 137  --  133*  --  131*   K 3.6 3.6 3.6  --  3.5  --  3.8  CL 108 107 107  --  106  --  102  CO2 26 23 23   --  22  --  26  GLUCOSE 112* 102* 96  --  108*  --  120*  BUN 21* 16 15  --  12  --  13  CREATININE 0.46* 0.52* 0.57*  --  0.45*  --  0.49*  CALCIUM  8.4* 8.3* 8.2*  --  7.9*  --  7.6*  MG  --   --   --  1.7 1.6* 1.7 1.7  PHOS  --   --   --  3.9 3.0 3.8 3.0   GFR: Estimated Creatinine Clearance: 101.8 mL/min (A) (by C-G formula based on SCr of 0.49 mg/dL (L)). Liver Function Tests: Recent Labs  Lab 12/04/22 0433 12/05/22 0438 12/08/22 0800 12/09/22 0455  AST 58* 70* 58* 59*  ALT 26 30 26 25   ALKPHOS 90 103 100 97  BILITOT 0.8 0.8 0.9 1.0  PROT 7.2 7.9 7.7 7.4  ALBUMIN 2.1* 2.2* 2.3* 2.1*   No results for input(s): "LIPASE", "AMYLASE" in the last 168 hours. Recent Labs  Lab 12/04/22 1348 12/09/22 0455  AMMONIA 56* 29   Coagulation Profile: Recent Labs  Lab 12/07/22 1806  INR 1.5*   Cardiac Enzymes: No results for input(s): "CKTOTAL", "CKMB", "CKMBINDEX", "TROPONINI" in the last 168 hours. BNP (last 3 results) No results for input(s): "PROBNP" in the last 8760 hours. HbA1C: No results for input(s): "HGBA1C" in the last 72 hours. CBG: Recent Labs  Lab 12/09/22 2023 12/10/22 0001 12/10/22 0537 12/10/22 0848 12/10/22 1135  GLUCAP 97 112* 114* 108* 107*   Lipid Profile: No results for input(s): "CHOL", "HDL", "LDLCALC", "TRIG", "CHOLHDL", "LDLDIRECT" in the last 72 hours. Thyroid Function Tests: No results for input(s): "TSH", "T4TOTAL", "FREET4", "T3FREE", "THYROIDAB" in the last 72 hours. Anemia Panel: No results for input(s): "VITAMINB12", "FOLATE", "FERRITIN", "TIBC", "IRON", "RETICCTPCT" in the last 72 hours. Most Recent Urinalysis On File:     Component Value Date/Time   COLORURINE AMBER (A) 11/22/2022 1941   APPEARANCEUR HAZY (A) 11/22/2022 1941   LABSPEC 1.027 11/22/2022 1941   PHURINE 5.0 11/22/2022 1941   GLUCOSEU NEGATIVE 11/22/2022 1941   HGBUR NEGATIVE  11/22/2022 1941   BILIRUBINUR NEGATIVE 11/22/2022 1941   KETONESUR NEGATIVE 11/22/2022 1941   PROTEINUR NEGATIVE 11/22/2022 1941   NITRITE NEGATIVE 11/22/2022 1941   LEUKOCYTESUR NEGATIVE 11/22/2022 1941   Sepsis Labs: @LABRCNTIP (procalcitonin:4,lacticidven:4) Microbiology: No results found for this or any previous visit (from the past 240 hour(s)).     Radiology Studies last 3 days: CT ABDOMEN PELVIS WO CONTRAST  Result Date: 12/09/2022 CLINICAL DATA:  Postoperative abdominal pain. Peg placement yesterday. Cirrhosis. EXAM: CT ABDOMEN AND PELVIS WITHOUT CONTRAST TECHNIQUE: Multidetector CT imaging of the abdomen and pelvis was performed following the standard protocol without IV contrast. RADIATION DOSE REDUCTION: This exam was performed according to the departmental dose-optimization program which includes automated exposure control, adjustment of the mA and/or kV according to patient size and/or use of iterative reconstruction technique. COMPARISON:  CT of the abdomen and pelvis 11/22/2022 and 11/09/2022. FINDINGS: Lower chest: Medial left basilar airspace disease likely reflects atelectasis. Mild atelectasis is present right. The heart size is normal. Hepatobiliary: Liver demonstrates a somewhat nodular appearance. No discrete lesions are present. Common bile duct is normal. Fluid surrounds the gallbladder contiguous with other perihepatic fluid Pancreas: A pancreatic pseudocyst the head of the pancreas is stable in size measuring 6.52 cm. Smaller cyst in the head of the pancreas not clearly visible. The cyst near the tail is stable in size at 3.2 cm. Focal mass lesion present. Spleen: Normal in size without focal abnormality. Adrenals/Urinary Tract: Adrenal glands are normal bilaterally. Exophytic lesions at the upper pole of the left kidney are stable. Stone or obstruction is present. The ureters are within normal limits bilaterally. The urinary bladder is. Stomach/Bowel: A large  hiatal hernia  is present. A PEG tube is in place. Contrast is contained within the stomach and proximal duodenum. The stomach and duodenum are otherwise within. Small bowel is unremarkable. Ileum is within normal limits. Appendix is not discretely visualized and may be surgically absent. Vascular/Lymphatic: Minimal atherosclerotic calcifications are present. No aneurysm is present. No significant retroperitoneal adenopathy is present. Subcentimeter nodes are present porta hepatis. Subcentimeter peripancreatic nodes are present stable. Reproductive: Prostate is unremarkable. Other: Diffuse abdominal ascites is slightly decreased prior CT. Extensive stranding in the mesentery noted. Multiple locules of nondependent free air consistent with recent PEG tube placement. Musculoskeletal: The vertebral heights and alignment are normal. Bony pelvis is normal. Hips are located and within normal limits. IMPRESSION: 1. PEG tube in place with multiple locules of nondependent free air consistent with recent PEG tube placement. No extravasation contrast from the stomach is present. 2. Diffuse abdominal ascites is slightly decreased. 3. Stable pancreatic pseudocysts. 4. Large hiatal hernia. 5. Stable exophytic lesions at the upper pole of the left kidney, likely cysts. Electronically Signed   By: Marin Roberts M.D.   On: 12/09/2022 13:35             LOS: 17 days       Sunnie Nielsen, DO [facility] 12/10/2022, 11:53 AM    Dictation software may have been used to generate the above note. Typos may occur and escape review in typed/dictated notes. Please contact Dr Lyn Hollingshead directly for clarity if needed.  Staff may message me via secure chat in Epic  but this may not receive an immediate response,  please page me for urgent matters!  If 7PM-7AM, please contact night coverage [email or web address]

## 2022-12-11 ENCOUNTER — Encounter: Payer: Self-pay | Admitting: Internal Medicine

## 2022-12-11 DIAGNOSIS — K852 Alcohol induced acute pancreatitis without necrosis or infection: Secondary | ICD-10-CM | POA: Diagnosis not present

## 2022-12-11 DIAGNOSIS — F1010 Alcohol abuse, uncomplicated: Secondary | ICD-10-CM | POA: Diagnosis not present

## 2022-12-11 DIAGNOSIS — E440 Moderate protein-calorie malnutrition: Secondary | ICD-10-CM | POA: Diagnosis not present

## 2022-12-11 DIAGNOSIS — Z720 Tobacco use: Secondary | ICD-10-CM | POA: Diagnosis not present

## 2022-12-11 LAB — GLUCOSE, CAPILLARY
Glucose-Capillary: 108 mg/dL — ABNORMAL HIGH (ref 70–99)
Glucose-Capillary: 129 mg/dL — ABNORMAL HIGH (ref 70–99)
Glucose-Capillary: 150 mg/dL — ABNORMAL HIGH (ref 70–99)
Glucose-Capillary: 114 mg/dL — ABNORMAL HIGH (ref 70–99)
Glucose-Capillary: 116 mg/dL — ABNORMAL HIGH (ref 70–99)

## 2022-12-11 MED ORDER — CALCIUM CARBONATE 1250 (500 CA) MG PO TABS
1000.0000 mg | ORAL_TABLET | Freq: Every day | ORAL | Status: DC
  Administered 2022-12-12 – 2022-12-19 (×8): 2500 mg
  Filled 2022-12-11 (×8): qty 2

## 2022-12-11 MED ORDER — QUETIAPINE FUMARATE 25 MG PO TABS
50.0000 mg | ORAL_TABLET | Freq: Every day | ORAL | Status: DC
  Administered 2022-12-11 – 2022-12-18 (×8): 50 mg
  Filled 2022-12-11 (×8): qty 2

## 2022-12-11 MED ORDER — OXYCODONE HCL 5 MG PO TABS: 10.0000 mg | ORAL_TABLET | Freq: Four times a day (QID) | ORAL | Status: DC | PRN

## 2022-12-11 MED ORDER — SENNOSIDES-DOCUSATE SODIUM 8.6-50 MG PO TABS
2.0000 | ORAL_TABLET | Freq: Every evening | ORAL | Status: DC | PRN
  Administered 2022-12-11: 2
  Filled 2022-12-11: qty 2

## 2022-12-11 NOTE — Progress Notes (Signed)
Physical Therapy Treatment Patient Details Name: Luke Rogers. MRN: [id number] DOB: [birth year] Today's Date: 12/11/2022   History of Present Illness Pt is a 62 y.o. male with medical history significant of alcohol abuse, GERD, hepatitis C, history of substance abuse, recurrent pancreatitis. To ED 11/22/22 w/ abd pain and bloating similar to previous pancreatitis episodes, has had several paracentesis this admission as well as NGT placed due to lethargy. S/p PEG tube on 12/08/22.    PT Comments    Patient supine in bed on arrival with general confusion throughout session. Seen in conjunction with OT to maximize patient's tolerance. Patient required maxA+2 for bed mobility and sit to stand with HHAx2. Difficult to take side steps at EOB with maxA+2. RN in room at end of session administering medications. Discharge plan remains appropriate.     Recommendations for follow up therapy are one component of a multi-disciplinary discharge planning process, led by the attending physician.  Recommendations may be updated based on patient status, additional functional criteria and insurance authorization.  Follow Up Recommendations  Can patient physically be transported by private vehicle: No    Assistance Recommended at Discharge Frequent or constant Supervision/Assistance  Patient can return home with the following A lot of help with walking and/or transfers;A lot of help with bathing/dressing/bathroom;Assist for transportation;Assistance with cooking/housework;Assistance with feeding   Equipment Recommendations  Rolling Croy Drumwright (2 wheels)    Recommendations for Other Services       Precautions / Restrictions Precautions Precautions: Fall Precaution Comments: PEG tube Restrictions Weight Bearing Restrictions: No     Mobility  Bed Mobility Overal bed mobility: Needs Assistance Bed Mobility: Supine to Sit, Sit to Supine     Supine to sit: Max assist, +2 for physical assistance, +2  for safety/equipment Sit to supine: Max assist        Transfers Overall transfer level: Needs assistance Equipment used: 2 person hand held assist Transfers: Sit to/from Stand Sit to Stand: Max assist, +2 safety/equipment, +2 physical assistance   Step pivot transfers: Max assist, +2 physical assistance, +2 safety/equipment       General transfer comment: maxA+2 to stand from EOB and take side steps towards [facility]    Ambulation/Gait                   Stairs             Wheelchair Mobility    Modified Rankin (Stroke Patients Only)       Balance Overall balance assessment: Needs assistance Sitting-balance support: Feet supported, Single extremity supported, Bilateral upper extremity supported Sitting balance-Leahy Scale: Fair Sitting balance - Comments: supervision-minA while seated EOB combing hair   Standing balance support: During functional activity, Reliant on assistive device for balance, Bilateral upper extremity supported Standing balance-Leahy Scale: Poor                              Cognition Arousal/Alertness: Awake/alert Behavior During Therapy: WFL for tasks assessed/performed Overall Cognitive Status: Impaired/Different from baseline Area of Impairment: Memory, Problem solving                     Memory: Decreased short-term memory Following Commands: Follows one step commands consistently, Follows multi-step commands inconsistently     Problem Solving: Requires verbal cues, Requires tactile cues, Difficulty sequencing General Comments: general confusion noted throughout session        Exercises      General  Comments        Pertinent Vitals/Pain Pain Assessment Pain Assessment: Faces Faces Pain Scale: Hurts little more Pain Location: abdomen Pain Descriptors / Indicators: Grimacing Pain Intervention(s): Monitored during session, Repositioned    Home Living                          Prior  Function            PT Goals (current goals can now be found in the care plan section) Acute Rehab PT Goals Patient Stated Goal: To return home safely PT Goal Formulation: With patient/family Time For Goal Achievement: 12/23/22 Potential to Achieve Goals: Fair Progress towards PT goals: Progressing toward goals    Frequency    Min 3X/week      PT Plan Current plan remains appropriate    Co-evaluation PT/OT/SLP Co-Evaluation/Treatment: Yes Reason for Co-Treatment: For patient/therapist safety;Necessary to address cognition/behavior during functional activity;Complexity of the patient's impairments (multi-system involvement) PT goals addressed during session: Mobility/safety with mobility;Balance;Proper use of DME        AM-PAC PT "6 Clicks" Mobility   Outcome Measure  Help needed turning from your back to your side while in a flat bed without using bedrails?: Total Help needed moving from lying on your back to sitting on the side of a flat bed without using bedrails?: Total Help needed moving to and from a bed to a chair (including a wheelchair)?: Total Help needed standing up from a chair using your arms (e.g., wheelchair or bedside chair)?: Total Help needed to walk in hospital room?: Total Help needed climbing 3-5 steps with a railing? : Total 6 Click Score: 6    End of Session   Activity Tolerance: Patient tolerated treatment well Patient left: in bed;with call bell/phone within reach;with bed alarm set;with nursing/sitter in room Nurse Communication: Mobility status PT Visit Diagnosis: Unsteadiness on feet (R26.81);Difficulty in walking, not elsewhere classified (R26.2);Muscle weakness (generalized) (M62.81)     Time: 1610-9604 PT Time Calculation (min) (ACUTE ONLY): 23 min  Charges:  $Therapeutic Activity: 8-22 mins                     Maylon Peppers, PT, DPT Physical Therapist - [facility]  [facility]    Leaann Nevils A  Damiya Sandefur 12/11/2022, 12:50 PM

## 2022-12-11 NOTE — TOC Initial Note (Addendum)
Transition of Care (TOC) - Initial/Assessment Note    Patient Details  Name: Luke Rogers. MRN: [id number] Date of Birth: 16-Nov-1960  Transition of Care [facility]) CM/SW Contact:    Allena Katz, LCSW Phone Number: 12/11/2022, 2:13 PM  Clinical Narrative:    Pt not fully oriented. CSW LVM with patients mother to discuss rehab options for pt. CSW spoke with patients significant other who is agreeable to rehab and would like referrals sent out for SNF in [place].        Patient Goals and CMS Choice            Expected Discharge Plan and Services                                              Prior Living Arrangements/Services                       Activities of Daily Living Home Assistive Devices/Equipment: None ADL Screening (condition at time of admission) Patient's cognitive ability adequate to safely complete daily activities?: Yes Is the patient deaf or have difficulty hearing?: No Does the patient have difficulty seeing, even when wearing glasses/contacts?: No Does the patient have difficulty concentrating, remembering, or making decisions?: No Patient able to express need for assistance with ADLs?: Yes Does the patient have difficulty dressing or bathing?: No Independently performs ADLs?: Yes (appropriate for developmental age) Does the patient have difficulty walking or climbing stairs?: No Weakness of Legs: Both Weakness of Arms/Hands: Both  Permission Sought/Granted                  Emotional Assessment              Admission diagnosis:  Alcoholic pancreatitis [K85.20] Alcohol-induced acute pancreatitis without infection or necrosis [K85.20] Patient Active Problem List   Diagnosis Date Noted   Protein-calorie malnutrition, severe 12/06/2022   Alcoholic pancreatitis 11/23/2022   Non-recurrent bilateral inguinal hernia without obstruction or gangrene 09/19/2022   Recurrent acute pancreatitis 07/17/2022    Pancreatic mass 07/17/2022   Left renal mass 07/17/2022   Hypomagnesemia 12/22/2021   GERD (gastroesophageal reflux disease) 12/22/2021   Visual hallucination    Accidental drug overdose    Hyponatremia 12/18/2021   Metabolic acidosis 12/18/2021   Hyperbilirubinemia 12/18/2021   Visual hallucinations 12/18/2021   Amphetamine intoxication (HCC) 12/18/2021   Amphetamine adverse reaction 12/18/2021   Alcohol abuse    Elevated LFTs    Thrombocytopenia (HCC)    Tobacco abuse    Acute alcoholic pancreatitis 01/17/2021   Nicotine dependence 01/17/2021   Transaminitis 01/17/2021   Malnutrition of moderate degree 01/09/2018   Acute purulent meningitis 01/08/2018   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 [place] [address] Phone: [phone] Fax: [phone]  [facility] - [place], [place] - [address] [place] [phone] Phone: [phone] Fax: [phone]     Social Determinants of Health (SDOH) Social History: SDOH Screenings   Food Insecurity: No Food Insecurity (11/23/2022)  Housing: Low Risk  (11/23/2022)  Transportation Needs: No Transportation Needs (11/23/2022)  Utilities: Not At Risk (11/23/2022)  Tobacco Use: High Risk (12/11/2022)   SDOH Interventions:     Readmission Risk Interventions     No data to display

## 2022-12-11 NOTE — Progress Notes (Signed)
Occupational Therapy Treatment Patient Details Name: Luke Rogers. MRN: [id number] DOB: [birth year] Today's Date: 12/11/2022   History of present illness Pt is a 62 y.o. male with medical history significant of alcohol abuse, GERD, hepatitis C, history of substance abuse, recurrent pancreatitis. To ED 11/22/22 w/ abd pain and bloating similar to previous pancreatitis episodes, has had several paracentesis this admission as well as NGT placed due to lethargy. S/p PEG tube on 12/08/22.   OT comments  Upon entering the room, pt supine in bed with RN present giving medications. Pt seen for skilled co-treatment with PT for safety. Pt appears internally and externally distracted and generally confused during session. He believes we are going to at birthday party and asking about getting cake and ice cream and also whispering bomb threat during session. Pt needing +2 assistance for safety with bed mobility as well as reported pain in abdomen with movement. Pt combs hair with visual feedback from mirror and supervision - CGA for balance to comb hair. Pt needing mod - max modal cuing as well as +2 assistance to stand and take several side steps along EOB. Pt returning to supine at end of session for RN to continue meds through PEG. Call bell and all needed items within reach.    Recommendations for follow up therapy are one component of a multi-disciplinary discharge planning process, led by the attending physician.  Recommendations may be updated based on patient status, additional functional criteria and insurance authorization.    Assistance Recommended at Discharge Frequent or constant Supervision/Assistance  Patient can return home with the following  Two people to help with walking and/or transfers;A lot of help with bathing/dressing/bathroom;Assistance with cooking/housework;Assist for transportation;Help with stairs or ramp for entrance;Assistance with feeding;Direct supervision/assist for  medications management;Direct supervision/assist for financial management   Equipment Recommendations  BSC/3in1       Precautions / Restrictions Precautions Precautions: Fall Precaution Comments: PEG tube Restrictions Weight Bearing Restrictions: No       Mobility Bed Mobility Overal bed mobility: Needs Assistance Bed Mobility: Supine to Sit, Sit to Supine     Supine to sit: Max assist, +2 for physical assistance, +2 for safety/equipment Sit to supine: Max assist        Transfers Overall transfer level: Needs assistance Equipment used: 2 person hand held assist Transfers: Sit to/from Stand Sit to Stand: Max assist, +2 safety/equipment, +2 physical assistance     Step pivot transfers: Max assist, +2 physical assistance, +2 safety/equipment     General transfer comment: maxA+2 to stand from EOB and take side steps towards [facility]     Balance Overall balance assessment: Needs assistance Sitting-balance support: Feet supported, Single extremity supported, Bilateral upper extremity supported Sitting balance-Leahy Scale: Fair Sitting balance - Comments: supervision-minA while seated EOB combing hair   Standing balance support: During functional activity, Reliant on assistive device for balance, Bilateral upper extremity supported Standing balance-Leahy Scale: Poor                             ADL either performed or assessed with clinical judgement   ADL Overall ADL's : Needs assistance/impaired     Grooming: Wash/dry hands;Wash/dry face;Minimal assistance Grooming Details (indicate cue type and reason): Pt brings all items to mouth at first as he is NPO. Pt washed face with increased cues and time to do so.             Lower Body Dressing:  Bed level;Maximal assistance Lower Body Dressing Details (indicate cue type and reason): to don B socks.                     Vision Patient Visual Report: No change from baseline            Cognition  Arousal/Alertness: Awake/alert Behavior During Therapy: Restless Overall Cognitive Status: Impaired/Different from baseline Area of Impairment: Memory, Problem solving, Following commands, Safety/judgement, Awareness                 Orientation Level: Time, Disoriented to, Situation   Memory: Decreased short-term memory Following Commands: Follows one step commands consistently, Follows multi-step commands inconsistently Safety/Judgement: Decreased awareness of safety, Decreased awareness of deficits Awareness: Intellectual Problem Solving: Requires verbal cues, Requires tactile cues, Difficulty sequencing                     Pertinent Vitals/ Pain       Pain Assessment Pain Assessment: Faces Faces Pain Scale: Hurts little more Pain Location: abdomen Pain Descriptors / Indicators: Grimacing, Discomfort Pain Intervention(s): Monitored during session, RN gave pain meds during session, Repositioned         Frequency  Min 3X/week        Progress Toward Goals  OT Goals(current goals can now be found in the care plan section)  Progress towards OT goals: Progressing toward goals     Plan Discharge plan remains appropriate;Frequency remains appropriate    Co-evaluation    PT/OT/SLP Co-Evaluation/Treatment: Yes Reason for Co-Treatment: For patient/therapist safety;Necessary to address cognition/behavior during functional activity;Complexity of the patient's impairments (multi-system involvement) PT goals addressed during session: Mobility/safety with mobility;Balance;Proper use of DME OT goals addressed during session: ADL's and self-care      AM-PAC OT "6 Clicks" Daily Activity     Outcome Measure   Help from another person eating meals?: Total Help from another person taking care of personal grooming?: A Little Help from another person toileting, which includes using toliet, bedpan, or urinal?: Total Help from another person bathing (including washing,  rinsing, drying)?: A Lot Help from another person to put on and taking off regular upper body clothing?: A Lot Help from another person to put on and taking off regular lower body clothing?: Total 6 Click Score: 10    End of Session Equipment Utilized During Treatment: Rolling walker (2 wheels)  OT Visit Diagnosis: Other abnormalities of gait and mobility (R26.89);Muscle weakness (generalized) (M62.81)   Activity Tolerance Patient tolerated treatment well   Patient Left in bed;with call bell/phone within reach;with bed alarm set;Other (comment)   Nurse Communication Mobility status        Time: 3762-8315 OT Time Calculation (min): 23 min  Charges: OT General Charges $OT Visit: 1 Visit OT Treatments $Self Care/Home Management : 8-22 mins  Jackquline Denmark, MS, OTR/L , CBIS ascom [phone]  12/11/22, 1:35 PM

## 2022-12-11 NOTE — Progress Notes (Signed)
PROGRESS NOTE    Luke Rogers.   ZOX:[id number] DOB: 06/21/1961  DOA: 11/23/2022 Date of Service: 12/11/22 PCP: Patient, No Pcp Per     Brief Narrative / Hospital Course:  Luke Rogers. is a 62 y.o. male with medical history significant of alcohol abuse, GERD, hepatitis C, history of substance abuse, recurrent pancreatitis. To ED 11/22/22 w/ abd pain and bloating similar to previous pancreatitis episodes (last one was 05/2022). Typical EtOH use 3 tallboys beer per day, recently cut down to 2. (+)intermittent tremor.  05/02: admitted to hospitalist service for treatment pancreatitis, lipase nearly 1200. Following RUQ [place]. CIWA protocol, NPO and IV fluids, protonix. Pt is motivated to quit alcohol and treat withdrawals.  05/03: lipase down to 223. CIWA 1 this morning (+)tremor but improving later today. Paracentesis removed 4L. No apparent SBP.  05/04: CIWA up, valium requirement havs increased. Starting lasix + spiro. Minimal UOP, more confused/agitated, placed Foley. Transferred to stepdown for closer monitoring on phenobarbital.  05/05: remains in SDU on phenobarbital taper protocol.  05/06: Lipase trending down. Repeat paracentesis ordered.  05/07: quite agitated, requiring ativan + phenobarb but CIWA scores better on this. Paracentesis repeated removing 2L --> IR's portal HTN clinic referral made  05/08: lethargic, not taking po, NG placed.  05/09: CXR right interstitial lung opacities, concern for CAP/aspiration, requiring 5L O2 [place], continuing ceftriaxone and azithro  05/10: more alert, SLP still unable to work w/ him  05/11: SLP still unable to complete eval. Significant oral secretions, holding tube feeds 05/12: restart tube feeds half usual rate 05/13: SLP- severe aspiration risk, continue NPO. Neurology consulted - thiamine repletion, concern for Wernicke's. MRI brain and EEG normal. Palliative consulted.  05/14: per neurology, anticipate will improve but this may be  slow and may need prolonged nursing care.  05/15: still lethargic/confused. Overnight coughed up mass after he pulled NG tube, sent for pathology, NG unable to be replaced. D/w GI and GenSurg re: possible PEG vs G/J tube placement - GI will see pt, will go from there. Plan family meeting tomorrow  05/16: d/w neuro - may expect weeks-mos for improvement in cognitive fxn, whatever his situation at 3 mos from now is likely going to be new baseline. Family meeting today, see IPAL note. Full code, full scope, attempt resuscitation but willing to revisit this issue and discuss DNR if he declines further. Goal for facility placement and see hoew he does over next 3 mos. 12/08/22 (Fri): PEG tube placed. PT/OT as able this weekend, anticipate placement conversations on Monday  05/18: confused but abd tender. CT abd/pelv - no acute findings or complications from recent procedure. PT/OT recs for SNF/STR.  05/19 (Sunday): stable, can start SNF/STR planning tomorrow  05/20: placement pending    Consultants:  Palliative Care GI (PEG tube placement)  Neurology   Procedures: 11/24/22 US Paracentesis --> 4L 11/28/22 US Paracentesis --> 2L  11/29/22: NG tube placed  12/08/22: PEG tube placed      ASSESSMENT & PLAN:   Principal Problem:   Alcoholic pancreatitis Active Problems:   Alcohol abuse   Tobacco abuse   GERD (gastroesophageal reflux disease)   Malnutrition of moderate degree   Acute alcoholic pancreatitis   Nicotine dependence   Alcohol withdrawal Delirium Tremens - resolved Acute metabolic encephalopathy secondary to alcohol withdrawal with Delirium Tremens as well as 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  Thiamine  Aspiration precaution  PEG placement done   Alcoholic cirrhosis w/ ascites S/p 2 Paracentesis  Low PMN, no  apparent SBP MELD 12 = 6% 58-month mortality  Child-Pugh Child Class B = Indication for transplant evaluation Will need GI and portal HTN clinic w/ IR follow up outpatient  furosemide and spironolactone EtOH abstinence encouraged  Repeat paracentesis as needed   Encephalopathy related dysphagia  Has pulled NG tube and this was unable to be re-placed Aspiration precautions  PEG feeds and meds SLP to follow   Acute hypoxic respiratory failure secondary to community-acquired pneumonia - resolved Abx have been d/c O2 supplementation as needed Repeat CXR as needed  Delirium D/t EtOH withdrawal Ammonia was very slightly above WNL, do not think hepatic encephalopathy Given constipation as well, will give lactulose as needed Consider scheduled lactulose, will trend ammonia --> has been WNL  Pancreatic pseudocyst Repeat imaging no concerns   Hypocalcemia Replacing Monitor   Hypomagnesemia  Replacing  Monitor  Alcohol abuse/dependence counseled on importance of cessation when he was lucid CIWA protocol completed Continue to monitor - per fiancee he is still asking for alcohol when delirious   GERD (gastroesophageal reflux disease) Hiatal hernia  PPI   Tobacco abuse Patient is not ready to quit (asked when lucid) Nicotine patch Counseled on potential complications of smoking extensively, counseled on importance of cessation  Constipation Bowel regimen / as needed  Alcoholic pancreatitis with lipase nearly 1200 on admission  resolved  Abdominal pain Concern for complication from PEG placement, possible SBP/ascites, other. CT showed no concerns. Pain appears resolved. Monitor   Expectoration of questionable lung/gastric mass 05/15 overnight  See photo in media Coughed this up after pulling NG tube Sent for pathology --> mucoid tissue w/ focal reactive squamous epithelium, no ciliated respiratory epithelium, likely upper aerodigestive tract origin, no malignancy     DVT prophylaxis: lovenox  Pertinent IV fluids/nutrition: tube feeds  Central lines / invasive devices: none  Code Status: FULL CODE ACP documentation reviewed: 05/02 none on file in [facility]   Current Admission Status: inpatient  TOC needs / Dispo plan: need SNF/STR  Barriers to discharge / significant pending items: placement              Subjective / Brief ROS:  Patient alert today but disoriented, per notes was more coherent overnight but still pulling at tubes/bandages   Family Communication: none at this time     Objective Findings:  Vitals:   12/10/22 2356 12/11/22 0219 12/11/22 0826 12/11/22 0840  BP: 103/71  102/69   Pulse: 84  (!) 106 98  Resp: 16  18   Temp: 99.2 F (37.3 C)  (!) 97.5 F (36.4 C)   TempSrc:      SpO2: 97%  95%   Weight:  71.4 kg    Height:        Intake/Output Summary (Last 24 hours) at 12/11/2022 1118 Last data filed at 12/11/2022 0731 Gross per 24 hour  Intake 2373.33 ml  Output 1875 ml  Net 498.33 ml   Filed Weights   12/09/22 0500 12/10/22 0500 12/11/22 0219  Weight: 64.9 kg 72.4 kg 71.4 kg    Examination:  Physical Exam Constitutional:      General: He is not in acute distress.    Appearance: He is not ill-appearing.  Cardiovascular:     Rate and Rhythm: Normal rate and regular rhythm.  Pulmonary:     Breath sounds: Normal breath sounds.  Abdominal:  General: Bowel sounds are normal.     Tenderness: There is no abdominal tenderness. There is no guarding.  Skin:    General: Skin is warm and dry.  Neurological:     Mental Status: He is alert. He is disoriented.  Psychiatric:        Mood and Affect: Mood is not anxious.          Scheduled Medications:   calcium carbonate  1,000 mg of elemental calcium Per Tube Q breakfast   enoxaparin (LOVENOX) injection  40 mg Subcutaneous Q24H   famotidine  20 mg Per Tube Daily   feeding supplement (PROSource TF20)  60 mL Per Tube Daily   folic acid  1 mg Per  Tube Daily   free water  140 mL Per Tube Q4H   furosemide  20 mg Per Tube Daily   multivitamin with minerals  1 tablet Per Tube Daily   neomycin-bacitracin-polymyxin  1 Application Topical Daily   nicotine  21 mg Transdermal Daily   mouth rinse  15 mL Mouth Rinse 4 times per day   pneumococcal 20-valent conjugate vaccine  0.5 mL Intramuscular Tomorrow-1000   spironolactone  12.5 mg Per Tube BID   thiamine  100 mg Per Tube Daily    Continuous Infusions:  feeding supplement (OSMOLITE 1.5 CAL) 65 mL/hr at 12/10/22 2216    PRN Medications:  acetaminophen **OR** acetaminophen, dextrose, lactulose, LORazepam, morphine injection, mouth rinse, mouth rinse, oxyCODONE, senna-docusate  Antimicrobials from admission:  Anti-infectives (From admission, onward)    Start     Dose/Rate Route Frequency Ordered Stop   12/08/22 1245  ceFAZolin (ANCEF) IVPB 1 g/50 mL premix        1 g 100 mL/hr over 30 Minutes Intravenous  Once 12/08/22 1154 12/08/22 1532   12/04/22 1530  fluconazole (DIFLUCAN) IVPB 100 mg        100 mg 50 mL/hr over 60 Minutes Intravenous Every 24 hours 12/04/22 1322 12/08/22 1715   11/30/22 1000  azithromycin (ZITHROMAX) 500 mg in sodium chloride 0.9 % 250 mL IVPB        500 mg 250 mL/hr over 60 Minutes Intravenous Every 24 hours 11/30/22 0827 12/04/22 1038   11/30/22 0930  cefTRIAXone (ROCEPHIN) 2 g in sodium chloride 0.9 % 100 mL IVPB        2 g 200 mL/hr over 30 Minutes Intravenous Every 24 hours 11/30/22 0827 12/04/22 9528           Data Reviewed:  I have personally reviewed the following...  CBC: Recent Labs  Lab 12/05/22 0438 12/06/22 0438 12/08/22 0800 12/09/22 0455 12/10/22 0458  WBC 8.6 8.8 8.2 9.3 10.5  NEUTROABS 5.8 5.8  --   --   --   HGB 11.9* 11.6* 12.1* 11.6* 11.5*  HCT 35.7* 34.1* 35.1* 33.7* 33.7*  MCV 97.0 94.7 93.4 93.9 93.4  PLT 311 339 348 318 313   Basic Metabolic Panel: Recent Labs  Lab 12/05/22 0438 12/06/22 0438 12/08/22 0800  12/08/22 1628 12/09/22 0455 12/09/22 1649 12/10/22 0458  NA 139 138 137  --  133*  --  131*  K 3.6 3.6 3.6  --  3.5  --  3.8  CL 108 107 107  --  106  --  102  CO2 26 23 23   --  22  --  26  GLUCOSE 112* 102* 96  --  108*  --  120*  BUN 21* 16 15  --  12  --  13  CREATININE 0.46* 0.52* 0.57*  --  0.45*  --  0.49*  CALCIUM 8.4* 8.3* 8.2*  --  7.9*  --  7.6*  MG  --   --   --  1.7 1.6* 1.7 1.7  PHOS  --   --   --  3.9 3.0 3.8 3.0   GFR: Estimated Creatinine Clearance: 100.4 mL/min (A) (by C-G formula based on SCr of 0.49 mg/dL (L)). Liver Function Tests: Recent Labs  Lab 12/05/22 0438 12/08/22 0800 12/09/22 0455  AST 70* 58* 59*  ALT 30 26 25   ALKPHOS 103 100 97  BILITOT 0.8 0.9 1.0  PROT 7.9 7.7 7.4  ALBUMIN 2.2* 2.3* 2.1*   No results for input(s): "LIPASE", "AMYLASE" in the last 168 hours. Recent Labs  Lab 12/04/22 1348 12/09/22 0455  AMMONIA 56* 29   Coagulation Profile: Recent Labs  Lab 12/07/22 1806  INR 1.5*   Cardiac Enzymes: No results for input(s): "CKTOTAL", "CKMB", "CKMBINDEX", "TROPONINI" in the last 168 hours. BNP (last 3 results) No results for input(s): "PROBNP" in the last 8760 hours. HbA1C: No results for input(s): "HGBA1C" in the last 72 hours. CBG: Recent Labs  Lab 12/10/22 1732 12/10/22 1951 12/10/22 2356 12/11/22 0504 12/11/22 0826  GLUCAP 135* 142* 104* 108* 129*   Lipid Profile: No results for input(s): "CHOL", "HDL", "LDLCALC", "TRIG", "CHOLHDL", "LDLDIRECT" in the last 72 hours. Thyroid Function Tests: No results for input(s): "TSH", "T4TOTAL", "FREET4", "T3FREE", "THYROIDAB" in the last 72 hours. Anemia Panel: No results for input(s): "VITAMINB12", "FOLATE", "FERRITIN", "TIBC", "IRON", "RETICCTPCT" in the last 72 hours. Most Recent Urinalysis On File:     Component Value Date/Time   COLORURINE AMBER (A) 11/22/2022 1941   APPEARANCEUR HAZY (A) 11/22/2022 1941   LABSPEC 1.027 11/22/2022 1941   PHURINE 5.0 11/22/2022 1941    GLUCOSEU NEGATIVE 11/22/2022 1941   HGBUR NEGATIVE 11/22/2022 1941   BILIRUBINUR NEGATIVE 11/22/2022 1941   KETONESUR NEGATIVE 11/22/2022 1941   PROTEINUR NEGATIVE 11/22/2022 1941   NITRITE NEGATIVE 11/22/2022 1941   LEUKOCYTESUR NEGATIVE 11/22/2022 1941   Sepsis Labs: @LABRCNTIP (procalcitonin:4,lacticidven:4) Microbiology: No results found for this or any previous visit (from the past 240 hour(s)).     Radiology Studies last 3 days: CT ABDOMEN PELVIS WO CONTRAST  Result Date: 12/09/2022 CLINICAL DATA:  Postoperative abdominal pain. Peg placement yesterday. Cirrhosis. EXAM: CT ABDOMEN AND PELVIS WITHOUT CONTRAST TECHNIQUE: Multidetector CT imaging of the abdomen and pelvis was performed following the standard protocol without IV contrast. RADIATION DOSE REDUCTION: This exam was performed according to the departmental dose-optimization program which includes automated exposure control, adjustment of the mA and/or kV according to patient size and/or use of iterative reconstruction technique. COMPARISON:  CT of the abdomen and pelvis 11/22/2022 and 11/09/2022. FINDINGS: Lower chest: Medial left basilar airspace disease likely reflects atelectasis. Mild atelectasis is present right. The heart size is normal. Hepatobiliary: Liver demonstrates a somewhat nodular appearance. No discrete lesions are present. Common bile duct is normal. Fluid surrounds the gallbladder contiguous with other perihepatic fluid Pancreas: A pancreatic pseudocyst the head of the pancreas is stable in size measuring 6.52 cm. Smaller cyst in the head of the pancreas not clearly visible. The cyst near the tail is stable in size at 3.2 cm. Focal mass lesion present. Spleen: Normal in size without focal abnormality. Adrenals/Urinary Tract: Adrenal glands are normal bilaterally. Exophytic lesions at the upper pole of the left kidney are stable. Stone or obstruction is present. The ureters are within normal limits  bilaterally. The  urinary bladder is. Stomach/Bowel: A large hiatal hernia is present. A PEG tube is in place. Contrast is contained within the stomach and proximal duodenum. The stomach and duodenum are otherwise within. Small bowel is unremarkable. Ileum is within normal limits. Appendix is not discretely visualized and may be surgically absent. Vascular/Lymphatic: Minimal atherosclerotic calcifications are present. No aneurysm is present. No significant retroperitoneal adenopathy is present. Subcentimeter nodes are present porta hepatis. Subcentimeter peripancreatic nodes are present stable. Reproductive: Prostate is unremarkable. Other: Diffuse abdominal ascites is slightly decreased prior CT. Extensive stranding in the mesentery noted. Multiple locules of nondependent free air consistent with recent PEG tube placement. Musculoskeletal: The vertebral heights and alignment are normal. Bony pelvis is normal. Hips are located and within normal limits. IMPRESSION: 1. PEG tube in place with multiple locules of nondependent free air consistent with recent PEG tube placement. No extravasation contrast from the stomach is present. 2. Diffuse abdominal ascites is slightly decreased. 3. Stable pancreatic pseudocysts. 4. Large hiatal hernia. 5. Stable exophytic lesions at the upper pole of the left kidney, likely cysts. Electronically Signed   By: Marin Roberts M.D.   On: 12/09/2022 13:35             LOS: 18 days       Sunnie Nielsen, DO [facility] 12/11/2022, 11:18 AM    Dictation software may have been used to generate the above note. Typos may occur and escape review in typed/dictated notes. Please contact Dr Lyn Hollingshead directly for clarity if needed.  Staff may message me via secure chat in Epic  but this may not receive an immediate response,  please page me for urgent matters!  If 7PM-7AM, please contact night coverage [email or web address]

## 2022-12-11 NOTE — Progress Notes (Signed)
Patient is more alert & coherent during night. At pm shift change, patient c/o abdominal pain. Has removed his abd binder, removed lower tele leads, and removed dressing from peg tube. Patient states earlier rx (Ativan 1mg  IM at 1833) provided approximately 20 min relief.  Increased distention noted in lower abdoment. Pt grimacing, increased respirations, crying out in pain on movement or light touch. Made NP aware. Oxicodone prn given via tube. Patient required Morphine 2mg  IV x2  throughout night. PRN lactulose given at 0600 due to no BM documented since 5/16. Pt rested intermittently after pain rx given. Does request to speak with MD during rounds this am.

## 2022-12-12 DIAGNOSIS — F1010 Alcohol abuse, uncomplicated: Secondary | ICD-10-CM | POA: Diagnosis not present

## 2022-12-12 DIAGNOSIS — K852 Alcohol induced acute pancreatitis without necrosis or infection: Secondary | ICD-10-CM | POA: Diagnosis not present

## 2022-12-12 DIAGNOSIS — Z720 Tobacco use: Secondary | ICD-10-CM | POA: Diagnosis not present

## 2022-12-12 DIAGNOSIS — E440 Moderate protein-calorie malnutrition: Secondary | ICD-10-CM | POA: Diagnosis not present

## 2022-12-12 LAB — COMPREHENSIVE METABOLIC PANEL
ALT: 27 U/L (ref 0–44)
AST: 52 U/L — ABNORMAL HIGH (ref 15–41)
Albumin: 2.2 g/dL — ABNORMAL LOW (ref 3.5–5.0)
Alkaline Phosphatase: 109 U/L (ref 38–126)
Anion gap: 6 (ref 5–15)
BUN: 13 mg/dL (ref 6–20)
CO2: 25 mmol/L (ref 22–32)
Calcium: 8.1 mg/dL — ABNORMAL LOW (ref 8.9–10.3)
Chloride: 99 mmol/L (ref 98–111)
Creatinine, Ser: 0.51 mg/dL — ABNORMAL LOW (ref 0.61–1.24)
GFR, Estimated: >60 mL/min (ref >60)
Glucose, Bld: 113 mg/dL — ABNORMAL HIGH (ref 70–99)
Potassium: 4.1 mmol/L (ref 3.5–5.1)
Sodium: 130 mmol/L — ABNORMAL LOW (ref 135–145)
Total Bilirubin: 0.6 mg/dL (ref 0.3–1.2)
Total Protein: 7.9 g/dL (ref 6.5–8.1)

## 2022-12-12 LAB — CBC
HCT: 34.2 % — ABNORMAL LOW (ref 39.0–52.0)
Hemoglobin: 11.8 g/dL — ABNORMAL LOW (ref 13.0–17.0)
MCH: 32 pg (ref 26.0–34.0)
MCHC: 34.5 g/dL (ref 30.0–36.0)
MCV: 92.7 fL (ref 80.0–100.0)
Platelets: 299 10*3/uL (ref 150–400)
RBC: 3.69 MIL/uL — ABNORMAL LOW (ref 4.22–5.81)
RDW: 13.2 % (ref 11.5–15.5)
WBC: 10.2 10*3/uL (ref 4.0–10.5)
nRBC: 0 % (ref 0.0–0.2)

## 2022-12-12 LAB — GLUCOSE, CAPILLARY
Glucose-Capillary: 110 mg/dL — ABNORMAL HIGH (ref 70–99)
Glucose-Capillary: 120 mg/dL — ABNORMAL HIGH (ref 70–99)
Glucose-Capillary: 130 mg/dL — ABNORMAL HIGH (ref 70–99)
Glucose-Capillary: 134 mg/dL — ABNORMAL HIGH (ref 70–99)
Glucose-Capillary: 135 mg/dL — ABNORMAL HIGH (ref 70–99)
Glucose-Capillary: 140 mg/dL — ABNORMAL HIGH (ref 70–99)

## 2022-12-12 LAB — MAGNESIUM: Magnesium: 1.8 mg/dL (ref 1.7–2.4)

## 2022-12-12 LAB — AMMONIA: Ammonia: 37 umol/L — ABNORMAL HIGH (ref 9–35)

## 2022-12-12 MED ORDER — MORPHINE SULFATE (PF) 2 MG/ML IV SOLN
2.0000 mg | INTRAVENOUS | Status: DC | PRN
  Administered 2022-12-13 – 2022-12-19 (×22): 2 mg via INTRAVENOUS
  Filled 2022-12-12 (×24): qty 1

## 2022-12-12 MED ORDER — POLYETHYLENE GLYCOL 3350 17 G PO PACK
17.0000 g | PACK | Freq: Two times a day (BID) | ORAL | Status: DC
  Administered 2022-12-12 – 2022-12-18 (×7): 17 g
  Filled 2022-12-12 (×8): qty 1

## 2022-12-12 MED ORDER — OXYCODONE HCL 5 MG PO TABS
10.0000 mg | ORAL_TABLET | Freq: Four times a day (QID) | ORAL | Status: AC
  Administered 2022-12-12 – 2022-12-14 (×8): 10 mg
  Filled 2022-12-12 (×8): qty 2

## 2022-12-12 NOTE — Progress Notes (Addendum)
Nutrition Follow-up/  DOCUMENTATION CODES:   Severe malnutrition in context of acute illness/injury  INTERVENTION:   -Continue TF via PEG:    Osmolite 1.5 @ 65 ml/hr   60 ml Prosource TF daily    140 ml free water flush every 4 hours   Tube feeding regimen provides 2420 kcal (100% of needs), 118 grams of protein, and 1189 ml of H2O. Total free water: 2029 ml daily   -Continue MVI, thiamine and folic acid daily via tube    -Continue daily weights   -Case discussed with MD, RN, and SLP; ordered SLP eval as pt is now more alert and requesting to eat  NUTRITION DIAGNOSIS:   Severe Malnutrition related to acute illness as evidenced by severe muscle depletion, severe fat depletion.  Ongoing  GOAL:   Patient will meet greater than or equal to 90% of their needs  Met with TF  MONITOR:   Diet advancement, Labs, Weight trends, TF tolerance, Skin, I & O's  REASON FOR ASSESSMENT:   Consult Enteral/tube feeding initiation and management  ASSESSMENT:   62 y/o male with h/o etoh abuse, substance abuse, hepatitis C, GERD, hiatal hernia and cirrhosis who is admitted with acutre pancreatitis and pancreatic pseudocyst.  -Pt s/p paracentesis 5/3 & 5/7 - Pt s/p NGT placement 5/8 (now removed) 5/17- PEG placed  Reviewed I/O's: -315 ml x 24 hours and -3.8 L since 11/28/22  UOP: 1.9 L x 24 hours  Spoke with pt and significant other at bedside. She reports that pt is more alert and talkative; this is the first time he has spoken coherently to her in the past 21 days. Pt agitated, but able to answer most questions appropriately. Pt shares that he is frustrated that he is in the hospital and unable to eat and take his medications. RD reviewed current nutrition plan of care and rationale for TF at this time (discussed that this is now he was receiving nutrition when his medical state prohibited him from taking PO's).   TF infusing via PEG: Osmolite 1.5 at goal rate of 65 ml/hr. Pt  tolerating well. He denies any nausea, vomiting, or abdominal pain.   Wt has been stable over the past week.   Discussed rationale for TF and potential for PO's pending SLP evaluation. Pt eager for this, as he would like to eat. Findings discussed with SLP, RN, and MD; SLP eval ordered for swallow safety.  Even if pt is able to be advanced for PO diet, will continue TF until pt is able to to determine that he can consistently take adequate PO's.   Per TOC notes, plan for SNF at discharge.   Medications reviewed and include calcium carbonate, folic acid, lasix, aldactone, and thiamine.   Labs reviewed: Na: 130, K, Mg, and Phos WDL. CBGS: 140 (inpatient orders for glycemic control are ).    Diet Order:   Diet Order             Diet NPO time specified  Diet effective now                   EDUCATION NEEDS:   No education needs have been identified at this time  Skin:  Skin Assessment: Skin Integrity Issues: Skin Integrity Issues:: Incisions Incisions: lt lower abdomen  Last BM:  12/08/22  Height:   Ht Readings from Last 1 Encounters:  12/08/22 6\' 2"  (1.88 m)    Weight:   Wt Readings from Last 1 Encounters:  12/12/22  68.9 kg    Ideal Body Weight:  86.4 kg  BMI:  Body mass index is 19.5 kg/m.  Estimated Nutritional Needs:   Kcal:  2200-2500kcal/day  Protein:  110-125g/day  Fluid:  > 2 L    Levada Schilling, RD, LDN, CDCES Registered Dietitian II Certified Diabetes Care and Education Specialist Please refer to [facility] for RD and/or RD on-call/weekend/after hours pager

## 2022-12-12 NOTE — Progress Notes (Addendum)
Speech Language Pathology Treatment: Dysphagia  Patient Details Name: Luke Rogers. MRN: [id number] DOB: [birth year] Today's Date: 12/12/2022 Time: 1530-1600 SLP Time Calculation (min) (ACUTE ONLY): 30 min  Assessment / Plan / Recommendation Clinical Impression  Pt seen today for ongoing assessment of safety w/ oral intake; trials of Single ice chips for Pleasure PRN(w/ Supervision).  Pt has had his PEG placed for nutrition/hydration needs in setting of ongoing mental status decline(see h/o polysubstance abuse, ETOH abuse) and inability to safely attend/alert to taking an oral diet during this admit. Pt is also currently having abdominal Pain and requires Pain meds for comfort(which also impacts his alertness/attention).  Today, pt is exhibiting min increased engagement in therapies and w/ others per MD. Request for assessment for appropriateness for something for Pleasure made to ST services. Pt was seen for Pleasure po ice chips which he appeared to tolerate w/out overt clinical s/s of aspiration; no decline in respiratory status during/post oral intake. HOWEVER, pt has challenging factors that increase risk for aspiration w/ ANY oral intake to include decline mental status impacting attention, Pain and sedating effect/impact of Pain medications, and inability to sit Fully upright d/t abdominal Pain. He also requires feeding support. These factors (all) can impact safety of swallowing and increase risk for aspiration/aspiration pneumonia.  Recommend addition of Pleasure PRN Single ice chips w/ NSG Supervision; strict aspiration precautions. Oral care must be given before ice chips.  As pt moves to his next venue of care and hopefully improves both medically and Cognitively, he can have f/u ST services for assessment of safety of swallowing and movement toward an oral diet. (See Neurology note)  The above was discussed w/ MD and NSG; precautions posted in room. No further skilled ST services  indicated during Acuity of illness and current mental status. Recommend f/u at next venue of care as Acuity of illness lessens. Updated TOC also. MD agreed.     HPI HPI: Pt is a 62 y.o. male with medical history significant of alcohol abuse(current), GERD, hepatitis C, hiatal hernia, history of polysubstance abuse, recurrent pancreatitis, and more presents the ED with a chief complaint of bloating and sharp pain diffusely throughout his abdomen and wrapping around the right side of his body. At admit, he reported he has had nausea and vomiting 2 times per day.  The emesis is nonbloody.  His last normal meal was 2 days ago.  His last normal bowel movement was 2 days ago.  His last bout of pancreatitis was in November.  He has recently tried to cut down his drinking; ongoing tobacco use; per H&P chart note: "He has been smoking an average of .5 packs per day. He has been exposed to tobacco smoke. He has never used smokeless tobacco. He reports current alcohol use of about 63.0 standard drinks of alcohol per week. He reports current drug use. Drug: Amphetamines.".   CT of Abd/CXR: Hiatal hernia(mod) containing stomach and ascites(mod amount) as seen on recent CT  abdomen and pelvis; Acute on chronic pancreatitis, hepatic cirrhosis, lower lungs no acute findings.  PEG placement opted for by Family; placed 12/08/2022 for nutrition needs -- abd tender per chart notes/pt report/NSG(12/12/2022).   Per Neurology note in chart: "neuro - may expect weeks-mos for improvement in cognitive fxn, whatever his situation at 3 mos from now is likely going to be new baseline. Family meeting today, see IPAL note. Full code, full scope, attempt resuscitation but willing to revisit this issue and discuss DNR if he  declines further. Goal for facility placement and see how he does over next 3 mos.".      SLP Plan  All goals met;Other (Comment) (pt to have f/u at next venue of care post Acuity of care and resolution of Pain(w/ less need  for Pain meds))      Recommendations for follow up therapy are one component of a multi-disciplinary discharge planning process, led by the attending physician.  Recommendations may be updated based on patient status, additional functional criteria and insurance authorization.    Recommendations  Diet recommendations: NPO;Other(comment) (PEG TFs; Pleasure Ice Chips post oral care) Medication Administration: Via alternative means (PEG) Supervision: Full supervision/cueing for compensatory strategies Compensations: Minimize environmental distractions;Slow rate (Single chips w/ NSG) Postural Changes and/or Swallow Maneuvers: Seated upright 90 degrees                 (ST f/u at next venue of care) Oral care QID;Oral care prior to ice chip/H20;Staff/trained caregiver to provide oral care   Frequent or constant Supervision/Assistance Dysphagia, oropharyngeal phase (R13.12) (reduced alertness/attention)     All goals met;Other (Comment) (pt to have f/u at next venue of care post Acuity of care and resolution of Pain(w/ less need for Pain meds))       Jerilynn Som, MS, CCC-SLP Speech Language Pathologist Rehab Services; [facility] [phone] (ascom) Wanda Cellucci  12/12/2022, 4:59 PM

## 2022-12-12 NOTE — Progress Notes (Signed)
PROGRESS NOTE    Luke Rogers.   ZOX:[id number] DOB: [birth year]  DOA: 11/23/2022 Date of Service: 12/12/22 PCP: Patient, No Pcp Per     Brief Narrative / Hospital Course:  Jupiter Reichow. is a 62 y.o. male with medical history significant of alcohol abuse, GERD, hepatitis C, history of substance abuse, recurrent pancreatitis. To ED 11/22/22 w/ abd pain and bloating similar to previous pancreatitis episodes (last one was 05/2022). Typical EtOH use 3 tallboys beer per day, recently cut down to 2. (+)intermittent tremor.  05/02: admitted to hospitalist service for treatment pancreatitis. Pt is motivated to quit alcohol and treat withdrawals.  05/03: pain/lipase improving. Paracentesis removed 4L. No apparent SBP.  05/04: CIWA up. Minimal UOP, more confused/agitated, placed Foley. Transferred to stepdown for closer monitoring on phenobarbital.  05/05-05/06: remains in SDU on phenobarbital taper protocol.  05/07: quite agitated, requiring ativan + phenobarb but CIWA scores better on this. Paracentesis repeated removing 2L  05/08: lethargic, not taking po, NG placed.  05/09: CXR right interstitial lung opacities, concern for pneumonia/aspiration, requiring 5L O2 [place], ceftriaxone and azithro  05/10-05/12: more alert, but SLP still unable to work w/ him  05/13: SLP reeval - severe aspiration risk, continue NPO. Neurology consulted. MRI brain and EEG normal. Palliative consulted.  05/14: per neurology, anticipate will improve but this may be slow and may need prolonged nursing care.  05/15: still lethargic/confused. Overnight coughed up mass after he pulled NG tube, sent for pathology, NG unable to be replaced. D/w GI for PEG 05/16:  Family meeting today, see IPAL note.  12/08/22 (Fri): PEG tube placed. 05/18: confused but abd tender. CT abd/pelv - no acute findings or complications from recent procedure. PT/OT recs for SNF/STR.  05/19-05/20: placement pending which may be a challenge  given significant intermittent confusion. Has not needed sitter but has needed mitts.  05/21: significant cognitive improvement today, carrying on coherent conversation, oriented to year, place, person.    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  Thiamine   Alcoholic cirrhosis w/ ascites S/p Paracentesis x2 Low PMN, no apparent SBP MELD 12 = 6% 10-month mortality  Child-Pugh Child Class B = Indication for transplant evaluation Will need GI and portal HTN clinic w/ IR follow up outpatient  furosemide and spironolactone titrate as tolerated  EtOH abstinence encouraged  Repeat paracentesis as needed   Encephalopathy related dysphagia  Has pulled NG tube and this was unable to be re-placed Expect may need long-term tube feeds  Aspiration precautions  PEG feeds and meds SLP to follow - asked to reevaluate today given improvement   Acute hypoxic respiratory failure secondary to community-acquired pneumonia - resolved Abx have been d/c O2 supplementation as needed - has not been necessary   Delirium -  improved Ammonia was  very slightly above WNL, but trended back down Given constipation, will give lactulose as needed Consider  scheduled lactulose, will trend ammonia --> has been WNL Intermittently follow ammonium  Seroquel qhs  Pancreatic pseudocyst Repeat imaging no concerns   Hypocalcemia Replacing Monitor   Hyponatremia Expect will be chronic given EtOH related liver disease  Monitor BMP  Hypomagnesemia  Replacing  Monitor  Alcohol abuse/dependence counseled on importance of cessation when he was lucid CIWA protocol completed Continue to monitor - per fiancee he is still asking for alcohol when delirious   GERD (gastroesophageal reflux disease) Hiatal hernia  PPI   Tobacco abuse Patient is not ready to quit (asked when lucid) Nicotine patch Counseled on potential complications of smoking extensively, counseled on importance of cessation  Constipation Bowel regimen / as needed  Alcoholic pancreatitis with lipase nearly 1200 on admission  resolved  Abdominal pain Concern for complication from PEG placement, possible SBP/ascites, other. CT showed no concerns. Pain appears resolved/intermittenr.  Monitor  Pain control   Expectoration of questionable lung/gastric mass 05/15 overnight  See photo in media Coughed this up after pulling NG tube Sent for pathology --> mucoid tissue w/ focal reactive squamous epithelium, no ciliated respiratory epithelium, likely upper aerodigestive tract origin, no malignancy    DVT prophylaxis: lovenox  Pertinent IV fluids/nutrition: tube feeds  Central lines / invasive devices: none  Code Status: FULL CODE ACP documentation reviewed: 05/02 none on file in [facility]   Current Admission Status: inpatient  TOC needs / Dispo plan: need SNF/STR  Barriers to discharge / significant pending items: placement              Subjective / Brief ROS:  Patient much more alert today, conversational. Steffanie Rainwater agrees he is not quite at baseline but significantly more himself today, joking around, still some confusion   Family Communication: [name] is  at bedside on rounds     Objective Findings:  Vitals:   12/11/22 2359 12/12/22 0405 12/12/22 0444 12/12/22 0843  BP: 100/67 109/67  113/70  Pulse: 90 96  94  Resp: 14 18  16   Temp:  99.6 F (37.6 C)  97.8 F (36.6 C)  TempSrc:  Oral    SpO2: 96% 96%  95%  Weight:   68.9 kg   Height:        Intake/Output Summary (Last 24 hours) at 12/12/2022 1345 Last data filed at 12/12/2022 0442 Gross per 24 hour  Intake 1560 ml  Output 1600 ml  Net -40 ml   Filed Weights   12/10/22 0500 12/11/22 0219 12/12/22 0444  Weight: 72.4 kg 71.4 kg 68.9 kg    Examination:  Physical Exam Constitutional:      General: He is not in acute distress.    Appearance: He is not ill-appearing.  Cardiovascular:     Rate and Rhythm: Normal rate and regular rhythm.     Heart sounds: Normal heart sounds.  Pulmonary:     Breath sounds: Normal breath sounds.  Abdominal:     General: Abdomen is flat. Bowel sounds are normal.     Palpations: Abdomen is soft.     Tenderness: There is abdominal tenderness in the right upper quadrant. There is no guarding or rebound.  Skin:    General: Skin is warm and dry.  Neurological:     General: No focal deficit present.     Mental Status: He is alert.  Psychiatric:        Mood and Affect: Mood  is not anxious.        Behavior: Behavior normal.          Scheduled Medications:   calcium carbonate  1,000 mg of elemental calcium Per Tube Q0600   enoxaparin (LOVENOX) injection  40 mg Subcutaneous Q24H   famotidine  20 mg Per Tube Daily   feeding supplement (PROSource TF20)  60 mL Per Tube Daily   folic acid  1 mg Per Tube Daily   free water  140 mL Per Tube Q4H   furosemide  20 mg Per Tube Daily   multivitamin with minerals  1 tablet Per Tube Daily   neomycin-bacitracin-polymyxin  1 Application Topical Daily   nicotine  21 mg Transdermal Daily   mouth rinse  15 mL Mouth Rinse 4 times per day   oxyCODONE  10 mg Per Tube Q6H   pneumococcal 20-valent  conjugate vaccine  0.5 mL Intramuscular Tomorrow-1000   polyethylene glycol  17 g Per Tube BID   QUEtiapine  50 mg Per Tube QHS   spironolactone  12.5 mg Per Tube BID   thiamine  100 mg Per Tube Daily    Continuous Infusions:  feeding supplement (OSMOLITE 1.5 CAL) 1,000 mL (12/11/22 2246)    PRN Medications:  acetaminophen **OR** acetaminophen, dextrose, lactulose, LORazepam, morphine injection, mouth rinse, mouth rinse, senna-docusate  Antimicrobials from admission:  Anti-infectives (From admission, onward)    Start     Dose/Rate Route Frequency Ordered Stop   12/08/22 1245  ceFAZolin (ANCEF) IVPB 1 g/50 mL premix        1 g 100 mL/hr over 30 Minutes Intravenous  Once 12/08/22 1154 12/08/22 1532   12/04/22 1530  fluconazole (DIFLUCAN) IVPB 100 mg        100 mg 50 mL/hr over 60 Minutes Intravenous Every 24 hours 12/04/22 1322 12/08/22 1715   11/30/22 1000  azithromycin (ZITHROMAX) 500 mg in sodium chloride 0.9 % 250 mL IVPB        500 mg 250 mL/hr over 60 Minutes Intravenous Every 24 hours 11/30/22 0827 12/04/22 1038   11/30/22 0930  cefTRIAXone (ROCEPHIN) 2 g in sodium chloride 0.9 % 100 mL IVPB        2 g 200 mL/hr over 30 Minutes Intravenous Every 24 hours 11/30/22 0827 12/04/22 8295           Data Reviewed:  I have personally reviewed the following...  CBC: Recent Labs  Lab 12/06/22 0438 12/08/22 0800 12/09/22 0455 12/10/22 0458 12/12/22 0619  WBC 8.8 8.2 9.3 10.5 10.2  NEUTROABS 5.8  --   --   --   --   HGB 11.6* 12.1* 11.6* 11.5* 11.8*  HCT 34.1* 35.1* 33.7* 33.7* 34.2*  MCV 94.7 93.4 93.9 93.4 92.7  PLT 339 348 318 313 299   Basic Metabolic Panel: Recent Labs  Lab 12/06/22 0438 12/08/22 0800 12/08/22 1628 12/09/22 0455 12/09/22 1649 12/10/22 0458 12/12/22 0619  NA 138 137  --  133*  --  131* 130*  K 3.6 3.6  --  3.5  --  3.8 4.1  CL 107 107  --  106  --  102 99  CO2 23 23  --  22  --  26 25  GLUCOSE 102* 96  --  108*  --  120* 113*  BUN 16  15  --  12  --  13 13  CREATININE 0.52* 0.57*  --  0.45*  --  0.49* 0.51*  CALCIUM 8.3* 8.2*  --  7.9*  --  7.6* 8.1*  MG  --   --  1.7 1.6* 1.7 1.7 1.8  PHOS  --   --  3.9 3.0 3.8 3.0  --    GFR: Estimated Creatinine Clearance: 96.9 mL/min (A) (by C-G formula based on SCr of 0.51 mg/dL (L)). Liver Function Tests: Recent Labs  Lab 12/08/22 0800 12/09/22 0455 12/12/22 0619  AST 58* 59* 52*  ALT 26 25 27   ALKPHOS 100 97 109  BILITOT 0.9 1.0 0.6  PROT 7.7 7.4 7.9  ALBUMIN 2.3* 2.1* 2.2*   No results for input(s): "LIPASE", "AMYLASE" in the last 168 hours. Recent Labs  Lab 12/09/22 0455 12/12/22 0619  AMMONIA 29 37*   Coagulation Profile: Recent Labs  Lab 12/07/22 1806  INR 1.5*   Cardiac Enzymes: No results for input(s): "CKTOTAL", "CKMB", "CKMBINDEX", "TROPONINI" in the last 168 hours. BNP (last 3 results) No results for input(s): "PROBNP" in the last 8760 hours. HbA1C: No results for input(s): "HGBA1C" in the last 72 hours. CBG: Recent Labs  Lab 12/11/22 2054 12/12/22 0001 12/12/22 0407 12/12/22 0843 12/12/22 1232  GLUCAP 116* 135* 120* 140* 130*   Lipid Profile: No results for input(s): "CHOL", "HDL", "LDLCALC", "TRIG", "CHOLHDL", "LDLDIRECT" in the last 72 hours. Thyroid Function Tests: No results for input(s): "TSH", "T4TOTAL", "FREET4", "T3FREE", "THYROIDAB" in the last 72 hours. Anemia Panel: No results for input(s): "VITAMINB12", "FOLATE", "FERRITIN", "TIBC", "IRON", "RETICCTPCT" in the last 72 hours. Most Recent Urinalysis On File:     Component Value Date/Time   COLORURINE AMBER (A) 11/22/2022 1941   APPEARANCEUR HAZY (A) 11/22/2022 1941   LABSPEC 1.027 11/22/2022 1941   PHURINE 5.0 11/22/2022 1941   GLUCOSEU NEGATIVE 11/22/2022 1941   HGBUR NEGATIVE 11/22/2022 1941   BILIRUBINUR NEGATIVE 11/22/2022 1941   KETONESUR NEGATIVE 11/22/2022 1941   PROTEINUR NEGATIVE 11/22/2022 1941   NITRITE NEGATIVE 11/22/2022 1941   LEUKOCYTESUR NEGATIVE  11/22/2022 1941   Sepsis Labs: @LABRCNTIP (procalcitonin:4,lacticidven:4) Microbiology: No results found for this or any previous visit (from the past 240 hour(s)).     Radiology Studies last 3 days: CT ABDOMEN PELVIS WO CONTRAST  Result Date: 12/09/2022 CLINICAL DATA:  Postoperative abdominal pain. Peg placement yesterday. Cirrhosis. EXAM: CT ABDOMEN AND PELVIS WITHOUT CONTRAST TECHNIQUE: Multidetector CT imaging of the abdomen and pelvis was performed following the standard protocol without IV contrast. RADIATION DOSE REDUCTION: This exam was performed according to the departmental dose-optimization program which includes automated exposure control, adjustment of the mA and/or kV according to patient size and/or use of iterative reconstruction technique. COMPARISON:  CT of the abdomen and pelvis 11/22/2022 and 11/09/2022. FINDINGS: Lower chest: Medial left basilar airspace disease likely reflects atelectasis. Mild atelectasis is present right. The heart size is normal. Hepatobiliary: Liver demonstrates a somewhat nodular appearance. No discrete lesions are present. Common bile duct is normal. Fluid surrounds the gallbladder contiguous with other perihepatic fluid Pancreas: A pancreatic pseudocyst the head of the pancreas is stable in size measuring 6.52 cm. Smaller cyst in the head of the pancreas not clearly visible. The cyst near the tail is stable in size at 3.2 cm. Focal mass lesion present. Spleen: Normal in size without focal abnormality. Adrenals/Urinary Tract: Adrenal glands are normal bilaterally. Exophytic lesions at the upper pole of the left kidney are stable. Stone or obstruction is present. The ureters are within normal limits bilaterally. The urinary bladder is. Stomach/Bowel: A large hiatal hernia is present. A PEG tube is in place. Contrast is contained within the  stomach and proximal duodenum. The stomach and duodenum are otherwise within. Small bowel is unremarkable. Ileum is within  normal limits. Appendix is not discretely visualized and may be surgically absent. Vascular/Lymphatic: Minimal atherosclerotic calcifications are present. No aneurysm is present. No significant retroperitoneal adenopathy is present. Subcentimeter nodes are present porta hepatis. Subcentimeter peripancreatic nodes are present stable. Reproductive: Prostate is unremarkable. Other: Diffuse abdominal ascites is slightly decreased prior CT. Extensive stranding in the mesentery noted. Multiple locules of nondependent free air consistent with recent PEG tube placement. Musculoskeletal: The vertebral heights and alignment are normal. Bony pelvis is normal. Hips are located and within normal limits. IMPRESSION: 1. PEG tube in place with multiple locules of nondependent free air consistent with recent PEG tube placement. No extravasation contrast from the stomach is present. 2. Diffuse abdominal ascites is slightly decreased. 3. Stable pancreatic pseudocysts. 4. Large hiatal hernia. 5. Stable exophytic lesions at the upper pole of the left kidney, likely cysts. Electronically Signed   By: Marin Roberts M.D.   On: 12/09/2022 13:35             LOS: 19 days       Sunnie Nielsen, DO [facility] 12/12/2022, 1:45 PM    Dictation software may have been used to generate the above note. Typos may occur and escape review in typed/dictated notes. Please contact Dr Lyn Hollingshead directly for clarity if needed.  Staff may message me via secure chat in Epic  but this may not receive an immediate response,  please page me for urgent matters!  If 7PM-7AM, please contact night coverage [email or web address]

## 2022-12-12 NOTE — Progress Notes (Signed)
Physical Therapy Treatment Patient Details Name: Luke Rogers. MRN: [id number] DOB: 22-Sep-1960 Today's Date: 12/12/2022   History of Present Illness Pt is a 62 y.o. male with medical history significant of alcohol abuse, GERD, hepatitis C, history of substance abuse, recurrent pancreatitis. To ED 11/22/22 w/ abd pain and bloating similar to previous pancreatitis episodes, has had several paracentesis this admission as well as NGT placed due to lethargy. S/p PEG tube on 12/08/22.    PT Comments    Patient with improved cognition this PM but still with some general confusion about current situation. Patient required modA for bed mobility and sit to stand with RW. Able to perform sit to stand x 5 with modA and frequent cues for hand placement prior to standing. Required modA to take side steps at EOB with RW and tremor noted with activity in standing. Assisted back to bed with bed alarm activated. Discharge plan remains appropriate at this time.     Recommendations for follow up therapy are one component of a multi-disciplinary discharge planning process, led by the attending physician.  Recommendations may be updated based on patient status, additional functional criteria and insurance authorization.  Follow Up Recommendations  Can patient physically be transported by private vehicle: No    Assistance Recommended at Discharge Frequent or constant Supervision/Assistance  Patient can return home with the following A lot of help with walking and/or transfers;A lot of help with bathing/dressing/bathroom;Assist for transportation;Assistance with cooking/housework;Assistance with feeding   Equipment Recommendations  Rolling Rohen Kimes (2 wheels)    Recommendations for Other Services       Precautions / Restrictions Precautions Precautions: Fall Precaution Comments: PEG tube Restrictions Weight Bearing Restrictions: No     Mobility  Bed Mobility Overal bed mobility: Needs  Assistance Bed Mobility: Supine to Sit, Sit to Supine     Supine to sit: Mod assist Sit to supine: Min assist   General bed mobility comments: step by step cues to complete bed mobility. ModA for trunk elevation and repositioning hips towards EOB. Assist required to bring LEs back into bed    Transfers Overall transfer level: Needs assistance Equipment used: Rolling Abrar Koone (2 wheels) Transfers: Sit to/from Stand Sit to Stand: Mod assist, From elevated surface           General transfer comment: modA to stand from slightly elevated bed surface. Able to complete STS x 5 repeatedly. Cues for hand placement each attempt.    Ambulation/Gait Ambulation/Gait assistance: Mod assist Gait Distance (Feet): 5 Feet Assistive device: Rolling Jackline Castilla (2 wheels) Gait Pattern/deviations: Step-to pattern Gait velocity: decreased     General Gait Details: lateral steps towards HOB with modA for balance and RW management. Tremors noted with mobility   Stairs             Wheelchair Mobility    Modified Rankin (Stroke Patients Only)       Balance Overall balance assessment: Needs assistance Sitting-balance support: Feet supported, Single extremity supported, Bilateral upper extremity supported Sitting balance-Leahy Scale: Fair     Standing balance support: During functional activity, Reliant on assistive device for balance, Bilateral upper extremity supported Standing balance-Leahy Scale: Poor                              Cognition Arousal/Alertness: Awake/alert Behavior During Therapy: Restless Overall Cognitive Status: Impaired/Different from baseline Area of Impairment: Orientation, Memory, Following commands, Safety/judgement, Awareness, Problem solving  Orientation Level: Disoriented to, Situation, Place   Memory: Decreased short-term memory Following Commands: Follows one step commands consistently, Follows multi-step commands  inconsistently Safety/Judgement: Decreased awareness of safety, Decreased awareness of deficits Awareness: Intellectual Problem Solving: Requires verbal cues, Requires tactile cues, Difficulty sequencing          Exercises General Exercises - Lower Extremity Hip Flexion/Marching: AROM, Both, 10 reps, Standing Other Exercises Other Exercises: sit to stand x 5    General Comments        Pertinent Vitals/Pain Pain Assessment Pain Assessment: Faces Faces Pain Scale: Hurts even more Pain Location: abdomen Pain Descriptors / Indicators: Grimacing, Discomfort Pain Intervention(s): Monitored during session, Limited activity within patient's tolerance    Home Living                          Prior Function            PT Goals (current goals can now be found in the care plan section) Acute Rehab PT Goals PT Goal Formulation: With patient/family Time For Goal Achievement: 12/23/22 Potential to Achieve Goals: Fair Progress towards PT goals: Progressing toward goals    Frequency    Min 3X/week      PT Plan Current plan remains appropriate    Co-evaluation              AM-PAC PT "6 Clicks" Mobility   Outcome Measure  Help needed turning from your back to your side while in a flat bed without using bedrails?: A Lot Help needed moving from lying on your back to sitting on the side of a flat bed without using bedrails?: A Lot Help needed moving to and from a bed to a chair (including a wheelchair)?: A Lot Help needed standing up from a chair using your arms (e.g., wheelchair or bedside chair)?: A Lot Help needed to walk in hospital room?: Total Help needed climbing 3-5 steps with a railing? : Total 6 Click Score: 10    End of Session   Activity Tolerance: Patient tolerated treatment well Patient left: in bed;with call bell/phone within reach;with bed alarm set Nurse Communication: Mobility status PT Visit Diagnosis: Unsteadiness on feet  (R26.81);Difficulty in walking, not elsewhere classified (R26.2);Muscle weakness (generalized) (M62.81)     Time: 1610-9604 PT Time Calculation (min) (ACUTE ONLY): 24 min  Charges:  $Therapeutic Exercise: 8-22 mins $Therapeutic Activity: 8-22 mins                     Maylon Peppers, PT, DPT Physical Therapist - [facility]  [facility]    Jlee Harkless A Lavaya Defreitas 12/12/2022, 3:20 PM

## 2022-12-13 DIAGNOSIS — E512 Wernicke's encephalopathy: Secondary | ICD-10-CM | POA: Diagnosis not present

## 2022-12-13 LAB — GLUCOSE, CAPILLARY
Glucose-Capillary: 114 mg/dL — ABNORMAL HIGH (ref 70–99)
Glucose-Capillary: 119 mg/dL — ABNORMAL HIGH (ref 70–99)
Glucose-Capillary: 129 mg/dL — ABNORMAL HIGH (ref 70–99)
Glucose-Capillary: 134 mg/dL — ABNORMAL HIGH (ref 70–99)
Glucose-Capillary: 160 mg/dL — ABNORMAL HIGH (ref 70–99)

## 2022-12-13 NOTE — Plan of Care (Signed)

## 2022-12-13 NOTE — TOC Progression Note (Signed)
Transition of Care (TOC) - Progression Note    Patient Details  Name: Luke Rogers. MRN: [id number] Date of Birth: 1961-07-18  Transition of Care [facility]) CM/SW Contact  Allena Katz, LCSW Phone Number: 12/13/2022, 12:05 PM  Clinical Narrative:   Referrals sent out for SNF.          Expected Discharge Plan and Services                                               Social Determinants of Health (SDOH) Interventions SDOH Screenings   Food Insecurity: No Food Insecurity (11/23/2022)  Housing: Low Risk  (11/23/2022)  Transportation Needs: No Transportation Needs (11/23/2022)  Utilities: Not At Risk (11/23/2022)  Tobacco Use: High Risk (12/11/2022)    Readmission Risk Interventions     No data to display

## 2022-12-13 NOTE — Progress Notes (Signed)
Occupational Therapy Treatment Patient Details Name: Luke Rogers. MRN: [id number] DOB: August 19, 1960 Today's Date: 12/13/2022   History of present illness Pt is a 62 y.o. male with medical history significant of alcohol abuse, GERD, hepatitis C, history of substance abuse, recurrent pancreatitis. To ED 11/22/22 w/ abd pain and bloating similar to previous pancreatitis episodes, has had several paracentesis this admission as well as NGT placed due to lethargy. S/p PEG tube on 12/08/22.   OT comments  Pt seen for skilled co-treatment with PT. OT creates environment with minimal distraction by closing door to room and turning off TV. Pt performs bed mobility with mod A to EOB and stands with +2 assistance. Pt ambulates with min - mod A 2 into bathroom for toileting task. RN advances and pulled forwards with pt following with assistance from second person perform balance and safety. Pt able to have BM while seated on toilet with total A for hygiene while pt maintains standing with RW with mod A. Pt returning to bed in same manner as above but with max multimodal cuing for sequencing and processing increasing as pt fatigues. Pt's significant other present in room and discussed how to continue to orient pt, create a calming environment, and decreased chances of overstimulation that may lead to restlessness or agitation. She verbalized understanding. Pt resting comfortably in bed with all needs within reach.    Recommendations for follow up therapy are one component of a multi-disciplinary discharge planning process, led by the attending physician.  Recommendations may be updated based on patient status, additional functional criteria and insurance authorization.    Assistance Recommended at Discharge Frequent or constant Supervision/Assistance  Patient can return home with the following  Two people to help with walking and/or transfers;A lot of help with bathing/dressing/bathroom;Assistance with  cooking/housework;Assist for transportation;Help with stairs or ramp for entrance;Assistance with feeding;Direct supervision/assist for medications management;Direct supervision/assist for financial management   Equipment Recommendations  BSC/3in1       Precautions / Restrictions Precautions Precautions: Fall Precaution Comments: PEG tube Restrictions Weight Bearing Restrictions: No       Mobility Bed Mobility Overal bed mobility: Needs Assistance Bed Mobility: Supine to Sit     Supine to sit: Mod assist Sit to supine: Min assist   General bed mobility comments: step by step cues to complete bed mobility. ModA for trunk elevation and repositioning hips towards EOB. Assist required to bring LEs back into bed    Transfers Overall transfer level: Needs assistance Equipment used: Rolling walker (2 wheels) Transfers: Sit to/from Stand Sit to Stand: Min assist, +2 physical assistance, +2 safety/equipment, Mod assist                 Balance Overall balance assessment: Needs assistance Sitting-balance support: Feet supported, Single extremity supported, Bilateral upper extremity supported Sitting balance-Leahy Scale: Fair Sitting balance - Comments: supervision-minA while seated EOB combing hair   Standing balance support: During functional activity, Reliant on assistive device for balance, Bilateral upper extremity supported Standing balance-Leahy Scale: Poor                             ADL either performed or assessed with clinical judgement   ADL Overall ADL's : Needs assistance/impaired                         Toilet Transfer: +2 for physical assistance;Moderate assistance;Rolling walker (2 wheels);Ambulation;Minimal assistance   Toileting-  Clothing Manipulation and Hygiene: Sit to/from stand;Maximal assistance              Extremity/Trunk Assessment Upper Extremity Assessment Upper Extremity Assessment: Generalized weakness   Lower  Extremity Assessment Lower Extremity Assessment: Generalized weakness        Vision Patient Visual Report: No change from baseline            Cognition Arousal/Alertness: Awake/alert Behavior During Therapy: Flat affect Overall Cognitive Status: Impaired/Different from baseline Area of Impairment: Orientation, Memory, Following commands, Safety/judgement, Awareness, Problem solving                 Orientation Level: Disoriented to, Situation   Memory: Decreased short-term memory Following Commands: Follows one step commands consistently, Follows multi-step commands inconsistently Safety/Judgement: Decreased awareness of safety, Decreased awareness of deficits Awareness: Intellectual Problem Solving: Requires verbal cues, Requires tactile cues, Difficulty sequencing General Comments: general confusion noted throughout session                   Pertinent Vitals/ Pain       Pain Assessment Pain Assessment: Faces Faces Pain Scale: Hurts little more Pain Location: abdomen Pain Descriptors / Indicators: Grimacing, Discomfort Pain Intervention(s): Monitored during session, Repositioned  Home Living Family/patient expects to be discharged to:: Private residence Living Arrangements: Spouse/significant other;Parent                                          Frequency  Min 3X/week        Progress Toward Goals  OT Goals(current goals can now be found in the care plan section)  Progress towards OT goals: Progressing toward goals     Plan Discharge plan remains appropriate;Frequency remains appropriate    Co-evaluation    PT/OT/SLP Co-Evaluation/Treatment: Yes Reason for Co-Treatment: For patient/therapist safety;Necessary to address cognition/behavior during functional activity;Complexity of the patient's impairments (multi-system involvement) PT goals addressed during session: Mobility/safety with mobility;Balance;Proper use of DME OT goals  addressed during session: ADL's and self-care      AM-PAC OT "6 Clicks" Daily Activity     Outcome Measure   Help from another person eating meals?: Total Help from another person taking care of personal grooming?: A Little Help from another person toileting, which includes using toliet, bedpan, or urinal?: Total Help from another person bathing (including washing, rinsing, drying)?: A Lot Help from another person to put on and taking off regular upper body clothing?: A Lot Help from another person to put on and taking off regular lower body clothing?: Total 6 Click Score: 10    End of Session Equipment Utilized During Treatment: Rolling walker (2 wheels)  OT Visit Diagnosis: Other abnormalities of gait and mobility (R26.89);Muscle weakness (generalized) (M62.81)   Activity Tolerance Patient tolerated treatment well;Patient limited by fatigue   Patient Left in bed;with call bell/phone within reach;with bed alarm set;with family/visitor present   Nurse Communication Mobility status        Time: 5621-3086 OT Time Calculation (min): 27 min  Charges: OT General Charges $OT Visit: 1 Visit OT Treatments $Self Care/Home Management : 8-22 mins  Jackquline Denmark, MS, OTR/L , CBIS ascom [phone]  12/13/22, 2:18 PM

## 2022-12-13 NOTE — NC FL2 (Cosign Needed Addendum)
[place] MEDICAID FL2 LEVEL OF CARE FORM     IDENTIFICATION  Patient Name: Luke Rogers. Birthdate: [birth year] Sex: male Admission Date (Current Location): 11/23/2022  [place] [facility] and [place] Number:  Chiropodist and Address:  [facility], [address] [place], [place] [address]      Provider Number: [id number]  Attending Physician Name and Address:  Loyce Dys, MD  Relative Name and Phone Number:  Augie, [place] (Mother) [phone]    Current Level of Care: Hospital Recommended Level of Care: Skilled Nursing Facility Prior Approval Number:    Date Approved/Denied: 12/13/22 PASRR Number: [id number]  Discharge Plan: SNF    Current Diagnoses: Patient Active Problem List   Diagnosis Date Noted   Protein-calorie malnutrition, severe 12/06/2022   Alcoholic pancreatitis 11/23/2022   Non-recurrent bilateral inguinal hernia without obstruction or gangrene 09/19/2022   Recurrent acute pancreatitis 07/17/2022   Pancreatic mass 07/17/2022   Left renal mass 07/17/2022   Hypomagnesemia 12/22/2021   GERD (gastroesophageal reflux disease) 12/22/2021   Visual hallucination    Accidental drug overdose    Hyponatremia 12/18/2021   Metabolic acidosis 12/18/2021   Hyperbilirubinemia 12/18/2021   Visual hallucinations 12/18/2021   Amphetamine intoxication (HCC) 12/18/2021   Amphetamine adverse reaction 12/18/2021   Alcohol abuse    Elevated LFTs    Thrombocytopenia (HCC)    Tobacco abuse    Acute alcoholic pancreatitis 01/17/2021   Nicotine dependence 01/17/2021   Transaminitis 01/17/2021   Malnutrition of moderate degree 01/09/2018   Acute purulent meningitis 01/08/2018   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    Orientation RESPIRATION BLADDER Height & Weight     Self, Place  Normal Incontinent, External catheter Weight: 148 lb 2.4 oz (67.2 kg) Height:  6\' 2"  (188 cm)  BEHAVIORAL SYMPTOMS/MOOD NEUROLOGICAL BOWEL  NUTRITION STATUS      Incontinent Diet Peg  AMBULATORY STATUS COMMUNICATION OF NEEDS Skin   Limited Assist Verbally  (Foam dressing to R heel, scrotal petechiae, closed incision to left abdomen Gauze daily)                       Personal Care Assistance Level of Assistance  Bathing, Feeding, Dressing Bathing Assistance: Maximum assistance Feeding assistance: Limited assistance Dressing Assistance: Limited assistance     Functional Limitations Info  Sight, Hearing, Speech Sight Info: Impaired Hearing Info: Adequate Speech Info: Adequate    SPECIAL CARE FACTORS FREQUENCY  PT (By licensed PT), OT (By licensed OT)     PT Frequency: 5 times a week OT Frequency: 5 times a week            Contractures Contractures Info: Not present    Additional Factors Info  Code Status, Allergies Code Status Info: FULL Allergies Info: Tramadol           Current Medications (12/13/2022):  This is the current hospital active medication list Current Facility-Administered Medications  Medication Dose Route Frequency Provider Last Rate Last Admin   acetaminophen (TYLENOL) tablet 650 mg  650 mg Per Tube Q6H PRN Tressie Ellis, RPH   650 mg at 12/04/22 2156   Or   acetaminophen (TYLENOL) suppository 650 mg  650 mg Rectal Q6H PRN Tressie Ellis, RPH       calcium carbonate (OS-CAL - dosed in mg of elemental calcium) tablet 2,500 mg  1,000 mg of elemental calcium Per Tube Q0600 Cordella Register A, RPH   2,500 mg at 12/13/22 0503   dextrose  50 % solution 50 mL  1 ampule Intravenous Once PRN Sunnie Nielsen, DO       enoxaparin (LOVENOX) injection 40 mg  40 mg Subcutaneous Q24H Sunnie Nielsen, DO   40 mg at 12/12/22 2043   famotidine (PEPCID) tablet 20 mg  20 mg Per Tube Daily Drusilla Kanner, RPH   20 mg at 12/13/22 0951   feeding supplement (OSMOLITE 1.5 CAL) liquid 1,000 mL  1,000 mL Per Tube Continuous Sunnie Nielsen, DO 65 mL/hr at 12/13/22 0802 1,000 mL at 12/13/22 0802    feeding supplement (PROSource TF20) liquid 60 mL  60 mL Per Tube Daily Sunnie Nielsen, DO   60 mL at 12/13/22 9811   folic acid (FOLVITE) tablet 1 mg  1 mg Per Tube Daily Sunnie Nielsen, DO   1 mg at 12/13/22 9147   free water 140 mL  140 mL Per Tube Q4H Sunnie Nielsen, DO   140 mL at 12/13/22 0801   furosemide (LASIX) tablet 20 mg  20 mg Per Tube Daily Sunnie Nielsen, DO   20 mg at 12/13/22 0951   lactulose (CHRONULAC) 10 GM/15ML solution 20 g  20 g Oral BID PRN Sunnie Nielsen, DO   20 g at 12/12/22 1011   LORazepam (ATIVAN) injection 1 mg  1 mg Intramuscular Q4H PRN Sunnie Nielsen, DO   1 mg at 12/13/22 8295   morphine (PF) 2 MG/ML injection 2 mg  2 mg Intravenous Q2H PRN Sunnie Nielsen, DO   2 mg at 12/13/22 0013   multivitamin with minerals tablet 1 tablet  1 tablet Per Tube Daily Sunnie Nielsen, DO   1 tablet at 12/13/22 6213   neomycin-bacitracin-polymyxin [phone] OINT 1 Application  1 Application Topical Daily Stanton Kidney, MD   1 Application at 12/13/22 0865   nicotine (NICODERM CQ - dosed in mg/24 hours) patch 21 mg  21 mg Transdermal Daily Zierle-Ghosh, Asia B, DO   21 mg at 12/13/22 7846   Oral care mouth rinse  15 mL Mouth Rinse PRN Sunnie Nielsen, DO       Oral care mouth rinse  15 mL Mouth Rinse 4 times per day Loyce Dys, MD   15 mL at 12/13/22 0801   Oral care mouth rinse  15 mL Mouth Rinse PRN Loyce Dys, MD       oxyCODONE (Oxy IR/ROXICODONE) immediate release tablet 10 mg  10 mg Per Tube Q6H Sunnie Nielsen, DO   10 mg at 12/13/22 0951   pneumococcal 20-valent conjugate vaccine (PREVNAR 20) injection 0.5 mL  0.5 mL Intramuscular Tomorrow-1000 Sunnie Nielsen, DO       polyethylene glycol (MIRALAX / GLYCOLAX) packet 17 g  17 g Per Tube BID Sunnie Nielsen, DO   17 g at 12/12/22 2043   QUEtiapine (SEROQUEL) tablet 50 mg  50 mg Per Tube QHS Sunnie Nielsen, DO   50 mg at 12/12/22 2043   senna-docusate (Senokot-S)  tablet 2 tablet  2 tablet Per Tube QHS PRN Ronnald Ramp, RPH   2 tablet at 12/11/22 2141   spironolactone (ALDACTONE) tablet 12.5 mg  12.5 mg Per Tube BID Tressie Ellis, RPH   12.5 mg at 12/13/22 9629   thiamine (VITAMIN B1) tablet 100 mg  100 mg Per Tube Daily Sunnie Nielsen, DO   100 mg at 12/13/22 5284     Discharge Medications: Please see discharge summary for a list of discharge medications.  Relevant Imaging Results:  Relevant Lab Results:   Additional  Information SS [id number]  Allena Katz, LCSW

## 2022-12-13 NOTE — Progress Notes (Signed)
   12/13/22 1100  Spiritual Encounters  Type of Visit Initial  Care provided to: Pt and family  Referral source Other (comment)  OnCall Visit Yes   Chaplain visited room looking for visitors who could witness signing of AD. Chaplain began conversation with patients visitor and provided compassionate presence through conversation and empathic listening.

## 2022-12-13 NOTE — Progress Notes (Signed)
Physical Therapy Treatment Patient Details Name: Luke Rogers. MRN: [id number] DOB: 03/05/1961 Today's Date: 12/13/2022   History of Present Illness Pt is a 62 y.o. male with medical history significant of alcohol abuse, GERD, hepatitis C, history of substance abuse, recurrent pancreatitis. To ED 11/22/22 w/ abd pain and bloating similar to previous pancreatitis episodes, has had several paracentesis this admission as well as NGT placed due to lethargy. S/p PEG tube on 12/08/22.    PT Comments    Patient with confusion this date and oriented to self mostly. Agreeable to PT/OT treatment session. Seen in conjunction with OT for gait progression and maximize activity tolerance. Required minA+2 to stand from EOB and min-modA+2 to ambulate to/from bathroom with assistance to push RW throughout. Significant other in room and supportive. Patient continues to fluctuate on performance due to cognition. Discharge plan remains appropriate.     Recommendations for follow up therapy are one component of a multi-disciplinary discharge planning process, led by the attending physician.  Recommendations may be updated based on patient status, additional functional criteria and insurance authorization.  Follow Up Recommendations  Can patient physically be transported by private vehicle: No    Assistance Recommended at Discharge Frequent or constant Supervision/Assistance  Patient can return home with the following A lot of help with walking and/or transfers;A lot of help with bathing/dressing/bathroom;Assist for transportation;Assistance with cooking/housework;Assistance with feeding   Equipment Recommendations  Rolling Nastasha Reising (2 wheels)    Recommendations for Other Services       Precautions / Restrictions Precautions Precautions: Fall Precaution Comments: PEG tube Restrictions Weight Bearing Restrictions: No     Mobility  Bed Mobility Overal bed mobility: Needs Assistance Bed Mobility:  Supine to Sit     Supine to sit: Mod assist          Transfers Overall transfer level: Needs assistance Equipment used: Rolling Niccole Witthuhn (2 wheels) Transfers: Sit to/from Stand Sit to Stand: Min assist, +2 physical assistance, +2 safety/equipment, Mod assist           General transfer comment: from EOB, minA+2 to come into standing. from low toilet surface required modA+2    Ambulation/Gait Ambulation/Gait assistance: Min assist, Mod assist, +2 physical assistance, +2 safety/equipment Gait Distance (Feet): 12 Feet (+12') Assistive device: Rolling Keela Rubert (2 wheels) Gait Pattern/deviations: Step-to pattern, Trunk flexed Gait velocity: decreased     General Gait Details: required frequent cueing for steps. Assistance required for balance and pushing RW throughout.   Stairs             Wheelchair Mobility    Modified Rankin (Stroke Patients Only)       Balance Overall balance assessment: Needs assistance Sitting-balance support: Feet supported, Single extremity supported, Bilateral upper extremity supported Sitting balance-Leahy Scale: Fair     Standing balance support: During functional activity, Reliant on assistive device for balance, Bilateral upper extremity supported Standing balance-Leahy Scale: Poor                              Cognition Arousal/Alertness: Awake/alert Behavior During Therapy: Flat affect Overall Cognitive Status: Impaired/Different from baseline Area of Impairment: Orientation, Memory, Following commands, Safety/judgement, Awareness, Problem solving                 Orientation Level: Disoriented to, Situation   Memory: Decreased short-term memory Following Commands: Follows one step commands consistently, Follows multi-step commands inconsistently Safety/Judgement: Decreased awareness of safety, Decreased awareness of deficits  Awareness: Intellectual Problem Solving: Requires verbal cues, Requires tactile  cues, Difficulty sequencing          Exercises      General Comments        Pertinent Vitals/Pain Pain Assessment Pain Assessment: Faces Faces Pain Scale: Hurts little more Pain Location: abdomen Pain Descriptors / Indicators: Grimacing, Discomfort Pain Intervention(s): Monitored during session, Repositioned    Home Living                          Prior Function            PT Goals (current goals can now be found in the care plan section) Acute Rehab PT Goals PT Goal Formulation: With patient/family Time For Goal Achievement: 12/23/22 Potential to Achieve Goals: Fair Progress towards PT goals: Progressing toward goals    Frequency    Min 4X/week      PT Plan Current plan remains appropriate    Co-evaluation PT/OT/SLP Co-Evaluation/Treatment: Yes Reason for Co-Treatment: For patient/therapist safety;Necessary to address cognition/behavior during functional activity;Complexity of the patient's impairments (multi-system involvement) PT goals addressed during session: Mobility/safety with mobility;Balance;Proper use of DME        AM-PAC PT "6 Clicks" Mobility   Outcome Measure  Help needed turning from your back to your side while in a flat bed without using bedrails?: A Lot Help needed moving from lying on your back to sitting on the side of a flat bed without using bedrails?: A Lot Help needed moving to and from a bed to a chair (including a wheelchair)?: A Lot Help needed standing up from a chair using your arms (e.g., wheelchair or bedside chair)?: A Lot Help needed to walk in hospital room?: Total Help needed climbing 3-5 steps with a railing? : Total 6 Click Score: 10    End of Session   Activity Tolerance: Patient tolerated treatment well Patient left: in bed;with call bell/phone within reach;with bed alarm set Nurse Communication: Mobility status PT Visit Diagnosis: Unsteadiness on feet (R26.81);Difficulty in walking, not elsewhere  classified (R26.2);Muscle weakness (generalized) (M62.81)     Time: 2956-2130 PT Time Calculation (min) (ACUTE ONLY): 21 min  Charges:  $Gait Training: 8-22 mins                     Maylon Peppers, PT, DPT Physical Therapist - [facility]  [facility]   Janani Chamber A Breda Bond 12/13/2022, 1:36 PM

## 2022-12-13 NOTE — Progress Notes (Signed)
Patient is confused, only oriented to self.  Pulled off condom catheter, this RN performed peri care and replaced condom catheter.  No restraints or mittens on at this time.

## 2022-12-13 NOTE — Progress Notes (Signed)
Progress Note   Patient: Luke Rogers. ZOX:[id number] DOB: [birth year] DOA: 11/23/2022     20 DOS: the patient was seen and examined on 12/13/2022    Subjective  Mental status continues to improve Patient working with PT OT at the time of rounds Denies any acute overnight events Denies nausea vomiting chest pain or cough Fianc tells me his mental status is a little down today compared to yesterday   Brief Narrative / Hospital Course:  Luke Bartow. is a 62 y.o. male with medical history significant of alcohol abuse, GERD, hepatitis C, history of substance abuse, recurrent pancreatitis. To ED 11/22/22 w/ abd pain and bloating similar to previous pancreatitis episodes (last one was 05/2022). Typical EtOH use 3 tallboys beer per day, recently cut down to 2. (+)intermittent tremor.  05/02: admitted to hospitalist service for treatment pancreatitis. Pt is motivated to quit alcohol and treat withdrawals.  05/03: pain/lipase improving. Paracentesis removed 4L. No apparent SBP.  05/04: CIWA up. Minimal UOP, more confused/agitated, placed Foley. Transferred to stepdown for closer monitoring on phenobarbital.  05/05-05/06: remains in SDU on phenobarbital taper protocol.  05/07: quite agitated, requiring ativan + phenobarb but CIWA scores better on this. Paracentesis repeated removing 2L  05/08: lethargic, not taking po, NG placed.  05/09: CXR right interstitial lung opacities, concern for pneumonia/aspiration, requiring 5L O2 [place], ceftriaxone and azithro  05/10-05/12: more alert, but SLP still unable to work w/ him  05/13: SLP reeval - severe aspiration risk, continue NPO. Neurology consulted. MRI brain and EEG normal. Palliative consulted.  05/14: per neurology, anticipate will improve but this may be slow and may need prolonged nursing care.  05/15: still lethargic/confused. Overnight coughed up mass after he pulled NG tube, sent for pathology, NG unable to be replaced. D/w GI for  PEG 05/16:  Family meeting today, see IPAL note.  12/08/22 (Fri): PEG tube placed. 05/18: confused but abd tender. CT abd/pelv - no acute findings or complications from recent procedure. PT/OT recs for SNF/STR.  05/19-05/20: placement pending which may be a challenge given significant intermittent confusion. Has not needed sitter but has needed mitts.  05/21: significant cognitive improvement today, carrying on coherent conversation, oriented to year, place, person.      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  Has completed high dose of thiamine course   Alcoholic cirrhosis w/ ascites S/p Paracentesis x2 Low PMN, no apparent SBP MELD 12 = 6% 33-month mortality  Child-Pugh Child Class B = Indication for transplant evaluation Will need GI and portal HTN clinic w/ IR follow up outpatient  furosemide and spironolactone titrate as tolerated  EtOH abstinence encouraged  Repeat paracentesis as needed    Encephalopathy related dysphagia  Has pulled NG tube and this was unable to be  re-placed Expect may need long-term tube feeds  Aspiration precautions  PEG tube for feeds and meds SLP following   Acute hypoxic respiratory failure  secondary to community-acquired pneumonia - resolved Abx have been d/c O2 supplementation as needed - has not been necessary    Delirium -  improved Ammonia was very slightly above WNL, but trended back down Given constipation, will give lactulose as needed Consider scheduled lactulose, will trend ammonia --> has been WNL Intermittently follow ammonium  Seroquel qhs   Pancreatic pseudocyst Repeat imaging no concerns    Hypocalcemia Replacing Monitor    Hyponatremia Expect will be chronic given EtOH related liver disease  Monitor BMP   Hypomagnesemia  Replacing  Monitor   Alcohol abuse/dependence counseled on importance of cessation when he was lucid CIWA protocol completed Continue to monitor - per fiancee he is still asking for alcohol when delirious    GERD (gastroesophageal reflux disease) Hiatal hernia  PPI    Tobacco abuse Patient is not ready to quit (asked when lucid) Nicotine patch Counseled on potential complications of smoking extensively, counseled on importance of cessation   Constipation Bowel regimen / as needed   Alcoholic pancreatitis with lipase nearly 1200 on admission  resolved   Abdominal pain Concern for complication from PEG placement, possible SBP/ascites, other. CT showed no concerns. Pain appears resolved/intermittenr.  Monitor  Pain control    Expectoration of questionable lung/gastric mass 05/15 overnight  See photo in media Coughed this up after pulling NG tube Sent for pathology --> mucoid tissue w/ focal reactive squamous epithelium, no ciliated respiratory epithelium, likely upper aerodigestive tract origin, no malignancy      DVT prophylaxis: lovenox   Pertinent IV fluids/nutrition: tube feeds    Code Status: FULL CODE   Current Admission Status: inpatient   TOC needs / Dispo plan: need SNF/STR   Barriers to discharge / significant pending items: placement        Family Communication: [name] at bedside on  rounds               Physical Exam Constitutional:      General: He is not in acute distress.    Appearance: He is not ill-appearing.  Cardiovascular:     Rate and Rhythm: Normal rate and regular rhythm.     Heart sounds: Normal heart sounds.  Pulmonary:     Breath sounds: Normal breath sounds.  Abdominal:     General: Abdomen is flat. Bowel sounds are normal.     Palpations: Abdomen is soft.  Tenderness noted in the right upper quadrant there is no guarding or rebound.  Skin:    General: Skin is warm and dry.  Neurological:     General: No focal deficit present.     Mental Status: He is alert.  Psychiatric:        Mood and Affect: Mood is not anxious.        Behavior: Behavior normal.      Data Reviewed:I have personally reviewed patient's laboratory results today showing sodium 130 potassium 4.1 creatinine 0.5  Time spent: 45 minutes  Vitals:   12/12/22 2010 12/13/22 0442 12/13/22 0500 12/13/22 0752  BP: 104/69 131/72  105/81  Pulse: 93 (!) 105  (!) 102  Resp: 16 16  17   Temp: 99.6 F (37.6 C) 98.5 F (36.9 C)  99.6 F (37.6 C)  TempSrc:      SpO2: 96% 97%  97%  Weight:   67.2 kg  Height:         Author: Loyce Dys, MD 12/13/2022 1:25 PM  For on call review [email or web address].

## 2022-12-14 ENCOUNTER — Inpatient Hospital Stay: Payer: Medicaid Other

## 2022-12-14 DIAGNOSIS — E512 Wernicke's encephalopathy: Secondary | ICD-10-CM | POA: Diagnosis not present

## 2022-12-14 LAB — CBC WITH DIFFERENTIAL/PLATELET
Abs Immature Granulocytes: 0.02 10*3/uL (ref 0.00–0.07)
Basophils Absolute: 0.1 10*3/uL (ref 0.0–0.1)
Basophils Relative: 1 %
Eosinophils Absolute: 0.3 10*3/uL (ref 0.0–0.5)
Eosinophils Relative: 3 %
HCT: 33.5 % — ABNORMAL LOW (ref 39.0–52.0)
Hemoglobin: 11.6 g/dL — ABNORMAL LOW (ref 13.0–17.0)
Immature Granulocytes: 0 %
Lymphocytes Relative: 19 %
Lymphs Abs: 1.7 10*3/uL (ref 0.7–4.0)
MCH: 31.8 pg (ref 26.0–34.0)
MCHC: 34.6 g/dL (ref 30.0–36.0)
MCV: 91.8 fL (ref 80.0–100.0)
Monocytes Absolute: 0.8 10*3/uL (ref 0.1–1.0)
Monocytes Relative: 9 %
Neutro Abs: 6.2 10*3/uL (ref 1.7–7.7)
Neutrophils Relative %: 68 %
Platelets: 251 10*3/uL (ref 150–400)
RBC: 3.65 MIL/uL — ABNORMAL LOW (ref 4.22–5.81)
RDW: 13.2 % (ref 11.5–15.5)
WBC: 9.1 10*3/uL (ref 4.0–10.5)
nRBC: 0 % (ref 0.0–0.2)

## 2022-12-14 LAB — BASIC METABOLIC PANEL
Anion gap: 7 (ref 5–15)
BUN: 14 mg/dL (ref 6–20)
CO2: 27 mmol/L (ref 22–32)
Calcium: 8.1 mg/dL — ABNORMAL LOW (ref 8.9–10.3)
Chloride: 97 mmol/L — ABNORMAL LOW (ref 98–111)
Creatinine, Ser: 0.51 mg/dL — ABNORMAL LOW (ref 0.61–1.24)
GFR, Estimated: >60 mL/min (ref >60)
Glucose, Bld: 124 mg/dL — ABNORMAL HIGH (ref 70–99)
Potassium: 4.2 mmol/L (ref 3.5–5.1)
Sodium: 131 mmol/L — ABNORMAL LOW (ref 135–145)

## 2022-12-14 LAB — GLUCOSE, CAPILLARY
Glucose-Capillary: 118 mg/dL — ABNORMAL HIGH (ref 70–99)
Glucose-Capillary: 126 mg/dL — ABNORMAL HIGH (ref 70–99)
Glucose-Capillary: 127 mg/dL — ABNORMAL HIGH (ref 70–99)
Glucose-Capillary: 127 mg/dL — ABNORMAL HIGH (ref 70–99)
Glucose-Capillary: 129 mg/dL — ABNORMAL HIGH (ref 70–99)
Glucose-Capillary: 141 mg/dL — ABNORMAL HIGH (ref 70–99)
Glucose-Capillary: 146 mg/dL — ABNORMAL HIGH (ref 70–99)

## 2022-12-14 MED ORDER — PANTOPRAZOLE SODIUM 40 MG IV SOLR
40.0000 mg | Freq: Every day | INTRAVENOUS | Status: DC
  Administered 2022-12-14 – 2022-12-19 (×6): 40 mg via INTRAVENOUS
  Filled 2022-12-14 (×6): qty 10

## 2022-12-14 MED ORDER — LIDOCAINE 5 % EX PTCH
1.0000 | MEDICATED_PATCH | CUTANEOUS | Status: DC
  Administered 2022-12-14 – 2022-12-18 (×3): 1 via TRANSDERMAL
  Filled 2022-12-14 (×6): qty 1

## 2022-12-14 MED ORDER — PANTOPRAZOLE SODIUM 40 MG PO TBEC: 40.0000 mg | DELAYED_RELEASE_TABLET | Freq: Every day | ORAL | Status: DC

## 2022-12-14 NOTE — Progress Notes (Signed)
Patient c/o "severe" right shoulder pain and heart burn. VSS. Denies any chest discomfort or difficulty breathing. MD made aware and new orders obtained.

## 2022-12-14 NOTE — Plan of Care (Signed)

## 2022-12-14 NOTE — Progress Notes (Signed)
Progress Note   Patient: Luke Rogers. WGN:[id number] DOB: 02/03/1961 DOA: 11/23/2022     21 DOS: the patient was seen and examined on 12/14/2022   Subjective  Mental status continues to improve He is much more awake today and able to engage in conversation Denies any acute overnight events Denies nausea vomiting chest pain or cough   Brief Narrative / Hospital Course:  Sundiata Cucchi. is a 62 y.o. male with medical history significant of alcohol abuse, GERD, hepatitis C, history of substance abuse, recurrent pancreatitis. To ED 11/22/22 w/ abd pain and bloating similar to previous pancreatitis episodes (last one was 05/2022). Typical EtOH use 3 tallboys beer per day, recently cut down to 2. (+)intermittent tremor.  05/02: admitted to hospitalist service for treatment pancreatitis. Pt is motivated to quit alcohol and treat withdrawals.  05/03: pain/lipase improving. Paracentesis removed 4L. No apparent SBP.  05/04: CIWA up. Minimal UOP, more confused/agitated, placed Foley. Transferred to stepdown for closer monitoring on phenobarbital.  05/05-05/06: remains in SDU on phenobarbital taper protocol.  05/07: quite agitated, requiring ativan + phenobarb but CIWA scores better on this. Paracentesis repeated removing 2L  05/08: lethargic, not taking po, NG placed.  05/09: CXR right interstitial lung opacities, concern for pneumonia/aspiration, requiring 5L O2 [place], ceftriaxone and azithro  05/10-05/12: more alert, but SLP still unable to work w/ him  05/13: SLP reeval - severe aspiration risk, continue NPO. Neurology consulted. MRI brain and EEG normal. Palliative consulted.  05/14: per neurology, anticipate will improve but this may be slow and may need prolonged nursing care.  05/15: still lethargic/confused. Overnight coughed up mass after he pulled NG tube, sent for pathology, NG unable to be replaced. D/w GI for PEG 05/16:  Family meeting today, see IPAL note.  12/08/22 (Fri):  PEG tube placed. 05/18: confused but abd tender. CT abd/pelv - no acute findings or complications from recent procedure. PT/OT recs for SNF/STR.  05/19-05/20: placement pending which may be a challenge given significant intermittent confusion. Has not needed sitter but has needed mitts.  05/21: significant cognitive improvement today, carrying on coherent conversation, oriented to year, place, person.      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  Has completed high dose of thiamine course   Alcoholic cirrhosis w/ ascites S/p Paracentesis x2 Low PMN, no apparent SBP MELD 12 = 6% 61-month mortality  Child-Pugh Child Class B = Indication for transplant evaluation Will need GI and portal HTN clinic w/ IR follow up outpatient  furosemide and spironolactone titrate as tolerated  EtOH abstinence encouraged  Repeat paracentesis as needed    Encephalopathy related dysphagia  Has pulled NG tube and this was unable to be re-placed Expect may need long-term tube feeds  Aspiration precautions  PEG  tube for feeds and meds SLP following   Acute hypoxic respiratory failure secondary to community-acquired pneumonia - resolved Abx have been  d/c O2 supplementation as needed - has not been necessary    Delirium -  improved Ammonia was very slightly above WNL, but trended back down Given constipation, will give lactulose as needed Consider scheduled lactulose, will trend ammonia --> has been WNL Intermittently follow ammonium  Seroquel qhs   Pancreatic pseudocyst Repeat imaging no concerns    Hypocalcemia Replacing Monitor    Hyponatremia Expect will be chronic given EtOH related liver disease  Monitor BMP   Hypomagnesemia  Replacing  Monitor   Alcohol abuse/dependence counseled on importance of cessation when he was lucid CIWA protocol completed Continue to monitor - per fiancee he is still asking for alcohol when delirious    GERD (gastroesophageal reflux disease) Hiatal hernia  PPI    Tobacco abuse Patient is not ready to quit (asked when lucid) Nicotine patch Counseled on potential complications of smoking extensively, counseled on importance of cessation   Constipation Bowel regimen / as needed   Alcoholic pancreatitis with lipase nearly 1200 on admission  resolved   Abdominal pain Concern for complication from PEG placement, possible SBP/ascites, other. CT showed no concerns. Pain appears resolved/intermittenr.  Monitor  Pain control    Expectoration of questionable lung/gastric mass 05/15 overnight  See photo in media Coughed this up after pulling NG tube Sent for pathology --> mucoid tissue w/ focal reactive squamous epithelium, no ciliated respiratory epithelium, likely upper aerodigestive tract origin, no malignancy      DVT prophylaxis: lovenox    Pertinent IV fluids/nutrition: tube feeds    Code Status: FULL CODE   Current Admission Status: inpatient    TOC needs / Dispo plan: need SNF/STR    Barriers to discharge / significant pending items: placement        Family Communication: [name] at bedside on rounds       Physical Exam Constitutional:      General: He  is not in acute distress.    Appearance: He is not ill-appearing.  Cardiovascular:     Rate and Rhythm: Normal rate and regular rhythm.     Heart sounds: Normal heart sounds.  Pulmonary:     Breath sounds: Normal breath sounds.  Abdominal:     General: Abdomen is flat. Bowel sounds are normal.     Palpations: Abdomen is soft.  Tenderness noted in the right upper quadrant there is no guarding or rebound.  Skin:    General: Skin is warm and dry.  Neurological:     General: No focal deficit present.     Mental Status: He is alert.  Psychiatric:        Mood and Affect: Mood is not anxious.        Behavior: Behavior normal.        Data Reviewed:I have personally reviewed patient's laboratory results today showing sodium 130 potassium 4.1 creatinine 0.5   Time spent: 42 minutes    Vitals:   12/13/22 2041 12/14/22 0339 12/14/22 0741 12/14/22 1145  BP: 107/64  112/72 111/80  Pulse: 89  90 95  Resp: 18  18 18   Temp: 98.6 F (37 C)  98 F (36.7 C) 99.2 F (37.3 C)  TempSrc: Oral  Oral Oral  SpO2: 97%  97% 97%  Weight:  66.3 kg    Height:         Author: Loyce Dys, MD  12/14/2022 12:49 PM  For on call review [email or web address].

## 2022-12-14 NOTE — Progress Notes (Signed)
Nutrition Follow-up  DOCUMENTATION CODES:   Severe malnutrition in context of acute illness/injury  INTERVENTION:   -Continue TF via PEG:    Osmolite 1.5 @ 65 ml/hr   60 ml Prosource TF daily    140 ml free water flush every 4 hours   Tube feeding regimen provides 2420 kcal (100% of needs), 118 grams of protein, and 1189 ml of H2O. Total free water: 2029 ml daily   -Continue MVI, thiamine and folic acid daily via tube    -Continue daily weights   NUTRITION DIAGNOSIS:   Severe Malnutrition related to acute illness as evidenced by severe muscle depletion, severe fat depletion.  Ongoing  GOAL:   Patient will meet greater than or equal to 90% of their needs  Met with TF  MONITOR:   Diet advancement, Labs, Weight trends, TF tolerance, Skin, I & O's  REASON FOR ASSESSMENT:   Consult Enteral/tube feeding initiation and management  ASSESSMENT:   62 y/o male with h/o etoh abuse, substance abuse, hepatitis C, GERD, hiatal hernia and cirrhosis who is admitted with acutre pancreatitis and pancreatic pseudocyst.  -Pt s/p paracentesis 5/3 & 5/7 - Pt s/p NGT placement 5/8 (now removed) 5/17- PEG placed 5/21- s/p BSE- remain NPO and follow-up at next venue of care  Reviewed I/O's: -600 ml x 24 hours and -3 L since 11/30/22  UOP: 600 ml x 24 hours    Pt lying in bed at time of visit. Did not respond to voice. Pt's mental status has decreased significantly since last visit. No family at beside.   Pt remains NPO. Pt receiving nutrition solely from TF. He continues to tolerate well. Osmolite 1.5 infusing via PEG at goal rate of 65 ml/hr.   Wt has been stable over the past week.   Per TOC notes, awaiting SNF placement for discharge.   Medications reviewed and include calcium carbonate, pepcid, folic acid, lasix, miralax, and aldactone.   Labs reviewed: Na: 131, CBGS: 146 (inpatient orders for glycemic control are none).    Diet Order:   Diet Order             Diet NPO  time specified Except for: Ice Chips  Diet effective now                   EDUCATION NEEDS:   No education needs have been identified at this time  Skin:  Skin Assessment: Skin Integrity Issues: Skin Integrity Issues:: Incisions Incisions: lt lower abdomen  Last BM:  12/14/22  Height:   Ht Readings from Last 1 Encounters:  12/08/22 6\' 2"  (1.88 m)    Weight:   Wt Readings from Last 1 Encounters:  12/14/22 66.3 kg    Ideal Body Weight:  86.4 kg  BMI:  Body mass index is 18.77 kg/m.  Estimated Nutritional Needs:   Kcal:  2200-2500kcal/day  Protein:  110-125g/day  Fluid:  > 2 L    Levada Schilling, RD, LDN, CDCES Registered Dietitian II Certified Diabetes Care and Education Specialist Please refer to [facility] for RD and/or RD on-call/weekend/after hours pager

## 2022-12-15 DIAGNOSIS — E512 Wernicke's encephalopathy: Secondary | ICD-10-CM | POA: Diagnosis not present

## 2022-12-15 LAB — CBC WITH DIFFERENTIAL/PLATELET
Abs Immature Granulocytes: 0.02 10*3/uL (ref 0.00–0.07)
Basophils Absolute: 0.1 10*3/uL (ref 0.0–0.1)
Basophils Relative: 1 %
Eosinophils Absolute: 0.4 10*3/uL (ref 0.0–0.5)
Eosinophils Relative: 5 %
HCT: 33 % — ABNORMAL LOW (ref 39.0–52.0)
Hemoglobin: 11.5 g/dL — ABNORMAL LOW (ref 13.0–17.0)
Immature Granulocytes: 0 %
Lymphocytes Relative: 23 %
Lymphs Abs: 1.7 10*3/uL (ref 0.7–4.0)
MCH: 32.6 pg (ref 26.0–34.0)
MCHC: 34.8 g/dL (ref 30.0–36.0)
MCV: 93.5 fL (ref 80.0–100.0)
Monocytes Absolute: 0.8 10*3/uL (ref 0.1–1.0)
Monocytes Relative: 11 %
Neutro Abs: 4.3 10*3/uL (ref 1.7–7.7)
Neutrophils Relative %: 60 %
Platelets: 235 10*3/uL (ref 150–400)
RBC: 3.53 MIL/uL — ABNORMAL LOW (ref 4.22–5.81)
RDW: 13.1 % (ref 11.5–15.5)
WBC: 7.2 10*3/uL (ref 4.0–10.5)
nRBC: 0 % (ref 0.0–0.2)

## 2022-12-15 LAB — BASIC METABOLIC PANEL
Anion gap: 7 (ref 5–15)
BUN: 15 mg/dL (ref 6–20)
CO2: 28 mmol/L (ref 22–32)
Calcium: 8.3 mg/dL — ABNORMAL LOW (ref 8.9–10.3)
Chloride: 99 mmol/L (ref 98–111)
Creatinine, Ser: 0.43 mg/dL — ABNORMAL LOW (ref 0.61–1.24)
GFR, Estimated: >60 mL/min (ref >60)
Glucose, Bld: 126 mg/dL — ABNORMAL HIGH (ref 70–99)
Potassium: 3.9 mmol/L (ref 3.5–5.1)
Sodium: 134 mmol/L — ABNORMAL LOW (ref 135–145)

## 2022-12-15 LAB — GLUCOSE, CAPILLARY
Glucose-Capillary: 129 mg/dL — ABNORMAL HIGH (ref 70–99)
Glucose-Capillary: 96 mg/dL (ref 70–99)
Glucose-Capillary: 129 mg/dL — ABNORMAL HIGH (ref 70–99)
Glucose-Capillary: 135 mg/dL — ABNORMAL HIGH (ref 70–99)
Glucose-Capillary: 160 mg/dL — ABNORMAL HIGH (ref 70–99)

## 2022-12-15 MED ORDER — LACTULOSE 10 GM/15ML PO SOLN
30.0000 g | Freq: Three times a day (TID) | ORAL | Status: DC
  Administered 2022-12-15 – 2022-12-16 (×3): 30 g
  Administered 2022-12-16: 20 g
  Administered 2022-12-16: 30 g
  Filled 2022-12-15 (×5): qty 60

## 2022-12-15 MED ORDER — FREE WATER
160.0000 mL | Freq: Every day | Status: DC
  Administered 2022-12-15 – 2022-12-19 (×20): 160 mL

## 2022-12-15 MED ORDER — OSMOLITE 1.5 CAL PO LIQD
237.0000 mL | Freq: Every day | ORAL | Status: DC
  Administered 2022-12-15 – 2022-12-18 (×10): 237 mL
  Administered 2022-12-18: 118.5 mL
  Administered 2022-12-19 (×4): 237 mL

## 2022-12-15 MED ORDER — METOCLOPRAMIDE HCL 5 MG/ML IJ SOLN
10.0000 mg | Freq: Once | INTRAMUSCULAR | Status: AC
  Administered 2022-12-15: 10 mg via INTRAVENOUS
  Filled 2022-12-15: qty 2

## 2022-12-15 NOTE — Progress Notes (Addendum)
       CROSS COVER NOTE  NAME: [name]. MRN: [id number] DOB : [birth year]    HPI/Events of Note   Report:vomiting episode after receiving meds via peg On review of chart:    Assessment and  Interventions   Assessment:  Plan: Reglan 5 IV x1 Do feeds/meds by slow gravity only       Donnie Mesa NP [facility] Cross Cover 7pm-7am - check amion for availability Pager [phone]

## 2022-12-15 NOTE — Progress Notes (Signed)
Progress Note   Patient: Luke Rogers. ZOX:[id number] DOB: 1960-11-20 DOA: 11/23/2022     22 DOS: the patient was seen and examined on 12/15/2022    Subjective  Mental status continues to improve He is much more awake today and able to engage in conversation He complains of pain in the groin area He also tells me he is constipated for which lactulose have been added Denies nausea vomiting chest pain or cough    Brief Narrative / Hospital Course:  Luke Rogers. is a 62 y.o. male with medical history significant of alcohol abuse, GERD, hepatitis C, history of substance abuse, recurrent pancreatitis. To ED 11/22/22 w/ abd pain and bloating similar to previous pancreatitis episodes (last one was 05/2022). Typical EtOH use 3 tallboys beer per day, recently cut down to 2. (+)intermittent tremor.  05/02: admitted to hospitalist service for treatment pancreatitis. Pt is motivated to quit alcohol and treat withdrawals.  05/03: pain/lipase improving. Paracentesis removed 4L. No apparent SBP.  05/04: CIWA up. Minimal UOP, more confused/agitated, placed Foley. Transferred to stepdown for closer monitoring on phenobarbital.  05/05-05/06: remains in SDU on phenobarbital taper protocol.  05/07: quite agitated, requiring ativan + phenobarb but CIWA scores better on this. Paracentesis repeated removing 2L  05/08: lethargic, not taking po, NG placed.  05/09: CXR right interstitial lung opacities, concern for pneumonia/aspiration, requiring 5L O2 [place], ceftriaxone and azithro  05/10-05/12: more alert, but SLP still unable to work w/ him  05/13: SLP reeval - severe aspiration risk, continue NPO. Neurology consulted. MRI brain and EEG normal. Palliative consulted.  05/14: per neurology, anticipate will improve but this may be slow and may need prolonged nursing care.  05/15: still lethargic/confused. Overnight coughed up mass after he pulled NG tube, sent for pathology, NG unable to be replaced.  D/w GI for PEG 05/16:  Family meeting today, see IPAL note.  12/08/22 (Fri): PEG tube placed. 05/18: confused but abd tender. CT abd/pelv - no acute findings or complications from recent procedure. PT/OT recs for SNF/STR.  05/19-05/20: placement pending which may be a challenge given significant intermittent confusion. Has not needed sitter but has needed mitts.  05/21: significant cognitive improvement today, carrying on coherent conversation, oriented to year, place, person.      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  Has completed high dose of thiamine course   Alcoholic cirrhosis w/ ascites S/p Paracentesis x2 Low PMN, no apparent SBP MELD 12 = 6% 59-month mortality  Child-Pugh Child Class B = Indication for transplant evaluation Will need GI and portal HTN clinic w/ IR follow up outpatient  furosemide and spironolactone titrate as tolerated  EtOH abstinence encouraged  Repeat paracentesis as needed    Encephalopathy related dysphagia  Has pulled NG tube  and this was unable to be re-placed Expect may need long-term tube feeds  Aspiration precautions  PEG tube for feeds and meds SLP following   Acute hypoxic  respiratory failure secondary to community-acquired pneumonia - resolved Abx have been d/c O2 supplementation as needed - has not been necessary    Delirium -  improved Ammonia was very slightly above WNL, but trended back down Given constipation, will give lactulose as needed Consider scheduled lactulose, will trend ammonia --> has been WNL Intermittently follow ammonium  Seroquel qhs   Pancreatic pseudocyst Repeat imaging no concerns    Hypocalcemia Replacing Monitor    Hyponatremia Expect will be chronic given EtOH related liver disease  Monitor BMP   Hypomagnesemia  Replacing  Monitor   Alcohol abuse/dependence counseled on importance of cessation when he was lucid CIWA protocol completed Continue to monitor - per fiancee he is still asking for alcohol when delirious    GERD (gastroesophageal reflux disease) Hiatal hernia  PPI    Tobacco abuse Patient is not ready to quit (asked when lucid) Nicotine patch Counseled on potential complications of smoking extensively, counseled on importance of cessation   Constipation Bowel regimen / as needed   Alcoholic pancreatitis with lipase nearly 1200 on admission  resolved   Abdominal pain Concern for complication from PEG placement, possible SBP/ascites, other. CT showed no concerns. Pain appears resolved/intermittenr.  Monitor  Pain control    Expectoration of questionable lung/gastric mass 05/15 overnight  See photo in media Coughed this up after pulling NG tube Sent for pathology --> mucoid tissue w/ focal reactive squamous epithelium, no ciliated respiratory epithelium, likely upper aerodigestive tract origin, no malignancy      DVT prophylaxis: lovenox    Pertinent IV fluids/nutrition: tube feeds    Code Status: FULL CODE   Current Admission Status: inpatient    TOC needs / Dispo plan: need SNF/STR    Barriers to discharge / significant pending items: placement        Family Communication: Luke Rogers at bedside on rounds        Physical Exam Constitutional:      General: He is not in acute distress.    Appearance: He is not ill-appearing.  Cardiovascular:     Rate and Rhythm: Normal rate and regular rhythm.     Heart sounds: Normal heart sounds.  Pulmonary:     Breath sounds: Normal breath sounds.  Abdominal:     General: Abdomen is flat. Bowel sounds are normal.     Palpations: Abdomen is soft.  PEG tube in place Skin:    General: Skin is warm and dry.  Neurological:     General: No focal deficit present.     Mental Status: He is alert.  Psychiatric:        Mood and Affect: Mood is not anxious.        Behavior: Behavior normal.        Data Reviewed:I have personally reviewed patient's laboratory results today showing sodium 134, potassium 3.9, creatinine 0.43   Time spent: 40 minutes    Vitals:   12/14/22 1928 12/14/22 2350 12/15/22 0502 12/15/22 0722  BP: 108/72 93/67 93/61  95/64  Pulse: 88 76 83 87  Resp: 19 18 18 16   Temp: 98.7 F (37.1 C) 98.7 F (37.1 C) 98.7 F (37.1 C) 99.2 F (37.3 C)  TempSrc: Oral     SpO2: 93% 97% 97% 95%  Weight:      Height:  Author: Loyce Dys, MD 12/15/2022 1:16 PM  For on call review [email or web address].

## 2022-12-15 NOTE — Progress Notes (Addendum)
Brief Nutrition Follow-Up Note  Spoke with Luke Rogers from [place]. Pt will now discharge home tomorrow instead of SNF. Pt will need to be transitioned to bolus feeds. Home health order has been placed. Case discussed with RN, MD, and TOC. Caregiver will need to be educated on bolus feedings and TF care prior to discharge.   Intervention:  1 carton (237 ml) Osmolite 1.5 via PEG 6 times daily  80 ml free water flush before and after each feeding administration  Tube feeding regimen provides 2130 kcal (100% of needs), 89 grams of protein, and 1086 ml of H2O.  Total free water: 2046 ml daily  Luke Rogers, RD, LDN, CDCES Registered Dietitian II Certified Diabetes Care and Education Specialist Please refer to [facility] for RD and/or RD on-call/weekend/after hours pager

## 2022-12-15 NOTE — Progress Notes (Addendum)
Physical Therapy Treatment Patient Details Name: Luke Rogers. MRN: [id number] DOB: [birth year] Today's Date: 12/15/2022   History of Present Illness Pt is a 62 y.o. male with medical history significant of alcohol abuse, GERD, hepatitis C, history of substance abuse, recurrent pancreatitis. To ED 11/22/22 w/ abd pain and bloating similar to previous pancreatitis episodes, has had several paracentesis this admission as well as NGT placed due to lethargy. S/p PEG tube on 12/08/22.    PT Comments    Patient making significant progress towards physical therapy goals. Cognition is improving and oriented this session. Completed bed mobility and sit to stand without assistance. Initially had +2 for safety during ambulation, but patient ambulating 360' with min guard and RW. No LOB noted. Noted skin breakdown at PEG site, RN notified and placed gauze around PEG site. Updated discharge recommendation to reflect current functional level.     Recommendations for follow up therapy are one component of a multi-disciplinary discharge planning process, led by the attending physician.  Recommendations may be updated based on patient status, additional functional criteria and insurance authorization.  Follow Up Recommendations       Assistance Recommended at Discharge Frequent or constant Supervision/Assistance  Patient can return home with the following A little help with walking and/or transfers;A little help with bathing/dressing/bathroom;Assistance with cooking/housework;Direct supervision/assist for medications management;Direct supervision/assist for financial management;Assist for transportation;Help with stairs or ramp for entrance   Equipment Recommendations  Rolling Sherrye Puga (2 wheels)    Recommendations for Other Services       Precautions / Restrictions Precautions Precautions: Fall Precaution Comments: PEG tube Restrictions Weight Bearing Restrictions: No     Mobility  Bed  Mobility Overal bed mobility: Needs Assistance Bed Mobility: Supine to Sit     Supine to sit: Supervision          Transfers Overall transfer level: Needs assistance Equipment used: Rolling Marvie Calender (2 wheels) Transfers: Sit to/from Stand Sit to Stand: Min guard                Ambulation/Gait Ambulation/Gait assistance: Min guard, +2 safety/equipment Gait Distance (Feet): 360 Feet Assistive device: Rolling Nihal Doan (2 wheels) Gait Pattern/deviations: Step-through pattern, Decreased stride length, Trunk flexed Gait velocity: decreased     General Gait Details: min guard for safety. +2 chair follow as precaution due to initial long distance ambulation   Stairs             Wheelchair Mobility    Modified Rankin (Stroke Patients Only)       Balance Overall balance assessment: Needs assistance Sitting-balance support: Feet supported, Single extremity supported, Bilateral upper extremity supported Sitting balance-Leahy Scale: Good     Standing balance support: Bilateral upper extremity supported, During functional activity Standing balance-Leahy Scale: Fair                              Cognition Arousal/Alertness: Awake/alert Behavior During Therapy: Flat affect Overall Cognitive Status: Within Functional Limits for tasks assessed                                 General Comments: cognition improving from previous sessions but may fluctuate        Exercises      General Comments General comments (skin integrity, edema, etc.): skin breakdown noted around PEG tube, RN notified and placed gauze barrier around PEG tube  Pertinent Vitals/Pain Pain Assessment Pain Assessment: Faces Faces Pain Scale: Hurts even more Pain Location: abdomen Pain Descriptors / Indicators: Grimacing, Discomfort Pain Intervention(s): Monitored during session    Home Living                          Prior Function             PT Goals (current goals can now be found in the care plan section) Acute Rehab PT Goals PT Goal Formulation: With patient/family Time For Goal Achievement: 12/23/22 Potential to Achieve Goals: Fair Progress towards PT goals: Progressing toward goals    Frequency    Min 4X/week      PT Plan Discharge plan needs to be updated    Co-evaluation              AM-PAC PT "6 Clicks" Mobility   Outcome Measure  Help needed turning from your back to your side while in a flat bed without using bedrails?: A Little Help needed moving from lying on your back to sitting on the side of a flat bed without using bedrails?: A Little Help needed moving to and from a bed to a chair (including a wheelchair)?: A Little Help needed standing up from a chair using your arms (e.g., wheelchair or bedside chair)?: A Little Help needed to walk in hospital room?: A Little Help needed climbing 3-5 steps with a railing? : A Lot 6 Click Score: 17    End of Session   Activity Tolerance: Patient tolerated treatment well Patient left: in chair;with call bell/phone within reach;with chair alarm set Nurse Communication: Mobility status PT Visit Diagnosis: Unsteadiness on feet (R26.81);Difficulty in walking, not elsewhere classified (R26.2);Muscle weakness (generalized) (M62.81)     Time: 1610-9604 PT Time Calculation (min) (ACUTE ONLY): 24 min  Charges:  $Therapeutic Activity: 8-22 mins                     Maylon Peppers, PT, DPT Physical Therapist - [facility]  [facility]    Desera Graffeo A Arora Coakley 12/15/2022, 1:05 PM

## 2022-12-15 NOTE — Progress Notes (Signed)
Occupational Therapy Treatment Patient Details Name: Luke Rogers. MRN: [id number] DOB: 07/21/1961 Today's Date: 12/15/2022   History of present illness Pt is a 62 y.o. male with medical history significant of alcohol abuse, GERD, hepatitis C, history of substance abuse, recurrent pancreatitis. To ED 11/22/22 w/ abd pain and bloating similar to previous pancreatitis episodes, has had several paracentesis this admission as well as NGT placed due to lethargy. S/p PEG tube on 12/08/22.   OT comments  Upon entering the room, pt supine in bed and agreeable to OT intervention. Pt showing improvement in cognition and safety awareness this session. Pt reports PEG site to be painful. OT removing abdominal binder and asking RN to place gauze as protectant between skin and plastic PEG parts. Pt stands with RW and ambulates 150' with chair follow for safety and then requesting to ambulate once more and this time without needing chair follow. Pt seated in recliner chair at end of session with chair alarm activated. OT discussed with RN removing external catheter for him to ambulate to bathroom with staff for additional mobility throughout the day. RN agreed. Pt making excellent progress towards goals.    Recommendations for follow up therapy are one component of a multi-disciplinary discharge planning process, led by the attending physician.  Recommendations may be updated based on patient status, additional functional criteria and insurance authorization.    Assistance Recommended at Discharge Frequent or constant Supervision/Assistance  Patient can return home with the following  Two people to help with walking and/or transfers;A lot of help with bathing/dressing/bathroom;Assistance with cooking/housework;Assist for transportation;Help with stairs or ramp for entrance;Assistance with feeding;Direct supervision/assist for medications management;Direct supervision/assist for financial management    Equipment Recommendations  Other (comment) (RW)       Precautions / Restrictions Precautions Precautions: Fall Precaution Comments: PEG tube Restrictions Weight Bearing Restrictions: No       Mobility Bed Mobility Overal bed mobility: Needs Assistance Bed Mobility: Supine to Sit     Supine to sit: Supervision          Transfers Overall transfer level: Needs assistance Equipment used: Rolling walker (2 wheels)   Sit to Stand: Min guard                 Balance Overall balance assessment: Needs assistance Sitting-balance support: Feet supported, Single extremity supported, Bilateral upper extremity supported Sitting balance-Leahy Scale: Good Sitting balance - Comments: supervision-minA while seated EOB combing hair   Standing balance support: Bilateral upper extremity supported, During functional activity Standing balance-Leahy Scale: Fair Standing balance comment: assist to maintain standing balance                           ADL either performed or assessed with clinical judgement    Extremity/Trunk Assessment Upper Extremity Assessment Upper Extremity Assessment: Generalized weakness   Lower Extremity Assessment Lower Extremity Assessment: Generalized weakness        Vision Patient Visual Report: No change from baseline            Cognition Arousal/Alertness: Awake/alert Behavior During Therapy: Flat affect Overall Cognitive Status: Within Functional Limits for tasks assessed                                 General Comments: cognition improving from previous sessions but may fluctuate              General  Comments skin breakdown noted around PEG tube, RN notified and placed gauze barrier around PEG tube    Pertinent Vitals/ Pain       Pain Assessment Pain Assessment: Faces Faces Pain Scale: Hurts even more Pain Location: abdomen Pain Descriptors / Indicators: Grimacing, Discomfort Pain Intervention(s):  Monitored during session         Frequency  Min 3X/week        Progress Toward Goals  OT Goals(current goals can now be found in the care plan section)  Progress towards OT goals: Progressing toward goals     Plan Frequency remains appropriate;Discharge plan needs to be updated       AM-PAC OT "6 Clicks" Daily Activity     Outcome Measure   Help from another person eating meals?: A Little Help from another person taking care of personal grooming?: A Little Help from another person toileting, which includes using toliet, bedpan, or urinal?: A Little Help from another person bathing (including washing, rinsing, drying)?: A Little Help from another person to put on and taking off regular upper body clothing?: A Little Help from another person to put on and taking off regular lower body clothing?: A Little 6 Click Score: 18    End of Session Equipment Utilized During Treatment: Rolling walker (2 wheels)  OT Visit Diagnosis: Other abnormalities of gait and mobility (R26.89);Muscle weakness (generalized) (M62.81)   Activity Tolerance Patient tolerated treatment well;Patient limited by fatigue   Patient Left in bed;with call bell/phone within reach;with bed alarm set;with family/visitor present   Nurse Communication Mobility status        Time: 1610-9604 OT Time Calculation (min): 26 min  Charges: OT General Charges $OT Visit: 1 Visit OT Treatments $Therapeutic Activity: 8-22 mins  Jackquline Denmark, MS, OTR/L , CBIS ascom [phone]  12/15/22, 3:45 PM

## 2022-12-15 NOTE — TOC Progression Note (Signed)
Transition of Care (TOC) - Progression Note    Patient Details  Name: Luke Rogers. MRN: [id number] Date of Birth: 1960-11-20  Transition of [facility] CM/SW Contact  Allena Katz, LCSW Phone Number: 12/15/2022, 8:40 AM  Clinical Narrative:    No bed offers for SNF a this time.         Expected Discharge Plan and Services                                               Social Determinants of Health (SDOH) Interventions SDOH Screenings   Food Insecurity: No Food Insecurity (11/23/2022)  Housing: Low Risk  (11/23/2022)  Transportation Needs: No Transportation Needs (11/23/2022)  Utilities: Not At Risk (11/23/2022)  Tobacco Use: High Risk (12/11/2022)    Readmission Risk Interventions     No data to display

## 2022-12-16 DIAGNOSIS — E512 Wernicke's encephalopathy: Secondary | ICD-10-CM | POA: Diagnosis not present

## 2022-12-16 LAB — CBC WITH DIFFERENTIAL/PLATELET
Abs Immature Granulocytes: 0.03 10*3/uL (ref 0.00–0.07)
Basophils Absolute: 0.1 10*3/uL (ref 0.0–0.1)
Basophils Relative: 1 %
Eosinophils Absolute: 0.2 10*3/uL (ref 0.0–0.5)
Eosinophils Relative: 2 %
HCT: 33.8 % — ABNORMAL LOW (ref 39.0–52.0)
Hemoglobin: 11.7 g/dL — ABNORMAL LOW (ref 13.0–17.0)
Immature Granulocytes: 0 %
Lymphocytes Relative: 23 %
Lymphs Abs: 2.3 10*3/uL (ref 0.7–4.0)
MCH: 32.1 pg (ref 26.0–34.0)
MCHC: 34.6 g/dL (ref 30.0–36.0)
MCV: 92.6 fL (ref 80.0–100.0)
Monocytes Absolute: 1 10*3/uL (ref 0.1–1.0)
Monocytes Relative: 10 %
Neutro Abs: 6.4 10*3/uL (ref 1.7–7.7)
Neutrophils Relative %: 64 %
Platelets: 249 10*3/uL (ref 150–400)
RBC: 3.65 MIL/uL — ABNORMAL LOW (ref 4.22–5.81)
RDW: 13.2 % (ref 11.5–15.5)
WBC: 10 10*3/uL (ref 4.0–10.5)
nRBC: 0 % (ref 0.0–0.2)

## 2022-12-16 LAB — GLUCOSE, CAPILLARY
Glucose-Capillary: 109 mg/dL — ABNORMAL HIGH (ref 70–99)
Glucose-Capillary: 112 mg/dL — ABNORMAL HIGH (ref 70–99)
Glucose-Capillary: 118 mg/dL — ABNORMAL HIGH (ref 70–99)
Glucose-Capillary: 132 mg/dL — ABNORMAL HIGH (ref 70–99)
Glucose-Capillary: 156 mg/dL — ABNORMAL HIGH (ref 70–99)
Glucose-Capillary: 77 mg/dL (ref 70–99)
Glucose-Capillary: 97 mg/dL (ref 70–99)

## 2022-12-16 LAB — BASIC METABOLIC PANEL
Anion gap: 6 (ref 5–15)
BUN: 18 mg/dL (ref 6–20)
CO2: 25 mmol/L (ref 22–32)
Calcium: 8.4 mg/dL — ABNORMAL LOW (ref 8.9–10.3)
Chloride: 103 mmol/L (ref 98–111)
Creatinine, Ser: 0.52 mg/dL — ABNORMAL LOW (ref 0.61–1.24)
GFR, Estimated: >60 mL/min (ref >60)
Glucose, Bld: 80 mg/dL (ref 70–99)
Potassium: 3.7 mmol/L (ref 3.5–5.1)
Sodium: 134 mmol/L — ABNORMAL LOW (ref 135–145)

## 2022-12-16 MED ORDER — MELATONIN 5 MG PO TABS
5.0000 mg | ORAL_TABLET | Freq: Once | ORAL | Status: AC
  Administered 2022-12-16: 5 mg via ORAL
  Filled 2022-12-16: qty 1

## 2022-12-16 NOTE — Progress Notes (Signed)
Patient received bolus feed via PEG tube after which he had a vomiting episode. Contacted on call Manuela Schwartz, NP order placed for 1x dose of Reglan and then to try again to administer feds in one hour via slow gravity.

## 2022-12-16 NOTE — Progress Notes (Signed)
Physical Therapy Treatment Patient Details Name: Luke Rogers. MRN: [id number] DOB: October 27, 1960 Today's Date: 12/16/2022   History of Present Illness Pt is a 62 y.o. male with medical history significant of alcohol abuse, GERD, hepatitis C, history of substance abuse, recurrent pancreatitis. To ED 11/22/22 w/ abd pain and bloating similar to previous pancreatitis episodes, has had several paracentesis this admission as well as NGT placed due to lethargy. S/p PEG tube on 12/08/22.    PT Comments    Pt spouse/significant other came into hallway requesting assistance. Pt eagerly needing to get to BR for BM. He impulsivity gets OOB o R side and ambulated to BR with close supervision. Pt staggers but does not require intervention. Pt has diarrhea and endorses having had it throughout the day. Unwilling to attempt further activity due to fear of having a BM in hallway. Pt has overall poor safety awareness however has good strength. Did discuss and educate pt on cessation of alcohol consumption when cleared to DC home.  DC recs remain appropriate.  24/7 supervision  also encouraged.    Recommendations for follow up therapy are one component of a multi-disciplinary discharge planning process, led by the attending physician.  Recommendations may be updated based on patient status, additional functional criteria and insurance authorization.     Assistance Recommended at Discharge Frequent or constant Supervision/Assistance  Patient can return home with the following A little help with walking and/or transfers;A little help with bathing/dressing/bathroom;Assistance with cooking/housework;Direct supervision/assist for medications management;Direct supervision/assist for financial management;Assist for transportation;Help with stairs or ramp for entrance   Equipment Recommendations  Rolling walker (2 wheels)       Precautions / Restrictions Precautions Precautions: Fall Precaution Comments: PEG  tube Restrictions Weight Bearing Restrictions: No     Mobility  Bed Mobility Overal bed mobility: Modified Independent Bed Mobility: Supine to Sit  Supine to sit: Supervision Sit to supine: Supervision   Transfers Overall transfer level: Needs assistance Equipment used: None Transfers: Sit to/from Stand Sit to Stand: Supervision  General transfer comment: pt impulsively able to jump OOB without physical assistance    Ambulation/Gait Ambulation/Gait assistance: Supervision, Min guard Gait Distance (Feet): 20 Feet Assistive device: None Gait Pattern/deviations: Step-through pattern, Staggering left, Staggering right, Narrow base of support Gait velocity: impulsive/fast cadence  General Gait Details: Pt was able to ambulate to/from BR without AD however unsteadiness noted. unwilling to ambulate into hallway due to frequency of BMs    Balance Overall balance assessment: Needs assistance Sitting-balance support: Feet supported, Single extremity supported, Bilateral upper extremity supported Sitting balance-Leahy Scale: Good     Standing balance support: No upper extremity supported, During functional activity Standing balance-Leahy Scale: Fair Standing balance comment: pt staggers but did not require intervention     Cognition Arousal/Alertness: Awake/alert Behavior During Therapy: Impulsive Overall Cognitive Status: Impaired/Different from baseline   Following Commands: Follows one step commands inconsistently Safety/Judgement: Decreased awareness of safety, Decreased awareness of deficits Awareness: Intellectual Problem Solving: Slow processing, Requires verbal cues, Requires tactile cues General Comments: Pt impulsively jumps out of bed to hustle to BR to have BM. Per pt has been having constant movements. Overall poor awareness of situation and per spouse, not at his baseline               Pertinent Vitals/Pain Pain Assessment Pain Assessment: 0-10 Pain Score:  4  Pain Location: abdomen Pain Descriptors / Indicators: Grimacing, Discomfort Pain Intervention(s): Limited activity within patient's tolerance, Monitored during session, Repositioned  PT Goals (current goals can now be found in the care plan section) Acute Rehab PT Goals Patient Stated Goal: go home Progress towards PT goals: Progressing toward goals    Frequency    Min 4X/week      PT Plan Current plan remains appropriate    Co-evaluation     PT goals addressed during session: Mobility/safety with mobility;Balance;Strengthening/ROM        AM-PAC PT "6 Clicks" Mobility   Outcome Measure  Help needed turning from your back to your side while in a flat bed without using bedrails?: None Help needed moving from lying on your back to sitting on the side of a flat bed without using bedrails?: None Help needed moving to and from a bed to a chair (including a wheelchair)?: None Help needed standing up from a chair using your arms (e.g., wheelchair or bedside chair)?: A Little Help needed to walk in hospital room?: A Little Help needed climbing 3-5 steps with a railing? : A Little 6 Click Score: 21    End of Session   Activity Tolerance: Patient limited by fatigue;Treatment limited secondary to medical complications (Comment) (diarrhea) Patient left: in bed;with call bell/phone within reach;with bed alarm set;with family/visitor present Nurse Communication: Mobility status PT Visit Diagnosis: Unsteadiness on feet (R26.81);Difficulty in walking, not elsewhere classified (R26.2);Muscle weakness (generalized) (M62.81)     Time: 0981-1914 PT Time Calculation (min) (ACUTE ONLY): 8 min  Charges:  $Therapeutic Activity: 8-22 mins                    Jetta Lout PTA 12/16/22, 2:34 PM

## 2022-12-16 NOTE — Progress Notes (Signed)
Progress Note   Patient: Luke Rogers. ZOX:[id number] DOB: 12-01-1960 DOA: 11/23/2022     23 DOS: the patient was seen and examined on 12/16/2022    Subjective  Mental status continues to improve PT has reevaluated patient and recommended home health Currently patient awaiting home health arrangements set up and then discharge Denies nausea vomiting chest pain or cough    Brief Narrative / Hospital Course:  Luke Rogers. is a 62 y.o. male with medical history significant of alcohol abuse, GERD, hepatitis C, history of substance abuse, recurrent pancreatitis. To ED 11/22/22 w/ abd pain and bloating similar to previous pancreatitis episodes (last one was 05/2022). Typical EtOH use 3 tallboys beer per day, recently cut down to 2. (+)intermittent tremor.  05/02: admitted to hospitalist service for treatment pancreatitis. Pt is motivated to quit alcohol and treat withdrawals.  05/03: pain/lipase improving. Paracentesis removed 4L. No apparent SBP.  05/04: CIWA up. Minimal UOP, more confused/agitated, placed Foley. Transferred to stepdown for closer monitoring on phenobarbital.  05/05-05/06: remains in SDU on phenobarbital taper protocol.  05/07: quite agitated, requiring ativan + phenobarb but CIWA scores better on this. Paracentesis repeated removing 2L  05/08: lethargic, not taking po, NG placed.  05/09: CXR right interstitial lung opacities, concern for pneumonia/aspiration, requiring 5L O2 [place], ceftriaxone and azithro  05/10-05/12: more alert, but SLP still unable to work w/ him  05/13: SLP reeval - severe aspiration risk, continue NPO. Neurology consulted. MRI brain and EEG normal. Palliative consulted.  05/14: per neurology, anticipate will improve but this may be slow and may need prolonged nursing care.  05/15: still lethargic/confused. Overnight coughed up mass after he pulled NG tube, sent for pathology, NG unable to be replaced. D/w GI for PEG 05/16:  Family meeting  today, see IPAL note.  12/08/22 (Fri): PEG tube placed. 05/18: confused but abd tender. CT abd/pelv - no acute findings or complications from recent procedure. PT/OT recs for SNF/STR.  05/19-05/20: placement pending which may be a challenge given significant intermittent confusion. Has not needed sitter but has needed mitts.  05/21: significant cognitive improvement today, carrying on coherent conversation, oriented to year, place, person.      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, Wernicke-Korsakov - improved   completed benzodiazepine + phenobarbital taper in stepdown unit Neurology has evaluated Has completed high dose of thiamine course Mental status significantly improved   Alcoholic cirrhosis w/ ascites S/p Paracentesis x2 Low PMN, no apparent SBP MELD 12 = 6% 48-month mortality  Child-Pugh Child Class B = Indication for transplant evaluation Will need GI and portal HTN clinic w/ IR follow up outpatient  furosemide and spironolactone titrate as tolerated  EtOH abstinence encouraged  Repeat paracentesis as needed    Encephalopathy related dysphagia  Has pulled NG tube and this was unable to be re-placed Expect may need long-term tube feeds  Aspiration precautions  PEG tube for feeds  and meds SLP following   Acute hypoxic respiratory failure secondary to community-acquired pneumonia -  resolved Abx have been d/c O2 supplementation as needed - has not been necessary    Delirium -  improved Ammonia was very slightly above WNL, but trended back down Continue lactulose Intermittently follow ammonium  Seroquel qhs   Pancreatic pseudocyst Repeat imaging no concerns    Hypocalcemia Replacing Monitor    Hyponatremia Expect will be chronic given EtOH related liver disease  Monitor BMP   Hypomagnesemia  Replacing  Monitor   Alcohol abuse/dependence counseled on importance of cessation when he was lucid CIWA protocol completed Continue to monitor - per fiancee he is still asking for alcohol when delirious    GERD (gastroesophageal reflux disease) Hiatal hernia  PPI    Tobacco abuse Patient is not ready to quit (asked when lucid) Nicotine patch Counseled on potential complications of smoking extensively, counseled on importance of cessation   Constipation Bowel regimen / as needed   Alcoholic pancreatitis with lipase nearly 1200 on admission  resolved   Abdominal pain Concern for complication from PEG placement, possible SBP/ascites, other. CT showed no concerns. Pain appears resolved/intermittenr.  Monitor  Pain control    Expectoration of questionable lung/gastric mass 05/15 overnight  See photo in media Coughed this up after pulling NG tube Sent for pathology --> mucoid tissue w/ focal reactive squamous epithelium, no ciliated respiratory epithelium, likely upper aerodigestive tract origin, no malignancy      DVT prophylaxis: lovenox    Pertinent IV fluids/nutrition: tube feeds    Code Status: FULL CODE   Current Admission Status: inpatient    TOC needs / Dispo plan: HH   Barriers to discharge / significant pending items: Needs home health set up as well as PCP appointment       Family Communication: [name] at bedside on rounds        Physical Exam Constitutional:      General: He is not in acute distress.    Appearance:  He is not ill-appearing.  Cardiovascular:     Rate and Rhythm: Normal rate and regular rhythm.     Heart sounds: Normal heart sounds.  Pulmonary:     Breath sounds: Normal breath sounds.  Abdominal:     General: Abdomen is flat. Bowel sounds are normal.     Palpations: Abdomen is soft.  PEG tube in place Skin:    General: Skin is warm and dry.  Neurological:     General: No focal deficit present.     Mental Status: He is alert.  Psychiatric:        Mood and Affect: Mood is not anxious.        Behavior: Behavior normal.        Data Reviewed:I have personally reviewed patient's laboratory results today  Showing sodium 134 potassium 3.7 creatinine 0.52 hemoglobin 11.7   Time spent: 42 minutes     Vitals:   12/16/22 0414 12/16/22 0500 12/16/22 0724 12/16/22 1139  BP: 107/68  113/70 109/68  Pulse: 93  100 99  Resp: 16  16 17   Temp: 99.1 F (37.3 C)  98.8 F (37.1 C) 98.7 F (37.1 C)  TempSrc:      SpO2: 94%  96% 96%  Weight:  65.4 kg    Height:         Author: Loyce Dys, MD 12/16/2022 12:58 PM  For on call review [email or web address].

## 2022-12-17 ENCOUNTER — Inpatient Hospital Stay: Payer: Medicaid Other

## 2022-12-17 DIAGNOSIS — E512 Wernicke's encephalopathy: Secondary | ICD-10-CM | POA: Diagnosis not present

## 2022-12-17 LAB — BASIC METABOLIC PANEL
Anion gap: 8 (ref 5–15)
BUN: 18 mg/dL (ref 6–20)
CO2: 23 mmol/L (ref 22–32)
Calcium: 8.7 mg/dL — ABNORMAL LOW (ref 8.9–10.3)
Chloride: 104 mmol/L (ref 98–111)
Creatinine, Ser: 0.63 mg/dL (ref 0.61–1.24)
GFR, Estimated: >60 mL/min (ref >60)
Glucose, Bld: 107 mg/dL — ABNORMAL HIGH (ref 70–99)
Potassium: 3.9 mmol/L (ref 3.5–5.1)
Sodium: 135 mmol/L (ref 135–145)

## 2022-12-17 LAB — GLUCOSE, CAPILLARY
Glucose-Capillary: 99 mg/dL (ref 70–99)
Glucose-Capillary: 107 mg/dL — ABNORMAL HIGH (ref 70–99)
Glucose-Capillary: 103 mg/dL — ABNORMAL HIGH (ref 70–99)
Glucose-Capillary: 132 mg/dL — ABNORMAL HIGH (ref 70–99)
Glucose-Capillary: 99 mg/dL (ref 70–99)

## 2022-12-17 MED ORDER — SIMETHICONE 40 MG/0.6ML PO SUSP
80.0000 mg | Freq: Four times a day (QID) | ORAL | Status: DC | PRN
  Administered 2022-12-17 – 2022-12-18 (×5): 80 mg
  Filled 2022-12-17: qty 30
  Filled 2022-12-17 (×3): qty 1.2
  Filled 2022-12-17: qty 30
  Filled 2022-12-17: qty 1.2
  Filled 2022-12-17: qty 30
  Filled 2022-12-17 (×2): qty 1.2

## 2022-12-17 NOTE — TOC Progression Note (Signed)
Transition of Care (TOC) - Progression Note    Patient Details  Name: Luke Rogers. MRN: [id number] Date of Birth: June 11, 1961  Transition of Care [facility]) CM/SW Contact  Dannielle Karvonen Phone Number: 12/17/2022, 10:11 AM  Clinical Narrative:     PT ready for dc per MD, PT recommendation changed from SNF to [facility], CSW reached out to [place], [place], and [facility]. Neither [facility] nor [facility] can accept pt at this time, still waiting to hear back from [facility]. Pt does have Medicaid, securing HH services may be difficult. TOC will continue to follow.        Expected Discharge Plan and Services                                               Social Determinants of Health (SDOH) Interventions SDOH Screenings   Food Insecurity: No Food Insecurity (11/23/2022)  Housing: Low Risk  (11/23/2022)  Transportation Needs: No Transportation Needs (11/23/2022)  Utilities: Not At Risk (11/23/2022)  Tobacco Use: High Risk (12/11/2022)    Readmission Risk Interventions     No data to display

## 2022-12-17 NOTE — Progress Notes (Signed)
PT Cancellation Note  Patient Details Name: Luke Rogers. MRN: [id number] DOB: 1961-02-06   Cancelled Treatment:    Reason Eval/Treat Not Completed: Pain limiting ability to participate. Pt reporting severe abdominal pain on the LLQ and refusing any mobility at this time. Pt requesting more pain meds from RN if any medications were available to him. PT made his RN aware; however, she reported she had recently given him morphine. PT will continue to follow-up with pt acutely as available and appropriate.    Alessandra Bevels Jera Headings 12/17/2022, 10:03 AM

## 2022-12-17 NOTE — Plan of Care (Signed)
Pt awoke with severe abd pain.  Abd is taut and very tender to palpation on L lower quad.  He didn't get relief from pain medication.  Reached out to on call NP - thinking it might be severe gas pain.  Simethicone ordered.  Pt now receiving bolus feeds via gravity.  Don't know if he's tolerating these yet. Pt experienced diarrhea most of yesterday, and is passing gas.  Otherwise, pt is much more lucid and walks steadily to the BR w/standby assist and walker.

## 2022-12-17 NOTE — Progress Notes (Signed)
Progress Note   Patient: Luke Rogers. FUX:[id number] DOB: 1961-02-25 DOA: 11/23/2022     24 DOS: the patient was seen and examined on 12/17/2022     Subjective  Mental status continues to improve Patient tells me yesterday after rounds he had several episodes of diarrhea although this has resolved now. Scheduled lactulose has been discontinued and switched to as needed dose He was seen with the girlfriend at bedside He had some complaints of abdominal pain apparently after having bolus feeding of his tube feeds. KUB been requested Denies nausea vomiting chest pain or cough    Brief Narrative / Hospital Course:  Ragnar Davia. is a 62 y.o. male with medical history significant of alcohol abuse, GERD, hepatitis C, history of substance abuse, recurrent pancreatitis. To ED 11/22/22 w/ abd pain and bloating similar to previous pancreatitis episodes (last one was 05/2022). Typical EtOH use 3 tallboys beer per day, recently cut down to 2. (+)intermittent tremor.  05/02: admitted to hospitalist service for treatment pancreatitis. Pt is motivated to quit alcohol and treat withdrawals.  05/03: pain/lipase improving. Paracentesis removed 4L. No apparent SBP.  05/04: CIWA up. Minimal UOP, more confused/agitated, placed Foley. Transferred to stepdown for closer monitoring on phenobarbital.  05/05-05/06: remains in SDU on phenobarbital taper protocol.  05/07: quite agitated, requiring ativan + phenobarb but CIWA scores better on this. Paracentesis repeated removing 2L  05/08: lethargic, not taking po, NG placed.  05/09: CXR right interstitial lung opacities, concern for pneumonia/aspiration, requiring 5L O2 [place], ceftriaxone and azithro  05/10-05/12: more alert, but SLP still unable to work w/ him  05/13: SLP reeval - severe aspiration risk, continue NPO. Neurology consulted. MRI brain and EEG normal. Palliative consulted.  05/14: per neurology, anticipate will improve but this may be slow  and may need prolonged nursing care.  05/15: still lethargic/confused. Overnight coughed up mass after he pulled NG tube, sent for pathology, NG unable to be replaced. D/w GI for PEG 05/16:  Family meeting today, see IPAL note.  12/08/22 (Fri): PEG tube placed. 05/18: confused but abd tender. CT abd/pelv - no acute findings or complications from recent procedure. PT/OT recs for SNF/STR.  05/19-05/20: placement pending which may be a challenge given significant intermittent confusion. Has not needed sitter but has needed mitts.  05/21: significant cognitive improvement today, carrying on coherent conversation, oriented to year, place, person.      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, Wernicke-Korsakov - improved   completed benzodiazepine + phenobarbital taper in stepdown unit Neurology has evaluated Has completed high dose of thiamine course Mental status significantly improved   Alcoholic cirrhosis w/ ascites S/p Paracentesis x2 Low PMN, no apparent SBP MELD 12 = 6% 16-month mortality  Child-Pugh Child Class B = Indication for transplant evaluation Will need GI and portal HTN clinic w/ IR follow up outpatient  furosemide and spironolactone titrate as tolerated  EtOH abstinence encouraged  Repeat paracentesis as needed    Encephalopathy related dysphagia  Has pulled NG tube and this was unable to  be re-placed Expect may need long-term tube feeds  Aspiration precautions  PEG tube for feeds and meds SLP following   Acute diarrhea likely secondary to scheduled lactulose Lactulose changed to as needed for constipation Monitor bowel movement closely Follow-up on KUB  Acute hypoxic respiratory failure secondary to community-acquired pneumonia - resolved Abx have been d/c O2 supplementation as needed - has not been necessary    Delirium -  improved Ammonia was very slightly above WNL, but trended back down Using lactulose only as needed for constipation Intermittently follow ammonium  Seroquel qhs   Pancreatic pseudocyst Repeat imaging no concerns    Hypocalcemia Replacing Monitor    Hyponatremia Expect will be chronic given EtOH related liver disease  Monitor BMP   Hypomagnesemia  Replacing  Monitor   Alcohol abuse/dependence counseled on importance of cessation when he was lucid CIWA protocol completed Continue to monitor .   GERD (gastroesophageal reflux disease) Hiatal hernia  PPI    Tobacco abuse Patient is not ready to quit (asked when lucid) Nicotine patch Counseled on potential complications of smoking extensively, counseled on importance of cessation   Constipation Bowel regimen / as needed   Alcoholic pancreatitis with lipase nearly 1200 on admission  resolved   Abdominal pain Concern for complication from PEG placement, possible SBP/ascites, other. CT showed no concerns. Pain appears resolved/intermittenr.  Monitor  Pain control    Expectoration of questionable lung/gastric mass 05/15 overnight  See photo in media Coughed this up after pulling NG tube Sent for pathology --> mucoid tissue w/ focal reactive squamous epithelium, no ciliated respiratory epithelium, likely upper aerodigestive tract origin, no malignancy      DVT prophylaxis: lovenox    Pertinent IV fluids/nutrition: tube feeds    Code Status: FULL CODE   Current  Admission Status: inpatient    TOC needs / Dispo plan: HH   Barriers to discharge / significant pending items: Needs home health set up as well as PCP appointment       Family Communication: [name] at bedside on rounds        Physical Exam Constitutional:      General: He is not in acute distress.    Appearance: He is not ill-appearing.  Cardiovascular:     Rate and Rhythm: Normal rate and regular rhythm.     Heart sounds: Normal heart sounds.  Pulmonary:     Breath sounds: Normal breath sounds.  Abdominal:     General: Abdomen is flat. Bowel sounds are normal.     Palpations: Abdomen is soft.  PEG tube in place Skin:    General: Skin is warm and dry.  Neurological:     General: No focal deficit present.     Mental Status: He is alert.  Psychiatric:        Mood and Affect: Mood is not anxious.        Behavior: Behavior normal.        Data Reviewed:I have personally reviewed patient's laboratory results today  Showing sodium 135 potassium 3.9 creatinine 0.6     Time spent: 43 minutes       Vitals:   12/17/22 0304 12/17/22 0312 12/17/22 0751 12/17/22 1151  BP: 100/76  113/73 112/77  Pulse: 88  87 85  Resp: 18  16 17   Temp: 98.6 F (37 C)  99.7 F (37.6 C) 98.4  F (36.9 C)  TempSrc:      SpO2: 95%  93% 91%  Weight:  65.9 kg    Height:        Author: Loyce Dys, MD 12/17/2022 1:32 PM  For on call review [email or web address].

## 2022-12-18 ENCOUNTER — Inpatient Hospital Stay: Payer: Medicaid Other

## 2022-12-18 DIAGNOSIS — K852 Alcohol induced acute pancreatitis without necrosis or infection: Secondary | ICD-10-CM | POA: Diagnosis not present

## 2022-12-18 LAB — BASIC METABOLIC PANEL
Anion gap: 6 (ref 5–15)
BUN: 19 mg/dL (ref 6–20)
CO2: 25 mmol/L (ref 22–32)
Calcium: 8.3 mg/dL — ABNORMAL LOW (ref 8.9–10.3)
Chloride: 101 mmol/L (ref 98–111)
Creatinine, Ser: 0.56 mg/dL — ABNORMAL LOW (ref 0.61–1.24)
GFR, Estimated: >60 mL/min (ref >60)
Glucose, Bld: 93 mg/dL (ref 70–99)
Potassium: 4 mmol/L (ref 3.5–5.1)
Sodium: 132 mmol/L — ABNORMAL LOW (ref 135–145)

## 2022-12-18 LAB — CBC WITH DIFFERENTIAL/PLATELET
Abs Immature Granulocytes: 0.04 10*3/uL (ref 0.00–0.07)
Basophils Absolute: 0.1 10*3/uL (ref 0.0–0.1)
Basophils Relative: 1 %
Eosinophils Absolute: 0.6 10*3/uL — ABNORMAL HIGH (ref 0.0–0.5)
Eosinophils Relative: 6 %
HCT: 34.6 % — ABNORMAL LOW (ref 39.0–52.0)
Hemoglobin: 11.9 g/dL — ABNORMAL LOW (ref 13.0–17.0)
Immature Granulocytes: 0 %
Lymphocytes Relative: 28 %
Lymphs Abs: 3.1 10*3/uL (ref 0.7–4.0)
MCH: 31.6 pg (ref 26.0–34.0)
MCHC: 34.4 g/dL (ref 30.0–36.0)
MCV: 92 fL (ref 80.0–100.0)
Monocytes Absolute: 0.9 10*3/uL (ref 0.1–1.0)
Monocytes Relative: 8 %
Neutro Abs: 6.2 10*3/uL (ref 1.7–7.7)
Neutrophils Relative %: 57 %
Platelets: 235 10*3/uL (ref 150–400)
RBC: 3.76 MIL/uL — ABNORMAL LOW (ref 4.22–5.81)
RDW: 13.1 % (ref 11.5–15.5)
WBC: 10.9 10*3/uL — ABNORMAL HIGH (ref 4.0–10.5)
nRBC: 0 % (ref 0.0–0.2)

## 2022-12-18 LAB — GLUCOSE, CAPILLARY
Glucose-Capillary: 101 mg/dL — ABNORMAL HIGH (ref 70–99)
Glucose-Capillary: 111 mg/dL — ABNORMAL HIGH (ref 70–99)
Glucose-Capillary: 118 mg/dL — ABNORMAL HIGH (ref 70–99)
Glucose-Capillary: 151 mg/dL — ABNORMAL HIGH (ref 70–99)
Glucose-Capillary: 152 mg/dL — ABNORMAL HIGH (ref 70–99)
Glucose-Capillary: 83 mg/dL (ref 70–99)
Glucose-Capillary: 90 mg/dL (ref 70–99)

## 2022-12-18 MED ORDER — IOHEXOL 300 MG/ML  SOLN
100.0000 mL | Freq: Once | INTRAMUSCULAR | Status: AC | PRN
  Administered 2022-12-18: 100 mL via INTRAVENOUS

## 2022-12-18 MED ORDER — MELATONIN 5 MG PO TABS
5.0000 mg | ORAL_TABLET | Freq: Once | ORAL | Status: AC
  Administered 2022-12-18: 5 mg via ORAL
  Filled 2022-12-18: qty 1

## 2022-12-18 NOTE — Progress Notes (Signed)
Progress Note   Patient: Luke Rogers. ZOX:[id number] DOB: 08/12/1960 DOA: 11/23/2022     25 DOS: the patient was seen and examined on 12/18/2022      Subjective  Mental status continues to improve He complained of some abdominal pain KUB showed gaseous distention however CT of the abdomen did not show any intestinal obstruction Denies nausea vomiting chest pain or cough    Brief Narrative / Hospital Course:  Luke Nickell. is a 62 y.o. male with medical history significant of alcohol abuse, GERD, hepatitis C, history of substance abuse, recurrent pancreatitis. To ED 11/22/22 w/ abd pain and bloating similar to previous pancreatitis episodes (last one was 05/2022). Typical EtOH use 3 tallboys beer per day, recently cut down to 2. (+)intermittent tremor.  05/02: admitted to hospitalist service for treatment pancreatitis. Pt is motivated to quit alcohol and treat withdrawals.  05/03: pain/lipase improving. Paracentesis removed 4L. No apparent SBP.  05/04: CIWA up. Minimal UOP, more confused/agitated, placed Foley. Transferred to stepdown for closer monitoring on phenobarbital.  05/05-05/06: remains in SDU on phenobarbital taper protocol.  05/07: quite agitated, requiring ativan + phenobarb but CIWA scores better on this. Paracentesis repeated removing 2L  05/08: lethargic, not taking po, NG placed.  05/09: CXR right interstitial lung opacities, concern for pneumonia/aspiration, requiring 5L O2 [place], ceftriaxone and azithro  05/10-05/12: more alert, but SLP still unable to work w/ him  05/13: SLP reeval - severe aspiration risk, continue NPO. Neurology consulted. MRI brain and EEG normal. Palliative consulted.  05/14: per neurology, anticipate will improve but this may be slow and may need prolonged nursing care.  05/15: still lethargic/confused. Overnight coughed up mass after he pulled NG tube, sent for pathology, NG unable to be replaced. D/w GI for PEG 05/16:  Family meeting  today, see IPAL note.  12/08/22 (Fri): PEG tube placed. 05/18: confused but abd tender. CT abd/pelv - no acute findings or complications from recent procedure. PT/OT recs for SNF/STR.  05/19-05/20: placement pending which may be a challenge given significant intermittent confusion. Has not needed sitter but has needed mitts.  05/21: significant cognitive improvement today, carrying on coherent conversation, oriented to year, place, person.      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, Wernicke-Korsakov - improved   completed benzodiazepine + phenobarbital taper in stepdown unit Neurology has evaluated Has completed high dose of thiamine course Mental status significantly improved   Alcoholic cirrhosis w/ ascites S/p Paracentesis x2 Low PMN, no apparent SBP MELD 12 = 6% 19-month mortality  Child-Pugh Child Class B = Indication for transplant evaluation Will need GI and portal HTN clinic w/ IR follow up outpatient  furosemide and spironolactone titrate as tolerated  EtOH abstinence encouraged  Repeat paracentesis as needed    Encephalopathy related dysphagia  Has pulled NG tube and this was unable to be re-placed Expect may need long-term tube feeds  Aspiration precautions  PEG tube for feeds and meds SLP following   Acute diarrhea likely secondary to scheduled lactulose Lactulose changed to as  needed for constipation Monitor bowel movement closely KUB showed gaseous distention and CT scan of the abdomen and pelvic did not show any evidence of worsening of his pseudocyst or bowel obstruction   Acute hypoxic respiratory failure secondary to community-acquired pneumonia - resolved Abx have been d/c O2 supplementation as needed - has not been necessary    Delirium -  improved Ammonia was very slightly above WNL, but trended back down Using lactulose only as needed for constipation Intermittently follow ammonium  Seroquel qhs   Pancreatic pseudocyst Repeat imaging no concerns    Hypocalcemia Replacing Monitor    Hyponatremia Expect will be chronic given EtOH related liver disease  Monitor BMP   Hypomagnesemia  Replacing  Monitor   Alcohol abuse/dependence counseled on importance of cessation when he was lucid CIWA protocol completed Continue to monitor .   GERD (gastroesophageal reflux disease) Hiatal hernia  PPI    Tobacco abuse Patient is not ready to quit (asked when lucid) Nicotine patch Counseled on potential complications of smoking extensively, counseled on importance of cessation   Constipation Bowel regimen / as needed   Alcoholic pancreatitis with lipase nearly 1200 on admission  resolved   Abdominal pain Concern for complication from PEG placement, possible SBP/ascites, other. CT showed no concerns. Pain appears resolved/intermittenr.  Monitor  Pain control    Expectoration of questionable lung/gastric mass 05/15 overnight  See photo in media Coughed this up after pulling NG tube Sent for pathology --> mucoid tissue w/ focal reactive squamous epithelium, no ciliated respiratory epithelium, likely upper aerodigestive tract origin, no malignancy      DVT prophylaxis: lovenox    Pertinent IV fluids/nutrition: tube feeds    Code Status: FULL CODE   Current Admission Status: inpatient    TOC needs / Dispo plan: HH   Barriers to  discharge / significant pending items: Needs home health set up as well as PCP appointment       Family Communication: Luke Rogers at bedside        Physical Exam Constitutional:      General: He is not in acute distress.    Appearance: He is not ill-appearing.  Cardiovascular:     Rate and Rhythm: Normal rate and regular rhythm.     Heart sounds: Normal heart sounds.  Pulmonary:     Breath sounds: Normal breath sounds.  Abdominal:     General: Full, nondistended with mid abdominal tenderness no guarding    Palpations: Abdomen is soft.  PEG tube in place Skin:    General: Skin is warm and dry.  Neurological:     General: No focal deficit present.     Mental Status: He is alert.  Psychiatric:        Mood and Affect: Mood is not anxious.        Behavior: Behavior normal.        Data Reviewed:I have personally reviewed patient's laboratory results today sodium 132 potassium 4.0 creatinine 0.5 WBC 10.9 hemoglobin 11.9     Time spent: 46 minutes      Vitals:   12/17/22 1535 12/17/22 2000 12/18/22 0401 12/18/22 0754  BP: (!) 115/90 114/73 107/76 102/70  Pulse: 87 87 88 88  Resp: 16 18 20 20   Temp: 98 F (36.7 C) 98.3 F (36.8 C) 98.8 F (37.1 C) 98.4 F (36.9 C)  TempSrc:  Oral Oral   SpO2: 94% 95% 95% 95%  Weight:      Height:        Author: Loyce Dys, MD 12/18/2022 3:09 PM  For on call review [email or web address].

## 2022-12-18 NOTE — TOC Progression Note (Signed)
Transition of Care (TOC) - Progression Note    Patient Details  Name: Luke Rogers. MRN: [id number] Date of Birth: [birth year]  Transition of Care [facility]) CM/SW Contact  Allena Katz, LCSW Phone Number: 12/18/2022, 8:55 AM  Clinical Narrative:     Pt reporting pain with tube feeds in last 24 hours. CT abdomen ordered to rule out SBO.        Expected Discharge Plan and Services                                               Social Determinants of Health (SDOH) Interventions SDOH Screenings   Food Insecurity: No Food Insecurity (11/23/2022)  Housing: Low Risk  (11/23/2022)  Transportation Needs: No Transportation Needs (11/23/2022)  Utilities: Not At Risk (11/23/2022)  Tobacco Use: High Risk (12/11/2022)    Readmission Risk Interventions     No data to display

## 2022-12-18 NOTE — Progress Notes (Signed)
Nutrition Follow-up  DOCUMENTATION CODES:   Severe malnutrition in context of acute illness/injury  INTERVENTION:   -RD will monitor for ability to resume tube feeds  NUTRITION DIAGNOSIS:   Severe Malnutrition related to acute illness as evidenced by severe muscle depletion, severe fat depletion.  Ongoing  GOAL:   Patient will meet greater than or equal to 90% of their needs  Unmet  MONITOR:   Diet advancement, Labs, Weight trends, TF tolerance, Skin, I & O's  REASON FOR ASSESSMENT:   Consult Enteral/tube feeding initiation and management  ASSESSMENT:   62 y/o male with h/o etoh abuse, substance abuse, hepatitis C, GERD, hiatal hernia and cirrhosis who is admitted with acutre pancreatitis and pancreatic pseudocyst.  -Pt s/p paracentesis 5/3 & 5/7 - Pt s/p NGT placement 5/8 (now removed) 5/17- PEG placed 5/21- s/p BSE- remain NPO and follow-up at next venue of care 5/24- transitioned to bolus feedings in anticipation for discharge home  Received voicemail from night shift RN, who reports concern over pt's minimal tolerance to bolus feedings. Pt with difficulty tolerating feedings throughout the weekend, complaining of abdominal pain. Pt has also been prescribed simethicone to help with relief. RN only able to administer half carton of feeding this morning, which pt did not tolerate. Noted pt has refused several bolus feedings over the weekend as well.   Case discussed with RN and MD. KUB from yesterday revealed gas in abdomen. Plan for CT of abdomen today. Per MD, will hold feeds [ending results (concerning for SBO).   Medications reviewed and include calcium carbonate, pepcid, miralax, aldactone, and thiamine.   Labs reviewed: Na: 132, CBGS: 152 (inpatient orders for glycemic control are none).    Diet Order:   Diet Order             Diet NPO time specified Except for: Ice Chips  Diet effective now                   EDUCATION NEEDS:   No education needs  have been identified at this time  Skin:  Skin Assessment: Skin Integrity Issues: Skin Integrity Issues:: Incisions Incisions: lt lower abdomen  Last BM:  12/14/22  Height:   Ht Readings from Last 1 Encounters:  12/08/22 6\' 2"  (1.88 m)    Weight:   Wt Readings from Last 1 Encounters:  12/17/22 65.9 kg    Ideal Body Weight:  86.4 kg  BMI:  Body mass index is 18.65 kg/m.  Estimated Nutritional Needs:   Kcal:  2000-2200  Protein:  90-105 grams  Fluid:  > 2 L    Levada Schilling, RD, LDN, CDCES Registered Dietitian II Certified Diabetes Care and Education Specialist Please refer to [facility] for RD and/or RD on-call/weekend/after hours pager

## 2022-12-19 DIAGNOSIS — K852 Alcohol induced acute pancreatitis without necrosis or infection: Secondary | ICD-10-CM | POA: Diagnosis not present

## 2022-12-19 LAB — BASIC METABOLIC PANEL
Anion gap: 8 (ref 5–15)
BUN: 12 mg/dL (ref 6–20)
CO2: 27 mmol/L (ref 22–32)
Calcium: 8.4 mg/dL — ABNORMAL LOW (ref 8.9–10.3)
Chloride: 98 mmol/L (ref 98–111)
Creatinine, Ser: 0.59 mg/dL — ABNORMAL LOW (ref 0.61–1.24)
GFR, Estimated: >60 mL/min (ref >60)
Glucose, Bld: 94 mg/dL (ref 70–99)
Potassium: 4 mmol/L (ref 3.5–5.1)
Sodium: 133 mmol/L — ABNORMAL LOW (ref 135–145)

## 2022-12-19 LAB — CBC WITH DIFFERENTIAL/PLATELET
Abs Immature Granulocytes: 0.02 10*3/uL (ref 0.00–0.07)
Basophils Absolute: 0.1 10*3/uL (ref 0.0–0.1)
Basophils Relative: 1 %
Eosinophils Absolute: 0.6 10*3/uL — ABNORMAL HIGH (ref 0.0–0.5)
Eosinophils Relative: 7 %
HCT: 36.2 % — ABNORMAL LOW (ref 39.0–52.0)
Hemoglobin: 12.4 g/dL — ABNORMAL LOW (ref 13.0–17.0)
Immature Granulocytes: 0 %
Lymphocytes Relative: 29 %
Lymphs Abs: 2.5 10*3/uL (ref 0.7–4.0)
MCH: 31.6 pg (ref 26.0–34.0)
MCHC: 34.3 g/dL (ref 30.0–36.0)
MCV: 92.3 fL (ref 80.0–100.0)
Monocytes Absolute: 0.8 10*3/uL (ref 0.1–1.0)
Monocytes Relative: 9 %
Neutro Abs: 4.7 10*3/uL (ref 1.7–7.7)
Neutrophils Relative %: 54 %
Platelets: 235 10*3/uL (ref 150–400)
RBC: 3.92 MIL/uL — ABNORMAL LOW (ref 4.22–5.81)
RDW: 13 % (ref 11.5–15.5)
WBC: 8.7 10*3/uL (ref 4.0–10.5)
nRBC: 0 % (ref 0.0–0.2)

## 2022-12-19 LAB — GLUCOSE, CAPILLARY
Glucose-Capillary: 84 mg/dL (ref 70–99)
Glucose-Capillary: 91 mg/dL (ref 70–99)
Glucose-Capillary: 131 mg/dL — ABNORMAL HIGH (ref 70–99)
Glucose-Capillary: 146 mg/dL — ABNORMAL HIGH (ref 70–99)

## 2022-12-19 MED ORDER — SIMETHICONE 40 MG/0.6ML PO SUSP: 80.0000 mg | Freq: Four times a day (QID) | ORAL | 0 refills | Status: DC | PRN

## 2022-12-19 MED ORDER — MELATONIN 5 MG PO TABS
2.5000 mg | ORAL_TABLET | Freq: Once | ORAL | Status: DC
  Filled 2022-12-19: qty 1

## 2022-12-19 MED ORDER — OSMOLITE 1.5 CAL PO LIQD: 237.0000 mL | Freq: Every day | ORAL | 0 refills | Status: DC

## 2022-12-19 MED ORDER — SPIRONOLACTONE 25 MG PO TABS: 12.5000 mg | ORAL_TABLET | Freq: Two times a day (BID) | ORAL | 0 refills | Status: DC

## 2022-12-19 MED ORDER — FREE WATER: 80.0000 mL | Freq: Every day | 0 refills | Status: DC

## 2022-12-19 MED ORDER — VITAMIN B-1 100 MG PO TABS: 100.0000 mg | ORAL_TABLET | Freq: Every day | ORAL | 0 refills | Status: DC

## 2022-12-19 MED ORDER — FAMOTIDINE 20 MG PO TABS: 20.0000 mg | ORAL_TABLET | Freq: Every day | ORAL | 0 refills | Status: DC

## 2022-12-19 MED ORDER — POLYETHYLENE GLYCOL 3350 17 G PO PACK: 17.0000 g | PACK | Freq: Two times a day (BID) | ORAL | 0 refills | Status: DC

## 2022-12-19 MED ORDER — QUETIAPINE FUMARATE 50 MG PO TABS: 50.0000 mg | ORAL_TABLET | Freq: Every day | ORAL | 0 refills | Status: DC

## 2022-12-19 MED ORDER — FUROSEMIDE 20 MG PO TABS: 20.0000 mg | ORAL_TABLET | Freq: Every day | ORAL | 0 refills | Status: DC

## 2022-12-19 MED ORDER — LACTULOSE 10 GM/15ML PO SOLN: 20.0000 g | Freq: Two times a day (BID) | ORAL | 0 refills | Status: DC | PRN

## 2022-12-19 MED ORDER — FOLIC ACID 1 MG PO TABS: 1.0000 mg | ORAL_TABLET | Freq: Every day | ORAL | 0 refills | Status: DC

## 2022-12-19 MED ORDER — ACETAMINOPHEN 325 MG PO TABS: 650.0000 mg | ORAL_TABLET | Freq: Four times a day (QID) | ORAL | 0 refills | Status: DC | PRN

## 2022-12-19 NOTE — TOC Transition Note (Signed)
Transition of Care [facility]) - CM/SW Discharge Note   Patient Details  Name: Luke Rogers. MRN: [id number] Date of Birth: 1961/03/06  [facility]) CM/SW Contact:  Allena Katz, LCSW Phone Number: 12/19/2022, 3:07 PM   Clinical Narrative:     Pt has orders to discharge home with [facility] tube feeds. Pam with [facility] confirmed feeds will be delivered today. CSW unable to secure [facility] due to insurance and no PCP. CSW arranged appointment with Yves Dill for this Friday at 12:45pm. Dr Sherryll Burger to follow orders until then for patients home feeds. Education provided by [employer]. Per RN, pt feels comfortable to discharge home with the feeds. No additional needs at this time. TOC signing off.    Final next level of care: Home/Self Care Barriers to Discharge: Barriers Resolved   Patient Goals and CMS Choice CMS Medicare.gov Compare Post Acute Care list provided to:: Patient Choice offered to / list presented to : Patient  Discharge Placement                         Discharge Plan and Services Additional resources added to the After Visit Summary for                                       Social Determinants of Health (SDOH) Interventions SDOH Screenings   Food Insecurity: No Food Insecurity (11/23/2022)  Housing: Low Risk  (11/23/2022)  Transportation Needs: No Transportation Needs (11/23/2022)  Utilities: Not At Risk (11/23/2022)  Tobacco Use: High Risk (12/11/2022)     Readmission Risk Interventions     No data to display

## 2022-12-19 NOTE — Progress Notes (Signed)
Received Md order to discharge patient to home with tube feeds.   I reviewed discharge instructions, home meds,  prescriptions and follow up apportionments with patient and patient verbalized understanding.  I also reviewed with demonstration and teach-back, self administration of tube feeding supplements, free water flushes and medication administration through PEG  tube along with PEG tube care and both patient and his fiance verbalized understanding.

## 2022-12-19 NOTE — Progress Notes (Signed)
Occupational Therapy Treatment Patient Details Name: Luke Rogers. MRN: [id number] DOB: 08-06-1960 Today's Date: 12/19/2022   History of present illness Pt is a 62 y.o. male with medical history significant of alcohol abuse, GERD, hepatitis C, history of substance abuse, recurrent pancreatitis. To ED 11/22/22 w/ abd pain and bloating similar to previous pancreatitis episodes, has had several paracentesis this admission as well as NGT placed due to lethargy. S/p PEG tube on 12/08/22.   OT comments  Upon entering the room, pt supine in bed with significant other present in room and agreeable to OT intervention. Pt stands from bed with supervision and brushes teeth while standing at sink. Pt declines need for toileting and ambulates without use of AD 400'. Pt initially with several episodes of scissoring , noted with turns, but maintains balance with supervision throughout. Pt returning back to room and reports feeling well and motivated to return home. Pt has all needed equipment. Significant other asking questions about PEG tub education.    Recommendations for follow up therapy are one component of a multi-disciplinary discharge planning process, led by the attending physician.  Recommendations may be updated based on patient status, additional functional criteria and insurance authorization.    Assistance Recommended at Discharge Frequent or constant Supervision/Assistance  Patient can return home with the following  Assistance with cooking/housework;Assist for transportation;Help with stairs or ramp for entrance;Direct supervision/assist for medications management;Direct supervision/assist for financial management;A little help with walking and/or transfers;A little help with bathing/dressing/bathroom   Equipment Recommendations  None recommended by OT       Precautions / Restrictions Precautions Precautions: Fall Precaution Comments: PEG tube       Mobility Bed Mobility Overal  bed mobility: Modified Independent                  Transfers Overall transfer level: Needs assistance Equipment used: None Transfers: Sit to/from Stand Sit to Stand: Supervision                 Balance Overall balance assessment: Needs assistance Sitting-balance support: Feet supported, Single extremity supported, Bilateral upper extremity supported Sitting balance-Leahy Scale: Good     Standing balance support: No upper extremity supported, During functional activity Standing balance-Leahy Scale: Fair                             ADL either performed or assessed with clinical judgement   ADL Overall ADL's : Needs assistance/impaired     Grooming: Wash/dry hands;Wash/dry face;Oral care;Supervision/safety;Standing                                       Vision Patient Visual Report: No change from baseline            Cognition Arousal/Alertness: Awake/alert Behavior During Therapy: WFL for tasks assessed/performed Overall Cognitive Status: Impaired/Different from baseline                         Following Commands: Follows one step commands inconsistently Safety/Judgement: Decreased awareness of safety Awareness: Emergent Problem Solving: Requires verbal cues General Comments: Pt showing increase in awareness during session but still needing min cues for safety awareness.                   Pertinent Vitals/ Pain       Pain Assessment  Pain Assessment: 0-10 Pain Score: 2  Pain Location: abdomen Pain Descriptors / Indicators: Discomfort Pain Intervention(s): Repositioned, Limited activity within patient's tolerance         Frequency  Min 3X/week        Progress Toward Goals  OT Goals(current goals can now be found in the care plan section)  Progress towards OT goals: Progressing toward goals     Plan Frequency remains appropriate;Discharge plan remains appropriate       AM-PAC OT "6 Clicks"  Daily Activity     Outcome Measure   Help from another person eating meals?: A Little Help from another person taking care of personal grooming?: A Little Help from another person toileting, which includes using toliet, bedpan, or urinal?: A Little Help from another person bathing (including washing, rinsing, drying)?: A Little Help from another person to put on and taking off regular upper body clothing?: None Help from another person to put on and taking off regular lower body clothing?: A Little 6 Click Score: 19    End of Session Equipment Utilized During Treatment: Rolling walker (2 wheels)  OT Visit Diagnosis: Other abnormalities of gait and mobility (R26.89);Muscle weakness (generalized) (M62.81)   Activity Tolerance Patient tolerated treatment well   Patient Left in bed;with call bell/phone within reach;with family/visitor present   Nurse Communication Mobility status        Time: 1056-1110 OT Time Calculation (min): 14 min  Charges: OT General Charges $OT Visit: 1 Visit OT Treatments $Self Care/Home Management : 8-22 mins  Jackquline Denmark, MS, OTR/L , CBIS ascom [phone]  12/19/22, 11:46 AM

## 2022-12-19 NOTE — Progress Notes (Signed)
Palliative Care Progress Note, Assessment & Plan   Patient Name: Luke Rogers.       Date: 12/19/2022 DOB: 07/05/1961  Age: 62 y.o. MRN#: [id number] Attending Physician: Loyce Dys, MD Primary Care Physician: Patient, No Pcp Per Admit Date: 11/23/2022  Subjective: Patient is lying in bed in no apparent distress.  He acknowledges my presence and is able to make his wishes known.  RN at bedside is giving Osmolite via PEG tube.  No family or friends present during my visit.  HPI: 62 y.o. male  with past medical history of GERD, hepatitis C, hiatal hernia, history of substance abuse (cocaine, amphetamines), pancreatitis, current tobacco use, and EtOH abuse admitted on 11/23/2022 with bloating, nausea/vomiting, and abdominal pain.   Patient is being treated for acute pancreatitis.  NG in place for decompression.  Paracentesis performed for ascites.  Patient has completed phenobarbital taper.  Patient currently being treated for CAP.  Neurology consulted.  MRI and EEG are WNL. Thiamine taper in place to address suspected Wernicke's.   5/17 -PEG tube placed.  5/24 - transitioned to bolus feeding in anticipation of discharge to home.  Summary of counseling/coordination of care: After reviewing the patient's chart and assessing the patient at bedside, I discussed plan and goals of care with patient.  Patient believes he will be discharged home today.  He shares he is hopeful he can have home health services once discharged.  Symptoms assessed.  Patient endorses mild lower abdominal pain.  Patient endorses pain comes with tube feeds.  Half volume of tube feed given to minimize discomfort.  We discussed as needed medications available for patient to take first abdominal pain.  He endorses he does not want  them at this time but will ask for them when needed.  No adjustments to medications needed at this time.  I attempted to elicit values and goals important to the patient.  Patient asks when he will be able to have his feeding tube removed.  He is wondering why he is unable to eat food at this time.  We reviewed SLP evaluations from previous assessments that patient was not able to safely take in p.o. without risk of aspiration.  However, secure chat sent to SLP to inquire about reevaluation, if feasible, prior to discharge. They plan to see him prior to discharge, likely tomorrow, 5/29.  We discussed advanced care planning.  In the event of a cardiopulmonary arrest, patient would like to remain full code and full scope remain.  Patient is accepting of all offered, available, and appropriate medical interventions to sustain his life.  Therapeutic silence and active listening provided for patient to share his thoughts and emotions regarding current medical situation.  Emotional support provided.  PMT will continue to follow and monitor him peripherally.   Physical Exam Vitals reviewed.  Constitutional:      General: He is not in acute distress.    Appearance: He is not ill-appearing.  HENT:     Mouth/Throat:     Mouth: Mucous membranes are moist.  Cardiovascular:     Rate and Rhythm: Normal rate.     Heart sounds: Normal heart sounds.  Pulmonary:  Effort: Pulmonary effort is normal.  Abdominal:     Tenderness: There is abdominal tenderness in the right lower quadrant and left lower quadrant. There is no guarding or rebound.  Skin:    General: Skin is warm and dry.  Neurological:     Mental Status: He is alert and oriented to person, place, and time.  Psychiatric:        Mood and Affect: Mood normal. Mood is not anxious.        Behavior: Behavior normal.             Total Time 35 minutes   Orvel Cutsforth L. Manon Hilding, FNP-BC Palliative Medicine Team Team Phone # [phone]

## 2022-12-19 NOTE — Evaluation (Addendum)
Clinical/Bedside Swallow Evaluation Patient Details  Name: Luke Rogers. MRN: [id number] Date of Birth: [birth year]  Today's Date: 12/19/2022 Time: SLP Start Time (ACUTE ONLY): 1410 SLP Stop Time (ACUTE ONLY): 1500 SLP Time Calculation (min) (ACUTE ONLY): 50 min  Past Medical History:  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   Pancreatitis    Past Surgical History:  Past Surgical History:  Procedure Laterality Date   FOOT SURGERY Bilateral    As infant   INSERTION OF MESH Bilateral 09/25/2022   Procedure: INSERTION OF MESH;  Surgeon: Campbell Lerner, MD;  Location: [facility] ORS;  Service: General;  Laterality: Bilateral;   PEG PLACEMENT N/A 12/08/2022   Procedure: PERCUTANEOUS ENDOSCOPIC GASTROSTOMY (PEG) PLACEMENT;  Surgeon: Toledo, Boykin Nearing, MD;  Location: [facility] ENDOSCOPY;  Service: Gastroenterology;  Laterality: N/A;   WRIST SURGERY Right    HPI:  Pt is a 62 y.o. male with medical history significant of alcohol abuse(current), GERD, hepatitis C, hiatal hernia, polysubstance abuse, recurrent pancreatitis, and more presents the ED with a chief complaint of bloating and sharp pain diffusely throughout his abdomen and wrapping around the right side of his body. At admit, he reported he has had nausea and vomiting 2 times per day.  The emesis is nonbloody.  His last normal meal was 2 days ago.  His last normal bowel movement was 2 days ago.  His last bout of pancreatitis was in November.  He has recently tried to cut down his drinking; ongoing tobacco use; per H&P chart note: "He has been smoking an average of .5 packs per day. He has been exposed to tobacco smoke. He has never used smokeless tobacco. He reports current alcohol use of about 63.0 standard drinks of alcohol per week. He reports current drug use. Drug:  Amphetamines.".   CT of Abd/CXR: Hiatal hernia(mod) containing stomach and ascites(mod amount) as seen on recent CT  abdomen and pelvis; Acute on chronic pancreatitis, hepatic cirrhosis, lower lungs no acute findings.  PEG placement opted for by Family; placed 12/08/2022 for nutrition needs -- abd tender per chart notes/pt report/NSG(12/12/2022).   Per Neurology note in chart: "neuro - may expect weeks-mos for improvement in cognitive fxn, whatever his situation at 3 mos from now is likely going to be new baseline. Family meeting today, see IPAL note. Full code, full scope, attempt resuscitation but willing to revisit this issue and discuss DNR if he declines further. Goal for facility placement and see how he does over next 3 mos.".  PEG placement w/ ongoing TFs.    Assessment / Plan / Recommendation  Clinical Impression   Pt seen for BSE today(reassessment since improvement from last BSE/CCU admit -- see notes then). Pt awake, verbal and followed commands. Mild distraction noted. Girlfriend present. Much improved presentation and medical status since CCU/BSE post admit. Pt had a PEG placed w/ ongoing TFs managed by the Dietician d/t Severe Malnutrition dx'd. On RA, afebrile. WBC WNL.  Pt appears to present w/ functional oropharyngeal phase swallowing w/ no oropharyngeal phase dysphagia noted, no neuromuscular deficits noted. Pt consumed po trials w/ No overt, clinical s/s of aspiration during po trials. Pt appears at reduced risk for aspiration from an oropharyngeal phase standpoint when following general aspiration precautions.  However, pt has had a  lengthy illness w/ need for PEG placement/ongoing TFs and current deconditioning/weakness. Pt also has a hiatal hernia per H&P; ETOH abuse. These factors can increase risk for REFLUX aspiration, esophageal phase dysphagia.  During po trials, pt consumed consistencies given w/ no overt coughing, decline in vocal quality, or change in respiratory presentation  during/post trials. Oral phase appeared [facility] w/ timely bolus management and oral clearing; full/timely A-P transfer and control for swallowing noted. Oral clearing achieved w/ all trial consistencies.  OM Exam appeared [facility] w/ no unilateral weakness noted. Speech Clear. Has ~3 teeth; poor dentition condition per pt. Pt fed self w/ setup support.   Recommend initiation of a Puree consistency diet w/ moistened foods; Thin liquids(no straw to lessen air swallowed) -- carefully monitoring of balance of oral intake w/ PEG TFs w/ Dietician oversight. Pt can continue to upgrade to more solid foods as long as he can masticate them effectively(for swallowing/Esophageal phase) as he adjusts to adding foods into his diet along w/ PEG TFs.  Recommend general aspiration precautions, Pills WHOLE in Puree for safer, easier swallowing. REFLUX precautions. Education given on Pills in Puree; food consistencies and easy to eat options as he continues to upgrade foods in his diet; general aspiration precautions to pt and girlfriend. NSG to reconsult if any new needs arise. MD updated. TOC/Dietician updated; all agreed. Recommend Dietician f/u for support at D/C.  SLP Visit Diagnosis: Dysphagia, unspecified (R13.10) (PEG TFs)    Aspiration Risk   (reduced from an oropharyngeal phase standpoint; monitor risk for REFLUX aspiration)    Diet Recommendation   initiation of a Puree consistency diet w/ moistened foods; Thin liquids(no straw to lessen air swallowed) -- carefully monitoring of balance of oral intake w/ PEG TFs w/ Dietician oversight. Recommend general aspiration precautions, REFLUX precautions.   Medication Administration: Whole meds with puree    Other  Recommendations Recommended Consults:  (Dietician f/u) Oral Care Recommendations: Oral care BID;Oral care before and after PO;Patient independent with oral care    Recommendations for follow up therapy are one component of a multi-disciplinary discharge planning  process, led by the attending physician.  Recommendations may be updated based on patient status, additional functional criteria and insurance authorization.  Follow up Recommendations No SLP follow up      Assistance Recommended at Discharge  intermittent  Functional Status Assessment Patient has had a recent decline in their functional status and demonstrates the ability to make significant improvements in function in a reasonable and predictable amount of time.  Frequency and Duration  (n/a)   (n/a)       Prognosis Prognosis for improved oropharyngeal function: Good Barriers to Reach Goals: Time post onset;Severity of deficits Barriers/Prognosis Comment: see H&P admit      Swallow Study   General Date of Onset: 11/23/22 HPI: Pt is a 62 y.o. male with medical history significant of alcohol abuse(current), GERD, hepatitis C, hiatal hernia, polysubstance abuse, recurrent pancreatitis, and more presents the ED with a chief complaint of bloating and sharp pain diffusely throughout his abdomen and wrapping around the right side of his body. At admit, he reported he has had nausea and vomiting 2 times per day.  The emesis is nonbloody.  His last normal meal was 2 days ago.  His last normal bowel movement was 2 days ago.  His last bout of pancreatitis was in November.  He has recently tried to cut down his drinking; ongoing tobacco use; per H&P chart note: "He has been smoking an  average of .5 packs per day. He has been exposed to tobacco smoke. He has never used smokeless tobacco. He reports current alcohol use of about 63.0 standard drinks of alcohol per week. He reports current drug use. Drug: Amphetamines.".   CT of Abd/CXR: Hiatal hernia(mod) containing stomach and ascites(mod amount) as seen on recent CT  abdomen and pelvis; Acute on chronic pancreatitis, hepatic cirrhosis, lower lungs no acute findings.  PEG placement opted for by Family; placed 12/08/2022 for nutrition needs -- abd tender per  chart notes/pt report/NSG(12/12/2022).   Per Neurology note in chart: "neuro - may expect weeks-mos for improvement in cognitive fxn, whatever his situation at 3 mos from now is likely going to be new baseline. Family meeting today, see IPAL note. Full code, full scope, attempt resuscitation but willing to revisit this issue and discuss DNR if he declines further. Goal for facility placement and see how he does over next 3 mos.".  PEG placement w/ ongoing TFs. Type of Study: Bedside Swallow Evaluation (reassessment per MD order) Previous Swallow Assessment: 5/13 - this admit Diet Prior to this Study: NPO;G-tube (TFs) Temperature Spikes Noted: No Respiratory Status: Room air History of Recent Intubation: No Behavior/Cognition: Alert;Cooperative;Pleasant mood;Distractible;Requires cueing (min) Oral Cavity Assessment: Within Functional Limits Oral Care Completed by SLP: Yes Oral Cavity - Dentition: Poor condition;Missing dentition Vision: Functional for self-feeding Self-Feeding Abilities: Able to feed self;Needs set up Patient Positioning: Upright in bed (needed positioning encouragement) Baseline Vocal Quality: Normal Volitional Cough: Strong Volitional Swallow: Able to elicit    Oral/Motor/Sensory Function Overall Oral Motor/Sensory Function: Within functional limits   Ice Chips Ice chips: Within functional limits Presentation: Spoon (fed; 2 trials)   Thin Liquid Thin Liquid: Within functional limits Presentation: Cup;Self Fed (10+  trials)    Nectar Thick Nectar Thick Liquid: Not tested   Honey Thick Honey Thick Liquid: Not tested   Puree Puree: Within functional limits Presentation: Self Fed;Spoon (8 trials)   Solid     Solid: Not tested Other Comments: poor dentition status; ongoing TFs        Jerilynn Som, MS, CCC-SLP Speech Language Pathologist Rehab Services; [facility] [phone] (ascom) Tamberly Pomplun 12/19/2022,3:24 PM

## 2022-12-19 NOTE — Progress Notes (Signed)
Physical Therapy Treatment Patient Details Name: Luke Rogers. MRN: [id number] DOB: 11-Aug-1960 Today's Date: 12/19/2022   History of Present Illness Pt is a 62 y.o. male with medical history significant of alcohol abuse, GERD, hepatitis C, history of substance abuse, recurrent pancreatitis. To ED 11/22/22 w/ abd pain and bloating similar to previous pancreatitis episodes, has had several paracentesis this admission as well as NGT placed due to lethargy. S/p PEG tube on 12/08/22.    PT Comments    Patient is agreeable to PT but reports just getting back to bed from walking. He reports walking multiple laps today in the hallway, with and without rolling walker.  Patient agreeable to LE exercises that were performed for strengthening. Caregiver at bedside for education as well for exercises. They are hopeful for discharge home tomorrow and report having a rolling walker and shower chair in place at home if needed. PT will continue to follow while in the hospital to maximize independence and decrease caregiver burden.    Recommendations for follow up therapy are one component of a multi-disciplinary discharge planning process, led by the attending physician.  Recommendations may be updated based on patient status, additional functional criteria and insurance authorization.  Follow Up Recommendations  Can patient physically be transported by private vehicle: No    Assistance Recommended at Discharge Frequent or constant Supervision/Assistance  Patient can return home with the following A little help with walking and/or transfers;A little help with bathing/dressing/bathroom;Assistance with cooking/housework;Direct supervision/assist for medications management;Direct supervision/assist for financial management;Assist for transportation;Help with stairs or ramp for entrance   Equipment Recommendations  None recommended by PT (patient has DME in place per caregiver)    Recommendations for  Other Services       Precautions / Restrictions Precautions Precautions: Fall Precaution Comments: PEG tube Restrictions Weight Bearing Restrictions: No     Mobility  Bed Mobility               General bed mobility comments: Mod I for long sitting and repositioning in bed    Transfers                   General transfer comment: patient declined due to just having ambulated in hallway with staff assistance    Ambulation/Gait                   Stairs             Wheelchair Mobility    Modified Rankin (Stroke Patients Only)       Balance                                            Cognition Arousal/Alertness: Awake/alert Behavior During Therapy: WFL for tasks assessed/performed Overall Cognitive Status: Impaired/Different from baseline                         Following Commands: Follows one step commands consistently Safety/Judgement: Decreased awareness of safety   Problem Solving: Requires verbal cues, Requires tactile cues          Exercises General Exercises - Lower Extremity Ankle Circles/Pumps: AROM, Strengthening, Both, 5 reps, Supine Heel Slides: AROM, Strengthening, Both, 10 reps, Supine Hip ABduction/ADduction: AROM, Strengthening, Both, 10 reps, Supine Straight Leg Raises: AROM, Strengthening, Both, 10 reps, Supine Other Exercises Other Exercises: encouraged LAQ as well  with demonstration provided    General Comments General comments (skin integrity, edema, etc.): patient and significant other/caregiver agreeable to in bed exercises for strengthening. reviewed technique with patient and caregiver and encouraged patient to perform 3 times a day at 10 reps each in conjunction with routine ambulation for upright conditioning. educated patient on performing mobility tasks and exercise when he feels he has the most energy for fall prevention      Pertinent Vitals/Pain Pain Assessment Pain  Assessment: No/denies pain    Home Living                          Prior Function            PT Goals (current goals can now be found in the care plan section) Acute Rehab PT Goals Patient Stated Goal: go home PT Goal Formulation: With patient/family Time For Goal Achievement: 12/23/22 Potential to Achieve Goals: Fair Progress towards PT goals: Progressing toward goals    Frequency    Min 4X/week      PT Plan Current plan remains appropriate    Co-evaluation              AM-PAC PT "6 Clicks" Mobility   Outcome Measure  Help needed turning from your back to your side while in a flat bed without using bedrails?: None Help needed moving from lying on your back to sitting on the side of a flat bed without using bedrails?: None Help needed moving to and from a bed to a chair (including a wheelchair)?: None Help needed standing up from a chair using your arms (e.g., wheelchair or bedside chair)?: A Little Help needed to walk in hospital room?: A Little Help needed climbing 3-5 steps with a railing? : A Little 6 Click Score: 21    End of Session         PT Visit Diagnosis: Unsteadiness on feet (R26.81);Difficulty in walking, not elsewhere classified (R26.2);Muscle weakness (generalized) (M62.81)     Time: 1610-9604 PT Time Calculation (min) (ACUTE ONLY): 16 min  Charges:  $Therapeutic Exercise: 8-22 mins                     Donna Bernard, PT, MPT    Ina Homes 12/19/2022, 3:28 PM

## 2022-12-19 NOTE — Discharge Summary (Addendum)
Physician Discharge Summary   Patient: Luke Rogers. 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, Wernicke-Korsakov - improved   Alcoholic cirrhosis w/ ascites S/p Paracentesis x2 Encephalopathy related dysphagia  Acute diarrhea likely secondary to scheduled lactulose Acute hypoxic respiratory failure secondary to community-acquired pneumonia - resolved Delirium -  improved Pancreatic pseudocyst Hypocalcemia Hyponatremia Hypomagnesemia  Alcohol abuse/dependence GERD (gastroesophageal reflux disease) Hiatal hernia  Tobacco abuse Constipation Alcoholic pancreatitis with lipase nearly 1200 on admission  Abdominal pain Expectoration of questionable lung/gastric mass 05/15 overnight    Hospital Course: Luke Romrell. is a 62 y.o. male with medical history significant of alcohol abuse, GERD, hepatitis C, history of substance abuse, recurrent pancreatitis. To ED 11/22/22 w/ abd pain and bloating similar to previous pancreatitis episodes (last one was 05/2022). Typical EtOH use 3 tallboys beer per day, recently cut down to 2. (+)intermittent tremor.  05/02: admitted to hospitalist service for treatment pancreatitis. Pt is motivated to quit alcohol and treat withdrawals.  05/03: pain/lipase improving. Paracentesis removed 4L. No apparent SBP.  05/04: CIWA up. Minimal UOP, more confused/agitated, placed Foley. Transferred to stepdown for closer monitoring on phenobarbital.  05/05-05/06: remains in SDU on phenobarbital taper protocol.  05/07: quite agitated, requiring ativan + phenobarb but CIWA scores better on this.  Paracentesis repeated removing 2L  05/08: lethargic, not taking po, NG placed.  05/09: CXR right interstitial lung opacities, concern for pneumonia/aspiration, requiring 5L O2 [place], ceftriaxone and azithro  05/10-05/12: more alert, but SLP still unable to work w/ him  05/13: SLP reeval - severe aspiration risk, continue NPO. Neurology consulted. MRI brain and EEG normal. Palliative consulted.  05/14: per neurology, anticipate will improve but this may be slow and may need prolonged nursing care.  05/15: still lethargic/confused. Overnight coughed up mass after he pulled NG tube, sent for pathology, NG unable to be replaced. D/w GI for PEG 05/16:  Family meeting today, see IPAL note.  12/08/22 (Fri): PEG tube placed. 05/18: confused but abd tender. CT abd/pelv - no acute findings or complications from recent procedure. PT/OT recs for SNF/STR.  05/19-05/20: placement pending which may be a challenge given significant intermittent confusion. Has not needed sitter but has needed mitts.  05/21-5/28: significant cognitive improvement.  Able to engage in conversation.  Was reevaluated by physical therapist and patient was walking around with no difficulty and therefore recommendation was changed to home. This was discussed with patient as well as patient's fianc and they have agreed for discharge to continue PEG tube feeding and to follow-up with primary care physician.  Patient was also reevaluated by speech therapist and found to be able to tolerate oral feeding as well but that may not be adequate enough. Initial recommendation to have home health however according to case management patient does not qualify for home health.  This was explained to patient and he has agreed as he is able to handle his tube feeding all by himself.  He has been scheduled to have a PCP appointment to assist with his outpatient management.  Consultants: Neurology, palliative care Procedures performed:  Paracentesis Disposition:  PEG tube feeding Diet recommendation:  Discharge Diet Orders (From admission, onward)  Start     Ordered   12/19/22 0000  Diet - low sodium heart healthy        12/19/22 1025   12/19/22 0000  Diet - low sodium heart healthy        12/19/22 1601           Cardiac diet DISCHARGE MEDICATION: Allergies as of 12/19/2022       Reactions   Tramadol Nausea And Vomiting        Medication List     STOP taking these medications    ibuprofen 800 MG tablet Commonly known as: ADVIL       TAKE these medications    acetaminophen 325 MG tablet Commonly known as: TYLENOL Place 2 tablets (650 mg total) into feeding tube every 6 (six) hours as needed for mild pain (or Fever >/= 101).   famotidine 20 MG tablet Commonly known as: PEPCID Place 1 tablet (20 mg total) into feeding tube daily. Start taking on: Dec 20, 2022   folic acid 1 MG tablet Commonly known as: FOLVITE Place 1 tablet (1 mg total) into feeding tube daily. Start taking on: Dec 20, 2022   furosemide 20 MG tablet Commonly known as: LASIX Place 1 tablet (20 mg total) into feeding tube daily. Start taking on: Dec 20, 2022   lactulose 10 GM/15ML solution Commonly known as: CHRONULAC Take 30 mLs (20 g total) by mouth 2 (two) times daily as needed for severe constipation.   polyethylene glycol 17 g packet Commonly known as: MIRALAX / GLYCOLAX Place 17 g into feeding tube 2 (two) times daily.   QUEtiapine 50 MG tablet Commonly known as: SEROQUEL Place 1 tablet (50 mg total) into feeding tube at bedtime.   simethicone 40 MG/0.6ML drops Commonly known as: MYLICON Place 1.2 mLs (80 mg total) into feeding tube every 6 (six) hours as needed for flatulence.   spironolactone 25 MG tablet Commonly known as: ALDACTONE Place 0.5 tablets (12.5 mg total) into feeding tube 2 (two) times daily.   thiamine 100 MG tablet Commonly known as: Vitamin B-1 Place 1 tablet (100 mg total) into  feeding tube daily. Start taking on: Dec 20, 2022               Durable Medical Equipment  (From admission, onward)           Start     Ordered   12/15/22 1525  For home use only DME Tube feeding  Once       Comments: 1 carton (237 ml) Osmolite 1.5 via PEG 6 times daily  80 ml free water flush before and after each feeding administration  Tube feeding regimen provides 2130 kcal (100% of needs), 89 grams of protein, and 1086 ml of H2O.  Total free water: 2046 ml daily   12/15/22 1526            Discharge Exam: Filed Weights   12/16/22 0500 12/17/22 0312 12/19/22 0143  Weight: 65.4 kg 65.9 kg 63.8 kg   General: He is not in acute distress.    Appearance: He is not ill-appearing.  Cardiovascular:     Rate and Rhythm: Normal rate and regular rhythm.     Heart sounds: Normal heart sounds.  Pulmonary:     Breath sounds: Normal breath sounds.  Abdominal:     General: Full, nondistended with mid abdominal tenderness no guarding    Palpations: Abdomen is soft.  PEG tube in place Skin:    General:  Skin is warm and dry.  Neurological:     General: No focal deficit present.     Mental Status: He is alert.  Psychiatric:        Mood and Affect: Mood is not anxious.        Behavior: Behavior normal.   Condition at discharge: good  The results of significant diagnostics from this hospitalization (including imaging, microbiology, ancillary and laboratory) are listed below for reference.   Imaging Studies: CT ABDOMEN PELVIS W CONTRAST  Result Date: 12/18/2022 CLINICAL DATA:  Abdominal pain, acute. Rule out abdominal obstruction. EXAM: CT ABDOMEN AND PELVIS WITH CONTRAST TECHNIQUE: Multidetector CT imaging of the abdomen and pelvis was performed using the standard protocol following bolus administration of intravenous contrast. RADIATION DOSE REDUCTION: This exam was performed according to the departmental dose-optimization program which includes automated exposure  control, adjustment of the mA and/or kV according to patient size and/or use of iterative reconstruction technique. CONTRAST:  OMNIPAQUE IOHEXOL 300 MG/ML  SOLN COMPARISON:  CT examination dated Dec 09, 2022 FINDINGS: Lower chest: Bibasilar dependent atelectasis. Moderate size hiatal hernia. No acute abnormality. Hepatobiliary: No focal liver abnormality is seen. No gallstones, gallbladder wall thickening, or biliary dilatation. Pancreas: Pancreatic head pseudocyst measuring 4.7 x 6.2 cm, not significantly changed. Cyst near the pancreatic tail measuring 3.1 x 1.9 cm, unchanged. No pancreatic ductal dilatation. Spleen: Normal in size without focal abnormality. Adrenals/Urinary Tract: Adrenal glands are unremarkable. Kidneys are normal, without renal calculi, focal lesion, or hydronephrosis. Simple bilateral exophytic renal cysts, unchanged. Bladder is unremarkable. Stomach/Bowel: Moderate size hiatal hernia. Percutaneous gastrostomy tube in the gastric body. Small bowel loops are normal in caliber. Appendix not identified. Gaseous distention of the ascending and transverse colon. Descending and sigmoid colonic loops are normal in caliber. No evidence of bowel obstruction. Vascular/Lymphatic: No significant vascular findings are present. No enlarged abdominal or pelvic lymph nodes. Reproductive: Prostate is unremarkable. Other: Abdominal ascites with fluid in the right upper quadrant and about the pancreas and in the dependent pelvis, not significantly changed. Previously noted multiple locules of free air are not seen on this examination. Musculoskeletal: No acute or significant osseous findings. IMPRESSION: 1. Gaseous distention of the ascending and transverse colon without evidence of bowel obstruction. 2. Pancreatic head pseudocyst measuring 4.7 x 6.2 cm and cyst near the pancreatic tail measuring 3.1 x 1.9 cm, unchanged. 3. Small to moderate abdominal ascites, not significantly changed. 4. Moderate size  hiatal hernia. 5. Percutaneous gastrostomy tube in the gastric body without evidence of surrounding inflammatory changes or evidence of extraluminal free air. Electronically Signed   By: Larose Hires D.O.   On: 12/18/2022 14:15   DG Abd 1 View  Result Date: 12/17/2022 CLINICAL DATA:  Abdominal pain. Pancreatitis. EXAM: ABDOMEN - 1 VIEW COMPARISON:  CT 12/09/2022 FINDINGS: There is gaseous distention of small bowel in the central abdomen, up to 4.5 cm. Gastrostomy tube projects over the central upper abdomen. Small volume of formed stool in the colon. There are pelvic phleboliths. No evidence of free air on these supine views. IMPRESSION: Gaseous distention of small bowel in the central abdomen, up to 4.5 cm. Favor reactive ileus in the setting of pancreatitis. Consider radiologic follow-up to exclude small bowel obstruction. Electronically Signed   By: Narda Rutherford M.D.   On: 12/17/2022 21:14   DG Shoulder Right  Result Date: 12/14/2022 CLINICAL DATA:  Right shoulder pain. EXAM: RIGHT SHOULDER - 2+ VIEW COMPARISON:  None Available. FINDINGS: The joint spaces are  maintained. No acute fracture is identified. No significant degenerative changes. No abnormal soft tissue calcifications. The visualized right lung is clear and the visualized right ribs are intact. IMPRESSION: No acute bony findings or significant degenerative changes. Electronically Signed   By: Rudie Meyer M.D.   On: 12/14/2022 16:25   CT ABDOMEN PELVIS WO CONTRAST  Result Date: 12/09/2022 CLINICAL DATA:  Postoperative abdominal pain. Peg placement yesterday. Cirrhosis. EXAM: CT ABDOMEN AND PELVIS WITHOUT CONTRAST TECHNIQUE: Multidetector CT imaging of the abdomen and pelvis was performed following the standard protocol without IV contrast. RADIATION DOSE REDUCTION: This exam was performed according to the departmental dose-optimization program which includes automated exposure control, adjustment of the mA and/or kV according to patient  size and/or use of iterative reconstruction technique. COMPARISON:  CT of the abdomen and pelvis 11/22/2022 and 11/09/2022. FINDINGS: Lower chest: Medial left basilar airspace disease likely reflects atelectasis. Mild atelectasis is present right. The heart size is normal. Hepatobiliary: Liver demonstrates a somewhat nodular appearance. No discrete lesions are present. Common bile duct is normal. Fluid surrounds the gallbladder contiguous with other perihepatic fluid Pancreas: A pancreatic pseudocyst the head of the pancreas is stable in size measuring 6.52 cm. Smaller cyst in the head of the pancreas not clearly visible. The cyst near the tail is stable in size at 3.2 cm. Focal mass lesion present. Spleen: Normal in size without focal abnormality. Adrenals/Urinary Tract: Adrenal glands are normal bilaterally. Exophytic lesions at the upper pole of the left kidney are stable. Stone or obstruction is present. The ureters are within normal limits bilaterally. The urinary bladder is. Stomach/Bowel: A large hiatal hernia is present. A PEG tube is in place. Contrast is contained within the stomach and proximal duodenum. The stomach and duodenum are otherwise within. Small bowel is unremarkable. Ileum is within normal limits. Appendix is not discretely visualized and may be surgically absent. Vascular/Lymphatic: Minimal atherosclerotic calcifications are present. No aneurysm is present. No significant retroperitoneal adenopathy is present. Subcentimeter nodes are present porta hepatis. Subcentimeter peripancreatic nodes are present stable. Reproductive: Prostate is unremarkable. Other: Diffuse abdominal ascites is slightly decreased prior CT. Extensive stranding in the mesentery noted. Multiple locules of nondependent free air consistent with recent PEG tube placement. Musculoskeletal: The vertebral heights and alignment are normal. Bony pelvis is normal. Hips are located and within normal limits. IMPRESSION: 1. PEG tube  in place with multiple locules of nondependent free air consistent with recent PEG tube placement. No extravasation contrast from the stomach is present. 2. Diffuse abdominal ascites is slightly decreased. 3. Stable pancreatic pseudocysts. 4. Large hiatal hernia. 5. Stable exophytic lesions at the upper pole of the left kidney, likely cysts. Electronically Signed   By: Marin Roberts M.D.   On: 12/09/2022 13:35   MR BRAIN WO CONTRAST  Result Date: 12/04/2022 CLINICAL DATA:  Initial evaluation for delirium. EXAM: MRI HEAD WITHOUT CONTRAST TECHNIQUE: Multiplanar, multiecho pulse sequences of the brain and surrounding structures were obtained without intravenous contrast. COMPARISON:  Prior CT from 11/30/2022. FINDINGS: Brain: Mildly advanced cerebral atrophy for age. No focal parenchymal signal abnormality or significant cerebral white matter disease. No abnormal foci of restricted diffusion to suggest acute or subacute ischemia. Gray-white matter differentiation maintained. No areas of chronic cortical infarction. No acute or chronic intracranial blood products. No mass lesion, midline shift or mass effect. Ventricles normal size without hydrocephalus. Focal septation versus adhesion noted at the frontal horn of the left lateral ventricle. No extra-axial fluid collection. Pituitary gland and suprasellar region  within normal limits. 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 Vascular: Major intracranial vascular flow voids are maintained. 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 Skull and upper cervical spine: Craniocervical junction within normal limits. Bone marrow signal intensity within normal limits. No scalp soft tissue abnormality. Sinuses/Orbits: Globes and orbital soft tissues within normal limits. Trace layering secretions noted within the right maxillary sinus. Paranasal sinuses are otherwise largely clear. Trace left mastoid effusion noted, of doubtful  significance. Other: None. IMPRESSION: 1. No acute intracranial abnormality. 2. Mildly advanced cerebral atrophy for age. Electronically Signed   By: Rise Mu M.D.   On: 12/04/2022 21:52   EEG adult  Result Date: 12/04/2022 Charlsie Quest, MD     12/04/2022  4:41 PM Patient Name: [name]. MRN: [id number] Epilepsy Attending: Charlsie Quest Referring Physician/Provider: Milon Dikes, MD Date: 12/04/2022 Duration: 27.10 mins Patient history: 62yo M with ams getting eeg to evaluate for seizure Level of alertness: Awake, asleep AEDs during EEG study: Ativan Technical aspects: This EEG study was done with scalp electrodes positioned according to the 10-20 International system of electrode placement. Electrical activity was reviewed with band pass filter of 1-70Hz , sensitivity of 7 uV/mm, display speed of 68mm/sec with a 60Hz  notched filter applied as appropriate. EEG data were recorded continuously and digitally stored.  Video monitoring was available and reviewed as appropriate. Description: The posterior dominant rhythm consists of 8-9 Hz activity of moderate voltage (25-35 uV) seen predominantly in posterior head regions, symmetric and reactive to eye opening and eye closing. Sleep was characterized by vertex waves, sleep spindles (12 to 14 Hz), maximal frontocentral region. Hyperventilation and photic stimulation were not performed.   IMPRESSION: This study is within normal limits. No seizures or epileptiform discharges were seen throughout the recording. A normal interictal EEG does not exclude the diagnosis of epilepsy. Priyanka Annabelle Harman   [place] ASCITES (ABDOMEN LIMITED)  Result Date: 12/02/2022 CLINICAL DATA:  Abdominal distension EXAM: LIMITED ABDOMEN ULTRASOUND FOR ASCITES TECHNIQUE: Limited ultrasound survey for ascites was performed in all four abdominal quadrants. COMPARISON:  None Available. FINDINGS: There is a small amount of fluid adjacent to the liver. No fluid identified  in the right lower, left lower or left upper quadrants. IMPRESSION: Small volume perihepatic ascites. Electronically Signed   By: Emmaline Kluver M.D.   On: 12/02/2022 21:13   DG Chest Port 1 View  Result Date: 12/02/2022 CLINICAL DATA:  Dyspnea EXAM: PORTABLE CHEST 1 VIEW COMPARISON:  11/29/2022 FINDINGS: Mild left basilar atelectasis. Lungs are otherwise clear. No pneumothorax or pleural effusion. Moderate hiatal hernia. Cardiac size within normal limits. Pulmonary vascularity is normal. Nasogastric tube extends into the upper abdomen beyond the margin of the examination. IMPRESSION: 1. Mild left basilar atelectasis. 2. Moderate hiatal hernia. Electronically Signed   By: Helyn Numbers M.D.   On: 12/02/2022 11:48   CT HEAD WO CONTRAST ( )  Result Date: 11/30/2022 CLINICAL DATA:  Encephalopathy EXAM: CT HEAD WITHOUT CONTRAST TECHNIQUE: Contiguous axial images were obtained from the base of the skull through the vertex without intravenous contrast. RADIATION DOSE REDUCTION: This exam was performed according to the departmental dose-optimization program which includes automated exposure control, adjustment of the mA and/or kV according to patient size and/or use of iterative reconstruction technique. COMPARISON:  08/10/2017 FINDINGS: Brain: No evidence of acute infarction, hemorrhage, hydrocephalus, or mass lesion. CSF accumulation posterior to the cerebellum with nondisplaced falx cerebellum, consistent with mega cisterna magna. No significant mass effect on  the cerebellum. Mild generalized cerebral volume loss. Vascular: No hyperdense vessel or unexpected calcification. Skull: Normal. Negative for fracture or focal lesion. Sinuses/Orbits: No acute finding. IMPRESSION: No acute or interval finding. Electronically Signed   By: Tiburcio Pea M.D.   On: 11/30/2022 11:23   DG Abd 1 View  Result Date: 11/29/2022 CLINICAL DATA:  NG tube EXAM: ABDOMEN - 1 VIEW COMPARISON:  11/29/2022 FINDINGS: Esophageal  tube tip and side port overlie the body of stomach. Few air distended loops of small bowel in the left upper quadrant. IMPRESSION: Esophageal tube tip and side port overlie the body of the stomach. Electronically Signed   By: Jasmine Pang M.D.   On: 11/29/2022 20:16   DG Chest Port 1 View  Result Date: 11/29/2022 CLINICAL DATA:  [id number] SOB (shortness of breath) [id number] EXAM: PORTABLE CHEST 1 VIEW COMPARISON:  Radiograph 08/22/2022 FINDINGS: Unchanged cardiomediastinal silhouette. Nasogastric tube passes below the diaphragm, tip excluded by collimation. Hiatal hernia with increased size in comparison to prior chest radiograph representing herniated ascites as seen on recent abdominal CT. There is haziness of the lower lungs, left greater than right, suggesting possible layering effusions and atelectasis. Asymmetric increased right interstitial lung opacities. No acute osseous abnormality. IMPRESSION: Asymmetric increased right interstitial lung opacities, possibly asymmetric edema. Haziness of the lower lungs, left greater than right, suggesting possible layering effusions and atelectasis. Hiatal hernia containing stomach and ascites as seen on recent CT abdomen and pelvis. Electronically Signed   By: Caprice Renshaw M.D.   On: 11/29/2022 18:48   DG Abd 1 View  Result Date: 11/29/2022 CLINICAL DATA:  [id number] Encounter for nasogastric (NG) tube placement [id number] EXAM: ABDOMEN - 1 VIEW COMPARISON:  Graph 11/29/2022 FINDINGS: Advancement of nasogastric tube, side port now overlying the proximal stomach. IMPRESSION: Nasogastric tube side port overlies the proximal stomach, advanced from prior. Could advance another 4.0 cm if desired to ensure adequate placement. Electronically Signed   By: Caprice Renshaw M.D.   On: 11/29/2022 18:45   DG Abd 1 View  Result Date: 11/29/2022 CLINICAL DATA:  NG tube placement EXAM: ABDOMEN - 1 VIEW limited for tube placement COMPARISON:  CT 11/22/2022 FINDINGS: Limited x-ray of the lower  chest and upper abdomen for tube placement demonstrates tip overlying the mid stomach. The side hole is at the diaphragm however on CT scan patient has a hiatal hernia. IMPRESSION: NG tube with tip overlying the mid stomach.  Hiatal hernia. Electronically Signed   By: Karen Kays M.D.   On: 11/29/2022 16:43   US Paracentesis  Result Date: 11/28/2022 INDICATION: 62 year old male inpatient. History of alcoholic pancreatitis. Request for diagnostic and therapeutic paracentesis EXAM: ULTRASOUND GUIDED THERAPEUTIC AND DIAGNOSTIC PARACENTESIS MEDICATIONS: Lidocaine 1% 10 mL COMPLICATIONS: None immediate. PROCEDURE: Informed written consent was obtained from the patient after a discussion of the risks, benefits and alternatives to treatment. A timeout was performed prior to the initiation of the procedure. Initial ultrasound scanning demonstrates a small amount of ascites within the right lower abdominal quadrant. The right lower abdomen was prepped and draped in the usual sterile fashion. 1% lidocaine was used for local anesthesia. Following this, a 19 gauge, 7-cm, Yueh catheter was introduced. An ultrasound image was saved for documentation purposes. The paracentesis was performed. The catheter was removed and a dressing was applied. The patient tolerated the procedure well without immediate post procedural complication. FINDINGS: A total of approximately 2 L of straw-colored fluid was removed. Samples were sent to the laboratory as requested by  the clinical team. IMPRESSION: Successful ultrasound-guided therapeutic and diagnostic paracentesis yielding 2 liters of peritoneal fluid. Read by: Anders Grant, NP PLAN: The patient has required >/=2 paracenteses in a 30 day period and a formal evaluation by the [facility] has been arranged. Electronically Signed   By: Olive Bass M.D.   On: 11/28/2022 15:14   US Paracentesis  Result Date: 11/24/2022 INDICATION:  Ascites; history of alcoholic pancreatitis. Ultrasound performed 11/23/2022 reveals advanced cirrhosis with ascites. EXAM: ULTRASOUND GUIDED diagnostic and therapeutic PARACENTESIS MEDICATIONS: 6 cc 1% lidocaine COMPLICATIONS: None immediate. PROCEDURE: Informed written consent was obtained from the patient after a discussion of the risks, benefits and alternatives to treatment. A timeout was performed prior to the initiation of the procedure. Initial ultrasound scanning demonstrates a large amount of ascites within the right lower abdominal quadrant. The right lower abdomen was prepped and draped in the usual sterile fashion. 1% lidocaine was used for local anesthesia. Following this, a 19 gauge, 7-cm, Yueh catheter was introduced. An ultrasound image was saved for documentation purposes. The paracentesis was performed. The catheter was removed and a dressing was applied. The patient tolerated the procedure well without immediate post procedural complication. FINDINGS: A total of approximately 4 L of dark yellow fluid was removed. Samples were sent to the laboratory as requested by the clinical team. IMPRESSION: Successful ultrasound-guided paracentesis yielding 4 liters of peritoneal fluid. PLAN: If the patient eventually requires >/=2 paracenteses in a 30 day period, candidacy for formal evaluation by the [facility] will be assessed. Electronically Signed   By: Olive Bass M.D.   On: 11/24/2022 16:10   US Abdomen Limited RUQ (LIVER/GB)  Result Date: 11/23/2022 CLINICAL DATA:  62 year old male with history of alcoholic pancreatitis. EXAM: ULTRASOUND ABDOMEN LIMITED RIGHT UPPER QUADRANT COMPARISON:  No priors. FINDINGS: Gallbladder: No gallstones or wall thickening visualized. No sonographic Murphy sign noted by sonographer. Common bile duct: Diameter: 5.7 mm Liver: Liver has a nodular contour, indicative of underlying cirrhosis. No focal lesion identified.  Within normal limits in parenchymal echogenicity. Portal vein is patent on color Doppler imaging with normal direction of blood flow towards the liver. Other: Ascites. Small right pleural effusion. Anechoic lesion with increased through transmission in the region of the proximal pancreatic body with some dependent echogenic nonshadowing material (presumably internal debris), corresponding with large pancreatic pseudocyst noted on recent CT examination, estimated to measure 6.2 x 5.8 x 5.9 cm on today's study. IMPRESSION: 1. No gallstones or findings to suggest an acute cholecystitis. 2. Advanced cirrhosis with ascites redemonstrated. 3. Large pancreatic pseudocyst again noted. 4. Small right pleural effusion. Electronically Signed   By: Trudie Reed M.D.   On: 11/23/2022 06:26   CT ABDOMEN PELVIS W CONTRAST  Result Date: 11/22/2022 CLINICAL DATA:  Severe acute pancreatitis. Abdominal pain and bloating. Nausea and vomiting. EXAM: CT ABDOMEN AND PELVIS WITH CONTRAST TECHNIQUE: Multidetector CT imaging of the abdomen and pelvis was performed using the standard protocol following bolus administration of intravenous contrast. RADIATION DOSE REDUCTION: This exam was performed according to the departmental dose-optimization program which includes automated exposure control, adjustment of the mA and/or kV according to patient size and/or use of iterative reconstruction technique. CONTRAST:  OMNIPAQUE IOHEXOL 300 MG/ML  SOLN COMPARISON:  09/10/2022 FINDINGS: Lower Chest: No acute findings. Hepatobiliary: Capsular nodularity of the liver is consistent with cirrhosis. No hepatic masses are identified. Recanalization of paraumbilical veins again seen, consistent with portal venous hypertension. Gallbladder  is unremarkable. No evidence of biliary ductal dilatation. Pancreas: Peripancreatic soft tissue stranding is again seen, greatest in the left upper quadrant, consistent with acute pancreatitis. Scattered  pancreatic calcifications are consistent with chronic pancreatitis. A thin walled fluid collection is again seen in the pancreatic neck which measures 6.4 x 5.7 cm, mildly increased in size since previous study. Adjacent smaller low-attenuation lesion is seen in the pancreatic head which measures 2.3 x 1.5 cm, also mildly increased since previous study. These are consistent with pancreatic pseudocysts. Spleen: Within normal limits in size and appearance. Adrenals/Urinary Tract: No suspicious masses identified. No evidence of ureteral calculi or hydronephrosis. Stomach/Bowel: Moderate size hiatal hernia is again seen which also contains some ascites. Wall thickening of the splenic flexure of the colon is again seen, which is reactive in etiology secondary to acute pancreatitis. No evidence of bowel obstruction or focal inflammatory process. Moderate to large amount of ascites has increased since prior exam. Vascular/Lymphatic: Significant change in shotty sub-centimeter porta hepatis lymph nodes, likely reactive in etiology. No pathologically enlarged lymph nodes. No acute vascular findings. Reproductive:  No mass or other significant abnormality. Other:  None. Musculoskeletal:  No suspicious bone lesions identified. IMPRESSION: Acute on chronic pancreatitis, with mild increase in size of pseudocysts in the pancreatic neck and pancreatic head since prior exam. Stable wall thickening of the splenic flexure of the colon, which is reactive in etiology secondary to acute pancreatitis. Moderate to large amount of ascites, increased since prior exam. Hepatic cirrhosis and findings of portal venous hypertension. No evidence of hepatic neoplasm. Stable moderate hiatal hernia. Electronically Signed   By: Danae Orleans M.D.   On: 11/22/2022 21:59    Microbiology: Results for orders placed or performed during the hospital encounter of 11/23/22  MRSA Next Gen by PCR, Nasal     Status: None   Collection Time: 11/25/22  4:45  PM   Specimen: Nasal Mucosa; Nasal Swab  Result Value Ref Range Status   MRSA by PCR Next Gen NOT DETECTED NOT DETECTED Final    Comment: (NOTE) The GeneXpert MRSA Assay (FDA approved for NASAL specimens only), is one component of a comprehensive MRSA colonization surveillance program. It is not intended to diagnose MRSA infection nor to guide or monitor treatment for MRSA infections. Test performance is not FDA approved in patients less than 39 years old. Performed at [facility], [address] [place], [place] [address]   Culture, blood (Routine X 2) w Reflex to ID Panel     Status: None   Collection Time: 11/29/22  8:39 PM   Specimen: BLOOD RIGHT ARM  Result Value Ref Range Status   Specimen Description BLOOD RIGHT ARM  Final   Special Requests   Final    BOTTLES DRAWN AEROBIC AND ANAEROBIC Blood Culture results may not be optimal due to an excessive volume of blood received in culture bottles   Culture   Final    NO GROWTH 5 DAYS Performed at [facility], [address] [place], [place] [address]    Report Status 12/04/2022 FINAL  Final  Culture, blood (Routine X 2) w Reflex to ID Panel     Status: None   Collection Time: 11/29/22  8:46 PM   Specimen: BLOOD RIGHT HAND  Result Value Ref Range Status   Specimen Description BLOOD RIGHT HAND  Final   Special Requests   Final    BOTTLES DRAWN AEROBIC AND ANAEROBIC Blood Culture adequate volume   Culture  Final    NO GROWTH 5 DAYS Performed at [facility], [address] [place] [place], [place] [address]    Report Status 12/04/2022 FINAL  Final    Labs: CBC: Recent Labs  Lab 12/14/22 0554 12/15/22 0449 12/16/22 0435 12/18/22 0421 12/19/22 0506  WBC 9.1 7.2 10.0 10.9* 8.7  NEUTROABS 6.2 4.3 6.4 6.2 4.7  HGB 11.6* 11.5* 11.7* 11.9* 12.4*  HCT 33.5* 33.0* 33.8* 34.6* 36.2*  MCV 91.8 93.5 92.6 92.0 92.3  PLT 251 235 249 235 235   Basic Metabolic Panel: Recent Labs  Lab 12/15/22 0449  12/16/22 0435 12/17/22 0426 12/18/22 0421 12/19/22 0506  NA 134* 134* 135 132* 133*  K 3.9 3.7 3.9 4.0 4.0  CL 99 103 104 101 98  CO2 28 25 23 25 27   GLUCOSE 126* 80 107* 93 94  BUN 15 18 18 19 12   CREATININE 0.43* 0.52* 0.63 0.56* 0.59*  CALCIUM 8.3* 8.4* 8.7* 8.3* 8.4*   Liver Function Tests: No results for input(s): "AST", "ALT", "ALKPHOS", "BILITOT", "PROT", "ALBUMIN" in the last 168 hours. CBG: Recent Labs  Lab 12/18/22 2021 12/18/22 2354 12/19/22 0409 12/19/22 0844 12/19/22 1143  GLUCAP 83 111* 84 91 131*    Discharge time spent: greater than 30 minutes.  Signed: Loyce Dys, MD [facility] 12/19/2022    1 carton (237 ml) Osmolite 1.5 via PEG 6 times daily   80 ml free water flush before and after each feeding administration

## 2022-12-22 ENCOUNTER — Encounter: Payer: Self-pay | Admitting: Internal Medicine

## 2022-12-22 ENCOUNTER — Ambulatory Visit: Payer: Medicaid Other | Admitting: Internal Medicine

## 2022-12-22 VITALS — BP 108/58 | HR 92 | Ht 74.5 in | Wt 140.0 lb

## 2022-12-22 DIAGNOSIS — F1010 Alcohol abuse, uncomplicated: Secondary | ICD-10-CM

## 2022-12-22 DIAGNOSIS — K852 Alcohol induced acute pancreatitis without necrosis or infection: Secondary | ICD-10-CM

## 2022-12-22 DIAGNOSIS — E43 Unspecified severe protein-calorie malnutrition: Secondary | ICD-10-CM

## 2022-12-22 DIAGNOSIS — K219 Gastro-esophageal reflux disease without esophagitis: Secondary | ICD-10-CM

## 2022-12-22 DIAGNOSIS — F411 Generalized anxiety disorder: Secondary | ICD-10-CM | POA: Diagnosis not present

## 2022-12-22 DIAGNOSIS — B1920 Unspecified viral hepatitis C without hepatic coma: Secondary | ICD-10-CM

## 2022-12-22 DIAGNOSIS — E871 Hypo-osmolality and hyponatremia: Secondary | ICD-10-CM | POA: Diagnosis not present

## 2022-12-22 DIAGNOSIS — F17200 Nicotine dependence, unspecified, uncomplicated: Secondary | ICD-10-CM

## 2022-12-22 MED ORDER — FOLIC ACID 1 MG PO TABS: 1.0000 mg | ORAL_TABLET | Freq: Every day | ORAL | 3 refills | Status: DC

## 2022-12-22 MED ORDER — LACTULOSE 10 GM/15ML PO SOLN
20.0000 g | Freq: Two times a day (BID) | ORAL | 0 refills | Status: DC | PRN
Start: 2022-12-22 — End: 2023-06-12

## 2022-12-22 MED ORDER — QUETIAPINE FUMARATE 50 MG PO TABS: 50.0000 mg | ORAL_TABLET | Freq: Every day | ORAL | 0 refills | Status: DC

## 2022-12-22 MED ORDER — FUROSEMIDE 20 MG PO TABS: 20.0000 mg | ORAL_TABLET | Freq: Every day | ORAL | 1 refills | Status: DC

## 2022-12-22 MED ORDER — SIMETHICONE 40 MG/0.6ML PO SUSP: 80.0000 mg | Freq: Four times a day (QID) | ORAL | 0 refills | Status: DC | PRN

## 2022-12-22 MED ORDER — FAMOTIDINE 20 MG PO TABS: 20.0000 mg | ORAL_TABLET | Freq: Every day | ORAL | 3 refills | Status: DC

## 2022-12-22 MED ORDER — VITAMIN B-1 100 MG PO TABS
100.0000 mg | ORAL_TABLET | Freq: Every day | ORAL | 0 refills | Status: DC
Start: 2022-12-22 — End: 2023-06-12

## 2022-12-22 NOTE — Progress Notes (Signed)
New Patient Office Visit  Subjective    Patient ID: Luke Edgecomb., male    DOB: 1960-08-13  Age: 62 y.o. MRN: [id number]  CC:  Chief Complaint  Patient presents with   Establish Care    New Patient    HPI Luke Ally. presents to establish care   he does not have additional concerns to discuss today.   Patient comes in with his fiance to establish primary care today.  He was recently discharged from [facility] after a stay of 26 days.  He was admitted for alcoholic pancreatitis.  And then had several other complications related to his alcohol abuse.  Patient also has history of hepatitis C, drug abuse, cigarette smoking.   During his hospital stay he he was also treated for alcohol withdrawal, alcoholic encephalopathy ,pneumonia, ascites, dysphagia, poor p.o. intake, and had a PEG tube placed for calorie malnutrition . Today patient is sitting comfortably although he looks malnourished.  According to his fiance he is getting PEG tube feedings but he has also started to eat small meals which are soft and easy to swallow.  He is also able to drink some fluids.  He is not complaining of any pain and feels like his energy level is improving. Denies nausea vomiting, no chest pain and no shortness of breath.  He does not have any diarrhea or constipation.  No urinary complaints.    Outpatient Encounter Medications as of 12/22/2022  Medication Sig   acetaminophen (TYLENOL) 325 MG tablet Place 2 tablets (650 mg total) into feeding tube every 6 (six) hours as needed for mild pain (or Fever >/= 101).   polyethylene glycol (MIRALAX / GLYCOLAX) 17 g packet Place 17 g into feeding tube 2 (two) times daily.   spironolactone (ALDACTONE) 25 MG tablet Place 0.5 tablets (12.5 mg total) into feeding tube 2 (two) times daily.   [DISCONTINUED] famotidine (PEPCID) 20 MG tablet Place 1 tablet (20 mg total) into feeding tube daily.   [DISCONTINUED] folic acid (FOLVITE) 1 MG tablet Place 1  tablet (1 mg total) into feeding tube daily.   [DISCONTINUED] furosemide (LASIX) 20 MG tablet Place 1 tablet (20 mg total) into feeding tube daily.   [DISCONTINUED] lactulose (CHRONULAC) 10 GM/15ML solution Take 30 mLs (20 g total) by mouth 2 (two) times daily as needed for severe constipation.   [DISCONTINUED] QUEtiapine (SEROQUEL) 50 MG tablet Place 1 tablet (50 mg total) into feeding tube at bedtime.   [DISCONTINUED] thiamine (VITAMIN B-1) 100 MG tablet Place 1 tablet (100 mg total) into feeding tube daily.   famotidine (PEPCID) 20 MG tablet Place 1 tablet (20 mg total) into feeding tube daily.   folic acid (FOLVITE) 1 MG tablet Place 1 tablet (1 mg total) into feeding tube daily.   furosemide (LASIX) 20 MG tablet Place 1 tablet (20 mg total) into feeding tube daily.   lactulose (CHRONULAC) 10 GM/15ML solution Take 30 mLs (20 g total) by mouth 2 (two) times daily as needed for severe constipation.   Nutritional Supplements (FEEDING SUPPLEMENT, OSMOLITE 1.5 CAL,) LIQD Place 237 mLs into feeding tube 6 (six) times daily.   QUEtiapine (SEROQUEL) 50 MG tablet Place 1 tablet (50 mg total) into feeding tube at bedtime.   simethicone (MYLICON) 40 MG/0.6ML drops Place 1.2 mLs (80 mg total) into feeding tube every 6 (six) hours as needed for flatulence.   thiamine (VITAMIN B-1) 100 MG tablet Place 1 tablet (100 mg total) into feeding tube daily.  Water For Irrigation, Sterile (FREE WATER) SOLN Place 80 mLs into feeding tube 6 (six) times daily.   [DISCONTINUED] simethicone (MYLICON) 40 MG/0.6ML drops Place 1.2 mLs (80 mg total) into feeding tube every 6 (six) hours as needed for flatulence.   No facility-administered encounter medications on file as of 12/22/2022.    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   Pancreatitis     Past  Surgical History:  Procedure Laterality Date   FOOT SURGERY Bilateral    As infant   INSERTION OF MESH Bilateral 09/25/2022   Procedure: INSERTION OF MESH;  Surgeon: Campbell Lerner, MD;  Location: [facility] ORS;  Service: General;  Laterality: Bilateral;   PEG PLACEMENT N/A 12/08/2022   Procedure: PERCUTANEOUS ENDOSCOPIC GASTROSTOMY (PEG) PLACEMENT;  Surgeon: Toledo, Boykin Nearing, MD;  Location: [facility] ENDOSCOPY;  Service: Gastroenterology;  Laterality: N/A;   WRIST SURGERY Right     Family History  Problem Relation Age of Onset   Hypertension Mother     Social History   Socioeconomic History   Marital status: Single    Spouse name: Not on file   Number of children: Not on file   Years of education: Not on file   Highest education level: Not on file  Occupational History   Not on file  Tobacco Use   Smoking status: Every Day    Packs/day: .5    Types: Cigarettes    Passive exposure: Past   Smokeless tobacco: Never  Vaping Use   Vaping Use: Never used  Substance and Sexual Activity   Alcohol use: Yes    Alcohol/week: 63.0 standard drinks of alcohol    Types: 63 Cans of beer per week   Drug use: Yes    Types: Amphetamines   Sexual activity: Yes  Other Topics Concern   Not on file  Social History Narrative   Not on file   Social Determinants of Health   Financial Resource Strain: Not on file  Food Insecurity: No Food Insecurity (11/23/2022)   Hunger Vital Sign    Worried About Running Out of Food in the Last Year: Never true    Ran Out of Food in the Last Year: Never true  Transportation Needs: No Transportation Needs (11/23/2022)   PRAPARE - Administrator, Civil Service (Medical): No    Lack of Transportation (Non-Medical): No  Physical Activity: Not on file  Stress: Not on file  Social Connections: Not on file  Intimate Partner Violence: Not At Risk (11/23/2022)   Humiliation, Afraid, Rape, and Kick questionnaire    Fear of Current or Ex-Partner: No     Emotionally Abused: No    Physically Abused: No    Sexually Abused: No    Review of Systems  Constitutional:  Positive for malaise/fatigue and weight loss. Negative for chills, diaphoresis and fever.  HENT:  Negative for congestion, ear discharge, ear pain, hearing loss, sore throat and tinnitus.   Eyes: Negative.   Respiratory:  Negative for cough, sputum production, shortness of breath, wheezing and stridor.   Cardiovascular:  Negative for chest pain, palpitations, orthopnea, leg swelling and PND.  Gastrointestinal:  Negative for blood in stool, constipation, diarrhea, heartburn, melena, nausea and vomiting.  Genitourinary:  Negative for dysuria, flank pain, hematuria and urgency.  Musculoskeletal:  Negative for falls, joint pain, myalgias and neck pain.  Neurological:  Negative for dizziness, tingling, sensory change, seizures, loss of consciousness, weakness and headaches.  Psychiatric/Behavioral:  Positive for depression. Negative for hallucinations, memory loss and substance abuse. The patient is not nervous/anxious and does not have insomnia.         Objective    BP (!) 108/58   Pulse 92   Ht 6' 2.5" (1.892 m)   Wt 140 lb (63.5 kg)   SpO2 97%   BMI 17.73 kg/m   Physical Exam Vitals and nursing note reviewed.  Constitutional:      General: He is not in acute distress.    Appearance: He is ill-appearing. He is not toxic-appearing or diaphoretic.  HENT:     Head: Normocephalic and atraumatic.     Right Ear: Tympanic membrane normal.     Left Ear: Tympanic membrane normal.     Nose: Nose normal. No congestion.     Mouth/Throat:     Mouth: Mucous membranes are moist.     Pharynx: Oropharynx is clear. No oropharyngeal exudate or posterior oropharyngeal erythema.  Neck:     Vascular: No carotid bruit.  Cardiovascular:     Rate and Rhythm: Normal rate and regular rhythm.     Pulses: Normal pulses.     Heart sounds: Normal heart sounds. No murmur heard. Pulmonary:      Effort: Pulmonary effort is normal. No respiratory distress.     Breath sounds: Normal breath sounds. No rhonchi or rales.  Chest:     Chest wall: No tenderness.  Abdominal:     General: Bowel sounds are normal. There is no distension.     Palpations: Abdomen is soft.     Tenderness: There is no right CVA tenderness, left CVA tenderness, guarding or rebound.  Musculoskeletal:     Cervical back: Normal range of motion and neck supple.  Lymphadenopathy:     Cervical: No cervical adenopathy.  Skin:    Coloration: Skin is not jaundiced.  Neurological:     General: No focal deficit present.     Mental Status: He is alert and oriented to person, place, and time.  Psychiatric:        Mood and Affect: Mood normal.        Behavior: Behavior normal.        Assessment & Plan:  Patient advised to continue his current medications.  He will continue to use his PEG tube for Osmolite but will also be eating small soft meals until he has his GI appointment.   Will repeat his labs in 2-week.   Monitor weight. Problem List Items Addressed This Visit     Acute alcoholic pancreatitis   Relevant Medications   famotidine (PEPCID) 20 MG tablet   lactulose (CHRONULAC) 10 GM/15ML solution   Alcohol abuse   Relevant Medications   thiamine (VITAMIN B-1) 100 MG tablet   Hyponatremia   Relevant Medications   furosemide (LASIX) 20 MG tablet   GERD (gastroesophageal reflux disease) - Primary   Relevant Medications   famotidine (PEPCID) 20 MG tablet   simethicone (MYLICON) 40 MG/0.6ML drops   lactulose (CHRONULAC) 10 GM/15ML solution   Other Visit Diagnoses     GAD (generalized anxiety disorder)       Relevant Medications   QUEtiapine (SEROQUEL) 50 MG tablet      Follow up 2 weeks.  Total time spent: 45 minutes  Margaretann Loveless, MD  12/22/2022  This document may have been prepared by [employer] Voice Recognition software and as such may include unintentional dictation errors.

## 2022-12-26 ENCOUNTER — Emergency Department
Admission: EM | Admit: 2022-12-26 | Discharge: 2022-12-26 | Disposition: A | Discharge status: Home / Self Care | Payer: Medicaid Other | Attending: Student in an Organized Health Care Education/Training Program | Admitting: Student in an Organized Health Care Education/Training Program

## 2022-12-26 ENCOUNTER — Other Ambulatory Visit: Payer: Self-pay

## 2022-12-26 ENCOUNTER — Emergency Department: Payer: Medicaid Other

## 2022-12-26 DIAGNOSIS — K861 Other chronic pancreatitis: Secondary | ICD-10-CM | POA: Insufficient documentation

## 2022-12-26 DIAGNOSIS — R1013 Epigastric pain: Secondary | ICD-10-CM

## 2022-12-26 DIAGNOSIS — R109 Unspecified abdominal pain: Secondary | ICD-10-CM | POA: Diagnosis present

## 2022-12-26 DIAGNOSIS — K863 Pseudocyst of pancreas: Secondary | ICD-10-CM | POA: Diagnosis not present

## 2022-12-26 LAB — CBC
HCT: 39.7 % (ref 39.0–52.0)
Hemoglobin: 13.4 g/dL (ref 13.0–17.0)
MCH: 31.4 pg (ref 26.0–34.0)
MCHC: 33.8 g/dL (ref 30.0–36.0)
MCV: 93 fL (ref 80.0–100.0)
Platelets: 221 10*3/uL (ref 150–400)
RBC: 4.27 MIL/uL (ref 4.22–5.81)
RDW: 13.4 % (ref 11.5–15.5)
WBC: 9.8 10*3/uL (ref 4.0–10.5)
nRBC: 0 % (ref 0.0–0.2)

## 2022-12-26 LAB — COMPREHENSIVE METABOLIC PANEL
ALT: 23 U/L (ref 0–44)
AST: 40 U/L (ref 15–41)
Albumin: 2.7 g/dL — ABNORMAL LOW (ref 3.5–5.0)
Alkaline Phosphatase: 111 U/L (ref 38–126)
Anion gap: 8 (ref 5–15)
BUN: 12 mg/dL (ref 6–20)
CO2: 25 mmol/L (ref 22–32)
Calcium: 8.5 mg/dL — ABNORMAL LOW (ref 8.9–10.3)
Chloride: 101 mmol/L (ref 98–111)
Creatinine, Ser: 0.56 mg/dL — ABNORMAL LOW (ref 0.61–1.24)
GFR, Estimated: >60 mL/min (ref >60)
Glucose, Bld: 104 mg/dL — ABNORMAL HIGH (ref 70–99)
Potassium: 3.7 mmol/L (ref 3.5–5.1)
Sodium: 134 mmol/L — ABNORMAL LOW (ref 135–145)
Total Bilirubin: 0.4 mg/dL (ref 0.3–1.2)
Total Protein: 8.6 g/dL — ABNORMAL HIGH (ref 6.5–8.1)

## 2022-12-26 LAB — LIPASE, BLOOD: Lipase: 556 U/L — ABNORMAL HIGH (ref 11–51)

## 2022-12-26 MED ORDER — DIATRIZOATE MEGLUMINE & SODIUM 66-10 % PO SOLN
30.0000 mL | Freq: Once | ORAL | Status: AC
  Administered 2022-12-26: 30 mL via ORAL

## 2022-12-26 MED ORDER — MORPHINE SULFATE (PF) 4 MG/ML IV SOLN
4.0000 mg | INTRAVENOUS | Status: DC | PRN
  Administered 2022-12-26: 4 mg via INTRAVENOUS
  Filled 2022-12-26: qty 1

## 2022-12-26 MED ORDER — ONDANSETRON HCL 4 MG/2ML IJ SOLN
4.0000 mg | Freq: Once | INTRAMUSCULAR | Status: AC
  Administered 2022-12-26: 4 mg via INTRAVENOUS
  Filled 2022-12-26: qty 2

## 2022-12-26 MED ORDER — SODIUM CHLORIDE 0.9 % IV BOLUS
500.0000 mL | Freq: Once | INTRAVENOUS | Status: AC
  Administered 2022-12-26: 500 mL via INTRAVENOUS

## 2022-12-26 MED ORDER — IOHEXOL 300 MG/ML  SOLN
100.0000 mL | Freq: Once | INTRAMUSCULAR | Status: AC | PRN
  Administered 2022-12-26: 100 mL via INTRAVENOUS

## 2022-12-26 MED ORDER — HYDROCODONE-ACETAMINOPHEN 7.5-325 MG/15ML PO SOLN: 10.0000 mL | Freq: Four times a day (QID) | ORAL | 0 refills | Status: DC | PRN

## 2022-12-26 NOTE — ED Triage Notes (Signed)
Pt comes with c/o belly pain that started Saturday. Pt also has g-tube that is bleeding. Pt stats he noticed it yesterday.   Pt states pain when coughing or trying to move.

## 2022-12-26 NOTE — ED Provider Notes (Signed)
[facility] Provider Note    Event Date/Time   First MD Initiated Contact with Patient 12/26/22 1502     (approximate)   History   Abdominal Pain and g-tube bleeding   HPI  Luke Rogers. is a 62 y.o. male extensive past medical history with recent admission to the hospital secondary to significant alcohol abuse pancreatitis protein calorie malnutrition, hep C status post PEG tube placement presents to the ER for evaluation noting some bleeding from the PEG tube insertion site and some pain whenever he is receiving tube feeds.  No fevers.  No vomiting.  Denies any dysuria.     Physical Exam   Triage Vital Signs: ED Triage Vitals [12/26/22 1437]  Enc Vitals Group     BP 122/80     Pulse Rate (!) 106     Resp 18     Temp 98 F (36.7 C)     Temp src      SpO2 97 %     Weight      Height      Head Circumference      Peak Flow      Pain Score 7     Pain Loc      Pain Edu?      Excl. in GC?     Most recent vital signs: Vitals:   12/26/22 1437 12/26/22 1839  BP: 122/80 120/78  Pulse: (!) 106 96  Resp: 18 18  Temp: 98 F (36.7 C) 98 F (36.7 C)  SpO2: 97% 96%     Constitutional: Alert  Eyes: Conjunctivae are normal.  Head: Atraumatic. Nose: No congestion/rhinnorhea. Mouth/Throat: Mucous membranes are moist.   Neck: Painless ROM.  Cardiovascular:   Good peripheral circulation. Respiratory: Normal respiratory effort.  No retractions.  Gastrointestinal: With normal-appearing PEG tube insertion site no bleeding, fluctuance or cellulitic changes. Musculoskeletal:  no deformity Neurologic:  MAE spontaneously. No gross focal neurologic deficits are appreciated.  Skin:  Skin is warm, dry and intact. No rash noted. Psychiatric: Mood and affect are normal. Speech and behavior are normal.    ED Results / Procedures / Treatments   Labs (all labs ordered are listed, but only abnormal results are displayed) Labs Reviewed  LIPASE,  BLOOD - Abnormal; Notable for the following components:      Result Value   Lipase 556 (*)    All other components within normal limits  COMPREHENSIVE METABOLIC PANEL - Abnormal; Notable for the following components:   Sodium 134 (*)    Glucose, Bld 104 (*)    Creatinine, Ser 0.56 (*)    Calcium 8.5 (*)    Total Protein 8.6 (*)    Albumin 2.7 (*)    All other components within normal limits  CBC  URINALYSIS, ROUTINE W REFLEX MICROSCOPIC        RADIOLOGY Please see ED Course for my review and interpretation.  I personally reviewed all radiographic images ordered to evaluate for the above acute complaints and reviewed radiology reports and findings.  These findings were personally discussed with the patient.  Please see medical record for radiology report.    PROCEDURES:  Critical Care performed:   Procedures   MEDICATIONS ORDERED IN ED: Medications  morphine (PF) 4 MG/ML injection 4 mg (4 mg Intravenous Given 12/26/22 1629)  sodium chloride 0.9 % bolus 500 mL (500 mLs Intravenous New Bag/Given 12/26/22 1629)  ondansetron (ZOFRAN) injection 4 mg (4 mg Intravenous Given 12/26/22 1629)  diatrizoate meglumine-sodium (GASTROGRAFIN) 66-10 % solution 30 mL (30 mLs Oral Given 12/26/22 1602)  iohexol (OMNIPAQUE) 300 MG/ML solution 100 mL (100 mLs Intravenous Contrast Given 12/26/22 1721)     IMPRESSION / MDM / ASSESSMENT AND PLAN / ED COURSE  I reviewed the triage vital signs and the nursing notes.                              Differential diagnosis includes, but is not limited to, PEG tube displacement, bleeding from ostomy, cellulitis, abscess, electrolyte abnormality, pancreatitis, perforation, pneumonia  Patient presenting to the ER for evaluation of symptoms as described above.  Based on symptoms, risk factors and considered above differential, this presenting complaint could reflect a potentially life-threatening illness therefore the patient will be placed on continuous pulse  oximetry and telemetry for monitoring.  Laboratory evaluation will be sent to evaluate for the above complaints.      Clinical Course as of 12/26/22 1844  Tue Dec 26, 2022  1614 KUB with contrast going to the stomach showing adequate placement.  Patient's lipase is elevated as compared to previous.  Concern for possible pancreatitis. [PR]  1840 CT imaging with large pseudocyst.  Patient's pain free at this time.  No sign of obstruction no sign of infected pancreatitis.  I do suspect much of his symptoms are related to overfeeding with the tube feeds and having some intolerance to this therefore we have suggested decreasing the amount and increasing the frequency to see if he feels better with this.  Discussed option for admission for fluids and pain control.  States he would prefer discharge home as he spent quite a time in the hospital and overall is feeling fairly well and just wanted to make sure that the PEG tube was not having malfunction.  At this point do believe he is stable and appropriate for outpatient follow-up.  Discussed return precautions. 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    Willy Eddy, MD 12/26/22 [phone]

## 2023-01-02 ENCOUNTER — Encounter: Payer: Self-pay | Admitting: *Deleted

## 2023-01-02 ENCOUNTER — Emergency Department
Admission: EM | Admit: 2023-01-02 | Discharge: 2023-01-03 | Disposition: A | Discharge status: Home / Self Care | Payer: Medicaid Other | Attending: Emergency Medicine | Admitting: Emergency Medicine

## 2023-01-02 ENCOUNTER — Other Ambulatory Visit: Payer: Self-pay

## 2023-01-02 DIAGNOSIS — K859 Acute pancreatitis without necrosis or infection, unspecified: Secondary | ICD-10-CM

## 2023-01-02 DIAGNOSIS — R1013 Epigastric pain: Secondary | ICD-10-CM | POA: Diagnosis present

## 2023-01-02 LAB — LIPASE, BLOOD: Lipase: 834 U/L — ABNORMAL HIGH (ref 11–51)

## 2023-01-02 LAB — COMPREHENSIVE METABOLIC PANEL
ALT: 23 U/L (ref 0–44)
AST: 39 U/L (ref 15–41)
Albumin: 2.9 g/dL — ABNORMAL LOW (ref 3.5–5.0)
Alkaline Phosphatase: 109 U/L (ref 38–126)
Anion gap: 8 (ref 5–15)
BUN: 9 mg/dL (ref 6–20)
CO2: 27 mmol/L (ref 22–32)
Calcium: 8.7 mg/dL — ABNORMAL LOW (ref 8.9–10.3)
Chloride: 101 mmol/L (ref 98–111)
Creatinine, Ser: 0.64 mg/dL (ref 0.61–1.24)
GFR, Estimated: >60 mL/min (ref >60)
Glucose, Bld: 120 mg/dL — ABNORMAL HIGH (ref 70–99)
Potassium: 4 mmol/L (ref 3.5–5.1)
Sodium: 136 mmol/L (ref 135–145)
Total Bilirubin: 0.6 mg/dL (ref 0.3–1.2)
Total Protein: 8.9 g/dL — ABNORMAL HIGH (ref 6.5–8.1)

## 2023-01-02 LAB — CBC
HCT: 40.7 % (ref 39.0–52.0)
Hemoglobin: 13.5 g/dL (ref 13.0–17.0)
MCH: 30.3 pg (ref 26.0–34.0)
MCHC: 33.2 g/dL (ref 30.0–36.0)
MCV: 91.5 fL (ref 80.0–100.0)
Platelets: 254 10*3/uL (ref 150–400)
RBC: 4.45 MIL/uL (ref 4.22–5.81)
RDW: 13.5 % (ref 11.5–15.5)
WBC: 8.5 10*3/uL (ref 4.0–10.5)
nRBC: 0 % (ref 0.0–0.2)

## 2023-01-02 MED ORDER — MORPHINE SULFATE (PF) 4 MG/ML IV SOLN
4.0000 mg | Freq: Once | INTRAVENOUS | Status: AC
  Administered 2023-01-02: 4 mg via INTRAVENOUS
  Filled 2023-01-02: qty 1

## 2023-01-02 MED ORDER — ONDANSETRON HCL 4 MG/2ML IJ SOLN
4.0000 mg | Freq: Once | INTRAMUSCULAR | Status: AC
  Administered 2023-01-02: 4 mg via INTRAVENOUS
  Filled 2023-01-02: qty 2

## 2023-01-02 MED ORDER — SODIUM CHLORIDE 0.9 % IV SOLN: Freq: Once | INTRAVENOUS | Status: AC

## 2023-01-02 NOTE — ED Provider Notes (Signed)
[facility] Provider Note    Event Date/Time   First MD Initiated Contact with Patient 01/02/23 2135     (approximate)   History   Abdominal Pain   HPI  Luke Rogers. is a 62 y.o. male with a history of pancreatitis who presents with complaints of epigastric abdominal pain.  He states this feels like his typical pancreatitis.  Medical records reviewed, patient had prolonged admission, discharged on May 28 at that time he had PEG tube placed secondary to chronic pancreatitis.  He reports he has not been using the PEG tube and would like it removed   Physical Exam   Triage Vital Signs: ED Triage Vitals  Enc Vitals Group     BP 01/02/23 2027 (!) 123/91     Pulse Rate 01/02/23 2027 (!) 107     Resp 01/02/23 2027 (!) 30     Temp 01/02/23 2027 98.4 F (36.9 C)     Temp Source 01/02/23 2027 Oral     SpO2 01/02/23 2027 98 %     Weight 01/02/23 2028 65.8 kg (145 lb)     Height 01/02/23 2028 1.88 m (6\' 2" )     Head Circumference --      Peak Flow --      Pain Score 01/02/23 2028 10     Pain Loc --      Pain Edu? --      Excl. in GC? --     Most recent vital signs: Vitals:   01/02/23 2027 01/02/23 2230  BP: (!) 123/91 125/86  Pulse: (!) 107 73  Resp: (!) 30 18  Temp: 98.4 F (36.9 C)   SpO2: 98% 94%     General: Awake, no distress.  CV:  Good peripheral perfusion.  Resp:  Normal effort.  Abd:  No distention.  PEG tube in appropriate position, no evidence of infection, mild epigastric tenderness Other:     ED Results / Procedures / Treatments   Labs (all labs ordered are listed, but only abnormal results are displayed) Labs Reviewed  LIPASE, BLOOD - Abnormal; Notable for the following components:      Result Value   Lipase 834 (*)    All other components within normal limits  COMPREHENSIVE METABOLIC PANEL - Abnormal; Notable for the following components:   Glucose, Bld 120 (*)    Calcium 8.7 (*)    Total Protein 8.9 (*)     Albumin 2.9 (*)    All other components within normal limits  CBC  URINALYSIS, ROUTINE W REFLEX MICROSCOPIC     EKG     RADIOLOGY     PROCEDURES:  Critical Care performed:   Procedures   MEDICATIONS ORDERED IN ED: Medications  morphine (PF) 4 MG/ML injection 4 mg (has no administration in time range)  0.9 %  sodium chloride infusion ( Intravenous New Bag/Given 01/02/23 2226)  morphine (PF) 4 MG/ML injection 4 mg (4 mg Intravenous Given 01/02/23 2226)  ondansetron (ZOFRAN) injection 4 mg (4 mg Intravenous Given 01/02/23 2226)     IMPRESSION / MDM / ASSESSMENT AND PLAN / ED COURSE  I reviewed the triage vital signs and the nursing notes. Patient's presentation is most consistent with exacerbation of chronic illness.  Patient presents with epigastric discomfort likely related to pancreatitis but he also had a PEG tube placed relatively recently he is concerned about possible infection however PEG tube exam is reassuring, no signs of infection  Will treat with  IV morphine, IV Zofran, IV fluids  Lipase is elevated to 800, increased from 556 7 days ago  He would prefer to be discharged if possible, will reevaluate after treatment ----------------------------------------- 10:56 PM on 01/02/2023 ----------------------------------------- Second dose of pain medication given, have asked my colleague to follow-up and reevaluate to determine if admission is required       FINAL CLINICAL IMPRESSION(S) / ED DIAGNOSES   Final diagnoses:  Acute pancreatitis, unspecified complication status, unspecified pancreatitis type     Rx / DC Orders   ED Discharge Orders     None        Note:  This document was prepared using Dragon voice recognition software and may include unintentional dictation errors.   Jene Every, MD 01/02/23 2257

## 2023-01-02 NOTE — ED Triage Notes (Addendum)
Pt to triage via wheelchair.  Pt has abd pain.  Pt has a feeding tube in place x 2-3 weeks.   Pt reports pain around tube site.  Pt has hx chronic pancreatitis.  Pt alert.

## 2023-01-03 MED ORDER — ONDANSETRON 4 MG PO TBDP: 4.0000 mg | ORAL_TABLET | Freq: Three times a day (TID) | ORAL | 0 refills | Status: DC | PRN

## 2023-01-03 MED ORDER — OXYCODONE HCL 5 MG PO TABS: 5.0000 mg | ORAL_TABLET | ORAL | 0 refills | Status: DC | PRN

## 2023-01-03 MED ORDER — OXYCODONE HCL 5 MG PO TABS
5.0000 mg | ORAL_TABLET | Freq: Once | ORAL | Status: AC
  Administered 2023-01-03: 5 mg via ORAL
  Filled 2023-01-03: qty 1

## 2023-01-03 NOTE — ED Notes (Signed)
Pt A&O x4, no obvious distress noted, respirations regular/unlabored. Pt verbalizes understanding of discharge instructions. Pt able to ambulate from ED independently.   

## 2023-01-03 NOTE — Discharge Instructions (Signed)
You may take medicines as needed for pain and nausea (Oxycodone/Zofran #20).  Follow a bland eating diet for the next week, then slowly advance diet as tolerated.  Return to the ER for worsening symptoms, persistent vomiting, difficulty breathing or concerns.

## 2023-01-03 NOTE — ED Provider Notes (Signed)
-----------------------------------------   12:27 AM on 01/03/2023 -----------------------------------------   Pain significantly improved after Morphine.  Will discharge home with as needed oxycodone and Zofran.  Encourage patient to follow a strict bland diet.  Strict return precautions given.  Patient and spouse verbalized understanding agree with plan of care.   Irean Hong, MD 01/03/23 [phone]

## 2023-01-05 ENCOUNTER — Ambulatory Visit: Payer: Medicaid Other | Admitting: Internal Medicine

## 2023-01-05 ENCOUNTER — Encounter: Payer: Self-pay | Admitting: Internal Medicine

## 2023-01-05 VITALS — BP 106/58 | HR 94 | Ht 74.0 in | Wt 150.4 lb

## 2023-01-05 DIAGNOSIS — F17200 Nicotine dependence, unspecified, uncomplicated: Secondary | ICD-10-CM

## 2023-01-05 DIAGNOSIS — K219 Gastro-esophageal reflux disease without esophagitis: Secondary | ICD-10-CM | POA: Diagnosis not present

## 2023-01-05 DIAGNOSIS — K852 Alcohol induced acute pancreatitis without necrosis or infection: Secondary | ICD-10-CM

## 2023-01-05 DIAGNOSIS — E43 Unspecified severe protein-calorie malnutrition: Secondary | ICD-10-CM | POA: Diagnosis not present

## 2023-01-05 NOTE — Progress Notes (Signed)
Established Patient Office Visit  Subjective:  Patient ID: Luke Faler., male    DOB: Nov 04, 1960  Age: 62 y.o. MRN: [id number]  Chief Complaint  Patient presents with   Follow-up    2 week follow up    Patient comes in for his follow-up visit today.  He was here 10 days ago to establish primary care.  But since then he has been to the ED twice with his recurrent acute alcoholic pancreatitis.  Patient has a PEG tube but he says he is actually eating more now and is not using tube feedings at all.  He has gained some weight.  But he does report of some abdominal bloating.  Today he is not complaining of any abdominal pain, no nausea and no vomiting.  He is trying to follow advised by eating a bland diet.  He has an appointment with the GI for the PEG tube removal. He continues to smoke cigarettes.   No other concerns at this time.   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   Pancreatitis     Past Surgical History:  Procedure Laterality Date   FOOT SURGERY Bilateral    As infant   INSERTION OF MESH Bilateral 09/25/2022   Procedure: INSERTION OF MESH;  Surgeon: Campbell Lerner, MD;  Location: [facility] ORS;  Service: General;  Laterality: Bilateral;   PEG PLACEMENT N/A 12/08/2022   Procedure: PERCUTANEOUS ENDOSCOPIC GASTROSTOMY (PEG) PLACEMENT;  Surgeon: Toledo, Boykin Nearing, MD;  Location: [facility] ENDOSCOPY;  Service: Gastroenterology;  Laterality: N/A;   WRIST SURGERY Right     Social History   Socioeconomic History   Marital status: Single    Spouse name: Not on file   Number of children: Not on file   Years of education: Not on file   Highest education level: Not on file  Occupational History   Not on file  Tobacco Use   Smoking status: Every Day    Packs/day: .5    Types: Cigarettes    Passive exposure:  Past   Smokeless tobacco: Never  Vaping Use   Vaping Use: Never used  Substance and Sexual Activity   Alcohol use: Not Currently    Alcohol/week: 63.0 standard drinks of alcohol    Types: 63 Cans of beer per week   Drug use: Yes    Types: Amphetamines   Sexual activity: Yes  Other Topics Concern   Not on file  Social History Narrative   Not on file   Social Determinants of Health   Financial Resource Strain: Not on file  Food Insecurity: No Food Insecurity (11/23/2022)   Hunger Vital Sign    Worried About Running Out of Food in the Last Year: Never true    Ran Out of Food in the Last Year: Never true  Transportation Needs: No Transportation Needs (11/23/2022)   PRAPARE - Administrator, Civil Service (Medical): No    Lack of Transportation (Non-Medical): No  Physical Activity: Not on file  Stress: Not on file  Social Connections: Not on file  Intimate Partner Violence: Not At Risk (11/23/2022)   Humiliation, Afraid, Rape, and Kick questionnaire    Fear of Current or Ex-Partner: No    Emotionally Abused: No    Physically Abused:  No    Sexually Abused: No    Family History  Problem Relation Age of Onset   Hypertension Mother     Allergies  Allergen Reactions   Tramadol Nausea And Vomiting    Review of Systems  Constitutional:  Negative for chills, diaphoresis, fever, malaise/fatigue and weight loss.  HENT:  Negative for ear pain, hearing loss, sinus pain and sore throat.   Eyes: Negative.   Respiratory:  Negative for cough, hemoptysis, sputum production and wheezing.   Cardiovascular:  Negative for chest pain, palpitations, leg swelling and PND.  Gastrointestinal:  Negative for abdominal pain, blood in stool, constipation, heartburn, melena, nausea and vomiting.  Genitourinary: Negative.   Musculoskeletal:  Negative for joint pain, myalgias and neck pain.  Neurological:  Negative for dizziness, seizures, weakness and headaches.  Psychiatric/Behavioral:   Negative for depression and memory loss. The patient is not nervous/anxious and does not have insomnia.        Objective:   BP (!) 106/58   Pulse 94   Ht 6\' 2"  (1.88 m)   Wt 150 lb 6.4 oz (68.2 kg)   SpO2 94%   BMI 19.31 kg/m   Vitals:   01/05/23 1138  BP: (!) 106/58  Pulse: 94  Height: 6\' 2"  (1.88 m)  Weight: 150 lb 6.4 oz (68.2 kg)  SpO2: 94%  BMI (Calculated): 19.3    Physical Exam Vitals and nursing note reviewed.  Constitutional:      Appearance: Normal appearance.  Cardiovascular:     Rate and Rhythm: Normal rate.     Pulses: Normal pulses.     Heart sounds: Normal heart sounds.  Pulmonary:     Effort: Pulmonary effort is normal.     Breath sounds: Normal breath sounds. No wheezing or rales.  Abdominal:     General: Bowel sounds are normal. There is distension.     Palpations: There is no mass.     Tenderness: There is no abdominal tenderness. There is no right CVA tenderness, left CVA tenderness or rebound.     Hernia: No hernia is present.  Musculoskeletal:     Cervical back: Normal range of motion and neck supple.     Right lower leg: No edema.     Left lower leg: No edema.  Neurological:     General: No focal deficit present.     Mental Status: He is alert.  Psychiatric:        Mood and Affect: Mood normal.        Behavior: Behavior normal.     No results found for any visits on 01/05/23.      Assessment & Plan:  Patient advised to continue taking his current meds. He will get his PEG tube removed at his GI appointment.  Can continue his oral diet but needs to be careful with the types of food he is eating. Problem List Items Addressed This Visit     Acute alcoholic pancreatitis - Primary   Nicotine dependence   GERD (gastroesophageal reflux disease)   Protein-calorie malnutrition, severe    Return in about 3 months (around 04/07/2023).   Total time spent: 25 minutes  Margaretann Loveless, MD  01/05/2023   This document may have been  prepared by [facility] Voice Recognition software and as such may include unintentional dictation errors.

## 2023-01-08 ENCOUNTER — Telehealth: Payer: Self-pay | Admitting: Internal Medicine

## 2023-01-08 NOTE — Telephone Encounter (Signed)
Cheryl left VM that patient is out of his oxycodone and is in pain. Wanted to know if Dr. Welton Flakes will write him a prescription for this.

## 2023-01-09 ENCOUNTER — Other Ambulatory Visit: Payer: Self-pay

## 2023-01-09 ENCOUNTER — Other Ambulatory Visit: Payer: Self-pay | Admitting: Internal Medicine

## 2023-01-09 ENCOUNTER — Encounter: Payer: Self-pay | Admitting: Internal Medicine

## 2023-01-09 ENCOUNTER — Emergency Department: Payer: Medicaid Other

## 2023-01-09 ENCOUNTER — Inpatient Hospital Stay
Admission: EM | Admit: 2023-01-09 | Discharge: 2023-01-11 | DRG: 438 | Disposition: A | Discharge status: Home / Self Care | Payer: Medicaid Other | Attending: Obstetrics and Gynecology | Admitting: Obstetrics and Gynecology

## 2023-01-09 DIAGNOSIS — F17210 Nicotine dependence, cigarettes, uncomplicated: Secondary | ICD-10-CM | POA: Diagnosis present

## 2023-01-09 DIAGNOSIS — B1920 Unspecified viral hepatitis C without hepatic coma: Secondary | ICD-10-CM | POA: Diagnosis present

## 2023-01-09 DIAGNOSIS — Z931 Gastrostomy status: Secondary | ICD-10-CM

## 2023-01-09 DIAGNOSIS — B182 Chronic viral hepatitis C: Secondary | ICD-10-CM | POA: Diagnosis present

## 2023-01-09 DIAGNOSIS — K859 Acute pancreatitis without necrosis or infection, unspecified: Secondary | ICD-10-CM | POA: Diagnosis present

## 2023-01-09 DIAGNOSIS — Z8249 Family history of ischemic heart disease and other diseases of the circulatory system: Secondary | ICD-10-CM

## 2023-01-09 DIAGNOSIS — K219 Gastro-esophageal reflux disease without esophagitis: Secondary | ICD-10-CM | POA: Diagnosis present

## 2023-01-09 DIAGNOSIS — E43 Unspecified severe protein-calorie malnutrition: Secondary | ICD-10-CM | POA: Diagnosis present

## 2023-01-09 DIAGNOSIS — K7031 Alcoholic cirrhosis of liver with ascites: Secondary | ICD-10-CM | POA: Diagnosis present

## 2023-01-09 DIAGNOSIS — F1010 Alcohol abuse, uncomplicated: Secondary | ICD-10-CM | POA: Diagnosis present

## 2023-01-09 DIAGNOSIS — F1510 Other stimulant abuse, uncomplicated: Secondary | ICD-10-CM | POA: Diagnosis not present

## 2023-01-09 DIAGNOSIS — R599 Enlarged lymph nodes, unspecified: Secondary | ICD-10-CM | POA: Diagnosis present

## 2023-01-09 DIAGNOSIS — R197 Diarrhea, unspecified: Secondary | ICD-10-CM | POA: Diagnosis present

## 2023-01-09 DIAGNOSIS — Z720 Tobacco use: Secondary | ICD-10-CM | POA: Diagnosis present

## 2023-01-09 DIAGNOSIS — F1910 Other psychoactive substance abuse, uncomplicated: Secondary | ICD-10-CM | POA: Diagnosis present

## 2023-01-09 DIAGNOSIS — F419 Anxiety disorder, unspecified: Secondary | ICD-10-CM | POA: Diagnosis present

## 2023-01-09 DIAGNOSIS — Z885 Allergy status to narcotic agent status: Secondary | ICD-10-CM

## 2023-01-09 DIAGNOSIS — K852 Alcohol induced acute pancreatitis without necrosis or infection: Principal | ICD-10-CM | POA: Diagnosis present

## 2023-01-09 DIAGNOSIS — F418 Other specified anxiety disorders: Secondary | ICD-10-CM | POA: Diagnosis present

## 2023-01-09 DIAGNOSIS — F17200 Nicotine dependence, unspecified, uncomplicated: Secondary | ICD-10-CM | POA: Diagnosis present

## 2023-01-09 DIAGNOSIS — K8689 Other specified diseases of pancreas: Secondary | ICD-10-CM | POA: Diagnosis present

## 2023-01-09 DIAGNOSIS — Z681 Body mass index (BMI) 19 or less, adult: Secondary | ICD-10-CM

## 2023-01-09 DIAGNOSIS — K449 Diaphragmatic hernia without obstruction or gangrene: Secondary | ICD-10-CM | POA: Diagnosis present

## 2023-01-09 DIAGNOSIS — F32A Depression, unspecified: Secondary | ICD-10-CM | POA: Diagnosis present

## 2023-01-09 HISTORY — DX: Gastrostomy status: Z93.1

## 2023-01-09 LAB — CBC
HCT: 36.2 % — ABNORMAL LOW (ref 39.0–52.0)
Hemoglobin: 12.2 g/dL — ABNORMAL LOW (ref 13.0–17.0)
MCH: 30.5 pg (ref 26.0–34.0)
MCHC: 33.7 g/dL (ref 30.0–36.0)
MCV: 90.5 fL (ref 80.0–100.0)
Platelets: 258 10*3/uL (ref 150–400)
RBC: 4 MIL/uL — ABNORMAL LOW (ref 4.22–5.81)
RDW: 13.5 % (ref 11.5–15.5)
WBC: 8 10*3/uL (ref 4.0–10.5)
nRBC: 0 % (ref 0.0–0.2)

## 2023-01-09 LAB — URINALYSIS, ROUTINE W REFLEX MICROSCOPIC
Bilirubin Urine: NEGATIVE
Glucose, UA: NEGATIVE mg/dL
Hgb urine dipstick: NEGATIVE
Ketones, ur: NEGATIVE mg/dL
Leukocytes,Ua: NEGATIVE
Nitrite: NEGATIVE
Protein, ur: NEGATIVE mg/dL
Specific Gravity, Urine: 1.036 — ABNORMAL HIGH (ref 1.005–1.030)
pH: 5 (ref 5.0–8.0)

## 2023-01-09 LAB — TRIGLYCERIDES: Triglycerides: 50 mg/dL (ref <150)

## 2023-01-09 LAB — COMPREHENSIVE METABOLIC PANEL
ALT: 14 U/L (ref 0–44)
AST: 29 U/L (ref 15–41)
Albumin: 2.8 g/dL — ABNORMAL LOW (ref 3.5–5.0)
Alkaline Phosphatase: 91 U/L (ref 38–126)
Anion gap: 8 (ref 5–15)
BUN: 11 mg/dL (ref 6–20)
CO2: 24 mmol/L (ref 22–32)
Calcium: 8.5 mg/dL — ABNORMAL LOW (ref 8.9–10.3)
Chloride: 105 mmol/L (ref 98–111)
Creatinine, Ser: 0.52 mg/dL — ABNORMAL LOW (ref 0.61–1.24)
GFR, Estimated: >60 mL/min (ref >60)
Glucose, Bld: 120 mg/dL — ABNORMAL HIGH (ref 70–99)
Potassium: 3.6 mmol/L (ref 3.5–5.1)
Sodium: 137 mmol/L (ref 135–145)
Total Bilirubin: 0.8 mg/dL (ref 0.3–1.2)
Total Protein: 8.6 g/dL — ABNORMAL HIGH (ref 6.5–8.1)

## 2023-01-09 LAB — C DIFFICILE QUICK SCREEN W PCR REFLEX
C Diff antigen: NEGATIVE
C Diff interpretation: NOT DETECTED
C Diff toxin: NEGATIVE

## 2023-01-09 LAB — PHOSPHORUS: Phosphorus: 4.4 mg/dL (ref 2.5–4.6)

## 2023-01-09 LAB — MAGNESIUM: Magnesium: 1.6 mg/dL — ABNORMAL LOW (ref 1.7–2.4)

## 2023-01-09 LAB — AMMONIA: Ammonia: 17 umol/L (ref 9–35)

## 2023-01-09 LAB — LIPASE, BLOOD: Lipase: 661 U/L — ABNORMAL HIGH (ref 11–51)

## 2023-01-09 MED ORDER — HYDROMORPHONE HCL 1 MG/ML IJ SOLN
1.0000 mg | Freq: Once | INTRAMUSCULAR | Status: AC
  Administered 2023-01-09: 1 mg via INTRAVENOUS
  Filled 2023-01-09: qty 1

## 2023-01-09 MED ORDER — SODIUM CHLORIDE 0.9 % IV BOLUS
1000.0000 mL | Freq: Once | INTRAVENOUS | Status: AC
  Administered 2023-01-09: 1000 mL via INTRAVENOUS

## 2023-01-09 MED ORDER — POTASSIUM PHOSPHATES 15 MMOLE/5ML IV SOLN: 30.0000 mmol | Freq: Once | INTRAVENOUS | Status: DC

## 2023-01-09 MED ORDER — QUETIAPINE FUMARATE 25 MG PO TABS
50.0000 mg | ORAL_TABLET | Freq: Every day | ORAL | Status: DC
  Administered 2023-01-09: 50 mg
  Filled 2023-01-09: qty 2

## 2023-01-09 MED ORDER — PANCRELIPASE (LIP-PROT-AMYL) 12000-38000 UNITS PO CPEP
24000.0000 [IU] | ORAL_CAPSULE | Freq: Three times a day (TID) | ORAL | Status: DC
  Administered 2023-01-10 – 2023-01-11 (×4): 24000 [IU] via ORAL
  Filled 2023-01-09 (×7): qty 2

## 2023-01-09 MED ORDER — FOLIC ACID 1 MG PO TABS
1.0000 mg | ORAL_TABLET | Freq: Every day | ORAL | Status: DC
  Administered 2023-01-09 – 2023-01-10 (×2): 1 mg
  Filled 2023-01-09 (×2): qty 1

## 2023-01-09 MED ORDER — POTASSIUM PHOSPHATES 15 MMOLE/5ML IV SOLN: 15.0000 mmol | Freq: Once | INTRAVENOUS | Status: DC

## 2023-01-09 MED ORDER — ACETAMINOPHEN 325 MG PO TABS: 650.0000 mg | ORAL_TABLET | Freq: Four times a day (QID) | ORAL | Status: DC | PRN

## 2023-01-09 MED ORDER — OXYCODONE HCL 5 MG PO TABS
5.0000 mg | ORAL_TABLET | ORAL | Status: DC | PRN
  Administered 2023-01-09 – 2023-01-10 (×2): 5 mg
  Filled 2023-01-09 (×2): qty 1

## 2023-01-09 MED ORDER — SODIUM CHLORIDE 0.9 % IV SOLN: INTRAVENOUS | Status: DC

## 2023-01-09 MED ORDER — ONDANSETRON HCL 4 MG/2ML IJ SOLN
4.0000 mg | Freq: Once | INTRAMUSCULAR | Status: AC
  Administered 2023-01-09: 4 mg via INTRAVENOUS
  Filled 2023-01-09: qty 2

## 2023-01-09 MED ORDER — SIMETHICONE 40 MG/0.6ML PO SUSP: 80.0000 mg | Freq: Four times a day (QID) | ORAL | Status: DC | PRN

## 2023-01-09 MED ORDER — MAGNESIUM SULFATE 2 GM/50ML IV SOLN
2.0000 g | Freq: Once | INTRAVENOUS | Status: AC
  Administered 2023-01-09: 2 g via INTRAVENOUS
  Filled 2023-01-09: qty 50

## 2023-01-09 MED ORDER — NICOTINE 21 MG/24HR TD PT24
21.0000 mg | MEDICATED_PATCH | Freq: Every day | TRANSDERMAL | Status: DC
  Administered 2023-01-09 – 2023-01-10 (×2): 21 mg via TRANSDERMAL
  Filled 2023-01-09 (×3): qty 1

## 2023-01-09 MED ORDER — MORPHINE SULFATE (PF) 4 MG/ML IV SOLN
4.0000 mg | Freq: Once | INTRAVENOUS | Status: AC
  Administered 2023-01-09: 4 mg via INTRAVENOUS
  Filled 2023-01-09: qty 1

## 2023-01-09 MED ORDER — THIAMINE MONONITRATE 100 MG PO TABS
100.0000 mg | ORAL_TABLET | Freq: Every day | ORAL | Status: DC
  Administered 2023-01-09 – 2023-01-11 (×3): 100 mg
  Filled 2023-01-09 (×3): qty 1

## 2023-01-09 MED ORDER — PANTOPRAZOLE SODIUM 40 MG PO TBEC
40.0000 mg | DELAYED_RELEASE_TABLET | Freq: Every day | ORAL | Status: DC
  Administered 2023-01-09 – 2023-01-10 (×2): 40 mg via ORAL
  Filled 2023-01-09 (×2): qty 1

## 2023-01-09 MED ORDER — POLYETHYLENE GLYCOL 3350 17 G PO PACK: 17.0000 g | PACK | Freq: Every day | ORAL | Status: DC | PRN

## 2023-01-09 MED ORDER — CELECOXIB 200 MG PO CAPS
ORAL_CAPSULE | ORAL | 0 refills | Status: DC
Start: 2023-01-09 — End: 2023-01-09

## 2023-01-09 MED ORDER — FREE WATER
80.0000 mL | Freq: Every day | Status: DC
  Administered 2023-01-09 – 2023-01-10 (×4): 80 mL

## 2023-01-09 MED ORDER — OMEPRAZOLE 2 MG/ML ORAL SUSPENSION: 20.0000 mg | Freq: Every day | ORAL | Status: DC

## 2023-01-09 MED ORDER — OXYCODONE HCL 5 MG PO TABS
5.0000 mg | ORAL_TABLET | Freq: Four times a day (QID) | ORAL | Status: DC | PRN
  Administered 2023-01-09: 5 mg
  Filled 2023-01-09: qty 1

## 2023-01-09 MED ORDER — IOHEXOL 300 MG/ML  SOLN
100.0000 mL | Freq: Once | INTRAMUSCULAR | Status: AC | PRN
  Administered 2023-01-09: 100 mL via INTRAVENOUS

## 2023-01-09 MED ORDER — ENOXAPARIN SODIUM 40 MG/0.4ML IJ SOSY
40.0000 mg | PREFILLED_SYRINGE | INTRAMUSCULAR | Status: DC
  Administered 2023-01-09 – 2023-01-10 (×2): 40 mg via SUBCUTANEOUS
  Filled 2023-01-09 (×2): qty 0.4

## 2023-01-09 MED ORDER — ONDANSETRON HCL 4 MG/2ML IJ SOLN: 4.0000 mg | Freq: Three times a day (TID) | INTRAMUSCULAR | Status: DC | PRN

## 2023-01-09 MED ORDER — OXYCODONE HCL 5 MG PO TABS: 5.0000 mg | ORAL_TABLET | Freq: Four times a day (QID) | ORAL | Status: DC | PRN

## 2023-01-09 NOTE — H&P (Signed)
History and Physical    Luke Rogers. MVH:[id number] DOB: [birth year] DOA: 01/09/2023  Referring MD/NP/PA:   PCP: Margaretann Loveless, MD   Patient coming from:  The patient is coming from home.     Chief Complaint: Abdominal pain  HPI: Luke Rogers. is a 62 y.o. male with medical history significant of alcohol abuse, depression with anxiety, GERD, hiatal hernia, HCV, alcoholic liver cirrhosis, alcoholic pancreatitis, s/p of PEG tube placement, who presents with abdominal pain.  Patient was recently hospitalized from 5/2 - 5/18 due to alcohol withdrawal, acute pancreatitis secondary to alcohol abuse> he had PEG tube placed during that admission due to inability to tolerate oral intake. Pt states that he has not been using the PEG tube since returning home. He has been eating through mouth, without chocking episode. He states that he has abdominal pain in the past several days, which is located in the central abdomen around PEG tube.  The pain is constant, aching, moderate to severe, nonradiating.  It is not aggravated or alleviated by any known factors.  He reports drainage around PEG tube, but on my examination there is no surrounding erythema or pus drainage.  He denies nausea or vomiting.  He states that he had 7 watery diarrhea yesterday.  No fever or chills.  Denies symptoms of UTI.  Denies chest pain, cough, shortness of breath.  Patient states he did not drink alcohol after he went home from hospital.  Data reviewed independently and ED Course: pt was found to have lipase 661, normal liver function, WBC 8.0, GFR> 60, temperature normal, blood pressure 119/78, heart rate 103, 73, RR 18, oxygen saturation 97% on room air.  Patient is placed on MedSurg bed patient  CT abdomen/pelvis: Overall appearance is similar to prior.  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 Adjacent ill-defined fluid and stranding. A few of the smaller collections near the  stomach margin are decreased. In addition there is stable wall thickening of the splenic flexure with loculated fluid along its margin, similar to previous.   Evidence of chronic liver disease with a nodular liver. Ascites and varices.   Moderate hiatal hernia.   Stable enlarged lymph node along the gastrohepatic ligament.   G-tube.     EKG: Not done in ED, will get one.      Review of Systems:   General: no fevers, chills, no body weight gain, has poor appetite, has fatigue HEENT: no blurry vision, hearing changes or sore throat Respiratory: no dyspnea, coughing, wheezing CV: no chest pain, no palpitations GI: no nausea, vomiting, has abdominal pain, diarrhea, no constipation GU: no dysuria, burning on urination, increased urinary frequency, hematuria  Ext: no leg edema Neuro: no unilateral weakness, numbness, or tingling, no vision change or hearing loss Skin: no rash, no skin tear. MSK: No muscle spasm, no deformity, no limitation of range of movement in spin Heme: No easy bruising.  Travel history: No recent long distant travel.   Allergy:  Allergies  Allergen Reactions   Tramadol Nausea And Vomiting    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   Pancreatitis     Past Surgical History:  Procedure Laterality Date   FOOT SURGERY Bilateral    As infant  INSERTION OF MESH Bilateral 09/25/2022   Procedure: INSERTION OF MESH;  Surgeon: Campbell Lerner, MD;  Location: [facility] ORS;  Service: General;  Laterality: Bilateral;   PEG PLACEMENT N/A 12/08/2022   Procedure: PERCUTANEOUS ENDOSCOPIC GASTROSTOMY (PEG) PLACEMENT;  Surgeon: Toledo, Boykin Nearing, MD;  Location: [facility] ENDOSCOPY;  Service: Gastroenterology;  Laterality: N/A;   WRIST SURGERY Right     Social History:  reports that he has been smoking  cigarettes. He has been smoking an average of .5 packs per day. He has been exposed to tobacco smoke. He has never used smokeless tobacco. He reports that he does not currently use alcohol after a past usage of about 63.0 standard drinks of alcohol per week. He reports current drug use. Drug: Amphetamines.  Family History:  Family History  Problem Relation Age of Onset   Hypertension Mother      Prior to Admission medications   Medication Sig Start Date End Date Taking? Authorizing Provider  acetaminophen (TYLENOL) 325 MG tablet Place 2 tablets (650 mg total) into feeding tube every 6 (six) hours as needed for mild pain (or Fever >/= 101). 12/19/22   Loyce Dys, MD  famotidine (PEPCID) 20 MG tablet Place 1 tablet (20 mg total) into feeding tube daily. 12/22/22   Margaretann Loveless, MD  folic acid (FOLVITE) 1 MG tablet Place 1 tablet (1 mg total) into feeding tube daily. 12/22/22   Margaretann Loveless, MD  furosemide (LASIX) 20 MG tablet Place 1 tablet (20 mg total) into feeding tube daily. 12/22/22   Margaretann Loveless, MD  HYDROcodone-acetaminophen (HYCET) 7.5-325 mg/15 ml solution Take 10 mLs by mouth every 6 (six) hours as needed for moderate pain. Patient not taking: Reported on 01/05/2023 12/26/22 12/26/23  Willy Eddy, MD  lactulose (CHRONULAC) 10 GM/15ML solution Take 30 mLs (20 g total) by mouth 2 (two) times daily as needed for severe constipation. 12/22/22   Margaretann Loveless, MD  Nutritional Supplements (FEEDING SUPPLEMENT, OSMOLITE 1.5 CAL,) LIQD Place 237 mLs into feeding tube 6 (six) times daily. 12/19/22   Loyce Dys, MD  ondansetron (ZOFRAN-ODT) 4 MG disintegrating tablet Take 1 tablet (4 mg total) by mouth every 8 (eight) hours as needed for nausea or vomiting. 01/03/23   Irean Hong, MD  oxyCODONE (ROXICODONE) 5 MG immediate release tablet Take 1 tablet (5 mg total) by mouth every 4 (four) hours as needed for severe pain. 01/03/23   Irean Hong, MD  polyethylene glycol (MIRALAX / GLYCOLAX)  17 g packet Place 17 g into feeding tube 2 (two) times daily. 12/19/22   Loyce Dys, MD  QUEtiapine (SEROQUEL) 50 MG tablet Place 1 tablet (50 mg total) into feeding tube at bedtime. 12/22/22   Margaretann Loveless, MD  simethicone (MYLICON) 40 MG/0.6ML drops Place 1.2 mLs (80 mg total) into feeding tube every 6 (six) hours as needed for flatulence. 12/22/22   Margaretann Loveless, MD  spironolactone (ALDACTONE) 25 MG tablet Place 0.5 tablets (12.5 mg total) into feeding tube 2 (two) times daily. 12/19/22   Loyce Dys, MD  thiamine (VITAMIN B-1) 100 MG tablet Place 1 tablet (100 mg total) into feeding tube daily. 12/22/22   Margaretann Loveless, MD  Water For Irrigation, Sterile (FREE WATER) SOLN Place 80 mLs into feeding tube 6 (six) times daily. 12/19/22   Loyce Dys, MD    Physical Exam: Vitals:   01/09/23 0809 01/09/23 0900 01/09/23 1125  BP: 113/79 119/78 106/82  Pulse: Marland Kitchen)  103 73 78  Resp: 18  18  Temp: 98.2 F (36.8 C)  97.9 F (36.6 C)  TempSrc:   Oral  SpO2: 100% 97% 100%  Weight: 68 kg    Height: 6\' 2"  (1.88 m)     General: Not in acute distress.  Thin body habitus HEENT:       Eyes: PERRL, EOMI, no jaundice       ENT: No discharge from the ears and nose, no pharynx injection, no tonsillar enlargement.        Neck: No JVD, no bruit, no mass felt. Heme: No neck lymph node enlargement. Cardiac: S1/S2, RRR, No murmurs, No gallops or rubs. Respiratory: No rales, wheezing, rhonchi or rubs. GI: mildly distended, has tenderness in central abdomen, no pus drainage or surrounding erythema around PEG tube, no rebound pain, no organomegaly, BS present. GU: No hematuria Ext: No pitting leg edema bilaterally. 1+DP/PT pulse bilaterally. Musculoskeletal: No joint deformities, No joint redness or warmth, no limitation of ROM in spin. Skin: No rashes.  Neuro: Alert, oriented X3, cranial nerves II-XII grossly intact, moves all extremities normally.  Psych: Patient is not psychotic, no suicidal or  hemocidal ideation.  Labs on Admission: I have personally reviewed following labs and imaging studies  CBC: Recent Labs  Lab 01/02/23 2029 01/09/23 0811  WBC 8.5 8.0  HGB 13.5 12.2*  HCT 40.7 36.2*  MCV 91.5 90.5  PLT 254 258   Basic Metabolic Panel: Recent Labs  Lab 01/02/23 2029 01/09/23 0811  NA 136 137  K 4.0 3.6  CL 101 105  CO2 27 24  GLUCOSE 120* 120*  BUN 9 11  CREATININE 0.64 0.52*  CALCIUM 8.7* 8.5*  MG  --  1.6*  PHOS  --  4.4   GFR: Estimated Creatinine Clearance: 95.6 mL/min (A) (by C-G formula based on SCr of 0.52 mg/dL (L)). Liver Function Tests: Recent Labs  Lab 01/02/23 2029 01/09/23 0811  AST 39 29  ALT 23 14  ALKPHOS 109 91  BILITOT 0.6 0.8  PROT 8.9* 8.6*  ALBUMIN 2.9* 2.8*   Recent Labs  Lab 01/02/23 2029 01/09/23 0811  LIPASE 834* 661*   No results for input(s): "AMMONIA" in the last 168 hours. Coagulation Profile: No results for input(s): "INR", "PROTIME" in the last 168 hours. Cardiac Enzymes: No results for input(s): "CKTOTAL", "CKMB", "CKMBINDEX", "TROPONINI" in the last 168 hours. BNP (last 3 results) No results for input(s): "PROBNP" in the last 8760 hours. HbA1C: No results for input(s): "HGBA1C" in the last 72 hours. CBG: No results for input(s): "GLUCAP" in the last 168 hours. Lipid Profile: Recent Labs    01/09/23 0811  TRIG 50   Thyroid Function Tests: No results for input(s): "TSH", "T4TOTAL", "FREET4", "T3FREE", "THYROIDAB" in the last 72 hours. Anemia Panel: No results for input(s): "VITAMINB12", "FOLATE", "FERRITIN", "TIBC", "IRON", "RETICCTPCT" in the last 72 hours. Urine analysis:    Component Value Date/Time   COLORURINE AMBER (A) 11/22/2022 1941   APPEARANCEUR HAZY (A) 11/22/2022 1941   LABSPEC 1.027 11/22/2022 1941   PHURINE 5.0 11/22/2022 1941   GLUCOSEU NEGATIVE 11/22/2022 1941   HGBUR NEGATIVE 11/22/2022 1941   BILIRUBINUR NEGATIVE 11/22/2022 1941   KETONESUR NEGATIVE 11/22/2022 1941    PROTEINUR NEGATIVE 11/22/2022 1941   NITRITE NEGATIVE 11/22/2022 1941   LEUKOCYTESUR NEGATIVE 11/22/2022 1941   Sepsis Labs: @LABRCNTIP (procalcitonin:4,lacticidven:4) )No results found for this or any previous visit (from the past 240 hour(s)).   Radiological Exams on Admission: CT ABDOMEN PELVIS  W CONTRAST  Result Date: 01/09/2023 CLINICAL DATA:  Severe pancreatitis EXAM: CT ABDOMEN AND PELVIS WITH CONTRAST TECHNIQUE: Multidetector CT imaging of the abdomen and pelvis was performed using the standard protocol following bolus administration of intravenous contrast. RADIATION DOSE REDUCTION: This exam was performed according to the departmental dose-optimization program which includes automated exposure control, adjustment of the mA and/or kV according to patient size and/or use of iterative reconstruction technique. CONTRAST:  OMNIPAQUE IOHEXOL 300 MG/ML  SOLN COMPARISON:  CT 12/26/2022 and older FINDINGS: Lower chest: There is some linear opacity lung bases likely scar or atelectasis. No pleural effusion. Large hiatal hernia. Fluid tracks along the mediastinum. Hepatobiliary: Nodular fatty liver. Please correlate for any history of chronic liver disease. The gallbladder is dilated. Patent portal vein. There are some tiny sub 5 mm cystic foci in the liver in segment 4, too small to completely characterize but likely benign cysts. No specific imaging follow-up. Pancreas: Global pancreatic atrophy with some areas of mild pancreatic ductal dilatation. There is particular focal parenchymal loss in the neck of the pancreas where there is cystic lesion. Previously this measured 6.6 x 5.8 cm and today 6.2 by 5.2 cm. Probable evolving pseudocysts. There is also a cystic focus in the tail of the pancreas which previously measured 3.3 x 2.1 cm and today on series 2, image 27 measures 3.0 by 2.4 cm. Significant surrounding stranding identified, similar to previous. Additional subtle area of loculated fluid  extending along the lesser sac, posterior wall of the stomach. These are decreasing. Spleen: Normal in size without focal abnormality. Adrenals/Urinary Tract: Adrenal glands are preserved. No enhancing renal mass or collecting system dilatation. The ureters have normal course and caliber down to the normal caliber urinary bladder. There is several benign Bosniak 1 and 2 renal cysts bilaterally. There is 1 lesion from the upper pole left kidney which is exophytic which is more complex however this exophytic lesion on series 2, image 22 measures 2.2 by 1.6 cm. Hounsfield units of 72 on portal venous phase, 79 on delayed. Previously this has been described as a Bosniak 2 proteinaceous or hemorrhagic lesion. No specific imaging follow-up. Stomach/Bowel: No oral contrast. This is a G-tube in the stomach. Stomach has some mild luminal fluid and air. Small and large bowel are nondilated. Scattered colonic stool. Normal appendix in the right lower quadrant. Stable areas of fold thickening along the splenic flexure of the colon with adjacent fluid, similar to previous. Probable adjacent additional pseudocysts. Vascular/Lymphatic: Normal caliber aorta and IVC. Scattered vascular calcifications along the aorta and branch vessels. Prominent but nonpathologic upper abdominal retroperitoneal nodes are identified. There is 1 larger lymph node near the gastrohepatic ligament measuring 2.3 by 1.4 cm. This is unchanged from previous examinations. Reproductive: Prostate is unremarkable. Other: Scattered ascites, similar to the recent prior examination. No free air. Musculoskeletal: Mild curvature and degenerative changes along the spine. IMPRESSION: Overall appearance is similar to prior. 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 Adjacent ill-defined fluid and stranding. A few of the smaller collections near the stomach margin are decreased. In addition there is stable wall thickening of the splenic flexure  with loculated fluid along its margin, similar to previous. Evidence of chronic liver disease with a nodular liver. Ascites and varices. Moderate hiatal hernia. Stable enlarged lymph node along the gastrohepatic ligament. G-tube. Electronically Signed   By: Karen Kays M.D.   On: 01/09/2023 10:18      Assessment/Plan Principal Problem:   Acute  recurrent pancreatitis Active Problems:   Diarrhea   GERD (gastroesophageal reflux disease)   Status post insertion of percutaneous endoscopic gastrostomy (PEG) tube (HCC)   Alcoholic cirrhosis of liver with ascites (HCC)   Alcohol abuse   Tobacco abuse   Polysubstance abuse (HCC)   Amphetamine abuse (HCC)   Depression with anxiety   Protein-calorie malnutrition, severe   Hypomagnesemia   Assessment and Plan:  Acute recurrent pancreatitis: Lipase 661. CT showed pancreatic atrophy with presumed pseudocysts in the neck and tail. Pt states that he quit drinking alcohol since recent discharge from hospital.   -will place in med-surg bed for observation -NPO for pancreatitis -IVF: 1LNS and then at 100 cc/hr -prn percocet and tyleno for pain control -prn IV zofran for nausea -check triglyceride level --> 50  Diarrhea: -check C. Difficile -Start Creon empirically for possible pancreatic insufficiency  GERD (gastroesophageal reflux disease) -Protonix  Status post insertion of percutaneous endoscopic gastrostomy (PEG) tube [facility]): no signs of infection. -observe   Alcoholic cirrhosis of liver with ascites (HCC): -check ammonia level -hold Lasix and spironolactone since patient needs IV fluid for pancreatitis.  Polysubstance abuse: Alcohol, tobacco, amphetamine -Did counseling about importance of quitting substance use -Patient stopped drinking alcohol -Nicotine patch  Depression with anxiety -Continue home medications  Protein-calorie malnutrition, severe: Body weight 68 kg, BMI 19.96 -Nutrition consult -need to start nutrition  supplement when patient is able to eat  Hypomagnesemia: Magnesium 1.6, potassium 3.6, phosphorus of 4.4 -Repleted mag with 2 g magnesium sulfate    DVT ppx: SQ Lovenox  Code Status: Full code     Family Communication:     Yes, patient's wife    at bed side.     Disposition Plan:  Anticipate discharge back to previous environment  Consults called:  none  Admission status and Level of care: Med-Surg:  for obs    Dispo: The patient is from: Home              Anticipated d/c is to: Home              Anticipated d/c date is: 1 day              Patient currently is not medically stable to d/c.    Severity of Illness:  The appropriate patient status for this patient is OBSERVATION. Observation status is judged to be reasonable and necessary in order to provide the required intensity of service to ensure the patient's safety. The patient's presenting symptoms, physical exam findings, and initial radiographic and laboratory data in the context of their medical condition is felt to place them at decreased risk for further clinical deterioration. Furthermore, it is anticipated that the patient will be medically stable for discharge from the hospital within 2 midnights of admission.        Date of Service 01/09/2023    Lorretta Harp [facility]   If 7PM-7AM, please contact night-coverage [email or web address] 01/09/2023, 12:42 PM

## 2023-01-09 NOTE — ED Triage Notes (Signed)
Pt to ED for lower abd pain and pain around PEG tube, was seen last week for same and given pain meds. States ran out of pain meds and PCP told to take tylenol.  +diarrhea.  Hx pancreatitis.  Reports redness around peg tube.

## 2023-01-09 NOTE — Progress Notes (Signed)
Initial Nutrition Assessment  DOCUMENTATION CODES:   Severe malnutrition in context of chronic illness  INTERVENTION:   Ensure Enlive po TID with diet advancement, each supplement provides 350 kcal and 20 grams of protein.  MVI, folic acid and thiamine po daily   Pt at refeed risk; recommend monitor potassium, magnesium and phosphorus labs daily until stable  Daily weights   NUTRITION DIAGNOSIS:   Severe Malnutrition related to chronic illness (recurrent pancreatitis, substance abuse) as evidenced by severe muscle depletion, severe fat depletion.  GOAL:   Patient will meet greater than or equal to 90% of their needs  MONITOR:   Diet advancement, Labs, Weight trends, I & O's, Skin  REASON FOR ASSESSMENT:   Consult Assessment of nutrition requirement/status  ASSESSMENT:   62 y/o male with h/o etoh abuse, substance abuse, hepatitis C, GERD, hiatal hernia, cirrhosis with ascites/varices and recent prolonged admission for pancreatitis with pseudocyst requiring PEG tube 5/17 and who is now admitted with recurrent pancreatitis.  Met with pt in room today. Pt is well known to this RD from a recent previous admission. Pt reports good appetite and oral intake since leaving the hospital. Pt previously discharged on a pureed diet but reports that he has been eating a regular consistency diet since discharge. Pt reports that he has been drinking carnation instant breakfast at home. Pt reports that he has not been using his PEG tube for feedings since he discharged. Pt reports that he was unable to tolerate the Osmolite related to early satiety; pt reports that he has ten cases of unused Osmolite at home. Pt is scheduled to have the PEG tube removed next week. Pt currently NPO for bowel rest. RD will add supplements once pt's diet is advanced. Pt prefers chocolate Ensure.   Pt with diarrhea that started the day before admission. Pt initiated on creon for possible EPI.   Medications  reviewed and include: lovenox, folic acid, creon, nicotine, protonix, thiamine, NaCl @100ml /hr   Labs reviewed: K 3.6 wnl, creat 0.52(L), P 4.4 wnl, Mg 1.6(L) Lipase- 661(H), ammonia 17 Cbgs- 146, 131, 91, 84 x 24 hrs   NUTRITION - FOCUSED PHYSICAL EXAM:  Flowsheet Row Most Recent Value  Orbital Region Moderate depletion  Upper Arm Region Severe depletion  Thoracic and Lumbar Region Severe depletion  Buccal Region Moderate depletion  Temple Region Moderate depletion  Clavicle Bone Region Severe depletion  Clavicle and Acromion Bone Region Severe depletion  Scapular Bone Region Moderate depletion  Dorsal Hand Moderate depletion  Patellar Region Severe depletion  Anterior Thigh Region Severe depletion  Posterior Calf Region Severe depletion  Edema (RD Assessment) None  Hair Reviewed  Eyes Reviewed  Mouth Reviewed  Skin Reviewed  Nails Reviewed   Diet Order:   Diet Order             Diet NPO time specified Except for: Ice Chips  Diet effective now                  EDUCATION NEEDS:   Education needs have been addressed  Skin:  Skin Assessment: Reviewed RN Assessment (ecchymosis)  Last BM:  6/18- type 7  Height:   Ht Readings from Last 1 Encounters:  01/09/23 6\' 2"  (1.88 m)    Weight:   Wt Readings from Last 1 Encounters:  01/09/23 68.4 kg   BMI:  Body mass index is 19.36 kg/m.  Estimated Nutritional Needs:   Kcal:  2100-2400kcal/day  Protein:  105-120g/day  Fluid:  2.1-2.4L/day  Tristen Luce MS, RD, LDN Please refer to AMION for RD and/or RD on-call/weekend/after hours pager  

## 2023-01-09 NOTE — ED Provider Notes (Signed)
[facility] Provider Note    Event Date/Time   First MD Initiated Contact with Patient 01/09/23 [phone]     (approximate)   History   Chief Complaint Abdominal Pain   HPI  Luke Rogers. is a 62 y.o. male with past medical history of alcohol abuse, pancreatitis, hep C, amphetamine abuse, and anxiety who presents to the ED complaining of abdominal pain.  Patient reports that he has had increasing pain across both sides of his upper abdomen for about the past week and a half.  He was recently admitted to the hospital for acute pancreatitis secondary to alcohol abuse, had PEG tube placed during that admission due to inability to tolerate oral intake.  He states he has not been using the PEG tube since returning home, but reports significant irritation around the PEG tube site.  He states he has had purulent drainage from around the tube and it is very uncomfortable with any movement.  He was seen in the ED for pancreatitis since discharge from the hospital, states he was taking pain medication with some relief but has since run out.  Over the past 2 days, he has had worsening nausea, has not vomited but states that he has been unable to eat without severe pain.  He denies any fevers, dysuria, or flank pain.  Patient denies any alcohol consumption since his admission to the hospital.     Physical Exam   Triage Vital Signs: ED Triage Vitals [01/09/23 0809]  Enc Vitals Group     BP 113/79     Pulse Rate (!) 103     Resp 18     Temp 98.2 F (36.8 C)     Temp src      SpO2 100 %     Weight 150 lb (68 kg)     Height 6\' 2"  (1.88 m)     Head Circumference      Peak Flow      Pain Score 10     Pain Loc      Pain Edu?      Excl. in GC?     Most recent vital signs: Vitals:   01/09/23 0809 01/09/23 0900  BP: 113/79 119/78  Pulse: (!) 103 73  Resp: 18   Temp: 98.2 F (36.8 C)   SpO2: 100% 97%    Constitutional: Alert and oriented. Eyes: Conjunctivae  are normal. Head: Atraumatic. Nose: No congestion/rhinnorhea. Mouth/Throat: Mucous membranes are moist.  Cardiovascular: Normal rate, regular rhythm. Grossly normal heart sounds.  2+ radial pulses bilaterally. Respiratory: Normal respiratory effort.  No retractions. Lungs CTAB. Gastrointestinal: Soft and diffusely tender to palpation, greatest in the bilateral upper quadrants and epigastrium.  PEG tube in place with mild erythema around the site, no edema, induration, or purulent drainage noted. Musculoskeletal: No lower extremity tenderness nor edema.  Neurologic:  Normal speech and language. No gross focal neurologic deficits are appreciated.    ED Results / Procedures / Treatments   Labs (all labs ordered are listed, but only abnormal results are displayed) Labs Reviewed  LIPASE, BLOOD - Abnormal; Notable for the following components:      Result Value   Lipase 661 (*)    All other components within normal limits  COMPREHENSIVE METABOLIC PANEL - Abnormal; Notable for the following components:   Glucose, Bld 120 (*)    Creatinine, Ser 0.52 (*)    Calcium 8.5 (*)    Total Protein 8.6 (*)  Albumin 2.8 (*)    All other components within normal limits  CBC - Abnormal; Notable for the following components:   RBC 4.00 (*)    Hemoglobin 12.2 (*)    HCT 36.2 (*)    All other components within normal limits  MAGNESIUM - Abnormal; Notable for the following components:   Magnesium 1.6 (*)    All other components within normal limits  PHOSPHORUS  URINALYSIS, ROUTINE W REFLEX MICROSCOPIC   RADIOLOGY CT abdomen/pelvis reviewed and interpreted by me with inflammatory changes in the area of the pancreas with associated pseudocyst.  PROCEDURES:  Critical Care performed: No  Procedures   MEDICATIONS ORDERED IN ED: Medications  oxyCODONE (Oxy IR/ROXICODONE) immediate release tablet 5 mg (has no administration in time range)  ondansetron (ZOFRAN) injection 4 mg (has no  administration in time range)  acetaminophen (TYLENOL) tablet 650 mg (has no administration in time range)  0.9 %  sodium chloride infusion (has no administration in time range)  nicotine (NICODERM CQ - dosed in mg/24 hours) patch 21 mg (has no administration in time range)  enoxaparin (LOVENOX) injection 40 mg (has no administration in time range)  morphine (PF) 4 MG/ML injection 4 mg (4 mg Intravenous Given 01/09/23 0832)  ondansetron (ZOFRAN) injection 4 mg (4 mg Intravenous Given 01/09/23 0832)  sodium chloride 0.9 % bolus 1,000 mL (0 mLs Intravenous Stopped 01/09/23 1040)  iohexol (OMNIPAQUE) 300 MG/ML solution 100 mL (100 mLs Intravenous Contrast Given 01/09/23 0925)     IMPRESSION / MDM / ASSESSMENT AND PLAN / ED COURSE  I reviewed the triage vital signs and the nursing notes.                              62 y.o. male with past medical history of alcohol abuse, pancreatitis, hepatitis C, amphetamine use, and anxiety who presents to the ED complaining of ongoing and increasing pain to the bilateral upper quadrants of his abdomen along with irritation at his PEG tube site.  Patient's presentation is most consistent with acute presentation with potential threat to life or bodily function.  Differential diagnosis includes, but is not limited to, pancreatitis, hepatitis, cholecystitis, biliary colic, gastritis, PEG tube site infection, intra-abdominal abscess.  Patient uncomfortable appearing but nontoxic and in no acute distress, vital signs remarkable for tachycardia but otherwise reassuring.  His abdomen is soft but he does have diffuse tenderness, greatest in the bilateral upper quadrants and epigastrium.  No obvious signs of infection around his PEG tube site but patient reports purulent drainage and we will further assess with CT imaging for deeper associated infection.  Patient also noted to have significantly elevated lipase on past 2 ED visits and symptoms may be due worsening  pancreatitis, LFTs and lipase are pending at this time.  We will treat symptomatically with IV morphine and Zofran, hydrate with IV fluids and reassess.  Labs without significant anemia, leukocytosis, or AKI.  Patient does have hypomagnesemia which we will replete.  LFTs are unremarkable but patient continues to have significant elevation in his lipase.  CT imaging similar to previous with signs of pancreatitis, pseudocysts similar to previous.  Patient with improvement in pain on reassessment, but given this is his third visit for similar symptoms and is now having difficulty tolerating oral intake, case discussed with hospitalist for admission.      FINAL CLINICAL IMPRESSION(S) / ED DIAGNOSES   Final diagnoses:  Alcohol-induced acute pancreatitis without infection  or necrosis     Rx / DC Orders   ED Discharge Orders     None        Note:  This document was prepared using Dragon voice recognition software and may include unintentional dictation errors.   Chesley Noon, MD 01/09/23 [phone]

## 2023-01-10 DIAGNOSIS — B1920 Unspecified viral hepatitis C without hepatic coma: Secondary | ICD-10-CM | POA: Diagnosis present

## 2023-01-10 DIAGNOSIS — F17210 Nicotine dependence, cigarettes, uncomplicated: Secondary | ICD-10-CM | POA: Diagnosis present

## 2023-01-10 DIAGNOSIS — F1510 Other stimulant abuse, uncomplicated: Secondary | ICD-10-CM | POA: Diagnosis present

## 2023-01-10 DIAGNOSIS — K852 Alcohol induced acute pancreatitis without necrosis or infection: Secondary | ICD-10-CM | POA: Diagnosis present

## 2023-01-10 DIAGNOSIS — Z8249 Family history of ischemic heart disease and other diseases of the circulatory system: Secondary | ICD-10-CM | POA: Diagnosis not present

## 2023-01-10 DIAGNOSIS — F419 Anxiety disorder, unspecified: Secondary | ICD-10-CM | POA: Diagnosis present

## 2023-01-10 DIAGNOSIS — F32A Depression, unspecified: Secondary | ICD-10-CM | POA: Diagnosis present

## 2023-01-10 DIAGNOSIS — R599 Enlarged lymph nodes, unspecified: Secondary | ICD-10-CM | POA: Diagnosis present

## 2023-01-10 DIAGNOSIS — Z681 Body mass index (BMI) 19 or less, adult: Secondary | ICD-10-CM | POA: Diagnosis not present

## 2023-01-10 DIAGNOSIS — K859 Acute pancreatitis without necrosis or infection, unspecified: Secondary | ICD-10-CM | POA: Diagnosis not present

## 2023-01-10 DIAGNOSIS — K7031 Alcoholic cirrhosis of liver with ascites: Secondary | ICD-10-CM | POA: Diagnosis present

## 2023-01-10 DIAGNOSIS — K449 Diaphragmatic hernia without obstruction or gangrene: Secondary | ICD-10-CM | POA: Diagnosis present

## 2023-01-10 DIAGNOSIS — K219 Gastro-esophageal reflux disease without esophagitis: Secondary | ICD-10-CM | POA: Diagnosis present

## 2023-01-10 DIAGNOSIS — R197 Diarrhea, unspecified: Secondary | ICD-10-CM | POA: Diagnosis present

## 2023-01-10 DIAGNOSIS — F1010 Alcohol abuse, uncomplicated: Secondary | ICD-10-CM | POA: Diagnosis present

## 2023-01-10 DIAGNOSIS — Z931 Gastrostomy status: Secondary | ICD-10-CM | POA: Diagnosis not present

## 2023-01-10 DIAGNOSIS — Z885 Allergy status to narcotic agent status: Secondary | ICD-10-CM | POA: Diagnosis not present

## 2023-01-10 DIAGNOSIS — K8689 Other specified diseases of pancreas: Secondary | ICD-10-CM | POA: Diagnosis present

## 2023-01-10 DIAGNOSIS — E43 Unspecified severe protein-calorie malnutrition: Secondary | ICD-10-CM | POA: Diagnosis present

## 2023-01-10 LAB — CBC
HCT: 32.6 % — ABNORMAL LOW (ref 39.0–52.0)
Hemoglobin: 10.9 g/dL — ABNORMAL LOW (ref 13.0–17.0)
MCH: 30.5 pg (ref 26.0–34.0)
MCHC: 33.4 g/dL (ref 30.0–36.0)
MCV: 91.3 fL (ref 80.0–100.0)
Platelets: 215 10*3/uL (ref 150–400)
RBC: 3.57 MIL/uL — ABNORMAL LOW (ref 4.22–5.81)
RDW: 13.6 % (ref 11.5–15.5)
WBC: 6.5 10*3/uL (ref 4.0–10.5)
nRBC: 0 % (ref 0.0–0.2)

## 2023-01-10 LAB — LIPASE, BLOOD: Lipase: 280 U/L — ABNORMAL HIGH (ref 11–51)

## 2023-01-10 LAB — BASIC METABOLIC PANEL
Anion gap: 8 (ref 5–15)
BUN: 8 mg/dL (ref 6–20)
CO2: 21 mmol/L — ABNORMAL LOW (ref 22–32)
Calcium: 7.9 mg/dL — ABNORMAL LOW (ref 8.9–10.3)
Chloride: 109 mmol/L (ref 98–111)
Creatinine, Ser: 0.53 mg/dL — ABNORMAL LOW (ref 0.61–1.24)
GFR, Estimated: >60 mL/min (ref >60)
Glucose, Bld: 83 mg/dL (ref 70–99)
Potassium: 3.4 mmol/L — ABNORMAL LOW (ref 3.5–5.1)
Sodium: 138 mmol/L (ref 135–145)

## 2023-01-10 LAB — GLUCOSE, CAPILLARY: Glucose-Capillary: 76 mg/dL (ref 70–99)

## 2023-01-10 MED ORDER — POTASSIUM CHLORIDE IN NACL 20-0.9 MEQ/L-% IV SOLN
INTRAVENOUS | Status: DC
  Filled 2023-01-10 (×5): qty 1000

## 2023-01-10 MED ORDER — QUETIAPINE FUMARATE 25 MG PO TABS
50.0000 mg | ORAL_TABLET | Freq: Every day | ORAL | Status: DC
  Administered 2023-01-10: 50 mg via ORAL
  Filled 2023-01-10: qty 2

## 2023-01-10 MED ORDER — FOLIC ACID 1 MG PO TABS
1.0000 mg | ORAL_TABLET | Freq: Every day | ORAL | Status: DC
  Administered 2023-01-11: 1 mg via ORAL
  Filled 2023-01-10: qty 1

## 2023-01-10 MED ORDER — OXYCODONE HCL 5 MG PO TABS
5.0000 mg | ORAL_TABLET | ORAL | Status: DC | PRN
  Administered 2023-01-10 (×2): 5 mg via ORAL
  Filled 2023-01-10 (×2): qty 1

## 2023-01-10 NOTE — Progress Notes (Signed)
PROGRESS NOTE    Luke Rogers.  UJW:[id number] DOB: February 05, 1961 DOA: 01/09/2023 PCP: Margaretann Loveless, MD     Brief Narrative:   From admission h and p  Luke Rogers. is a 62 y.o. male with medical history significant of alcohol abuse, depression with anxiety, GERD, hiatal hernia, HCV, alcoholic liver cirrhosis, alcoholic pancreatitis, s/p of PEG tube placement, who presents with abdominal pain.   Patient was recently hospitalized from 5/2 - 5/18 due to alcohol withdrawal, acute pancreatitis secondary to alcohol abuse> he had PEG tube placed during that admission due to inability to tolerate oral intake. Pt states that he has not been using the PEG tube since returning home. He has been eating through mouth, without chocking episode. He states that he has abdominal pain in the past several days, which is located in the central abdomen around PEG tube.  The pain is constant, aching, moderate to severe, nonradiating.  It is not aggravated or alleviated by any known factors.  He reports drainage around PEG tube, but on my examination there is no surrounding erythema or pus drainage.  He denies nausea or vomiting.  He states that he had 7 watery diarrhea yesterday.  No fever or chills.  Denies symptoms of UTI.  Denies chest pain, cough, shortness of breath.  Patient states he did not drink alcohol after he went home from hospital.  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 CT showed pancreatic atrophy with presumed pseudocysts in the neck and tail. Pt states that he quit drinking alcohol since recent discharge from hospital. This morning reports pain much improved, has appetite. Triglycerides wnl - start clears, adat - continue fluids and pain conrol  Diarrhea: C diff neg. None overnight -Started Creon empirically for possible pancreatic insufficiency   GERD (gastroesophageal reflux disease) -Protonix   Status post insertion of percutaneous endoscopic gastrostomy (PEG) tube [facility]): no signs of infection or misplacement. Has removal scheduled for later this month -observe    Alcoholic cirrhosis of liver with ascites (HCC): -hold Lasix and spironolactone since patient needs IV fluid for pancreatitis.   Polysubstance abuse: Alcohol, tobacco, amphetamine No signs withdrawal, says no alcohol since d/c last month - monitor - vitamins   Depression with anxiety -Continue home medications   Protein-calorie malnutrition, severe: Body weight 68 kg, BMI 19.96 -Nutrition consult -need to start nutrition supplement when patient is able to eat   DVT prophylaxis: lovenox Code Status: full Family Communication: none @ bedside  Level of care: Med-Surg Status is: Inpatient Remains inpatient appropriate because: severity of illness    Consultants:  none  Procedures: none  Antimicrobials:  none    Subjective: Reports abd pain much improved, has appetite, no diarrhea  Objective: Vitals:   01/09/23 1513 01/09/23 2008 01/10/23 0402 01/10/23 0803  BP:  110/72 101/68 97/62  Pulse:  75 87 79  Resp:  18 18 18   Temp:  98.4 F (36.9 C) 98.7 F (37.1 C) 99 F (  37.2 C)  TempSrc:  Oral Oral   SpO2:  97% 94% 95%  Weight: 68.4 kg     Height:        Intake/Output Summary (Last 24 hours) at 01/10/2023 0911 Last data filed at 01/10/2023 0658 Gross per 24 hour  Intake 2255.45 ml  Output --  Net 2255.45 ml   Filed Weights   01/09/23 0809 01/09/23  1513  Weight: 68 kg 68.4 kg    Examination:  General exam: Appears calm and comfortable  Respiratory system: Clear to auscultation. Respiratory effort normal. Cardiovascular system: S1 & S2 heard, RRR. No JVD, murmurs, rubs, gallops or clicks. No pedal edema. Gastrointestinal system: Abdomen is nondistended, soft and mild diffuse tenderness, g tube in place dressing c/d/i Central nervous system: Alert and oriented. No focal neurological deficits. Extremities: Symmetric 5 x 5 power. Skin: No rashes, lesions or ulcers Psychiatry: Judgement and insight appear normal. Mood & affect appropriate.     Data Reviewed: I have personally reviewed following labs and imaging studies  CBC: Recent Labs  Lab 01/09/23 0811 01/10/23 0705  WBC 8.0 6.5  HGB 12.2* 10.9*  HCT 36.2* 32.6*  MCV 90.5 91.3  PLT 258 215   Basic Metabolic Panel: Recent Labs  Lab 01/09/23 0811 01/10/23 0705  NA 137 138  K 3.6 3.4*  CL 105 109  CO2 24 21*  GLUCOSE 120* 83  BUN 11 8  CREATININE 0.52* 0.53*  CALCIUM 8.5* 7.9*  MG 1.6*  --   PHOS 4.4  --    GFR: Estimated Creatinine Clearance: 96.2 mL/min (A) (by C-G formula based on SCr of 0.53 mg/dL (L)). Liver Function Tests: Recent Labs  Lab 01/09/23 0811  AST 29  ALT 14  ALKPHOS 91  BILITOT 0.8  PROT 8.6*  ALBUMIN 2.8*   Recent Labs  Lab 01/09/23 0811 01/10/23 0705  LIPASE 661* 280*   Recent Labs  Lab 01/09/23 1418  AMMONIA 17   Coagulation Profile: No results for input(s): "INR", "PROTIME" in the last 168 hours. Cardiac Enzymes: No results for input(s): "CKTOTAL", "CKMB", "CKMBINDEX", "TROPONINI" in the last 168 hours. BNP (last 3 results) No results for input(s): "PROBNP" in the last 8760 hours. HbA1C: No results for input(s): "HGBA1C" in the last 72 hours. CBG: Recent Labs  Lab 01/10/23 0801  GLUCAP 76   Lipid Profile: Recent Labs    01/09/23 0811  TRIG 50   Thyroid Function Tests: No results for input(s): "TSH",  "T4TOTAL", "FREET4", "T3FREE", "THYROIDAB" in the last 72 hours. Anemia Panel: No results for input(s): "VITAMINB12", "FOLATE", "FERRITIN", "TIBC", "IRON", "RETICCTPCT" in the last 72 hours. Urine analysis:    Component Value Date/Time   COLORURINE YELLOW (A) 01/09/2023 1829   APPEARANCEUR CLEAR (A) 01/09/2023 1829   LABSPEC 1.036 (H) 01/09/2023 1829   PHURINE 5.0 01/09/2023 1829   GLUCOSEU NEGATIVE 01/09/2023 1829   HGBUR NEGATIVE 01/09/2023 1829   BILIRUBINUR NEGATIVE 01/09/2023 1829   KETONESUR NEGATIVE 01/09/2023 1829   PROTEINUR NEGATIVE 01/09/2023 1829   NITRITE NEGATIVE 01/09/2023 1829   LEUKOCYTESUR NEGATIVE 01/09/2023 1829   Sepsis Labs: @LABRCNTIP (procalcitonin:4,lacticidven:4)  ) Recent Results (from the past 240 hour(s))  C Difficile Quick Screen w PCR reflex     Status: None   Collection Time: 01/09/23  6:29 PM   Specimen: Urine, Clean Catch; Stool  Result Value Ref Range Status   C Diff antigen NEGATIVE NEGATIVE Final   C Diff toxin NEGATIVE NEGATIVE Final   C Diff interpretation No C. difficile  detected.  Final    Comment: Performed at [facility], [address] [place], [place] [address]         Radiology Studies: CT ABDOMEN PELVIS W CONTRAST  Result Date: 01/09/2023 CLINICAL DATA:  Severe pancreatitis EXAM: CT ABDOMEN AND PELVIS WITH CONTRAST TECHNIQUE: Multidetector CT imaging of the abdomen and pelvis was performed using the standard protocol following bolus administration of intravenous contrast. RADIATION DOSE REDUCTION: This exam was performed according to the departmental dose-optimization program which includes automated exposure control, adjustment of the mA and/or kV according to patient size and/or use of iterative reconstruction technique. CONTRAST:  OMNIPAQUE IOHEXOL 300 MG/ML  SOLN COMPARISON:  CT 12/26/2022 and older FINDINGS: Lower chest: There is some linear opacity lung bases likely scar or atelectasis. No pleural effusion.  Large hiatal hernia. Fluid tracks along the mediastinum. Hepatobiliary: Nodular fatty liver. Please correlate for any history of chronic liver disease. The gallbladder is dilated. Patent portal vein. There are some tiny sub 5 mm cystic foci in the liver in segment 4, too small to completely characterize but likely benign cysts. No specific imaging follow-up. Pancreas: Global pancreatic atrophy with some areas of mild pancreatic ductal dilatation. There is particular focal parenchymal loss in the neck of the pancreas where there is cystic lesion. Previously this measured 6.6 x 5.8 cm and today 6.2 by 5.2 cm. Probable evolving pseudocysts. There is also a cystic focus in the tail of the pancreas which previously measured 3.3 x 2.1 cm and today on series 2, image 27 measures 3.0 by 2.4 cm. Significant surrounding stranding identified, similar to previous. Additional subtle area of loculated fluid extending along the lesser sac, posterior wall of the stomach. These are decreasing. Spleen: Normal in size without focal abnormality. Adrenals/Urinary Tract: Adrenal glands are preserved. No enhancing renal mass or collecting system dilatation. The ureters have normal course and caliber down to the normal caliber urinary bladder. There is several benign Bosniak 1 and 2 renal cysts bilaterally. There is 1 lesion from the upper pole left kidney which is exophytic which is more complex however this exophytic lesion on series 2, image 22 measures 2.2 by 1.6 cm. Hounsfield units of 72 on portal venous phase, 79 on delayed. Previously this has been described as a Bosniak 2 proteinaceous or hemorrhagic lesion. No specific imaging follow-up. Stomach/Bowel: No oral contrast. This is a G-tube in the stomach. Stomach has some mild luminal fluid and air. Small and large bowel are nondilated. Scattered colonic stool. Normal appendix in the right lower quadrant. Stable areas of fold thickening along the splenic flexure of the colon with  adjacent fluid, similar to previous. Probable adjacent additional pseudocysts. Vascular/Lymphatic: Normal caliber aorta and IVC. Scattered vascular calcifications along the aorta and branch vessels. Prominent but nonpathologic upper abdominal retroperitoneal nodes are identified. There is 1 larger lymph node near the gastrohepatic ligament measuring 2.3 by 1.4 cm. This is unchanged from previous examinations. Reproductive: Prostate is unremarkable. Other: Scattered ascites, similar to the recent prior examination. No free air. Musculoskeletal: Mild curvature and degenerative changes along the spine. IMPRESSION: Overall appearance is similar to prior. 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 Adjacent ill-defined fluid and stranding. A few of the smaller collections near the stomach margin are decreased. In addition there is stable wall thickening of the splenic flexure with loculated fluid along its margin, similar to previous. Evidence of chronic liver disease with a nodular liver. Ascites and varices. Moderate hiatal hernia. Stable enlarged  lymph node along the gastrohepatic ligament. G-tube. Electronically Signed   By: Karen Kays M.D.   On: 01/09/2023 10:18        Scheduled Meds:  enoxaparin (LOVENOX) injection  40 mg Subcutaneous Q24H   folic acid  1 mg Per Tube Daily   free water  80 mL Per Tube 6 X Daily   lipase/protease/amylase  24,000 Units Oral TID AC   nicotine  21 mg Transdermal Daily   pantoprazole  40 mg Oral Q1200   QUEtiapine  50 mg Per Tube QHS   thiamine  100 mg Per Tube Daily   Continuous Infusions:  sodium chloride 100 mL/hr at 01/09/23 2124     LOS: 0 days     Silvano Bilis, MD [facility]   If 7PM-7AM, please contact night-coverage [email or web address] Password [facility] 01/10/2023, 9:11 AM

## 2023-01-11 LAB — BASIC METABOLIC PANEL
Anion gap: 7 (ref 5–15)
BUN: 6 mg/dL (ref 6–20)
CO2: 23 mmol/L (ref 22–32)
Calcium: 7.9 mg/dL — ABNORMAL LOW (ref 8.9–10.3)
Chloride: 107 mmol/L (ref 98–111)
Creatinine, Ser: 0.55 mg/dL — ABNORMAL LOW (ref 0.61–1.24)
GFR, Estimated: >60 mL/min (ref >60)
Glucose, Bld: 92 mg/dL (ref 70–99)
Potassium: 3.7 mmol/L (ref 3.5–5.1)
Sodium: 137 mmol/L (ref 135–145)

## 2023-01-11 LAB — GLUCOSE, CAPILLARY: Glucose-Capillary: 85 mg/dL (ref 70–99)

## 2023-01-11 MED ORDER — PANCRELIPASE (LIP-PROT-AMYL) 24000-76000 UNITS PO CPEP: 24000.0000 [IU] | ORAL_CAPSULE | Freq: Three times a day (TID) | ORAL | 1 refills | Status: DC

## 2023-01-11 NOTE — Discharge Summary (Signed)
Luke Rogers. 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 is a 62 y.o. male with medical history significant of alcohol abuse, depression with anxiety, GERD, hiatal hernia, HCV, alcoholic liver cirrhosis, alcoholic pancreatitis, s/p of PEG tube placement, who presents with abdominal pain.   Patient was recently hospitalized from 5/2 - 5/18 due to alcohol withdrawal, acute pancreatitis secondary to alcohol abuse> he had PEG tube placed during that admission due to inability to tolerate oral intake. Pt states that he has not been using the PEG tube since returning home. He has been eating through mouth, without chocking episode. He states that he has abdominal pain in the past several days, which is located in the central abdomen around PEG tube.  The pain is constant, aching, moderate to severe, nonradiating.  It is not aggravated or alleviated by any known factors.  He reports drainage around PEG tube, but on my examination there is no surrounding  erythema or pus drainage.  He denies nausea or vomiting.  He states that he had 7 watery diarrhea yesterday.  No fever or chills.  Denies symptoms of UTI.  Denies chest pain, cough, shortness of breath.  Patient states he did not drink alcohol after he went home from hospital.  Hospital Course:   Patient presents with abdominal pain. Lipase elevated. CT of abdomen stable from priors, shows presumed pseudocysts in the neck and tail of the pancreas and some "adjacent ill-defined fluid and stranding." Patient was treated for pancreatitis with fluids, pain control. On hospital day 1 patient requested to eat so diet was started and advanced. He tolerated the diet and pain is controlled, on hospital day 2 he requests discharge. He did complain of intermittent loose stools, here c diff was negative, he was started on creon for presumed pancreatic insufficiency, which we will continue at discharge. No signs of withdrawal here. Has g tube but not using it as is tolerating food by mouth, says he has an appointment next week for the g tube to be removed.   Procedures: none   Consultations: none  Discharge Exam: Vitals:   01/10/23 2041 01/11/23 0400  BP:  122/86  Pulse: 94 88  Resp:  18  Temp:  98.2 F (36.8 C)  SpO2: 96% 97%    General: NAD Cardiovascular: RRR Respiratory: CTAB ABdomen: soft, mild ttp epigastrum, g tube in place dressing c/d/i  Discharge Instructions   Discharge Instructions     Diet - low sodium heart healthy   Complete by: As directed  Increase activity slowly   Complete by: As directed       Allergies as of 01/11/2023       Reactions   Tramadol Nausea And Vomiting        Medication List     STOP taking these medications    HYDROcodone-acetaminophen 7.5-325 mg/15 ml solution Commonly known as: HYCET       TAKE these medications    acetaminophen 325 MG tablet Commonly known as: TYLENOL Place 2 tablets (650 mg total) into feeding tube every 6  (six) hours as needed for mild pain (or Fever >/= 101).   famotidine 20 MG tablet Commonly known as: PEPCID Place 1 tablet (20 mg total) into feeding tube daily.   feeding supplement (OSMOLITE 1.5 CAL) Liqd Place 237 mLs into feeding tube 6 (six) times daily.   folic acid 1 MG tablet Commonly known as: FOLVITE Place 1 tablet (1 mg total) into feeding tube daily.   free water Soln Place 80 mLs into feeding tube 6 (six) times daily.   furosemide 20 MG tablet Commonly known as: LASIX Place 1 tablet (20 mg total) into feeding tube daily.   lactulose 10 GM/15ML solution Commonly known as: CHRONULAC Take 30 mLs (20 g total) by mouth 2 (two) times daily as needed for severe constipation.   ondansetron 4 MG disintegrating tablet Commonly known as: ZOFRAN-ODT Take 1 tablet (4 mg total) by mouth every 8 (eight) hours as needed for nausea or vomiting.   oxyCODONE 5 MG immediate release tablet Commonly known as: Roxicodone Take 1 tablet (5 mg total) by mouth every 4 (four) hours as needed for severe pain.   Pancrelipase (Lip-Prot-Amyl) 24000-76000 units Cpep Take 1 capsule (24,000 Units total) by mouth 3 (three) times daily before meals.   polyethylene glycol 17 g packet Commonly known as: MIRALAX / GLYCOLAX Place 17 g into feeding tube 2 (two) times daily.   QUEtiapine 50 MG tablet Commonly known as: SEROQUEL Place 1 tablet (50 mg total) into feeding tube at bedtime.   simethicone 40 MG/0.6ML drops Commonly known as: MYLICON Place 1.2 mLs (80 mg total) into feeding tube every 6 (six) hours as needed for flatulence.   spironolactone 25 MG tablet Commonly known as: ALDACTONE Place 0.5 tablets (12.5 mg total) into feeding tube 2 (two) times daily.   thiamine 100 MG tablet Commonly known as: Vitamin B-1 Place 1 tablet (100 mg total) into feeding tube daily.       Allergies  Allergen Reactions   Tramadol Nausea And Vomiting    Follow-up Information     Margaretann Loveless,  MD Follow up.   Specialty: Internal Medicine Contact information: [address] [place] [address] [phone]                  The results of significant diagnostics from this hospitalization (including imaging, microbiology, ancillary and laboratory) are listed below for reference.    Significant Diagnostic Studies: CT ABDOMEN PELVIS W CONTRAST  Result Date: 01/09/2023 CLINICAL DATA:  Severe pancreatitis EXAM: CT ABDOMEN AND PELVIS WITH CONTRAST TECHNIQUE: Multidetector CT imaging of the abdomen and pelvis was performed using the standard protocol following bolus administration of intravenous contrast. RADIATION DOSE REDUCTION: This exam was performed according to the departmental dose-optimization program which includes automated exposure control, adjustment of the mA and/or kV according to patient size and/or use of iterative reconstruction technique. CONTRAST:  OMNIPAQUE IOHEXOL 300 MG/ML  SOLN COMPARISON:  CT 12/26/2022 and older FINDINGS: Lower  chest: There is some linear opacity lung bases likely scar or atelectasis. No pleural effusion. Large hiatal hernia. Fluid tracks along the mediastinum. Hepatobiliary: Nodular fatty liver. Please correlate for any history of chronic liver disease. The gallbladder is dilated. Patent portal vein. There are some tiny sub 5 mm cystic foci in the liver in segment 4, too small to completely characterize but likely benign cysts. No specific imaging follow-up. Pancreas: Global pancreatic atrophy with some areas of mild pancreatic ductal dilatation. There is particular focal parenchymal loss in the neck of the pancreas where there is cystic lesion. Previously this measured 6.6 x 5.8 cm and today 6.2 by 5.2 cm. Probable evolving pseudocysts. There is also a cystic focus in the tail of the pancreas which previously measured 3.3 x 2.1 cm and today on series 2, image 27 measures 3.0 by 2.4 cm. Significant surrounding stranding identified, similar  to previous. Additional subtle area of loculated fluid extending along the lesser sac, posterior wall of the stomach. These are decreasing. Spleen: Normal in size without focal abnormality. Adrenals/Urinary Tract: Adrenal glands are preserved. No enhancing renal mass or collecting system dilatation. The ureters have normal course and caliber down to the normal caliber urinary bladder. There is several benign Bosniak 1 and 2 renal cysts bilaterally. There is 1 lesion from the upper pole left kidney which is exophytic which is more complex however this exophytic lesion on series 2, image 22 measures 2.2 by 1.6 cm. Hounsfield units of 72 on portal venous phase, 79 on delayed. Previously this has been described as a Bosniak 2 proteinaceous or hemorrhagic lesion. No specific imaging follow-up. Stomach/Bowel: No oral contrast. This is a G-tube in the stomach. Stomach has some mild luminal fluid and air. Small and large bowel are nondilated. Scattered colonic stool. Normal appendix in the right lower quadrant. Stable areas of fold thickening along the splenic flexure of the colon with adjacent fluid, similar to previous. Probable adjacent additional pseudocysts. Vascular/Lymphatic: Normal caliber aorta and IVC. Scattered vascular calcifications along the aorta and branch vessels. Prominent but nonpathologic upper abdominal retroperitoneal nodes are identified. There is 1 larger lymph node near the gastrohepatic ligament measuring 2.3 by 1.4 cm. This is unchanged from previous examinations. Reproductive: Prostate is unremarkable. Other: Scattered ascites, similar to the recent prior examination. No free air. Musculoskeletal: Mild curvature and degenerative changes along the spine. IMPRESSION: Overall appearance is similar to prior. 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 Adjacent ill-defined fluid and stranding. A few of the smaller collections near the stomach margin are decreased. In addition  there is stable wall thickening of the splenic flexure with loculated fluid along its margin, similar to previous. Evidence of chronic liver disease with a nodular liver. Ascites and varices. Moderate hiatal hernia. Stable enlarged lymph node along the gastrohepatic ligament. G-tube. Electronically Signed   By: Karen Kays M.D.   On: 01/09/2023 10:18   CT ABDOMEN PELVIS W CONTRAST  Result Date: 12/26/2022 CLINICAL DATA:  Belly pain.  G-tube that is bleeding.  Pancreatitis. EXAM: CT ABDOMEN AND PELVIS WITH CONTRAST TECHNIQUE: Multidetector CT imaging of the abdomen and pelvis was performed using the standard protocol following bolus administration of intravenous contrast. RADIATION DOSE REDUCTION: This exam was performed according to the departmental dose-optimization program which includes automated exposure control, adjustment of the mA and/or kV according to patient size and/or use of iterative reconstruction technique. CONTRAST:  OMNIPAQUE IOHEXOL 300 MG/ML  SOLN COMPARISON:  CT abdomen and pelvis 12/18/2022  FINDINGS: Lower chest: Bibasilar atelectasis/scarring.  No acute abnormality. Hepatobiliary: Hepatic steatosis. Unremarkable gallbladder and biliary tree. Pancreas: Increased pancreatic head pseudocyst measuring 6.6 x 5.8 cm, previously 6.2 x 4.7 cm. Pseudocyst in the pancreatic tail is similar in size measuring 3.3 x 2.1 cm, previously 3.1 x 1.9 cm. Additional small fluid collections about the stomach, small bowel, and colon in the left upper quadrant are unchanged. No pancreatic ductal dilation. Spleen: Unremarkable. Adrenals/Urinary Tract: Stable adrenal glands. Bilateral renal cysts previously evaluated with MRI 07/18/2022 and compatible with Bosniak 1 and 2 cysts which do not require additional follow-up. No urinary calculi or hydronephrosis. Unremarkable bladder. Stomach/Bowel: Gastrostomy tube. Moderate hiatal hernia. Normal caliber large and small bowel. Colon wall thickening near the  splenic flexure, likely reactive secondary to pancreatitis. Enteric contrast within the ileum and proximal colon. Vascular/Lymphatic: Mild aortic calcified atherosclerosis. Unchanged 1.3 cm gastrohepatic ligament node is likely reactive. Reproductive: No acute abnormality. Other: Slightly increased small-moderate volume abdominopelvic ascites. No free intraperitoneal air. Musculoskeletal: No acute osseous abnormality. IMPRESSION: 1. Increased size of the pancreatic head pseudocyst. Pancreatic tail pseudocyst and fluid collections in the left upper quadrant are otherwise unchanged. 2. Slightly increased small-moderate volume abdominopelvic ascites. 3. G-tube within the stomach.  Moderate hiatal hernia. 4. Wall thickening about the colon at the splenic flexure likely reactive secondary to pancreatitis. Aortic Atherosclerosis (ICD10-I70.0). Electronically Signed   By: Minerva Fester M.D.   On: 12/26/2022 18:10   DG Chest Port 1 View  Result Date: 12/26/2022 CLINICAL DATA:  [id number] SOB (shortness of breath) [id number] EXAM: PORTABLE CHEST 1 VIEW COMPARISON:  CXR 12/02/22 FINDINGS: No pleural effusion. No pneumothorax. Normal cardiac and mediastinal contours. No focal airspace opacity. No radiographically apparent displaced rib fractures. Visualized upper abdomen is unremarkable. IMPRESSION: No focal airspace opacity. Electronically Signed   By: Lorenza Cambridge M.D.   On: 12/26/2022 16:33   DG ABDOMEN PEG TUBE LOCATION  Result Date: 12/26/2022 CLINICAL DATA:  Abdominal pain after G-tube feeds. Eval with contrast to confirm placement. EXAM: ABDOMEN - 1 VIEW COMPARISON:  None Available. FINDINGS: Enteric contrast is visualized within the stomach and duodenum. No extraluminal contrast on single AP view. The bowel gas pattern is normal. No radio-opaque calculi or other significant radiographic abnormality are seen. IMPRESSION: Enteric contrast within the stomach and duodenum. Electronically Signed   By: Minerva Fester M.D.    On: 12/26/2022 16:30   CT ABDOMEN PELVIS W CONTRAST  Result Date: 12/18/2022 CLINICAL DATA:  Abdominal pain, acute. Rule out abdominal obstruction. EXAM: CT ABDOMEN AND PELVIS WITH CONTRAST TECHNIQUE: Multidetector CT imaging of the abdomen and pelvis was performed using the standard protocol following bolus administration of intravenous contrast. RADIATION DOSE REDUCTION: This exam was performed according to the departmental dose-optimization program which includes automated exposure control, adjustment of the mA and/or kV according to patient size and/or use of iterative reconstruction technique. CONTRAST:  OMNIPAQUE IOHEXOL 300 MG/ML  SOLN COMPARISON:  CT examination dated Dec 09, 2022 FINDINGS: Lower chest: Bibasilar dependent atelectasis. Moderate size hiatal hernia. No acute abnormality. Hepatobiliary: No focal liver abnormality is seen. No gallstones, gallbladder wall thickening, or biliary dilatation. Pancreas: Pancreatic head pseudocyst measuring 4.7 x 6.2 cm, not significantly changed. Cyst near the pancreatic tail measuring 3.1 x 1.9 cm, unchanged. No pancreatic ductal dilatation. Spleen: Normal in size without focal abnormality. Adrenals/Urinary Tract: Adrenal glands are unremarkable. Kidneys are normal, without renal calculi, focal lesion, or hydronephrosis. Simple bilateral exophytic renal cysts, unchanged. Bladder is unremarkable. Stomach/Bowel: Moderate  size hiatal hernia. Percutaneous gastrostomy tube in the gastric body. Small bowel loops are normal in caliber. Appendix not identified. Gaseous distention of the ascending and transverse colon. Descending and sigmoid colonic loops are normal in caliber. No evidence of bowel obstruction. Vascular/Lymphatic: No significant vascular findings are present. No enlarged abdominal or pelvic lymph nodes. Reproductive: Prostate is unremarkable. Other: Abdominal ascites with fluid in the right upper quadrant and about the pancreas and in the dependent  pelvis, not significantly changed. Previously noted multiple locules of free air are not seen on this examination. Musculoskeletal: No acute or significant osseous findings. IMPRESSION: 1. Gaseous distention of the ascending and transverse colon without evidence of bowel obstruction. 2. Pancreatic head pseudocyst measuring 4.7 x 6.2 cm and cyst near the pancreatic tail measuring 3.1 x 1.9 cm, unchanged. 3. Small to moderate abdominal ascites, not significantly changed. 4. Moderate size hiatal hernia. 5. Percutaneous gastrostomy tube in the gastric body without evidence of surrounding inflammatory changes or evidence of extraluminal free air. Electronically Signed   By: Larose Hires D.O.   On: 12/18/2022 14:15   DG Abd 1 View  Result Date: 12/17/2022 CLINICAL DATA:  Abdominal pain. Pancreatitis. EXAM: ABDOMEN - 1 VIEW COMPARISON:  CT 12/09/2022 FINDINGS: There is gaseous distention of small bowel in the central abdomen, up to 4.5 cm. Gastrostomy tube projects over the central upper abdomen. Small volume of formed stool in the colon. There are pelvic phleboliths. No evidence of free air on these supine views. IMPRESSION: Gaseous distention of small bowel in the central abdomen, up to 4.5 cm. Favor reactive ileus in the setting of pancreatitis. Consider radiologic follow-up to exclude small bowel obstruction. Electronically Signed   By: Narda Rutherford M.D.   On: 12/17/2022 21:14   DG Shoulder Right  Result Date: 12/14/2022 CLINICAL DATA:  Right shoulder pain. EXAM: RIGHT SHOULDER - 2+ VIEW COMPARISON:  None Available. FINDINGS: The joint spaces are maintained. No acute fracture is identified. No significant degenerative changes. No abnormal soft tissue calcifications. The visualized right lung is clear and the visualized right ribs are intact. IMPRESSION: No acute bony findings or significant degenerative changes. Electronically Signed   By: Rudie Meyer M.D.   On: 12/14/2022 16:25    Microbiology: Recent  Results (from the past 240 hour(s))  C Difficile Quick Screen w PCR reflex     Status: None   Collection Time: 01/09/23  6:29 PM   Specimen: Urine, Clean Catch; Stool  Result Value Ref Range Status   C Diff antigen NEGATIVE NEGATIVE Final   C Diff toxin NEGATIVE NEGATIVE Final   C Diff interpretation No C. difficile detected.  Final    Comment: Performed at [facility], [address] [place], [place] [address]     Labs: Basic Metabolic Panel: Recent Labs  Lab 01/09/23 0811 01/10/23 0705 01/11/23 0527  NA 137 138 137  K 3.6 3.4* 3.7  CL 105 109 107  CO2 24 21* 23  GLUCOSE 120* 83 92  BUN 11 8 6   CREATININE 0.52* 0.53* 0.55*  CALCIUM 8.5* 7.9* 7.9*  MG 1.6*  --   --   PHOS 4.4  --   --    Liver Function Tests: Recent Labs  Lab 01/09/23 0811  AST 29  ALT 14  ALKPHOS 91  BILITOT 0.8  PROT 8.6*  ALBUMIN 2.8*   Recent Labs  Lab 01/09/23 0811 01/10/23 0705  LIPASE 661* 280*   Recent Labs  Lab 01/09/23 1418  AMMONIA 17   CBC: Recent Labs  Lab 01/09/23 0811 01/10/23 0705  WBC 8.0 6.5  HGB 12.2* 10.9*  HCT 36.2* 32.6*  MCV 90.5 91.3  PLT 258 215   Cardiac Enzymes: No results for input(s): "CKTOTAL", "CKMB", "CKMBINDEX", "TROPONINI" in the last 168 hours. BNP: BNP (last 3 results) No results for input(s): "BNP" in the last 8760 hours.  ProBNP (last 3 results) No results for input(s): "PROBNP" in the last 8760 hours.  CBG: Recent Labs  Lab 01/10/23 0801  GLUCAP 76       Signed:  Silvano Bilis MD.  [facility] 01/11/2023, 8:56 AM

## 2023-01-11 NOTE — Progress Notes (Signed)
Luke Rogers. to be D/C'd Home per MD order.  Discussed prescriptions and follow up appointments with the patient. Prescriptions given to patient, medication list explained in detail. Pt verbalized understanding.  Allergies as of 01/11/2023       Reactions   Tramadol Nausea And Vomiting        Medication List     STOP taking these medications    HYDROcodone-acetaminophen 7.5-325 mg/15 ml solution Commonly known as: HYCET       TAKE these medications    acetaminophen 325 MG tablet Commonly known as: TYLENOL Place 2 tablets (650 mg total) into feeding tube every 6 (six) hours as needed for mild pain (or Fever >/= 101).   famotidine 20 MG tablet Commonly known as: PEPCID Place 1 tablet (20 mg total) into feeding tube daily.   feeding supplement (OSMOLITE 1.5 CAL) Liqd Place 237 mLs into feeding tube 6 (six) times daily.   folic acid 1 MG tablet Commonly known as: FOLVITE Place 1 tablet (1 mg total) into feeding tube daily.   free water Soln Place 80 mLs into feeding tube 6 (six) times daily.   furosemide 20 MG tablet Commonly known as: LASIX Place 1 tablet (20 mg total) into feeding tube daily.   lactulose 10 GM/15ML solution Commonly known as: CHRONULAC Take 30 mLs (20 g total) by mouth 2 (two) times daily as needed for severe constipation.   ondansetron 4 MG disintegrating tablet Commonly known as: ZOFRAN-ODT Take 1 tablet (4 mg total) by mouth every 8 (eight) hours as needed for nausea or vomiting.   oxyCODONE 5 MG immediate release tablet Commonly known as: Roxicodone Take 1 tablet (5 mg total) by mouth every 4 (four) hours as needed for severe pain.   Pancrelipase (Lip-Prot-Amyl) 24000-76000 units Cpep Take 1 capsule (24,000 Units total) by mouth 3 (three) times daily before meals.   polyethylene glycol 17 g packet Commonly known as: MIRALAX / GLYCOLAX Place 17 g into feeding tube 2 (two) times daily.   QUEtiapine 50 MG tablet Commonly known  as: SEROQUEL Place 1 tablet (50 mg total) into feeding tube at bedtime.   simethicone 40 MG/0.6ML drops Commonly known as: MYLICON Place 1.2 mLs (80 mg total) into feeding tube every 6 (six) hours as needed for flatulence.   spironolactone 25 MG tablet Commonly known as: ALDACTONE Place 0.5 tablets (12.5 mg total) into feeding tube 2 (two) times daily.   thiamine 100 MG tablet Commonly known as: Vitamin B-1 Place 1 tablet (100 mg total) into feeding tube daily.        Vitals:   01/11/23 0400 01/11/23 0906  BP: 122/86 97/69  Pulse: 88 89  Resp: 18 18  Temp: 98.2 F (36.8 C) 98 F (36.7 C)  SpO2: 97% 96%    Skin clean, dry and intact without evidence of skin break down, no evidence of skin tears noted. IV catheter discontinued intact. Site without signs and symptoms of complications. Dressing and pressure applied. Pt denies pain at this time. No complaints noted.  An After Visit Summary was printed and given to the patient. Patient escorted via WC, and D/C home via private auto.  Cyrah Mclamb C. Jilda Roche

## 2023-01-11 NOTE — Plan of Care (Signed)
Adequate for discharge.

## 2023-01-11 NOTE — Progress Notes (Signed)
Discharge education completed. IV removed.  Cornell Barman Danyel Griess

## 2023-01-11 NOTE — TOC Initial Note (Signed)
Transition of Care (TOC) - Initial/Assessment Note    Patient Details  Name: Luke Rogers. MRN: [id number] Date of Birth: August 03, 1960  Transition of Care [facility]) CM/SW Contact:    Kreg Shropshire, RN Phone Number: 01/11/2023, 9:27 AM  Clinical Narrative:                  Cm spoke with pt in regards to d/c plan. His mother is going to pick him up. He has f/u appt with Dr. Welton Flakes 6/27.        Patient Goals and CMS Choice            Expected Discharge Plan and Services         Expected Discharge Date: 01/11/23                                    Prior Living Arrangements/Services                       Activities of Daily Living Home Assistive Devices/Equipment: Enteral Feeding Supplies ADL Screening (condition at time of admission) Patient's cognitive ability adequate to safely complete daily activities?: Yes Is the patient deaf or have difficulty hearing?: No Does the patient have difficulty seeing, even when wearing glasses/contacts?: No Does the patient have difficulty concentrating, remembering, or making decisions?: No Patient able to express need for assistance with ADLs?: Yes Does the patient have difficulty dressing or bathing?: No Independently performs ADLs?: Yes (appropriate for developmental age) Does the patient have difficulty walking or climbing stairs?: No Weakness of Legs: None Weakness of Arms/Hands: None  Permission Sought/Granted                  Emotional Assessment              Admission diagnosis:  Acute recurrent pancreatitis [K85.90] Alcohol-induced acute pancreatitis without infection or necrosis [K85.20] Patient Active Problem List   Diagnosis Date Noted   Acute recurrent pancreatitis 01/09/2023   Depression with anxiety 01/09/2023   Diarrhea 01/09/2023   Polysubstance abuse (HCC) 01/09/2023   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) 01/09/2023   Alcoholic cirrhosis of liver with ascites (HCC) 01/09/2023   GAD (generalized anxiety disorder) 12/22/2022   Hepatitis C virus infection without hepatic coma 12/22/2022   Protein-calorie malnutrition, severe 12/06/2022   Alcoholic pancreatitis 11/23/2022   Non-recurrent bilateral inguinal hernia without obstruction or gangrene 09/19/2022   Recurrent acute pancreatitis 07/17/2022   Pancreatic mass 07/17/2022   Left renal mass 07/17/2022   Hypomagnesemia 12/22/2021   GERD (gastroesophageal reflux disease) 12/22/2021   Visual hallucination    Accidental drug overdose    Hyponatremia 12/18/2021   Metabolic acidosis 12/18/2021   Hyperbilirubinemia 12/18/2021   Visual hallucinations 12/18/2021   Amphetamine intoxication (HCC) 12/18/2021   Amphetamine adverse reaction 12/18/2021   Alcohol abuse    Elevated LFTs    Thrombocytopenia (HCC)    Tobacco abuse    Acute alcoholic pancreatitis 01/17/2021   Nicotine dependence 01/17/2021   Transaminitis 01/17/2021   Malnutrition of moderate degree 01/09/2018   Acute purulent meningitis 01/08/2018   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 [place] [address] Phone: [phone] Fax: [phone]  [facility] - [place], [place] - [address] [place] [phone] Phone: [phone] Fax: [phone]     Social Determinants of Health (SDOH) Social History: SDOH Screenings   Food Insecurity: No Food Insecurity (01/09/2023)  Housing: Low Risk  (01/09/2023)  Transportation Needs: No Transportation Needs (01/09/2023)  Utilities: Not At Risk (01/09/2023)  Tobacco Use: High Risk (01/09/2023)   SDOH Interventions:      Readmission Risk Interventions    01/11/2023    9:17 AM  Readmission Risk Prevention Plan  Transportation Screening Complete  Medication Review (RN Care Manager) Complete  PCP or Specialist appointment within 3-5 days of discharge Complete  HRI or Home Care Consult Patient refused  SW Recovery Care/Counseling Consult Complete  Palliative Care Screening Not Applicable  Skilled Nursing Facility Not Applicable

## 2023-01-11 NOTE — Plan of Care (Signed)

## 2023-01-11 NOTE — TOC CM/SW Note (Deleted)
Transition of Care [facility]) - Inpatient Brief Assessment   Patient Details  Name: Luke Rogers. MRN: [id number] Date of Birth: 04/23/1961  Transition of Care [facility]) CM/SW Contact:    Kreg Shropshire, RN Phone Number: 01/11/2023, 9:10 AM   Clinical Narrative:    Transition of Care Asessment: Insurance and Status: Insurance coverage has been reviewed Patient has primary care physician: Yes     Prior/Current Home Services: No current home services Social Determinants of Health Reivew: SDOH reviewed no interventions necessary Readmission risk has been reviewed: Yes Transition of care needs: no transition of care needs at this time

## 2023-01-16 DIAGNOSIS — E512 Wernicke's encephalopathy: Secondary | ICD-10-CM

## 2023-01-16 DIAGNOSIS — Z431 Encounter for attention to gastrostomy: Secondary | ICD-10-CM

## 2023-01-16 HISTORY — DX: Encounter for attention to gastrostomy: Z43.1

## 2023-01-16 HISTORY — DX: Wernicke's encephalopathy: E51.2

## 2023-01-18 ENCOUNTER — Ambulatory Visit: Payer: Medicaid Other | Admitting: Internal Medicine

## 2023-01-23 ENCOUNTER — Other Ambulatory Visit: Payer: Self-pay | Admitting: Internal Medicine

## 2023-01-23 ENCOUNTER — Ambulatory Visit: Payer: Self-pay | Admitting: Gastroenterology

## 2023-01-23 ENCOUNTER — Ambulatory Visit: Payer: Medicaid Other

## 2023-01-23 DIAGNOSIS — H60313 Diffuse otitis externa, bilateral: Secondary | ICD-10-CM

## 2023-01-23 MED ORDER — CIPRO HC 0.2-1 % OT SUSP
3.0000 [drp] | Freq: Two times a day (BID) | OTIC | 0 refills | Status: AC
Start: 2023-01-23 — End: 2023-01-30

## 2023-02-06 DIAGNOSIS — R2242 Localized swelling, mass and lump, left lower limb: Secondary | ICD-10-CM | POA: Diagnosis not present

## 2023-02-06 DIAGNOSIS — M7989 Other specified soft tissue disorders: Secondary | ICD-10-CM | POA: Diagnosis not present

## 2023-02-16 ENCOUNTER — Ambulatory Visit: Payer: Medicaid Other | Admitting: Family

## 2023-02-16 VITALS — BP 80/65 | HR 84 | Ht 74.0 in | Wt 147.6 lb

## 2023-02-16 DIAGNOSIS — K219 Gastro-esophageal reflux disease without esophagitis: Secondary | ICD-10-CM

## 2023-02-16 DIAGNOSIS — F5104 Psychophysiologic insomnia: Secondary | ICD-10-CM | POA: Diagnosis not present

## 2023-02-16 MED ORDER — TRAZODONE HCL 50 MG PO TABS: 50.0000 mg | ORAL_TABLET | Freq: Every evening | ORAL | 0 refills | Status: DC | PRN

## 2023-02-16 MED ORDER — PANTOPRAZOLE SODIUM 40 MG PO TBEC: 40.0000 mg | DELAYED_RELEASE_TABLET | Freq: Every day | ORAL | 0 refills | Status: DC

## 2023-03-02 ENCOUNTER — Other Ambulatory Visit: Payer: Self-pay | Admitting: Internal Medicine

## 2023-03-02 ENCOUNTER — Ambulatory Visit: Payer: Medicaid Other | Admitting: Internal Medicine

## 2023-03-02 ENCOUNTER — Encounter: Payer: Self-pay | Admitting: Internal Medicine

## 2023-03-02 VITALS — BP 120/76 | HR 98 | Ht 74.5 in | Wt 152.0 lb

## 2023-03-02 DIAGNOSIS — E43 Unspecified severe protein-calorie malnutrition: Secondary | ICD-10-CM

## 2023-03-02 DIAGNOSIS — K852 Alcohol induced acute pancreatitis without necrosis or infection: Secondary | ICD-10-CM

## 2023-03-02 DIAGNOSIS — F411 Generalized anxiety disorder: Secondary | ICD-10-CM

## 2023-03-02 DIAGNOSIS — K219 Gastro-esophageal reflux disease without esophagitis: Secondary | ICD-10-CM | POA: Diagnosis not present

## 2023-03-02 NOTE — Progress Notes (Signed)
Established Patient Office Visit  Subjective:  Patient ID: Luke Elwart., male    DOB: 1961-06-09  Age: 62 y.o. MRN: [id number]  Chief Complaint  Patient presents with   Follow-up    2 week follow up    Patient comes in for follow-up today.  He was in recently with complaints of a dry cough and difficulty sleeping.  He was started on a PPI as well as trazodone.  Today he reports both of his complaints have resolved.  He does not have any further cough, no heartburn, no nausea, no vomiting. He is also sleeping well at night.  His PEG tube has been removed and he is eating much better and has gained some weight.  o other concerns at this time.   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   Pancreatitis     Past Surgical History:  Procedure Laterality Date   FOOT SURGERY Bilateral    As infant   INSERTION OF MESH Bilateral 09/25/2022   Procedure: INSERTION OF MESH;  Surgeon: Campbell Lerner, MD;  Location: [facility] ORS;  Service: General;  Laterality: Bilateral;   PEG PLACEMENT N/A 12/08/2022   Procedure: PERCUTANEOUS ENDOSCOPIC GASTROSTOMY (PEG) PLACEMENT;  Surgeon: Toledo, Boykin Nearing, MD;  Location: [facility] ENDOSCOPY;  Service: Gastroenterology;  Laterality: N/A;   WRIST SURGERY Right     Social History   Socioeconomic History   Marital status: Single    Spouse name: Not on file   Number of children: Not on file   Years of education: Not on file   Highest education level: Not on file  Occupational History   Not on file  Tobacco Use   Smoking status: Every Day    Current packs/day: 0.50    Types: Cigarettes    Passive exposure: Past   Smokeless tobacco: Never  Vaping Use   Vaping status: Never Used  Substance and Sexual Activity   Alcohol use: Not Currently    Alcohol/week: 63.0 standard drinks of  alcohol    Types: 63 Cans of beer per week   Drug use: Yes    Types: Amphetamines   Sexual activity: Yes  Other Topics Concern   Not on file  Social History Narrative   Not on file   Social Determinants of Health   Financial Resource Strain: Not on file  Food Insecurity: No Food Insecurity (01/09/2023)   Hunger Vital Sign    Worried About Running Out of Food in the Last Year: Never true    Ran Out of Food in the Last Year: Never true  Transportation Needs: No Transportation Needs (01/09/2023)   PRAPARE - Administrator, Civil Service (Medical): No    Lack of Transportation (Non-Medical): No  Physical Activity: Not on file  Stress: Not on file  Social Connections: Not on file  Intimate Partner Violence: Not At Risk (01/09/2023)   Humiliation, Afraid, Rape, and Kick questionnaire    Fear of Current or Ex-Partner: No    Emotionally Abused: No    Physically Abused: No    Sexually Abused: No    Family History  Problem Relation Age of Onset   Hypertension Mother     Allergies  Allergen Reactions   Tramadol Nausea And Vomiting  Review of Systems  Constitutional: Negative.   HENT: Negative.    Eyes: Negative.   Respiratory: Negative.  Negative for cough and shortness of breath.   Cardiovascular: Negative.  Negative for chest pain, palpitations and leg swelling.  Gastrointestinal: Negative.  Negative for abdominal pain, constipation, diarrhea, heartburn, nausea and vomiting.  Genitourinary: Negative.  Negative for dysuria and flank pain.  Musculoskeletal: Negative.  Negative for joint pain and myalgias.  Skin: Negative.   Neurological: Negative.  Negative for dizziness and headaches.  Endo/Heme/Allergies: Negative.   Psychiatric/Behavioral: Negative.  Negative for depression and suicidal ideas. The patient is not nervous/anxious.        Objective:   BP 120/76   Pulse 98   Ht 6' 2.5" (1.892 m)   Wt 152 lb (68.9 kg)   SpO2 97%   BMI 19.25 kg/m    Vitals:   03/02/23 1531  BP: 120/76  Pulse: 98  Height: 6' 2.5" (1.892 m)  Weight: 152 lb (68.9 kg)  SpO2: 97%  BMI (Calculated): 19.26    Physical Exam Vitals and nursing note reviewed.  Constitutional:      Appearance: Normal appearance.  HENT:     Head: Normocephalic and atraumatic.     Nose: Nose normal.     Mouth/Throat:     Mouth: Mucous membranes are moist.     Pharynx: Oropharynx is clear.  Eyes:     Conjunctiva/sclera: Conjunctivae normal.     Pupils: Pupils are equal, round, and reactive to light.  Cardiovascular:     Rate and Rhythm: Normal rate and regular rhythm.     Pulses: Normal pulses.     Heart sounds: Normal heart sounds.  Pulmonary:     Effort: Pulmonary effort is normal.     Breath sounds: Normal breath sounds.  Abdominal:     General: Bowel sounds are normal.     Palpations: Abdomen is soft.  Musculoskeletal:        General: Normal range of motion.     Cervical back: Normal range of motion.  Skin:    General: Skin is warm and dry.  Neurological:     General: No focal deficit present.     Mental Status: He is alert and oriented to person, place, and time.  Psychiatric:        Mood and Affect: Mood normal.        Behavior: Behavior normal.        Judgment: Judgment normal.      No results found for any visits on 03/02/23.  Recent Results (from the past 2160 hour(s))  Basic metabolic panel     Status: Abnormal   Collection Time: 12/03/22  4:28 AM  Result Value Ref Range   Sodium 138 135 - 145 mmol/L   Potassium 3.9 3.5 - 5.1 mmol/L   Chloride 107 98 - 111 mmol/L   CO2 25 22 - 32 mmol/L   Glucose, Bld 97 70 - 99 mg/dL    Comment: Glucose reference range applies only to samples taken after fasting for at least 8 hours.   BUN 21 (H) 6 - 20 mg/dL   Creatinine, Ser 9.62 (L) 0.61 - 1.24 mg/dL   Calcium 8.0 (L) 8.9 - 10.3 mg/dL   GFR, Estimated >95 >28 mL/min    Comment: (NOTE) Calculated using the CKD-EPI Creatinine Equation (2021)     Anion gap 6 5 - 15    Comment: Performed at [facility], [address], [place], [place]  [address]  Magnesium     Status: None   Collection Time: 12/03/22  4:28 AM  Result Value Ref Range   Magnesium 1.8 1.7 - 2.4 mg/dL    Comment: Performed at [facility], [address] [place], [place] [address]  Phosphorus     Status: None   Collection Time: 12/03/22  4:28 AM  Result Value Ref Range   Phosphorus 3.9 2.5 - 4.6 mg/dL    Comment: Performed at [facility], [address] [place], [place] [address]  Hepatic function panel     Status: Abnormal   Collection Time: 12/03/22  4:28 AM  Result Value Ref Range   Total Protein 7.4 6.5 - 8.1 g/dL   Albumin 2.2 (L) 3.5 - 5.0 g/dL   AST 72 (H) 15 - 41 U/L   ALT 28 0 - 44 U/L   Alkaline Phosphatase 80 38 - 126 U/L   Total Bilirubin 1.0 0.3 - 1.2 mg/dL   Bilirubin, Direct 0.4 (H) 0.0 - 0.2 mg/dL   Indirect Bilirubin 0.6 0.3 - 0.9 mg/dL    Comment: Performed at [facility], [address] [place], [place] [address]  Hepatic function panel     Status: Abnormal   Collection Time: 12/04/22  4:33 AM  Result Value Ref Range   Total Protein 7.2 6.5 - 8.1 g/dL   Albumin 2.1 (L) 3.5 - 5.0 g/dL   AST 58 (H) 15 - 41 U/L   ALT 26 0 - 44 U/L   Alkaline Phosphatase 90 38 - 126 U/L   Total Bilirubin 0.8 0.3 - 1.2 mg/dL   Bilirubin, Direct 0.3 (H) 0.0 - 0.2 mg/dL   Indirect Bilirubin 0.5 0.3 - 0.9 mg/dL    Comment: Performed at [facility], [address] [place], [place] [address]  CBC with Differential/Platelet     Status: Abnormal   Collection Time: 12/04/22  4:33 AM  Result Value Ref Range   WBC 8.8 4.0 - 10.5 K/uL   RBC 3.51 (L) 4.22 - 5.81 MIL/uL   Hemoglobin 11.4 (L) 13.0 - 17.0 g/dL   HCT 47.4 (L) 25.9 - 56.3 %   MCV 97.7 80.0 - 100.0 fL   MCH 32.5 26.0 - 34.0 pg   MCHC 33.2 30.0 - 36.0 g/dL   RDW 87.5 64.3 - 32.9 %   Platelets 255 150 - 400 K/uL   nRBC 0.0 0.0 - 0.2 %    Neutrophils Relative % 68 %   Neutro Abs 6.1 1.7 - 7.7 K/uL   Lymphocytes Relative 17 %   Lymphs Abs 1.5 0.7 - 4.0 K/uL   Monocytes Relative 8 %   Monocytes Absolute 0.7 0.1 - 1.0 K/uL   Eosinophils Relative 5 %   Eosinophils Absolute 0.5 0.0 - 0.5 K/uL   Basophils Relative 1 %   Basophils Absolute 0.1 0.0 - 0.1 K/uL   Immature Granulocytes 1 %   Abs Immature Granulocytes 0.04 0.00 - 0.07 K/uL    Comment: Performed at [facility], [address] [place], [place] [address]  Basic metabolic panel     Status: Abnormal   Collection Time: 12/04/22  4:33 AM  Result Value Ref Range   Sodium 138 135 - 145 mmol/L   Potassium 3.9 3.5 - 5.1 mmol/L   Chloride 105 98 - 111 mmol/L   CO2 26 22 - 32 mmol/L   Glucose, Bld 107 (H) 70 - 99 mg/dL    Comment: Glucose reference range  applies only to samples taken after fasting for at least 8 hours.   BUN 22 (H) 6 - 20 mg/dL   Creatinine, Ser 4.54 (L) 0.61 - 1.24 mg/dL   Calcium 8.0 (L) 8.9 - 10.3 mg/dL   GFR, Estimated >09 >81 mL/min    Comment: (NOTE) Calculated using the CKD-EPI Creatinine Equation (2021)    Anion gap 7 5 - 15    Comment: Performed at [facility], [address] [place], [place] [address]  Ammonia     Status: Abnormal   Collection Time: 12/04/22  1:48 PM  Result Value Ref Range   Ammonia 56 (H) 9 - 35 umol/L    Comment: Performed at [facility], [address] [place], [place] [address]  Hepatic function panel     Status: Abnormal   Collection Time: 12/05/22  4:38 AM  Result Value Ref Range   Total Protein 7.9 6.5 - 8.1 g/dL   Albumin 2.2 (L) 3.5 - 5.0 g/dL   AST 70 (H) 15 - 41 U/L   ALT 30 0 - 44 U/L   Alkaline Phosphatase 103 38 - 126 U/L   Total Bilirubin 0.8 0.3 - 1.2 mg/dL   Bilirubin, Direct 0.3 (H) 0.0 - 0.2 mg/dL   Indirect Bilirubin 0.5 0.3 - 0.9 mg/dL    Comment: Performed at [facility], [address] [place], [place] [address]  CBC with Differential/Platelet      Status: Abnormal   Collection Time: 12/05/22  4:38 AM  Result Value Ref Range   WBC 8.6 4.0 - 10.5 K/uL   RBC 3.68 (L) 4.22 - 5.81 MIL/uL   Hemoglobin 11.9 (L) 13.0 - 17.0 g/dL   HCT 65.7 (L) 84.6 - 96.2 %   MCV 97.0 80.0 - 100.0 fL   MCH 32.3 26.0 - 34.0 pg   MCHC 33.3 30.0 - 36.0 g/dL   RDW 95.2 84.1 - 32.4 %   Platelets 311 150 - 400 K/uL   nRBC 0.0 0.0 - 0.2 %   Neutrophils Relative % 67 %   Neutro Abs 5.8 1.7 - 7.7 K/uL   Lymphocytes Relative 16 %   Lymphs Abs 1.4 0.7 - 4.0 K/uL   Monocytes Relative 9 %   Monocytes Absolute 0.8 0.1 - 1.0 K/uL   Eosinophils Relative 6 %   Eosinophils Absolute 0.5 0.0 - 0.5 K/uL   Basophils Relative 1 %   Basophils Absolute 0.1 0.0 - 0.1 K/uL   Immature Granulocytes 1 %   Abs Immature Granulocytes 0.04 0.00 - 0.07 K/uL    Comment: Performed at [facility], [address] [place], [place] [address]  Basic metabolic panel     Status: Abnormal   Collection Time: 12/05/22  4:38 AM  Result Value Ref Range   Sodium 139 135 - 145 mmol/L   Potassium 3.6 3.5 - 5.1 mmol/L   Chloride 108 98 - 111 mmol/L   CO2 26 22 - 32 mmol/L   Glucose, Bld 112 (H) 70 - 99 mg/dL    Comment: Glucose reference range applies only to samples taken after fasting for at least 8 hours.   BUN 21 (H) 6 - 20 mg/dL   Creatinine, Ser 7.25 (L) 0.61 - 1.24 mg/dL   Calcium 8.4 (L) 8.9 - 10.3 mg/dL   GFR, Estimated >36 >64 mL/min    Comment: (NOTE) Calculated using the CKD-EPI Creatinine Equation (2021)    Anion gap 5 5 - 15    Comment:  Performed at [facility], [address], [place], [place] [address]  Surgical pathology     Status: None   Collection Time: 12/06/22  1:50 AM  Result Value Ref Range   SURGICAL PATHOLOGY      SURGICAL PATHOLOGY CASE: [id number] PATIENT: Lennox Solders Surgical Pathology Report     Specimen Submitted: A. Sputum tissue  Clinical History: Pulled NG, coughed up this specimen tissue  thereafter.    DIAGNOSIS: A. SPUTUM TISSUE; EXPECTORATED. - NO EVIDENCE OF MALIGNANCY. - MUCOID TISSUE WITH FOCAL REACTIVE SQUAMOUS EPITHELIUM, NECROINFLAMMATORY DEBRIS, AND BACTERIAL AGGREGATES.  Comment: The significance of this tissue is unclear.  The presence of squamous epithelial elements, and absence of ciliated respiratory epithelium and pulmonary macrophages, would suggest an origin within the upper aerodigestive tract/oral cavity.  There is no definite evidence of atypia or malignancy.  Clinical correlation is recommended.  GROSS DESCRIPTION: A. Labeled: Labeled with the patient's name and date of birth (per requisition patient coughed up tissue) Received: Fresh Collection time: 1:50 AM on 12/06/2022 Placed into formalin time: 2:39 AM on 12/06/2022 Tissue  fragment(s): 1 Size: 4.0 x 3.0 x 2.0 cm Description: Received is a tan-pink, mucoid, rubbery tissue fragment with gray, friable necrotic cut surfaces Representative sections are submitted in 3 cassettes.  CM 12/06/2022  Final Diagnosis performed by Katherine Mantle, MD.   Electronically signed 12/07/2022 9:58:14AM The electronic signature indicates that the named Attending Pathologist has evaluated the specimen Technical component performed at [facility], [address] [place], [place] [address] Lab: [phone] Dir: Jolene Schimke, MD, MMM  Professional component performed at [facility], [address] [place], [place], [place] [address] Lab: [phone] Dir: Beryle Quant, MD   CBC with Differential/Platelet     Status: Abnormal   Collection Time: 12/06/22  4:38 AM  Result Value Ref Range   WBC 8.8 4.0 - 10.5 K/uL   RBC 3.60 (L) 4.22 - 5.81 MIL/uL   Hemoglobin 11.6 (L) 13.0 - 17.0 g/dL   HCT 73.2 (L) 20.2 - 54.2 %   MCV 94.7 80.0 - 100.0 fL   MCH 32.2 26.0 - 34.0 pg   MCHC 34.0 30.0 - 36.0 g/dL   RDW 70.6 23.7 - 62.8 %   Platelets 339 150 - 400 K/uL   nRBC 0.0 0.0 - 0.2 %   Neutrophils  Relative % 65 %   Neutro Abs 5.8 1.7 - 7.7 K/uL   Lymphocytes Relative 21 %   Lymphs Abs 1.8 0.7 - 4.0 K/uL   Monocytes Relative 10 %   Monocytes Absolute 0.9 0.1 - 1.0 K/uL   Eosinophils Relative 3 %   Eosinophils Absolute 0.3 0.0 - 0.5 K/uL   Basophils Relative 1 %   Basophils Absolute 0.1 0.0 - 0.1 K/uL   Immature Granulocytes 0 %   Abs Immature Granulocytes 0.03 0.00 - 0.07 K/uL    Comment: Performed at [facility], [address], [place], [place] [address]  Basic metabolic panel     Status: Abnormal   Collection Time: 12/06/22  4:38 AM  Result Value Ref Range   Sodium 138 135 - 145 mmol/L   Potassium 3.6 3.5 - 5.1 mmol/L   Chloride 107 98 - 111 mmol/L   CO2 23 22 - 32 mmol/L   Glucose, Bld 102 (H) 70 - 99 mg/dL    Comment: Glucose reference range applies only to samples taken after fasting for at least 8 hours.   BUN 16 6 - 20 mg/dL  Creatinine, Ser 0.52 (L) 0.61 - 1.24 mg/dL   Calcium 8.3 (L) 8.9 - 10.3 mg/dL   GFR, Estimated >91 >47 mL/min    Comment: (NOTE) Calculated using the CKD-EPI Creatinine Equation (2021)    Anion gap 8 5 - 15    Comment: Performed at [facility], [address] [place], [place] [address]  Glucose, capillary     Status: Abnormal   Collection Time: 12/06/22  1:52 PM  Result Value Ref Range   Glucose-Capillary 108 (H) 70 - 99 mg/dL    Comment: Glucose reference range applies only to samples taken after fasting for at least 8 hours.  Glucose, capillary     Status: Abnormal   Collection Time: 12/06/22  4:32 PM  Result Value Ref Range   Glucose-Capillary 113 (H) 70 - 99 mg/dL    Comment: Glucose reference range applies only to samples taken after fasting for at least 8 hours.  Glucose, capillary     Status: Abnormal   Collection Time: 12/06/22  8:07 PM  Result Value Ref Range   Glucose-Capillary 100 (H) 70 - 99 mg/dL    Comment: Glucose reference range applies only to samples taken after fasting for at least 8 hours.   Glucose, capillary     Status: Abnormal   Collection Time: 12/07/22 12:13 AM  Result Value Ref Range   Glucose-Capillary 105 (H) 70 - 99 mg/dL    Comment: Glucose reference range applies only to samples taken after fasting for at least 8 hours.  Glucose, capillary     Status: None   Collection Time: 12/07/22  5:35 AM  Result Value Ref Range   Glucose-Capillary 90 70 - 99 mg/dL    Comment: Glucose reference range applies only to samples taken after fasting for at least 8 hours.  Glucose, capillary     Status: Abnormal   Collection Time: 12/07/22  7:19 AM  Result Value Ref Range   Glucose-Capillary 112 (H) 70 - 99 mg/dL    Comment: Glucose reference range applies only to samples taken after fasting for at least 8 hours.  Glucose, capillary     Status: None   Collection Time: 12/07/22 11:39 AM  Result Value Ref Range   Glucose-Capillary 99 70 - 99 mg/dL    Comment: Glucose reference range applies only to samples taken after fasting for at least 8 hours.  Glucose, capillary     Status: Abnormal   Collection Time: 12/07/22  4:05 PM  Result Value Ref Range   Glucose-Capillary 100 (H) 70 - 99 mg/dL    Comment: Glucose reference range applies only to samples taken after fasting for at least 8 hours.  Protime-INR     Status: Abnormal   Collection Time: 12/07/22  6:06 PM  Result Value Ref Range   Prothrombin Time 18.6 (H) 11.4 - 15.2 seconds   INR 1.5 (H) 0.8 - 1.2    Comment: (NOTE) INR goal varies based on device and disease states. Performed at [facility], [address] [place], [place] [address]   HCV RNA quant     Status: None   Collection Time: 12/07/22  6:06 PM  Result Value Ref Range   HCV Quantitative HCV Not Detected >50 IU/mL   Test Information Comment     Comment: (NOTE) The quantitative range of this assay is 15 IU/mL to 100 million IU/mL. Performed At: [facility] [address] [place], [place] [id number] Jolene Schimke MD [id number]    Glucose, capillary  Status: Abnormal   Collection Time: 12/07/22  7:46 PM  Result Value Ref Range   Glucose-Capillary 102 (H) 70 - 99 mg/dL    Comment: Glucose reference range applies only to samples taken after fasting for at least 8 hours.  Glucose, capillary     Status: Abnormal   Collection Time: 12/07/22 11:50 PM  Result Value Ref Range   Glucose-Capillary 102 (H) 70 - 99 mg/dL    Comment: Glucose reference range applies only to samples taken after fasting for at least 8 hours.  Glucose, capillary     Status: None   Collection Time: 12/08/22  5:18 AM  Result Value Ref Range   Glucose-Capillary 85 70 - 99 mg/dL    Comment: Glucose reference range applies only to samples taken after fasting for at least 8 hours.  Glucose, capillary     Status: None   Collection Time: 12/08/22  7:42 AM  Result Value Ref Range   Glucose-Capillary 91 70 - 99 mg/dL    Comment: Glucose reference range applies only to samples taken after fasting for at least 8 hours.  Comprehensive metabolic panel     Status: Abnormal   Collection Time: 12/08/22  8:00 AM  Result Value Ref Range   Sodium 137 135 - 145 mmol/L   Potassium 3.6 3.5 - 5.1 mmol/L   Chloride 107 98 - 111 mmol/L   CO2 23 22 - 32 mmol/L   Glucose, Bld 96 70 - 99 mg/dL    Comment: Glucose reference range applies only to samples taken after fasting for at least 8 hours.   BUN 15 6 - 20 mg/dL   Creatinine, Ser 1.61 (L) 0.61 - 1.24 mg/dL   Calcium 8.2 (L) 8.9 - 10.3 mg/dL   Total Protein 7.7 6.5 - 8.1 g/dL   Albumin 2.3 (L) 3.5 - 5.0 g/dL   AST 58 (H) 15 - 41 U/L   ALT 26 0 - 44 U/L   Alkaline Phosphatase 100 38 - 126 U/L   Total Bilirubin 0.9 0.3 - 1.2 mg/dL   GFR, Estimated >09 >60 mL/min    Comment: (NOTE) Calculated using the CKD-EPI Creatinine Equation (2021)    Anion gap 7 5 - 15    Comment: Performed at [facility], [address] [place], [place] [address]  CBC     Status: Abnormal   Collection Time: 12/08/22   8:00 AM  Result Value Ref Range   WBC 8.2 4.0 - 10.5 K/uL   RBC 3.76 (L) 4.22 - 5.81 MIL/uL   Hemoglobin 12.1 (L) 13.0 - 17.0 g/dL   HCT 81.1 (L) 91.4 - 78.2 %   MCV 93.4 80.0 - 100.0 fL   MCH 32.2 26.0 - 34.0 pg   MCHC 34.5 30.0 - 36.0 g/dL   RDW 95.6 21.3 - 08.6 %   Platelets 348 150 - 400 K/uL   nRBC 0.0 0.0 - 0.2 %    Comment: Performed at [facility], [address] [place], [place] [address]  Glucose, capillary     Status: None   Collection Time: 12/08/22 11:54 AM  Result Value Ref Range   Glucose-Capillary 99 70 - 99 mg/dL    Comment: Glucose reference range applies only to samples taken after fasting for at least 8 hours.  Magnesium     Status: None   Collection Time: 12/08/22  4:28 PM  Result Value Ref Range   Magnesium 1.7 1.7 - 2.4 mg/dL    Comment: Performed  at [facility], [address] [place], [place] [address]  Phosphorus     Status: None   Collection Time: 12/08/22  4:28 PM  Result Value Ref Range   Phosphorus 3.9 2.5 - 4.6 mg/dL    Comment: Performed at [facility], [address] [place], [place] [address]  Glucose, capillary     Status: None   Collection Time: 12/08/22  5:10 PM  Result Value Ref Range   Glucose-Capillary 95 70 - 99 mg/dL    Comment: Glucose reference range applies only to samples taken after fasting for at least 8 hours.  Glucose, capillary     Status: Abnormal   Collection Time: 12/08/22  8:04 PM  Result Value Ref Range   Glucose-Capillary 115 (H) 70 - 99 mg/dL    Comment: Glucose reference range applies only to samples taken after fasting for at least 8 hours.  Glucose, capillary     Status: Abnormal   Collection Time: 12/08/22 11:50 PM  Result Value Ref Range   Glucose-Capillary 111 (H) 70 - 99 mg/dL    Comment: Glucose reference range applies only to samples taken after fasting for at least 8 hours.  Glucose, capillary     Status: Abnormal   Collection Time: 12/09/22  4:17 AM  Result Value Ref  Range   Glucose-Capillary 119 (H) 70 - 99 mg/dL    Comment: Glucose reference range applies only to samples taken after fasting for at least 8 hours.  Comprehensive metabolic panel     Status: Abnormal   Collection Time: 12/09/22  4:55 AM  Result Value Ref Range   Sodium 133 (L) 135 - 145 mmol/L   Potassium 3.5 3.5 - 5.1 mmol/L   Chloride 106 98 - 111 mmol/L   CO2 22 22 - 32 mmol/L   Glucose, Bld 108 (H) 70 - 99 mg/dL    Comment: Glucose reference range applies only to samples taken after fasting for at least 8 hours.   BUN 12 6 - 20 mg/dL   Creatinine, Ser 0.98 (L) 0.61 - 1.24 mg/dL   Calcium 7.9 (L) 8.9 - 10.3 mg/dL   Total Protein 7.4 6.5 - 8.1 g/dL   Albumin 2.1 (L) 3.5 - 5.0 g/dL   AST 59 (H) 15 - 41 U/L   ALT 25 0 - 44 U/L   Alkaline Phosphatase 97 38 - 126 U/L   Total Bilirubin 1.0 0.3 - 1.2 mg/dL   GFR, Estimated >11 >91 mL/min    Comment: (NOTE) Calculated using the CKD-EPI Creatinine Equation (2021)    Anion gap 5 5 - 15    Comment: Performed at [facility], [address] [place], [place] [address]  Ammonia     Status: None   Collection Time: 12/09/22  4:55 AM  Result Value Ref Range   Ammonia 29 9 - 35 umol/L    Comment: Performed at [facility], [address] [place], [place] [address]  CBC     Status: Abnormal   Collection Time: 12/09/22  4:55 AM  Result Value Ref Range   WBC 9.3 4.0 - 10.5 K/uL   RBC 3.59 (L) 4.22 - 5.81 MIL/uL   Hemoglobin 11.6 (L) 13.0 - 17.0 g/dL   HCT 08.6 (L) 57.8 - 46.9 %   MCV 93.9 80.0 - 100.0 fL   MCH 32.3 26.0 - 34.0 pg   MCHC 34.4 30.0 - 36.0 g/dL   RDW 62.9 52.8 - 41.3 %   Platelets  318 150 - 400 K/uL   nRBC 0.0 0.0 - 0.2 %    Comment: Performed at [facility], [address] [place], [place] [address]  Magnesium     Status: Abnormal   Collection Time: 12/09/22  4:55 AM  Result Value Ref Range   Magnesium 1.6 (L) 1.7 - 2.4 mg/dL    Comment: Performed at [facility], [address] [place], [place] [address]  Phosphorus     Status: None   Collection Time: 12/09/22  4:55 AM  Result Value Ref Range   Phosphorus 3.0 2.5 - 4.6 mg/dL    Comment: Performed at [facility], [address] [place], [place] [address]  Glucose, capillary     Status: Abnormal   Collection Time: 12/09/22  7:55 AM  Result Value Ref Range   Glucose-Capillary 102 (H) 70 - 99 mg/dL    Comment: Glucose reference range applies only to samples taken after fasting for at least 8 hours.  Glucose, capillary     Status: Abnormal   Collection Time: 12/09/22 11:57 AM  Result Value Ref Range   Glucose-Capillary 125 (H) 70 - 99 mg/dL    Comment: Glucose reference range applies only to samples taken after fasting for at least 8 hours.  Glucose, capillary     Status: Abnormal   Collection Time: 12/09/22  4:01 PM  Result Value Ref Range   Glucose-Capillary 126 (H) 70 - 99 mg/dL    Comment: Glucose reference range applies only to samples taken after fasting for at least 8 hours.  Magnesium     Status: None   Collection Time: 12/09/22  4:49 PM  Result Value Ref Range   Magnesium 1.7 1.7 - 2.4 mg/dL    Comment: Performed at [facility], [address] [place], [place] [address]  Phosphorus     Status: None   Collection Time: 12/09/22  4:49 PM  Result Value Ref Range   Phosphorus 3.8 2.5 - 4.6 mg/dL    Comment: Performed at [facility] [address] [place], [place] [address]  Glucose, capillary     Status: None   Collection Time: 12/09/22  8:23 PM  Result Value Ref Range   Glucose-Capillary 97 70 - 99 mg/dL    Comment: Glucose reference range applies only to samples taken after fasting for at least 8 hours.  Glucose, capillary     Status: Abnormal   Collection Time: 12/10/22 12:01 AM  Result Value Ref Range   Glucose-Capillary 112 (H) 70 - 99 mg/dL    Comment: Glucose reference range applies only to samples taken after fasting for at least 8 hours.   Magnesium     Status: None   Collection Time: 12/10/22  4:58 AM  Result Value Ref Range   Magnesium 1.7 1.7 - 2.4 mg/dL    Comment: Performed at [facility], [address] [place], [place] [address]  Phosphorus     Status: None   Collection Time: 12/10/22  4:58 AM  Result Value Ref Range   Phosphorus 3.0 2.5 - 4.6 mg/dL    Comment: Performed at [facility], [address] [place], [place] [address]  CBC     Status: Abnormal   Collection Time: 12/10/22  4:58 AM  Result Value Ref Range   WBC 10.5 4.0 - 10.5 K/uL   RBC 3.61 (L) 4.22 - 5.81 MIL/uL   Hemoglobin 11.5 (L) 13.0 - 17.0 g/dL   HCT 32.4 (  L) 39.0 - 52.0 %   MCV 93.4 80.0 - 100.0 fL   MCH 31.9 26.0 - 34.0 pg   MCHC 34.1 30.0 - 36.0 g/dL   RDW 16.1 09.6 - 04.5 %   Platelets 313 150 - 400 K/uL   nRBC 0.0 0.0 - 0.2 %    Comment: Performed at [facility], [address], [place], [place] [address]  Basic metabolic panel     Status: Abnormal   Collection Time: 12/10/22  4:58 AM  Result Value Ref Range   Sodium 131 (L) 135 - 145 mmol/L   Potassium 3.8 3.5 - 5.1 mmol/L   Chloride 102 98 - 111 mmol/L   CO2 26 22 - 32 mmol/L   Glucose, Bld 120 (H) 70 - 99 mg/dL    Comment: Glucose reference range applies only to samples taken after fasting for at least 8 hours.   BUN 13 6 - 20 mg/dL   Creatinine, Ser 1.91 (L) 0.61 - 1.24 mg/dL   Calcium 7.6 (L) 8.9 - 10.3 mg/dL   GFR, Estimated >47 >82 mL/min    Comment: (NOTE) Calculated using the CKD-EPI Creatinine Equation (2021)    Anion gap 3 (L) 5 - 15    Comment: Performed at [facility], [address] [place], [place] [address]  Glucose, capillary     Status: Abnormal   Collection Time: 12/10/22  5:37 AM  Result Value Ref Range   Glucose-Capillary 114 (H) 70 - 99 mg/dL    Comment: Glucose reference range applies only to samples taken after fasting for at least 8 hours.  Glucose, capillary     Status: Abnormal   Collection Time:  12/10/22  8:48 AM  Result Value Ref Range   Glucose-Capillary 108 (H) 70 - 99 mg/dL    Comment: Glucose reference range applies only to samples taken after fasting for at least 8 hours.  Glucose, capillary     Status: Abnormal   Collection Time: 12/10/22 11:35 AM  Result Value Ref Range   Glucose-Capillary 107 (H) 70 - 99 mg/dL    Comment: Glucose reference range applies only to samples taken after fasting for at least 8 hours.  Glucose, capillary     Status: Abnormal   Collection Time: 12/10/22  5:32 PM  Result Value Ref Range   Glucose-Capillary 135 (H) 70 - 99 mg/dL    Comment: Glucose reference range applies only to samples taken after fasting for at least 8 hours.  Glucose, capillary     Status: Abnormal   Collection Time: 12/10/22  7:51 PM  Result Value Ref Range   Glucose-Capillary 142 (H) 70 - 99 mg/dL    Comment: Glucose reference range applies only to samples taken after fasting for at least 8 hours.  Glucose, capillary     Status: Abnormal   Collection Time: 12/10/22 11:56 PM  Result Value Ref Range   Glucose-Capillary 104 (H) 70 - 99 mg/dL    Comment: Glucose reference range applies only to samples taken after fasting for at least 8 hours.  Glucose, capillary     Status: Abnormal   Collection Time: 12/11/22  5:04 AM  Result Value Ref Range   Glucose-Capillary 108 (H) 70 - 99 mg/dL    Comment: Glucose reference range applies only to samples taken after fasting for at least 8 hours.  Glucose, capillary     Status: Abnormal   Collection Time: 12/11/22  8:26 AM  Result Value Ref Range   Glucose-Capillary  129 (H) 70 - 99 mg/dL    Comment: Glucose reference range applies only to samples taken after fasting for at least 8 hours.  Glucose, capillary     Status: Abnormal   Collection Time: 12/11/22 11:45 AM  Result Value Ref Range   Glucose-Capillary 150 (H) 70 - 99 mg/dL    Comment: Glucose reference range applies only to samples taken after fasting for at least 8 hours.   Glucose, capillary     Status: Abnormal   Collection Time: 12/11/22  3:54 PM  Result Value Ref Range   Glucose-Capillary 114 (H) 70 - 99 mg/dL    Comment: Glucose reference range applies only to samples taken after fasting for at least 8 hours.  Glucose, capillary     Status: Abnormal   Collection Time: 12/11/22  8:54 PM  Result Value Ref Range   Glucose-Capillary 116 (H) 70 - 99 mg/dL    Comment: Glucose reference range applies only to samples taken after fasting for at least 8 hours.  Glucose, capillary     Status: Abnormal   Collection Time: 12/12/22 12:01 AM  Result Value Ref Range   Glucose-Capillary 135 (H) 70 - 99 mg/dL    Comment: Glucose reference range applies only to samples taken after fasting for at least 8 hours.  Glucose, capillary     Status: Abnormal   Collection Time: 12/12/22  4:07 AM  Result Value Ref Range   Glucose-Capillary 120 (H) 70 - 99 mg/dL    Comment: Glucose reference range applies only to samples taken after fasting for at least 8 hours.  Comprehensive metabolic panel     Status: Abnormal   Collection Time: 12/12/22  6:19 AM  Result Value Ref Range   Sodium 130 (L) 135 - 145 mmol/L   Potassium 4.1 3.5 - 5.1 mmol/L   Chloride 99 98 - 111 mmol/L   CO2 25 22 - 32 mmol/L   Glucose, Bld 113 (H) 70 - 99 mg/dL    Comment: Glucose reference range applies only to samples taken after fasting for at least 8 hours.   BUN 13 6 - 20 mg/dL   Creatinine, Ser 3.87 (L) 0.61 - 1.24 mg/dL   Calcium 8.1 (L) 8.9 - 10.3 mg/dL   Total Protein 7.9 6.5 - 8.1 g/dL   Albumin 2.2 (L) 3.5 - 5.0 g/dL   AST 52 (H) 15 - 41 U/L   ALT 27 0 - 44 U/L   Alkaline Phosphatase 109 38 - 126 U/L   Total Bilirubin 0.6 0.3 - 1.2 mg/dL   GFR, Estimated >56 >43 mL/min    Comment: (NOTE) Calculated using the CKD-EPI Creatinine Equation (2021)    Anion gap 6 5 - 15    Comment: Performed at [facility], [address] [place], [place] [address]  Ammonia     Status: Abnormal    Collection Time: 12/12/22  6:19 AM  Result Value Ref Range   Ammonia 37 (H) 9 - 35 umol/L    Comment: Performed at [facility], [address] [place], [place] [address]  CBC     Status: Abnormal   Collection Time: 12/12/22  6:19 AM  Result Value Ref Range   WBC 10.2 4.0 - 10.5 K/uL   RBC 3.69 (L) 4.22 - 5.81 MIL/uL   Hemoglobin 11.8 (L) 13.0 - 17.0 g/dL   HCT 60.6 (L) 30.1 - 60.1 %   MCV 92.7 80.0 - 100.0 fL   MCH 32.0 26.0 -  34.0 pg   MCHC 34.5 30.0 - 36.0 g/dL   RDW 86.5 78.4 - 69.6 %   Platelets 299 150 - 400 K/uL   nRBC 0.0 0.0 - 0.2 %    Comment: Performed at [facility], [address] [place], [place] [address]  Magnesium     Status: None   Collection Time: 12/12/22  6:19 AM  Result Value Ref Range   Magnesium 1.8 1.7 - 2.4 mg/dL    Comment: Performed at [facility], [address] [place], [place] [address]  Glucose, capillary     Status: Abnormal   Collection Time: 12/12/22  8:43 AM  Result Value Ref Range   Glucose-Capillary 140 (H) 70 - 99 mg/dL    Comment: Glucose reference range applies only to samples taken after fasting for at least 8 hours.  Glucose, capillary     Status: Abnormal   Collection Time: 12/12/22 12:32 PM  Result Value Ref Range   Glucose-Capillary 130 (H) 70 - 99 mg/dL    Comment: Glucose reference range applies only to samples taken after fasting for at least 8 hours.  Glucose, capillary     Status: Abnormal   Collection Time: 12/12/22  4:00 PM  Result Value Ref Range   Glucose-Capillary 110 (H) 70 - 99 mg/dL    Comment: Glucose reference range applies only to samples taken after fasting for at least 8 hours.  Glucose, capillary     Status: Abnormal   Collection Time: 12/12/22  8:12 PM  Result Value Ref Range   Glucose-Capillary 134 (H) 70 - 99 mg/dL    Comment: Glucose reference range applies only to samples taken after fasting for at least 8 hours.  Glucose, capillary     Status: Abnormal   Collection  Time: 12/13/22 12:28 AM  Result Value Ref Range   Glucose-Capillary 134 (H) 70 - 99 mg/dL    Comment: Glucose reference range applies only to samples taken after fasting for at least 8 hours.  Glucose, capillary     Status: Abnormal   Collection Time: 12/13/22  5:12 AM  Result Value Ref Range   Glucose-Capillary 119 (H) 70 - 99 mg/dL    Comment: Glucose reference range applies only to samples taken after fasting for at least 8 hours.  Glucose, capillary     Status: Abnormal   Collection Time: 12/13/22 11:46 AM  Result Value Ref Range   Glucose-Capillary 114 (H) 70 - 99 mg/dL    Comment: Glucose reference range applies only to samples taken after fasting for at least 8 hours.  Glucose, capillary     Status: Abnormal   Collection Time: 12/13/22  3:55 PM  Result Value Ref Range   Glucose-Capillary 160 (H) 70 - 99 mg/dL    Comment: Glucose reference range applies only to samples taken after fasting for at least 8 hours.  Glucose, capillary     Status: Abnormal   Collection Time: 12/13/22  8:31 PM  Result Value Ref Range   Glucose-Capillary 129 (H) 70 - 99 mg/dL    Comment: Glucose reference range applies only to samples taken after fasting for at least 8 hours.  Glucose, capillary     Status: Abnormal   Collection Time: 12/14/22 12:15 AM  Result Value Ref Range   Glucose-Capillary 127 (H) 70 - 99 mg/dL    Comment: Glucose reference range applies only to samples taken after fasting for at least 8 hours.  CBC with Differential/Platelet  Status: Abnormal   Collection Time: 12/14/22  5:54 AM  Result Value Ref Range   WBC 9.1 4.0 - 10.5 K/uL   RBC 3.65 (L) 4.22 - 5.81 MIL/uL   Hemoglobin 11.6 (L) 13.0 - 17.0 g/dL   HCT 54.0 (L) 98.1 - 19.1 %   MCV 91.8 80.0 - 100.0 fL   MCH 31.8 26.0 - 34.0 pg   MCHC 34.6 30.0 - 36.0 g/dL   RDW 47.8 29.5 - 62.1 %   Platelets 251 150 - 400 K/uL   nRBC 0.0 0.0 - 0.2 %   Neutrophils Relative % 68 %   Neutro Abs 6.2 1.7 - 7.7 K/uL   Lymphocytes  Relative 19 %   Lymphs Abs 1.7 0.7 - 4.0 K/uL   Monocytes Relative 9 %   Monocytes Absolute 0.8 0.1 - 1.0 K/uL   Eosinophils Relative 3 %   Eosinophils Absolute 0.3 0.0 - 0.5 K/uL   Basophils Relative 1 %   Basophils Absolute 0.1 0.0 - 0.1 K/uL   Immature Granulocytes 0 %   Abs Immature Granulocytes 0.02 0.00 - 0.07 K/uL    Comment: Performed at [facility], [address] [place], [place] [address]  Basic metabolic panel     Status: Abnormal   Collection Time: 12/14/22  5:54 AM  Result Value Ref Range   Sodium 131 (L) 135 - 145 mmol/L   Potassium 4.2 3.5 - 5.1 mmol/L   Chloride 97 (L) 98 - 111 mmol/L   CO2 27 22 - 32 mmol/L   Glucose, Bld 124 (H) 70 - 99 mg/dL    Comment: Glucose reference range applies only to samples taken after fasting for at least 8 hours.   BUN 14 6 - 20 mg/dL   Creatinine, Ser 7.84 (L) 0.61 - 1.24 mg/dL   Calcium 8.1 (L) 8.9 - 10.3 mg/dL   GFR, Estimated >69 >62 mL/min    Comment: (NOTE) Calculated using the CKD-EPI Creatinine Equation (2021)    Anion gap 7 5 - 15    Comment: Performed at [facility], [address] [place], [place] [address]  Glucose, capillary     Status: Abnormal   Collection Time: 12/14/22  7:32 AM  Result Value Ref Range   Glucose-Capillary 146 (H) 70 - 99 mg/dL    Comment: Glucose reference range applies only to samples taken after fasting for at least 8 hours.  Glucose, capillary     Status: Abnormal   Collection Time: 12/14/22 11:43 AM  Result Value Ref Range   Glucose-Capillary 118 (H) 70 - 99 mg/dL    Comment: Glucose reference range applies only to samples taken after fasting for at least 8 hours.  Glucose, capillary     Status: Abnormal   Collection Time: 12/14/22  3:51 PM  Result Value Ref Range   Glucose-Capillary 127 (H) 70 - 99 mg/dL    Comment: Glucose reference range applies only to samples taken after fasting for at least 8 hours.  Glucose, capillary     Status: Abnormal   Collection Time:  12/14/22  4:03 PM  Result Value Ref Range   Glucose-Capillary 126 (H) 70 - 99 mg/dL    Comment: Glucose reference range applies only to samples taken after fasting for at least 8 hours.  Glucose, capillary     Status: Abnormal   Collection Time: 12/14/22  9:06 PM  Result Value Ref Range   Glucose-Capillary 141 (H) 70 - 99 mg/dL    Comment: Glucose  reference range applies only to samples taken after fasting for at least 8 hours.  Glucose, capillary     Status: Abnormal   Collection Time: 12/14/22 11:50 PM  Result Value Ref Range   Glucose-Capillary 129 (H) 70 - 99 mg/dL    Comment: Glucose reference range applies only to samples taken after fasting for at least 8 hours.  CBC with Differential/Platelet     Status: Abnormal   Collection Time: 12/15/22  4:49 AM  Result Value Ref Range   WBC 7.2 4.0 - 10.5 K/uL   RBC 3.53 (L) 4.22 - 5.81 MIL/uL   Hemoglobin 11.5 (L) 13.0 - 17.0 g/dL   HCT 62.9 (L) 52.8 - 41.3 %   MCV 93.5 80.0 - 100.0 fL   MCH 32.6 26.0 - 34.0 pg   MCHC 34.8 30.0 - 36.0 g/dL   RDW 24.4 01.0 - 27.2 %   Platelets 235 150 - 400 K/uL   nRBC 0.0 0.0 - 0.2 %   Neutrophils Relative % 60 %   Neutro Abs 4.3 1.7 - 7.7 K/uL   Lymphocytes Relative 23 %   Lymphs Abs 1.7 0.7 - 4.0 K/uL   Monocytes Relative 11 %   Monocytes Absolute 0.8 0.1 - 1.0 K/uL   Eosinophils Relative 5 %   Eosinophils Absolute 0.4 0.0 - 0.5 K/uL   Basophils Relative 1 %   Basophils Absolute 0.1 0.0 - 0.1 K/uL   Immature Granulocytes 0 %   Abs Immature Granulocytes 0.02 0.00 - 0.07 K/uL    Comment: Performed at [facility], [address], [place], [place] [address]  Basic metabolic panel     Status: Abnormal   Collection Time: 12/15/22  4:49 AM  Result Value Ref Range   Sodium 134 (L) 135 - 145 mmol/L   Potassium 3.9 3.5 - 5.1 mmol/L   Chloride 99 98 - 111 mmol/L   CO2 28 22 - 32 mmol/L   Glucose, Bld 126 (H) 70 - 99 mg/dL    Comment: Glucose reference range applies only to samples taken  after fasting for at least 8 hours.   BUN 15 6 - 20 mg/dL   Creatinine, Ser 4.03 (L) 0.61 - 1.24 mg/dL   Calcium 8.3 (L) 8.9 - 10.3 mg/dL   GFR, Estimated >47 >42 mL/min    Comment: (NOTE) Calculated using the CKD-EPI Creatinine Equation (2021)    Anion gap 7 5 - 15    Comment: Performed at [facility], [address] [place], [place] [address]  Glucose, capillary     Status: Abnormal   Collection Time: 12/15/22  5:17 AM  Result Value Ref Range   Glucose-Capillary 129 (H) 70 - 99 mg/dL    Comment: Glucose reference range applies only to samples taken after fasting for at least 8 hours.  Glucose, capillary     Status: None   Collection Time: 12/15/22  7:24 AM  Result Value Ref Range   Glucose-Capillary 96 70 - 99 mg/dL    Comment: Glucose reference range applies only to samples taken after fasting for at least 8 hours.  Glucose, capillary     Status: Abnormal   Collection Time: 12/15/22 12:02 PM  Result Value Ref Range   Glucose-Capillary 129 (H) 70 - 99 mg/dL    Comment: Glucose reference range applies only to samples taken after fasting for at least 8 hours.  Glucose, capillary     Status: Abnormal   Collection Time: 12/15/22  4:53 PM  Result Value Ref Range   Glucose-Capillary 135 (H) 70 - 99 mg/dL    Comment: Glucose reference range applies only to samples taken after fasting for at least 8 hours.  Glucose, capillary     Status: Abnormal   Collection Time: 12/15/22  8:03 PM  Result Value Ref Range   Glucose-Capillary 160 (H) 70 - 99 mg/dL    Comment: Glucose reference range applies only to samples taken after fasting for at least 8 hours.  Glucose, capillary     Status: Abnormal   Collection Time: 12/16/22 12:03 AM  Result Value Ref Range   Glucose-Capillary 132 (H) 70 - 99 mg/dL    Comment: Glucose reference range applies only to samples taken after fasting for at least 8 hours.  Glucose, capillary     Status: None   Collection Time: 12/16/22  4:17 AM  Result  Value Ref Range   Glucose-Capillary 77 70 - 99 mg/dL    Comment: Glucose reference range applies only to samples taken after fasting for at least 8 hours.  CBC with Differential/Platelet     Status: Abnormal   Collection Time: 12/16/22  4:35 AM  Result Value Ref Range   WBC 10.0 4.0 - 10.5 K/uL   RBC 3.65 (L) 4.22 - 5.81 MIL/uL   Hemoglobin 11.7 (L) 13.0 - 17.0 g/dL   HCT 96.2 (L) 95.2 - 84.1 %   MCV 92.6 80.0 - 100.0 fL   MCH 32.1 26.0 - 34.0 pg   MCHC 34.6 30.0 - 36.0 g/dL   RDW 32.4 40.1 - 02.7 %   Platelets 249 150 - 400 K/uL   nRBC 0.0 0.0 - 0.2 %   Neutrophils Relative % 64 %   Neutro Abs 6.4 1.7 - 7.7 K/uL   Lymphocytes Relative 23 %   Lymphs Abs 2.3 0.7 - 4.0 K/uL   Monocytes Relative 10 %   Monocytes Absolute 1.0 0.1 - 1.0 K/uL   Eosinophils Relative 2 %   Eosinophils Absolute 0.2 0.0 - 0.5 K/uL   Basophils Relative 1 %   Basophils Absolute 0.1 0.0 - 0.1 K/uL   Immature Granulocytes 0 %   Abs Immature Granulocytes 0.03 0.00 - 0.07 K/uL    Comment: Performed at [facility], [address] [place], [place] [address]  Basic metabolic panel     Status: Abnormal   Collection Time: 12/16/22  4:35 AM  Result Value Ref Range   Sodium 134 (L) 135 - 145 mmol/L   Potassium 3.7 3.5 - 5.1 mmol/L   Chloride 103 98 - 111 mmol/L   CO2 25 22 - 32 mmol/L   Glucose, Bld 80 70 - 99 mg/dL    Comment: Glucose reference range applies only to samples taken after fasting for at least 8 hours.   BUN 18 6 - 20 mg/dL   Creatinine, Ser 4.40 (L) 0.61 - 1.24 mg/dL   Calcium 8.4 (L) 8.9 - 10.3 mg/dL   GFR, Estimated >34 >74 mL/min    Comment: (NOTE) Calculated using the CKD-EPI Creatinine Equation (2021)    Anion gap 6 5 - 15    Comment: Performed at [facility], [address] [place], [place] [address]  Glucose, capillary     Status: Abnormal   Collection Time: 12/16/22  7:48 AM  Result Value Ref Range   Glucose-Capillary 156 (H) 70 - 99 mg/dL    Comment: Glucose  reference range applies only to samples taken after fasting for at least 8  hours.  Glucose, capillary     Status: Abnormal   Collection Time: 12/16/22 11:36 AM  Result Value Ref Range   Glucose-Capillary 109 (H) 70 - 99 mg/dL    Comment: Glucose reference range applies only to samples taken after fasting for at least 8 hours.  Glucose, capillary     Status: Abnormal   Collection Time: 12/16/22  4:43 PM  Result Value Ref Range   Glucose-Capillary 118 (H) 70 - 99 mg/dL    Comment: Glucose reference range applies only to samples taken after fasting for at least 8 hours.  Glucose, capillary     Status: None   Collection Time: 12/16/22  9:02 PM  Result Value Ref Range   Glucose-Capillary 97 70 - 99 mg/dL    Comment: Glucose reference range applies only to samples taken after fasting for at least 8 hours.  Glucose, capillary     Status: Abnormal   Collection Time: 12/16/22 11:43 PM  Result Value Ref Range   Glucose-Capillary 112 (H) 70 - 99 mg/dL    Comment: Glucose reference range applies only to samples taken after fasting for at least 8 hours.  Glucose, capillary     Status: None   Collection Time: 12/17/22  3:10 AM  Result Value Ref Range   Glucose-Capillary 99 70 - 99 mg/dL    Comment: Glucose reference range applies only to samples taken after fasting for at least 8 hours.  Basic metabolic panel     Status: Abnormal   Collection Time: 12/17/22  4:26 AM  Result Value Ref Range   Sodium 135 135 - 145 mmol/L   Potassium 3.9 3.5 - 5.1 mmol/L   Chloride 104 98 - 111 mmol/L   CO2 23 22 - 32 mmol/L   Glucose, Bld 107 (H) 70 - 99 mg/dL    Comment: Glucose reference range applies only to samples taken after fasting for at least 8 hours.   BUN 18 6 - 20 mg/dL   Creatinine, Ser 9.60 0.61 - 1.24 mg/dL   Calcium 8.7 (L) 8.9 - 10.3 mg/dL   GFR, Estimated >45 >40 mL/min    Comment: (NOTE) Calculated using the CKD-EPI Creatinine Equation (2021)    Anion gap 8 5 - 15    Comment: Performed  at [facility], [address] [place], [place] [address]  Glucose, capillary     Status: Abnormal   Collection Time: 12/17/22  7:13 AM  Result Value Ref Range   Glucose-Capillary 107 (H) 70 - 99 mg/dL    Comment: Glucose reference range applies only to samples taken after fasting for at least 8 hours.  Glucose, capillary     Status: Abnormal   Collection Time: 12/17/22 11:53 AM  Result Value Ref Range   Glucose-Capillary 103 (H) 70 - 99 mg/dL    Comment: Glucose reference range applies only to samples taken after fasting for at least 8 hours.  Glucose, capillary     Status: Abnormal   Collection Time: 12/17/22  3:55 PM  Result Value Ref Range   Glucose-Capillary 132 (H) 70 - 99 mg/dL    Comment: Glucose reference range applies only to samples taken after fasting for at least 8 hours.  Glucose, capillary     Status: None   Collection Time: 12/17/22  8:05 PM  Result Value Ref Range   Glucose-Capillary 99 70 - 99 mg/dL    Comment: Glucose reference range applies only to samples taken after fasting for at least 8 hours.  Glucose, capillary     Status: Abnormal   Collection Time: 12/17/22 11:57 PM  Result Value Ref Range   Glucose-Capillary 151 (H) 70 - 99 mg/dL    Comment: Glucose reference range applies only to samples taken after fasting for at least 8 hours.  Glucose, capillary     Status: None   Collection Time: 12/18/22  4:04 AM  Result Value Ref Range   Glucose-Capillary 90 70 - 99 mg/dL    Comment: Glucose reference range applies only to samples taken after fasting for at least 8 hours.  CBC with Differential/Platelet     Status: Abnormal   Collection Time: 12/18/22  4:21 AM  Result Value Ref Range   WBC 10.9 (H) 4.0 - 10.5 K/uL   RBC 3.76 (L) 4.22 - 5.81 MIL/uL   Hemoglobin 11.9 (L) 13.0 - 17.0 g/dL   HCT 28.4 (L) 13.2 - 44.0 %   MCV 92.0 80.0 - 100.0 fL   MCH 31.6 26.0 - 34.0 pg   MCHC 34.4 30.0 - 36.0 g/dL   RDW 10.2 72.5 - 36.6 %   Platelets 235 150 - 400  K/uL   nRBC 0.0 0.0 - 0.2 %   Neutrophils Relative % 57 %   Neutro Abs 6.2 1.7 - 7.7 K/uL   Lymphocytes Relative 28 %   Lymphs Abs 3.1 0.7 - 4.0 K/uL   Monocytes Relative 8 %   Monocytes Absolute 0.9 0.1 - 1.0 K/uL   Eosinophils Relative 6 %   Eosinophils Absolute 0.6 (H) 0.0 - 0.5 K/uL   Basophils Relative 1 %   Basophils Absolute 0.1 0.0 - 0.1 K/uL   Immature Granulocytes 0 %   Abs Immature Granulocytes 0.04 0.00 - 0.07 K/uL    Comment: Performed at [facility], [address] [place], [place] [address]  Basic metabolic panel     Status: Abnormal   Collection Time: 12/18/22  4:21 AM  Result Value Ref Range   Sodium 132 (L) 135 - 145 mmol/L   Potassium 4.0 3.5 - 5.1 mmol/L   Chloride 101 98 - 111 mmol/L   CO2 25 22 - 32 mmol/L   Glucose, Bld 93 70 - 99 mg/dL    Comment: Glucose reference range applies only to samples taken after fasting for at least 8 hours.   BUN 19 6 - 20 mg/dL   Creatinine, Ser 7.42 (L) 0.61 - 1.24 mg/dL   Calcium 8.3 (L) 8.9 - 10.3 mg/dL   GFR, Estimated >59 >56 mL/min    Comment: (NOTE) Calculated using the CKD-EPI Creatinine Equation (2021)    Anion gap 6 5 - 15    Comment: Performed at [facility], [address] [place], [place] [address]  Glucose, capillary     Status: Abnormal   Collection Time: 12/18/22  7:55 AM  Result Value Ref Range   Glucose-Capillary 152 (H) 70 - 99 mg/dL    Comment: Glucose reference range applies only to samples taken after fasting for at least 8 hours.  Glucose, capillary     Status: Abnormal   Collection Time: 12/18/22 11:52 AM  Result Value Ref Range   Glucose-Capillary 118 (H) 70 - 99 mg/dL    Comment: Glucose reference range applies only to samples taken after fasting for at least 8 hours.  Glucose, capillary     Status: Abnormal   Collection Time: 12/18/22  5:24 PM  Result Value Ref Range   Glucose-Capillary 101 (H) 70 - 99 mg/dL  Comment: Glucose reference range applies only to samples  taken after fasting for at least 8 hours.  Glucose, capillary     Status: None   Collection Time: 12/18/22  8:21 PM  Result Value Ref Range   Glucose-Capillary 83 70 - 99 mg/dL    Comment: Glucose reference range applies only to samples taken after fasting for at least 8 hours.  Glucose, capillary     Status: Abnormal   Collection Time: 12/18/22 11:54 PM  Result Value Ref Range   Glucose-Capillary 111 (H) 70 - 99 mg/dL    Comment: Glucose reference range applies only to samples taken after fasting for at least 8 hours.  Glucose, capillary     Status: None   Collection Time: 12/19/22  4:09 AM  Result Value Ref Range   Glucose-Capillary 84 70 - 99 mg/dL    Comment: Glucose reference range applies only to samples taken after fasting for at least 8 hours.  CBC with Differential/Platelet     Status: Abnormal   Collection Time: 12/19/22  5:06 AM  Result Value Ref Range   WBC 8.7 4.0 - 10.5 K/uL   RBC 3.92 (L) 4.22 - 5.81 MIL/uL   Hemoglobin 12.4 (L) 13.0 - 17.0 g/dL   HCT 32.9 (L) 51.8 - 84.1 %   MCV 92.3 80.0 - 100.0 fL   MCH 31.6 26.0 - 34.0 pg   MCHC 34.3 30.0 - 36.0 g/dL   RDW 66.0 63.0 - 16.0 %   Platelets 235 150 - 400 K/uL   nRBC 0.0 0.0 - 0.2 %   Neutrophils Relative % 54 %   Neutro Abs 4.7 1.7 - 7.7 K/uL   Lymphocytes Relative 29 %   Lymphs Abs 2.5 0.7 - 4.0 K/uL   Monocytes Relative 9 %   Monocytes Absolute 0.8 0.1 - 1.0 K/uL   Eosinophils Relative 7 %   Eosinophils Absolute 0.6 (H) 0.0 - 0.5 K/uL   Basophils Relative 1 %   Basophils Absolute 0.1 0.0 - 0.1 K/uL   Immature Granulocytes 0 %   Abs Immature Granulocytes 0.02 0.00 - 0.07 K/uL    Comment: Performed at [facility], [address], [place], [place] [address]  Basic metabolic panel     Status: Abnormal   Collection Time: 12/19/22  5:06 AM  Result Value Ref Range   Sodium 133 (L) 135 - 145 mmol/L   Potassium 4.0 3.5 - 5.1 mmol/L   Chloride 98 98 - 111 mmol/L   CO2 27 22 - 32 mmol/L   Glucose, Bld  94 70 - 99 mg/dL    Comment: Glucose reference range applies only to samples taken after fasting for at least 8 hours.   BUN 12 6 - 20 mg/dL   Creatinine, Ser 3.55 (L) 0.61 - 1.24 mg/dL   Calcium 8.4 (L) 8.9 - 10.3 mg/dL   GFR, Estimated >73 >22 mL/min    Comment: (NOTE) Calculated using the CKD-EPI Creatinine Equation (2021)    Anion gap 8 5 - 15    Comment: Performed at [facility], [address] [place], [place] [address]  Glucose, capillary     Status: None   Collection Time: 12/19/22  8:44 AM  Result Value Ref Range   Glucose-Capillary 91 70 - 99 mg/dL    Comment: Glucose reference range applies only to samples taken after fasting for at least 8 hours.  Glucose, capillary     Status: Abnormal   Collection Time: 12/19/22 11:43 AM  Result Value Ref Range   Glucose-Capillary 131 (H) 70 - 99 mg/dL    Comment: Glucose reference range applies only to samples taken after fasting for at least 8 hours.  Glucose, capillary     Status: Abnormal   Collection Time: 12/19/22  4:20 PM  Result Value Ref Range   Glucose-Capillary 146 (H) 70 - 99 mg/dL    Comment: Glucose reference range applies only to samples taken after fasting for at least 8 hours.  Lipase, blood     Status: Abnormal   Collection Time: 12/26/22  2:38 PM  Result Value Ref Range   Lipase 556 (H) 11 - 51 U/L    Comment: RESULTS CONFIRMED BY MANUAL DILUTION MU Performed at [facility], [address] [place], [place] [address]   Comprehensive metabolic panel     Status: Abnormal   Collection Time: 12/26/22  2:38 PM  Result Value Ref Range   Sodium 134 (L) 135 - 145 mmol/L   Potassium 3.7 3.5 - 5.1 mmol/L   Chloride 101 98 - 111 mmol/L   CO2 25 22 - 32 mmol/L   Glucose, Bld 104 (H) 70 - 99 mg/dL    Comment: Glucose reference range applies only to samples taken after fasting for at least 8 hours.   BUN 12 6 - 20 mg/dL   Creatinine, Ser 4.09 (L) 0.61 - 1.24 mg/dL   Calcium 8.5 (L) 8.9 - 10.3  mg/dL   Total Protein 8.6 (H) 6.5 - 8.1 g/dL   Albumin 2.7 (L) 3.5 - 5.0 g/dL   AST 40 15 - 41 U/L   ALT 23 0 - 44 U/L   Alkaline Phosphatase 111 38 - 126 U/L   Total Bilirubin 0.4 0.3 - 1.2 mg/dL   GFR, Estimated >81 >19 mL/min    Comment: (NOTE) Calculated using the CKD-EPI Creatinine Equation (2021)    Anion gap 8 5 - 15    Comment: Performed at [facility], [address] [place], [place] [address]  CBC     Status: None   Collection Time: 12/26/22  2:38 PM  Result Value Ref Range   WBC 9.8 4.0 - 10.5 K/uL   RBC 4.27 4.22 - 5.81 MIL/uL   Hemoglobin 13.4 13.0 - 17.0 g/dL   HCT 95.6 21.3 - 08.6 %   MCV 93.0 80.0 - 100.0 fL   MCH 31.4 26.0 - 34.0 pg   MCHC 33.8 30.0 - 36.0 g/dL   RDW 57.8 46.9 - 62.9 %   Platelets 221 150 - 400 K/uL   nRBC 0.0 0.0 - 0.2 %    Comment: Performed at [facility], [address] [place], [place] [address]  Lipase, blood     Status: Abnormal   Collection Time: 01/02/23  8:29 PM  Result Value Ref Range   Lipase 834 (H) 11 - 51 U/L    Comment: RESULTS CONFIRMED BY MANUAL DILUTION mu Performed at [facility], [address], [place], [place] [address]   Comprehensive metabolic panel     Status: Abnormal   Collection Time: 01/02/23  8:29 PM  Result Value Ref Range   Sodium 136 135 - 145 mmol/L   Potassium 4.0 3.5 - 5.1 mmol/L   Chloride 101 98 - 111 mmol/L   CO2 27 22 - 32 mmol/L   Glucose, Bld 120 (H) 70 - 99 mg/dL    Comment: Glucose reference range applies only to samples taken after fasting for at least 8  hours.   BUN 9 6 - 20 mg/dL   Creatinine, Ser 9.14 0.61 - 1.24 mg/dL   Calcium 8.7 (L) 8.9 - 10.3 mg/dL   Total Protein 8.9 (H) 6.5 - 8.1 g/dL   Albumin 2.9 (L) 3.5 - 5.0 g/dL   AST 39 15 - 41 U/L   ALT 23 0 - 44 U/L   Alkaline Phosphatase 109 38 - 126 U/L   Total Bilirubin 0.6 0.3 - 1.2 mg/dL   GFR, Estimated >78 >29 mL/min    Comment: (NOTE) Calculated using the CKD-EPI Creatinine Equation  (2021)    Anion gap 8 5 - 15    Comment: Performed at [facility], [address] [place], [place] [address]  CBC     Status: None   Collection Time: 01/02/23  8:29 PM  Result Value Ref Range   WBC 8.5 4.0 - 10.5 K/uL   RBC 4.45 4.22 - 5.81 MIL/uL   Hemoglobin 13.5 13.0 - 17.0 g/dL   HCT 08.6 57.8 - 46.9 %   MCV 91.5 80.0 - 100.0 fL   MCH 30.3 26.0 - 34.0 pg   MCHC 33.2 30.0 - 36.0 g/dL   RDW 62.9 52.8 - 41.3 %   Platelets 254 150 - 400 K/uL   nRBC 0.0 0.0 - 0.2 %    Comment: Performed at [facility], [address] [place], [place] [address]  Lipase, blood     Status: Abnormal   Collection Time: 01/09/23  8:11 AM  Result Value Ref Range   Lipase 661 (H) 11 - 51 U/L    Comment: RESULT CONFIRMED BY MANUAL DILUTION  AB Performed at [facility], [address] [place], [place] [address]   Comprehensive metabolic panel     Status: Abnormal   Collection Time: 01/09/23  8:11 AM  Result Value Ref Range   Sodium 137 135 - 145 mmol/L   Potassium 3.6 3.5 - 5.1 mmol/L   Chloride 105 98 - 111 mmol/L   CO2 24 22 - 32 mmol/L   Glucose, Bld 120 (H) 70 - 99 mg/dL    Comment: Glucose reference range applies only to samples taken after fasting for at least 8 hours.   BUN 11 6 - 20 mg/dL   Creatinine, Ser 3.66 (L) 0.61 - 1.24 mg/dL   Calcium 8.5 (L) 8.9 - 10.3 mg/dL   Total Protein 8.6 (H) 6.5 - 8.1 g/dL   Albumin 2.8 (L) 3.5 - 5.0 g/dL   AST 29 15 - 41 U/L   ALT 14 0 - 44 U/L   Alkaline Phosphatase 91 38 - 126 U/L   Total Bilirubin 0.8 0.3 - 1.2 mg/dL   GFR, Estimated >44 >03 mL/min    Comment: (NOTE) Calculated using the CKD-EPI Creatinine Equation (2021)    Anion gap 8 5 - 15    Comment: Performed at [facility], [address] [place], [place] [address]  CBC     Status: Abnormal   Collection Time: 01/09/23  8:11 AM  Result Value Ref Range   WBC 8.0 4.0 - 10.5 K/uL   RBC 4.00 (L) 4.22 - 5.81 MIL/uL   Hemoglobin 12.2 (L) 13.0 - 17.0  g/dL   HCT 95.6 (L) 38.7 - 56.4 %   MCV 90.5 80.0 - 100.0 fL   MCH 30.5 26.0 - 34.0 pg   MCHC 33.7 30.0 - 36.0 g/dL   RDW 33.2 95.1 - 88.4 %   Platelets 258 150 -  400 K/uL   nRBC 0.0 0.0 - 0.2 %    Comment: Performed at [facility], [address] [place], [place] [address]  Magnesium     Status: Abnormal   Collection Time: 01/09/23  8:11 AM  Result Value Ref Range   Magnesium 1.6 (L) 1.7 - 2.4 mg/dL    Comment: Performed at [facility], [address] [place], [place] [address]  Phosphorus     Status: None   Collection Time: 01/09/23  8:11 AM  Result Value Ref Range   Phosphorus 4.4 2.5 - 4.6 mg/dL    Comment: Performed at [facility], [address] [place], [place] [address]  Triglycerides     Status: None   Collection Time: 01/09/23  8:11 AM  Result Value Ref Range   Triglycerides 50 <150 mg/dL    Comment: Performed at [facility], [address] [place], [place] [address]  Ammonia     Status: None   Collection Time: 01/09/23  2:18 PM  Result Value Ref Range   Ammonia 17 9 - 35 umol/L    Comment: Performed at [facility], [address] [place], [place] [address]  Urinalysis, Routine w reflex microscopic -Urine, Clean Catch     Status: Abnormal   Collection Time: 01/09/23  6:29 PM  Result Value Ref Range   Color, Urine YELLOW (A) YELLOW   APPearance CLEAR (A) CLEAR   Specific Gravity, Urine 1.036 (H) 1.005 - 1.030   pH 5.0 5.0 - 8.0   Glucose, UA NEGATIVE NEGATIVE mg/dL   Hgb urine dipstick NEGATIVE NEGATIVE   Bilirubin Urine NEGATIVE NEGATIVE   Ketones, ur NEGATIVE NEGATIVE mg/dL   Protein, ur NEGATIVE NEGATIVE mg/dL   Nitrite NEGATIVE NEGATIVE   Leukocytes,Ua NEGATIVE NEGATIVE    Comment: Performed at [facility], [address], [place], [place] [address]  C Difficile Quick Screen w PCR reflex     Status: None   Collection Time: 01/09/23  6:29 PM   Specimen: Urine, Clean Catch; Stool  Result  Value Ref Range   C Diff antigen NEGATIVE NEGATIVE   C Diff toxin NEGATIVE NEGATIVE   C Diff interpretation No C. difficile detected.     Comment: Performed at [facility], [address] [place], [place] [address]  Lipase, blood     Status: Abnormal   Collection Time: 01/10/23  7:05 AM  Result Value Ref Range   Lipase 280 (H) 11 - 51 U/L    Comment: Performed at [facility], [address] [place], [place] [address]  Basic metabolic panel     Status: Abnormal   Collection Time: 01/10/23  7:05 AM  Result Value Ref Range   Sodium 138 135 - 145 mmol/L   Potassium 3.4 (L) 3.5 - 5.1 mmol/L   Chloride 109 98 - 111 mmol/L   CO2 21 (L) 22 - 32 mmol/L   Glucose, Bld 83 70 - 99 mg/dL    Comment: Glucose reference range applies only to samples taken after fasting for at least 8 hours.   BUN 8 6 - 20 mg/dL   Creatinine, Ser 1.60 (L) 0.61 - 1.24 mg/dL   Calcium 7.9 (L) 8.9 - 10.3 mg/dL   GFR, Estimated >10 >93 mL/min    Comment: (NOTE) Calculated using the CKD-EPI Creatinine Equation (2021)    Anion gap 8 5 - 15    Comment: Performed at [facility], [address] [place], [place]  [address]  CBC     Status: Abnormal   Collection Time: 01/10/23  7:05 AM  Result Value Ref Range   WBC 6.5 4.0 - 10.5 K/uL   RBC 3.57 (L) 4.22 - 5.81 MIL/uL   Hemoglobin 10.9 (L) 13.0 - 17.0 g/dL   HCT 60.4 (L) 54.0 - 98.1 %   MCV 91.3 80.0 - 100.0 fL   MCH 30.5 26.0 - 34.0 pg   MCHC 33.4 30.0 - 36.0 g/dL   RDW 19.1 47.8 - 29.5 %   Platelets 215 150 - 400 K/uL   nRBC 0.0 0.0 - 0.2 %    Comment: Performed at [facility], [address] [place], [place] [address]  Glucose, capillary     Status: None   Collection Time: 01/10/23  8:01 AM  Result Value Ref Range   Glucose-Capillary 76 70 - 99 mg/dL    Comment: Glucose reference range applies only to samples taken after fasting for at least 8 hours.  Basic metabolic panel     Status: Abnormal   Collection Time:  01/11/23  5:27 AM  Result Value Ref Range   Sodium 137 135 - 145 mmol/L   Potassium 3.7 3.5 - 5.1 mmol/L   Chloride 107 98 - 111 mmol/L   CO2 23 22 - 32 mmol/L   Glucose, Bld 92 70 - 99 mg/dL    Comment: Glucose reference range applies only to samples taken after fasting for at least 8 hours.   BUN 6 6 - 20 mg/dL   Creatinine, Ser 8.65 (L) 0.61 - 1.24 mg/dL   Calcium 7.9 (L) 8.9 - 10.3 mg/dL   GFR, Estimated >78 >46 mL/min    Comment: (NOTE) Calculated using the CKD-EPI Creatinine Equation (2021)    Anion gap 7 5 - 15    Comment: Performed at [facility], [address] [place], [place] [address]  Glucose, capillary     Status: None   Collection Time: 01/11/23  9:03 AM  Result Value Ref Range   Glucose-Capillary 85 70 - 99 mg/dL    Comment: Glucose reference range applies only to samples taken after fasting for at least 8 hours.      Assessment & Plan:  Patient to continue all the medications as such.  Will return for follow-up in 3 months. Problem List Items Addressed This Visit     Acute alcoholic pancreatitis   GERD (gastroesophageal reflux disease) - Primary   Protein-calorie malnutrition, severe   GAD (generalized anxiety disorder)    Return in about 3 months (around 06/02/2023).   Total time spent: 25 minutes  Margaretann Loveless, MD  03/02/2023   This document may have been prepared by [facility] Voice Recognition software and as such may include unintentional dictation errors.

## 2023-03-08 ENCOUNTER — Encounter: Payer: Self-pay | Admitting: Family

## 2023-03-08 DIAGNOSIS — F5104 Psychophysiologic insomnia: Secondary | ICD-10-CM | POA: Insufficient documentation

## 2023-03-08 NOTE — Assessment & Plan Note (Signed)
Starting pt. On Trazodone.  Will have him follow up in 2 week so we can ensure this is working.

## 2023-03-08 NOTE — Assessment & Plan Note (Signed)
Famotidine has not been effective.  Will switch pt to pantoprazole.  Reassess at follow up.

## 2023-03-08 NOTE — Progress Notes (Signed)
Established Patient Office Visit  Subjective:  Patient ID: Luke Rogers., male    DOB: June 19, 1961  Age: 62 y.o. MRN: [id number]  Chief Complaint  Patient presents with   Acute Visit    Dry cough, not sleeping   Cough   Insomnia         Cough This is a new problem. The current episode started in the past 7 days. The problem has been gradually worsening. Episode frequency: nightly, but also occurs sporadically through the day. The cough is Non-productive. Associated symptoms include heartburn. Pertinent negatives include no chest pain, chills, fever, rhinorrhea, shortness of breath or weight loss. The symptoms are aggravated by lying down (eating). He has tried OTC cough suppressant, steroid inhaler, body position changes and rest for the symptoms. The treatment provided mild relief. His past medical history is significant for pneumonia.  Insomnia Primary symptoms: fragmented sleep, sleep disturbance.   The current episode started more than one year. The onset quality is undetermined. The problem occurs nightly. The problem is unchanged. The symptoms are aggravated by tobacco, alcohol and anxiety. Nothing relieves the symptoms. Past treatments include medication and alcohol (OTC Melatonin). The treatment provided no relief. Typical bedtime:  10-11 P.M..  How long after going to bed to you fall asleep: 15-30 minutes.   Prior diagnostic workup includes:  No prior workup.    No other concerns at this time.   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   Hyperbilirubinemia 12/18/2021   Hypomagnesemia 12/22/2021    Hyponatremia 12/18/2021   Mass of soft tissue of left lower extremity 10/03/2022   Metabolic acidosis 12/18/2021   Pancreatitis    PEG (percutaneous endoscopic gastrostomy) adjustment/replacement/removal (HCC) 01/16/2023   Status post insertion of percutaneous endoscopic gastrostomy (PEG) tube (HCC) 01/09/2023   Transaminitis 08/24/2014   Tremor 08/24/2014   Visual hallucination    Visual hallucinations 12/18/2021   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    Past Surgical History:  Procedure Laterality Date   FOOT SURGERY Bilateral    As infant   INSERTION OF MESH Bilateral 09/25/2022   Procedure: INSERTION OF MESH;  Surgeon: Campbell Lerner, MD;  Location: [facility] ORS;  Service: General;  Laterality: Bilateral;   PEG PLACEMENT N/A 12/08/2022   Procedure: PERCUTANEOUS ENDOSCOPIC GASTROSTOMY (PEG) PLACEMENT;  Surgeon: Toledo, Boykin Nearing, MD;  Location: [facility] ENDOSCOPY;  Service: Gastroenterology;  Laterality: N/A;   WRIST SURGERY Right     Social History   Socioeconomic History   Marital status: Single    Spouse name: Not on file   Number of children: Not on file   Years of education: Not on file   Highest education level: Not on file  Occupational History   Not on file  Tobacco Use   Smoking status: Every Day    Current packs/day: 0.50    Types: Cigarettes    Passive exposure: Past   Smokeless tobacco: Never  Vaping Use   Vaping status: Never Used  Substance and Sexual Activity   Alcohol use: Not Currently    Alcohol/week: 63.0 standard drinks of alcohol  Types: 63 Cans of beer per week   Drug use: Yes    Types: Amphetamines   Sexual activity: Yes  Other Topics Concern   Not on file  Social History Narrative   Not on file   Social Determinants of Health   Financial Resource Strain: Not on file  Food Insecurity: No Food Insecurity (01/09/2023)   Hunger Vital Sign    Worried About Running Out of Food in the Last Year: Never true    Ran Out of Food in the Last  Year: Never true  Transportation Needs: No Transportation Needs (01/09/2023)   PRAPARE - Administrator, Civil Service (Medical): No    Lack of Transportation (Non-Medical): No  Physical Activity: Not on file  Stress: Not on file  Social Connections: Not on file  Intimate Partner Violence: Not At Risk (01/09/2023)   Humiliation, Afraid, Rape, and Kick questionnaire    Fear of Current or Ex-Partner: No    Emotionally Abused: No    Physically Abused: No    Sexually Abused: No    Family History  Problem Relation Age of Onset   Hypertension Mother     Allergies  Allergen Reactions   Tramadol Nausea And Vomiting    Review of Systems  Constitutional:  Negative for chills, fever and weight loss.  HENT:  Negative for rhinorrhea.   Respiratory:  Positive for cough. Negative for shortness of breath.   Cardiovascular:  Negative for chest pain.  Gastrointestinal:  Positive for heartburn.  Psychiatric/Behavioral:  Positive for sleep disturbance. The patient has insomnia.        Objective:   BP (!) 80/65   Pulse 84   Ht 6\' 2"  (1.88 m)   Wt 147 lb 9.6 oz (67 kg)   SpO2 97%   BMI 18.95 kg/m   Vitals:   02/16/23 1104  BP: (!) 80/65  Pulse: 84  Height: 6\' 2"  (1.88 m)  Weight: 147 lb 9.6 oz (67 kg)  SpO2: 97%  BMI (Calculated): 18.94    Physical Exam   No results found for any visits on 02/16/23.  Recent Results (from the past 2160 hour(s))  Glucose, capillary     Status: None   Collection Time: 12/08/22 11:54 AM  Result Value Ref Range   Glucose-Capillary 99 70 - 99 mg/dL    Comment: Glucose reference range applies only to samples taken after fasting for at least 8 hours.  Magnesium     Status: None   Collection Time: 12/08/22  4:28 PM  Result Value Ref Range   Magnesium 1.7 1.7 - 2.4 mg/dL    Comment: Performed at [facility], [address] [place], [place] [address]  Phosphorus     Status: None   Collection Time: 12/08/22  4:28 PM   Result Value Ref Range   Phosphorus 3.9 2.5 - 4.6 mg/dL    Comment: Performed at [facility], [address] [place], [place] [address]  Glucose, capillary     Status: None   Collection Time: 12/08/22  5:10 PM  Result Value Ref Range   Glucose-Capillary 95 70 - 99 mg/dL    Comment: Glucose reference range applies only to samples taken after fasting for at least 8 hours.  Glucose, capillary     Status: Abnormal   Collection Time: 12/08/22  8:04 PM  Result Value Ref Range   Glucose-Capillary 115 (H) 70 - 99 mg/dL    Comment: Glucose reference range applies only to  samples taken after fasting for at least 8 hours.  Glucose, capillary     Status: Abnormal   Collection Time: 12/08/22 11:50 PM  Result Value Ref Range   Glucose-Capillary 111 (H) 70 - 99 mg/dL    Comment: Glucose reference range applies only to samples taken after fasting for at least 8 hours.  Glucose, capillary     Status: Abnormal   Collection Time: 12/09/22  4:17 AM  Result Value Ref Range   Glucose-Capillary 119 (H) 70 - 99 mg/dL    Comment: Glucose reference range applies only to samples taken after fasting for at least 8 hours.  Comprehensive metabolic panel     Status: Abnormal   Collection Time: 12/09/22  4:55 AM  Result Value Ref Range   Sodium 133 (L) 135 - 145 mmol/L   Potassium 3.5 3.5 - 5.1 mmol/L   Chloride 106 98 - 111 mmol/L   CO2 22 22 - 32 mmol/L   Glucose, Bld 108 (H) 70 - 99 mg/dL    Comment: Glucose reference range applies only to samples taken after fasting for at least 8 hours.   BUN 12 6 - 20 mg/dL   Creatinine, Ser 1.61 (L) 0.61 - 1.24 mg/dL   Calcium 7.9 (L) 8.9 - 10.3 mg/dL   Total Protein 7.4 6.5 - 8.1 g/dL   Albumin 2.1 (L) 3.5 - 5.0 g/dL   AST 59 (H) 15 - 41 U/L   ALT 25 0 - 44 U/L   Alkaline Phosphatase 97 38 - 126 U/L   Total Bilirubin 1.0 0.3 - 1.2 mg/dL   GFR, Estimated >09 >60 mL/min    Comment: (NOTE) Calculated using the CKD-EPI Creatinine Equation (2021)     Anion gap 5 5 - 15    Comment: Performed at [facility], [address] [place], [place] [address]  Ammonia     Status: None   Collection Time: 12/09/22  4:55 AM  Result Value Ref Range   Ammonia 29 9 - 35 umol/L    Comment: Performed at [facility], [address] [place], [place] [address]  CBC     Status: Abnormal   Collection Time: 12/09/22  4:55 AM  Result Value Ref Range   WBC 9.3 4.0 - 10.5 K/uL   RBC 3.59 (L) 4.22 - 5.81 MIL/uL   Hemoglobin 11.6 (L) 13.0 - 17.0 g/dL   HCT 47.8 (L) 29.5 - 62.1 %   MCV 93.9 80.0 - 100.0 fL   MCH 32.3 26.0 - 34.0 pg   MCHC 34.4 30.0 - 36.0 g/dL   RDW 30.8 65.7 - 84.6 %   Platelets 318 150 - 400 K/uL   nRBC 0.0 0.0 - 0.2 %    Comment: Performed at [facility], [address], [place], [place] [address]  Magnesium     Status: Abnormal   Collection Time: 12/09/22  4:55 AM  Result Value Ref Range   Magnesium 1.6 (L) 1.7 - 2.4 mg/dL    Comment: Performed at [facility], [address] [place], [place] [address]  Phosphorus     Status: None   Collection Time: 12/09/22  4:55 AM  Result Value Ref Range   Phosphorus 3.0 2.5 - 4.6 mg/dL    Comment: Performed at [facility], [address] [place], [place] [address]  Glucose, capillary     Status: Abnormal   Collection Time: 12/09/22  7:55 AM  Result Value Ref Range   Glucose-Capillary 102 (  H) 70 - 99 mg/dL    Comment: Glucose reference range applies only to samples taken after fasting for at least 8 hours.  Glucose, capillary     Status: Abnormal   Collection Time: 12/09/22 11:57 AM  Result Value Ref Range   Glucose-Capillary 125 (H) 70 - 99 mg/dL    Comment: Glucose reference range applies only to samples taken after fasting for at least 8 hours.  Glucose, capillary     Status: Abnormal   Collection Time: 12/09/22  4:01 PM  Result Value Ref Range   Glucose-Capillary 126 (H) 70 - 99 mg/dL    Comment: Glucose reference range applies  only to samples taken after fasting for at least 8 hours.  Magnesium     Status: None   Collection Time: 12/09/22  4:49 PM  Result Value Ref Range   Magnesium 1.7 1.7 - 2.4 mg/dL    Comment: Performed at [facility], [address] [place], [place] [address]  Phosphorus     Status: None   Collection Time: 12/09/22  4:49 PM  Result Value Ref Range   Phosphorus 3.8 2.5 - 4.6 mg/dL    Comment: Performed at [facility], [address] [place], [place] [address]  Glucose, capillary     Status: None   Collection Time: 12/09/22  8:23 PM  Result Value Ref Range   Glucose-Capillary 97 70 - 99 mg/dL    Comment: Glucose reference range applies only to samples taken after fasting for at least 8 hours.  Glucose, capillary     Status: Abnormal   Collection Time: 12/10/22 12:01 AM  Result Value Ref Range   Glucose-Capillary 112 (H) 70 - 99 mg/dL    Comment: Glucose reference range applies only to samples taken after fasting for at least 8 hours.  Magnesium     Status: None   Collection Time: 12/10/22  4:58 AM  Result Value Ref Range   Magnesium 1.7 1.7 - 2.4 mg/dL    Comment: Performed at [facility], [address] [place], [place] [address]  Phosphorus     Status: None   Collection Time: 12/10/22  4:58 AM  Result Value Ref Range   Phosphorus 3.0 2.5 - 4.6 mg/dL    Comment: Performed at [facility], [address] [place], [place] [address]  CBC     Status: Abnormal   Collection Time: 12/10/22  4:58 AM  Result Value Ref Range   WBC 10.5 4.0 - 10.5 K/uL   RBC 3.61 (L) 4.22 - 5.81 MIL/uL   Hemoglobin 11.5 (L) 13.0 - 17.0 g/dL   HCT 95.2 (L) 84.1 - 32.4 %   MCV 93.4 80.0 - 100.0 fL   MCH 31.9 26.0 - 34.0 pg   MCHC 34.1 30.0 - 36.0 g/dL   RDW 40.1 02.7 - 25.3 %   Platelets 313 150 - 400 K/uL   nRBC 0.0 0.0 - 0.2 %    Comment: Performed at [facility], [address] [place], [place] [address]  Basic metabolic panel     Status:  Abnormal   Collection Time: 12/10/22  4:58 AM  Result Value Ref Range   Sodium 131 (L) 135 - 145 mmol/L   Potassium 3.8 3.5 - 5.1 mmol/L   Chloride 102 98 - 111 mmol/L   CO2 26 22 - 32 mmol/L   Glucose, Bld 120 (H) 70 - 99 mg/dL    Comment: Glucose reference range applies only to samples  taken after fasting for at least 8 hours.   BUN 13 6 - 20 mg/dL   Creatinine, Ser 1.61 (L) 0.61 - 1.24 mg/dL   Calcium 7.6 (L) 8.9 - 10.3 mg/dL   GFR, Estimated >09 >60 mL/min    Comment: (NOTE) Calculated using the CKD-EPI Creatinine Equation (2021)    Anion gap 3 (L) 5 - 15    Comment: Performed at [facility], [address] [place], [place] [address]  Glucose, capillary     Status: Abnormal   Collection Time: 12/10/22  5:37 AM  Result Value Ref Range   Glucose-Capillary 114 (H) 70 - 99 mg/dL    Comment: Glucose reference range applies only to samples taken after fasting for at least 8 hours.  Glucose, capillary     Status: Abnormal   Collection Time: 12/10/22  8:48 AM  Result Value Ref Range   Glucose-Capillary 108 (H) 70 - 99 mg/dL    Comment: Glucose reference range applies only to samples taken after fasting for at least 8 hours.  Glucose, capillary     Status: Abnormal   Collection Time: 12/10/22 11:35 AM  Result Value Ref Range   Glucose-Capillary 107 (H) 70 - 99 mg/dL    Comment: Glucose reference range applies only to samples taken after fasting for at least 8 hours.  Glucose, capillary     Status: Abnormal   Collection Time: 12/10/22  5:32 PM  Result Value Ref Range   Glucose-Capillary 135 (H) 70 - 99 mg/dL    Comment: Glucose reference range applies only to samples taken after fasting for at least 8 hours.  Glucose, capillary     Status: Abnormal   Collection Time: 12/10/22  7:51 PM  Result Value Ref Range   Glucose-Capillary 142 (H) 70 - 99 mg/dL    Comment: Glucose reference range applies only to samples taken after fasting for at least 8 hours.  Glucose,  capillary     Status: Abnormal   Collection Time: 12/10/22 11:56 PM  Result Value Ref Range   Glucose-Capillary 104 (H) 70 - 99 mg/dL    Comment: Glucose reference range applies only to samples taken after fasting for at least 8 hours.  Glucose, capillary     Status: Abnormal   Collection Time: 12/11/22  5:04 AM  Result Value Ref Range   Glucose-Capillary 108 (H) 70 - 99 mg/dL    Comment: Glucose reference range applies only to samples taken after fasting for at least 8 hours.  Glucose, capillary     Status: Abnormal   Collection Time: 12/11/22  8:26 AM  Result Value Ref Range   Glucose-Capillary 129 (H) 70 - 99 mg/dL    Comment: Glucose reference range applies only to samples taken after fasting for at least 8 hours.  Glucose, capillary     Status: Abnormal   Collection Time: 12/11/22 11:45 AM  Result Value Ref Range   Glucose-Capillary 150 (H) 70 - 99 mg/dL    Comment: Glucose reference range applies only to samples taken after fasting for at least 8 hours.  Glucose, capillary     Status: Abnormal   Collection Time: 12/11/22  3:54 PM  Result Value Ref Range   Glucose-Capillary 114 (H) 70 - 99 mg/dL    Comment: Glucose reference range applies only to samples taken after fasting for at least 8 hours.  Glucose, capillary     Status: Abnormal   Collection Time: 12/11/22  8:54 PM  Result Value Ref Range  Glucose-Capillary 116 (H) 70 - 99 mg/dL    Comment: Glucose reference range applies only to samples taken after fasting for at least 8 hours.  Glucose, capillary     Status: Abnormal   Collection Time: 12/12/22 12:01 AM  Result Value Ref Range   Glucose-Capillary 135 (H) 70 - 99 mg/dL    Comment: Glucose reference range applies only to samples taken after fasting for at least 8 hours.  Glucose, capillary     Status: Abnormal   Collection Time: 12/12/22  4:07 AM  Result Value Ref Range   Glucose-Capillary 120 (H) 70 - 99 mg/dL    Comment: Glucose reference range applies only to  samples taken after fasting for at least 8 hours.  Comprehensive metabolic panel     Status: Abnormal   Collection Time: 12/12/22  6:19 AM  Result Value Ref Range   Sodium 130 (L) 135 - 145 mmol/L   Potassium 4.1 3.5 - 5.1 mmol/L   Chloride 99 98 - 111 mmol/L   CO2 25 22 - 32 mmol/L   Glucose, Bld 113 (H) 70 - 99 mg/dL    Comment: Glucose reference range applies only to samples taken after fasting for at least 8 hours.   BUN 13 6 - 20 mg/dL   Creatinine, Ser 9.60 (L) 0.61 - 1.24 mg/dL   Calcium 8.1 (L) 8.9 - 10.3 mg/dL   Total Protein 7.9 6.5 - 8.1 g/dL   Albumin 2.2 (L) 3.5 - 5.0 g/dL   AST 52 (H) 15 - 41 U/L   ALT 27 0 - 44 U/L   Alkaline Phosphatase 109 38 - 126 U/L   Total Bilirubin 0.6 0.3 - 1.2 mg/dL   GFR, Estimated >45 >40 mL/min    Comment: (NOTE) Calculated using the CKD-EPI Creatinine Equation (2021)    Anion gap 6 5 - 15    Comment: Performed at [facility], [address] [place], [place] [address]  Ammonia     Status: Abnormal   Collection Time: 12/12/22  6:19 AM  Result Value Ref Range   Ammonia 37 (H) 9 - 35 umol/L    Comment: Performed at [facility], [address] [place], [place] [address]  CBC     Status: Abnormal   Collection Time: 12/12/22  6:19 AM  Result Value Ref Range   WBC 10.2 4.0 - 10.5 K/uL   RBC 3.69 (L) 4.22 - 5.81 MIL/uL   Hemoglobin 11.8 (L) 13.0 - 17.0 g/dL   HCT 95.6 (L) 21.3 - 08.6 %   MCV 92.7 80.0 - 100.0 fL   MCH 32.0 26.0 - 34.0 pg   MCHC 34.5 30.0 - 36.0 g/dL   RDW 57.8 46.9 - 62.9 %   Platelets 299 150 - 400 K/uL   nRBC 0.0 0.0 - 0.2 %    Comment: Performed at [facility], [address] [place], [place] [address]  Magnesium     Status: None   Collection Time: 12/12/22  6:19 AM  Result Value Ref Range   Magnesium 1.8 1.7 - 2.4 mg/dL    Comment: Performed at [facility], [address] [place], [place] [address]  Glucose, capillary     Status: Abnormal   Collection Time:  12/12/22  8:43 AM  Result Value Ref Range   Glucose-Capillary 140 (H) 70 - 99 mg/dL    Comment: Glucose reference range applies only to samples taken after fasting for at least 8 hours.  Glucose, capillary  Status: Abnormal   Collection Time: 12/12/22 12:32 PM  Result Value Ref Range   Glucose-Capillary 130 (H) 70 - 99 mg/dL    Comment: Glucose reference range applies only to samples taken after fasting for at least 8 hours.  Glucose, capillary     Status: Abnormal   Collection Time: 12/12/22  4:00 PM  Result Value Ref Range   Glucose-Capillary 110 (H) 70 - 99 mg/dL    Comment: Glucose reference range applies only to samples taken after fasting for at least 8 hours.  Glucose, capillary     Status: Abnormal   Collection Time: 12/12/22  8:12 PM  Result Value Ref Range   Glucose-Capillary 134 (H) 70 - 99 mg/dL    Comment: Glucose reference range applies only to samples taken after fasting for at least 8 hours.  Glucose, capillary     Status: Abnormal   Collection Time: 12/13/22 12:28 AM  Result Value Ref Range   Glucose-Capillary 134 (H) 70 - 99 mg/dL    Comment: Glucose reference range applies only to samples taken after fasting for at least 8 hours.  Glucose, capillary     Status: Abnormal   Collection Time: 12/13/22  5:12 AM  Result Value Ref Range   Glucose-Capillary 119 (H) 70 - 99 mg/dL    Comment: Glucose reference range applies only to samples taken after fasting for at least 8 hours.  Glucose, capillary     Status: Abnormal   Collection Time: 12/13/22 11:46 AM  Result Value Ref Range   Glucose-Capillary 114 (H) 70 - 99 mg/dL    Comment: Glucose reference range applies only to samples taken after fasting for at least 8 hours.  Glucose, capillary     Status: Abnormal   Collection Time: 12/13/22  3:55 PM  Result Value Ref Range   Glucose-Capillary 160 (H) 70 - 99 mg/dL    Comment: Glucose reference range applies only to samples taken after fasting for at least 8 hours.   Glucose, capillary     Status: Abnormal   Collection Time: 12/13/22  8:31 PM  Result Value Ref Range   Glucose-Capillary 129 (H) 70 - 99 mg/dL    Comment: Glucose reference range applies only to samples taken after fasting for at least 8 hours.  Glucose, capillary     Status: Abnormal   Collection Time: 12/14/22 12:15 AM  Result Value Ref Range   Glucose-Capillary 127 (H) 70 - 99 mg/dL    Comment: Glucose reference range applies only to samples taken after fasting for at least 8 hours.  CBC with Differential/Platelet     Status: Abnormal   Collection Time: 12/14/22  5:54 AM  Result Value Ref Range   WBC 9.1 4.0 - 10.5 K/uL   RBC 3.65 (L) 4.22 - 5.81 MIL/uL   Hemoglobin 11.6 (L) 13.0 - 17.0 g/dL   HCT 08.6 (L) 57.8 - 46.9 %   MCV 91.8 80.0 - 100.0 fL   MCH 31.8 26.0 - 34.0 pg   MCHC 34.6 30.0 - 36.0 g/dL   RDW 62.9 52.8 - 41.3 %   Platelets 251 150 - 400 K/uL   nRBC 0.0 0.0 - 0.2 %   Neutrophils Relative % 68 %   Neutro Abs 6.2 1.7 - 7.7 K/uL   Lymphocytes Relative 19 %   Lymphs Abs 1.7 0.7 - 4.0 K/uL   Monocytes Relative 9 %   Monocytes Absolute 0.8 0.1 - 1.0 K/uL   Eosinophils Relative 3 %  Eosinophils Absolute 0.3 0.0 - 0.5 K/uL   Basophils Relative 1 %   Basophils Absolute 0.1 0.0 - 0.1 K/uL   Immature Granulocytes 0 %   Abs Immature Granulocytes 0.02 0.00 - 0.07 K/uL    Comment: Performed at [facility], [address], [place], [place] [address]  Basic metabolic panel     Status: Abnormal   Collection Time: 12/14/22  5:54 AM  Result Value Ref Range   Sodium 131 (L) 135 - 145 mmol/L   Potassium 4.2 3.5 - 5.1 mmol/L   Chloride 97 (L) 98 - 111 mmol/L   CO2 27 22 - 32 mmol/L   Glucose, Bld 124 (H) 70 - 99 mg/dL    Comment: Glucose reference range applies only to samples taken after fasting for at least 8 hours.   BUN 14 6 - 20 mg/dL   Creatinine, Ser 4.16 (L) 0.61 - 1.24 mg/dL   Calcium 8.1 (L) 8.9 - 10.3 mg/dL   GFR, Estimated >60 >63 mL/min     Comment: (NOTE) Calculated using the CKD-EPI Creatinine Equation (2021)    Anion gap 7 5 - 15    Comment: Performed at [facility], [address] [place], [place] [address]  Glucose, capillary     Status: Abnormal   Collection Time: 12/14/22  7:32 AM  Result Value Ref Range   Glucose-Capillary 146 (H) 70 - 99 mg/dL    Comment: Glucose reference range applies only to samples taken after fasting for at least 8 hours.  Glucose, capillary     Status: Abnormal   Collection Time: 12/14/22 11:43 AM  Result Value Ref Range   Glucose-Capillary 118 (H) 70 - 99 mg/dL    Comment: Glucose reference range applies only to samples taken after fasting for at least 8 hours.  Glucose, capillary     Status: Abnormal   Collection Time: 12/14/22  3:51 PM  Result Value Ref Range   Glucose-Capillary 127 (H) 70 - 99 mg/dL    Comment: Glucose reference range applies only to samples taken after fasting for at least 8 hours.  Glucose, capillary     Status: Abnormal   Collection Time: 12/14/22  4:03 PM  Result Value Ref Range   Glucose-Capillary 126 (H) 70 - 99 mg/dL    Comment: Glucose reference range applies only to samples taken after fasting for at least 8 hours.  Glucose, capillary     Status: Abnormal   Collection Time: 12/14/22  9:06 PM  Result Value Ref Range   Glucose-Capillary 141 (H) 70 - 99 mg/dL    Comment: Glucose reference range applies only to samples taken after fasting for at least 8 hours.  Glucose, capillary     Status: Abnormal   Collection Time: 12/14/22 11:50 PM  Result Value Ref Range   Glucose-Capillary 129 (H) 70 - 99 mg/dL    Comment: Glucose reference range applies only to samples taken after fasting for at least 8 hours.  CBC with Differential/Platelet     Status: Abnormal   Collection Time: 12/15/22  4:49 AM  Result Value Ref Range   WBC 7.2 4.0 - 10.5 K/uL   RBC 3.53 (L) 4.22 - 5.81 MIL/uL   Hemoglobin 11.5 (L) 13.0 - 17.0 g/dL   HCT 09.3 (L) 23.5 - 57.3 %    MCV 93.5 80.0 - 100.0 fL   MCH 32.6 26.0 - 34.0 pg   MCHC 34.8 30.0 - 36.0 g/dL   RDW 22.0 25.4 - 27.0 %  Platelets 235 150 - 400 K/uL   nRBC 0.0 0.0 - 0.2 %   Neutrophils Relative % 60 %   Neutro Abs 4.3 1.7 - 7.7 K/uL   Lymphocytes Relative 23 %   Lymphs Abs 1.7 0.7 - 4.0 K/uL   Monocytes Relative 11 %   Monocytes Absolute 0.8 0.1 - 1.0 K/uL   Eosinophils Relative 5 %   Eosinophils Absolute 0.4 0.0 - 0.5 K/uL   Basophils Relative 1 %   Basophils Absolute 0.1 0.0 - 0.1 K/uL   Immature Granulocytes 0 %   Abs Immature Granulocytes 0.02 0.00 - 0.07 K/uL    Comment: Performed at [facility], [address] [place], [place] [address]  Basic metabolic panel     Status: Abnormal   Collection Time: 12/15/22  4:49 AM  Result Value Ref Range   Sodium 134 (L) 135 - 145 mmol/L   Potassium 3.9 3.5 - 5.1 mmol/L   Chloride 99 98 - 111 mmol/L   CO2 28 22 - 32 mmol/L   Glucose, Bld 126 (H) 70 - 99 mg/dL    Comment: Glucose reference range applies only to samples taken after fasting for at least 8 hours.   BUN 15 6 - 20 mg/dL   Creatinine, Ser 1.69 (L) 0.61 - 1.24 mg/dL   Calcium 8.3 (L) 8.9 - 10.3 mg/dL   GFR, Estimated >67 >89 mL/min    Comment: (NOTE) Calculated using the CKD-EPI Creatinine Equation (2021)    Anion gap 7 5 - 15    Comment: Performed at [facility], [address] [place], [place] [address]  Glucose, capillary     Status: Abnormal   Collection Time: 12/15/22  5:17 AM  Result Value Ref Range   Glucose-Capillary 129 (H) 70 - 99 mg/dL    Comment: Glucose reference range applies only to samples taken after fasting for at least 8 hours.  Glucose, capillary     Status: None   Collection Time: 12/15/22  7:24 AM  Result Value Ref Range   Glucose-Capillary 96 70 - 99 mg/dL    Comment: Glucose reference range applies only to samples taken after fasting for at least 8 hours.  Glucose, capillary     Status: Abnormal   Collection Time: 12/15/22 12:02 PM   Result Value Ref Range   Glucose-Capillary 129 (H) 70 - 99 mg/dL    Comment: Glucose reference range applies only to samples taken after fasting for at least 8 hours.  Glucose, capillary     Status: Abnormal   Collection Time: 12/15/22  4:53 PM  Result Value Ref Range   Glucose-Capillary 135 (H) 70 - 99 mg/dL    Comment: Glucose reference range applies only to samples taken after fasting for at least 8 hours.  Glucose, capillary     Status: Abnormal   Collection Time: 12/15/22  8:03 PM  Result Value Ref Range   Glucose-Capillary 160 (H) 70 - 99 mg/dL    Comment: Glucose reference range applies only to samples taken after fasting for at least 8 hours.  Glucose, capillary     Status: Abnormal   Collection Time: 12/16/22 12:03 AM  Result Value Ref Range   Glucose-Capillary 132 (H) 70 - 99 mg/dL    Comment: Glucose reference range applies only to samples taken after fasting for at least 8 hours.  Glucose, capillary     Status: None   Collection Time: 12/16/22  4:17 AM  Result Value Ref Range  Glucose-Capillary 77 70 - 99 mg/dL    Comment: Glucose reference range applies only to samples taken after fasting for at least 8 hours.  CBC with Differential/Platelet     Status: Abnormal   Collection Time: 12/16/22  4:35 AM  Result Value Ref Range   WBC 10.0 4.0 - 10.5 K/uL   RBC 3.65 (L) 4.22 - 5.81 MIL/uL   Hemoglobin 11.7 (L) 13.0 - 17.0 g/dL   HCT 60.4 (L) 54.0 - 98.1 %   MCV 92.6 80.0 - 100.0 fL   MCH 32.1 26.0 - 34.0 pg   MCHC 34.6 30.0 - 36.0 g/dL   RDW 19.1 47.8 - 29.5 %   Platelets 249 150 - 400 K/uL   nRBC 0.0 0.0 - 0.2 %   Neutrophils Relative % 64 %   Neutro Abs 6.4 1.7 - 7.7 K/uL   Lymphocytes Relative 23 %   Lymphs Abs 2.3 0.7 - 4.0 K/uL   Monocytes Relative 10 %   Monocytes Absolute 1.0 0.1 - 1.0 K/uL   Eosinophils Relative 2 %   Eosinophils Absolute 0.2 0.0 - 0.5 K/uL   Basophils Relative 1 %   Basophils Absolute 0.1 0.0 - 0.1 K/uL   Immature Granulocytes 0 %    Abs Immature Granulocytes 0.03 0.00 - 0.07 K/uL    Comment: Performed at [facility], [address], [place], [place] [address]  Basic metabolic panel     Status: Abnormal   Collection Time: 12/16/22  4:35 AM  Result Value Ref Range   Sodium 134 (L) 135 - 145 mmol/L   Potassium 3.7 3.5 - 5.1 mmol/L   Chloride 103 98 - 111 mmol/L   CO2 25 22 - 32 mmol/L   Glucose, Bld 80 70 - 99 mg/dL    Comment: Glucose reference range applies only to samples taken after fasting for at least 8 hours.   BUN 18 6 - 20 mg/dL   Creatinine, Ser 8.65 (L) 0.61 - 1.24 mg/dL   Calcium 8.4 (L) 8.9 - 10.3 mg/dL   GFR, Estimated >78 >46 mL/min    Comment: (NOTE) Calculated using the CKD-EPI Creatinine Equation (2021)    Anion gap 6 5 - 15    Comment: Performed at [facility], [address] [place], [place] [address]  Glucose, capillary     Status: Abnormal   Collection Time: 12/16/22  7:48 AM  Result Value Ref Range   Glucose-Capillary 156 (H) 70 - 99 mg/dL    Comment: Glucose reference range applies only to samples taken after fasting for at least 8 hours.  Glucose, capillary     Status: Abnormal   Collection Time: 12/16/22 11:36 AM  Result Value Ref Range   Glucose-Capillary 109 (H) 70 - 99 mg/dL    Comment: Glucose reference range applies only to samples taken after fasting for at least 8 hours.  Glucose, capillary     Status: Abnormal   Collection Time: 12/16/22  4:43 PM  Result Value Ref Range   Glucose-Capillary 118 (H) 70 - 99 mg/dL    Comment: Glucose reference range applies only to samples taken after fasting for at least 8 hours.  Glucose, capillary     Status: None   Collection Time: 12/16/22  9:02 PM  Result Value Ref Range   Glucose-Capillary 97 70 - 99 mg/dL    Comment: Glucose reference range applies only to samples taken after fasting for at least 8 hours.  Glucose, capillary  Status: Abnormal   Collection Time: 12/16/22 11:43 PM  Result Value Ref Range    Glucose-Capillary 112 (H) 70 - 99 mg/dL    Comment: Glucose reference range applies only to samples taken after fasting for at least 8 hours.  Glucose, capillary     Status: None   Collection Time: 12/17/22  3:10 AM  Result Value Ref Range   Glucose-Capillary 99 70 - 99 mg/dL    Comment: Glucose reference range applies only to samples taken after fasting for at least 8 hours.  Basic metabolic panel     Status: Abnormal   Collection Time: 12/17/22  4:26 AM  Result Value Ref Range   Sodium 135 135 - 145 mmol/L   Potassium 3.9 3.5 - 5.1 mmol/L   Chloride 104 98 - 111 mmol/L   CO2 23 22 - 32 mmol/L   Glucose, Bld 107 (H) 70 - 99 mg/dL    Comment: Glucose reference range applies only to samples taken after fasting for at least 8 hours.   BUN 18 6 - 20 mg/dL   Creatinine, Ser 2.84 0.61 - 1.24 mg/dL   Calcium 8.7 (L) 8.9 - 10.3 mg/dL   GFR, Estimated >13 >24 mL/min    Comment: (NOTE) Calculated using the CKD-EPI Creatinine Equation (2021)    Anion gap 8 5 - 15    Comment: Performed at [facility], [address] [place], [place] [address]  Glucose, capillary     Status: Abnormal   Collection Time: 12/17/22  7:13 AM  Result Value Ref Range   Glucose-Capillary 107 (H) 70 - 99 mg/dL    Comment: Glucose reference range applies only to samples taken after fasting for at least 8 hours.  Glucose, capillary     Status: Abnormal   Collection Time: 12/17/22 11:53 AM  Result Value Ref Range   Glucose-Capillary 103 (H) 70 - 99 mg/dL    Comment: Glucose reference range applies only to samples taken after fasting for at least 8 hours.  Glucose, capillary     Status: Abnormal   Collection Time: 12/17/22  3:55 PM  Result Value Ref Range   Glucose-Capillary 132 (H) 70 - 99 mg/dL    Comment: Glucose reference range applies only to samples taken after fasting for at least 8 hours.  Glucose, capillary     Status: None   Collection Time: 12/17/22  8:05 PM  Result Value Ref Range    Glucose-Capillary 99 70 - 99 mg/dL    Comment: Glucose reference range applies only to samples taken after fasting for at least 8 hours.  Glucose, capillary     Status: Abnormal   Collection Time: 12/17/22 11:57 PM  Result Value Ref Range   Glucose-Capillary 151 (H) 70 - 99 mg/dL    Comment: Glucose reference range applies only to samples taken after fasting for at least 8 hours.  Glucose, capillary     Status: None   Collection Time: 12/18/22  4:04 AM  Result Value Ref Range   Glucose-Capillary 90 70 - 99 mg/dL    Comment: Glucose reference range applies only to samples taken after fasting for at least 8 hours.  CBC with Differential/Platelet     Status: Abnormal   Collection Time: 12/18/22  4:21 AM  Result Value Ref Range   WBC 10.9 (H) 4.0 - 10.5 K/uL   RBC 3.76 (L) 4.22 - 5.81 MIL/uL   Hemoglobin 11.9 (L) 13.0 - 17.0 g/dL   HCT 72.5 (L) 36.6 -  52.0 %   MCV 92.0 80.0 - 100.0 fL   MCH 31.6 26.0 - 34.0 pg   MCHC 34.4 30.0 - 36.0 g/dL   RDW 60.4 54.0 - 98.1 %   Platelets 235 150 - 400 K/uL   nRBC 0.0 0.0 - 0.2 %   Neutrophils Relative % 57 %   Neutro Abs 6.2 1.7 - 7.7 K/uL   Lymphocytes Relative 28 %   Lymphs Abs 3.1 0.7 - 4.0 K/uL   Monocytes Relative 8 %   Monocytes Absolute 0.9 0.1 - 1.0 K/uL   Eosinophils Relative 6 %   Eosinophils Absolute 0.6 (H) 0.0 - 0.5 K/uL   Basophils Relative 1 %   Basophils Absolute 0.1 0.0 - 0.1 K/uL   Immature Granulocytes 0 %   Abs Immature Granulocytes 0.04 0.00 - 0.07 K/uL    Comment: Performed at [facility], [address], [place], [place] [address]  Basic metabolic panel     Status: Abnormal   Collection Time: 12/18/22  4:21 AM  Result Value Ref Range   Sodium 132 (L) 135 - 145 mmol/L   Potassium 4.0 3.5 - 5.1 mmol/L   Chloride 101 98 - 111 mmol/L   CO2 25 22 - 32 mmol/L   Glucose, Bld 93 70 - 99 mg/dL    Comment: Glucose reference range applies only to samples taken after fasting for at least 8 hours.   BUN 19 6 - 20  mg/dL   Creatinine, Ser 8.29 (L) 0.61 - 1.24 mg/dL   Calcium 8.3 (L) 8.9 - 10.3 mg/dL   GFR, Estimated >56 >21 mL/min    Comment: (NOTE) Calculated using the CKD-EPI Creatinine Equation (2021)    Anion gap 6 5 - 15    Comment: Performed at [facility] , [address] [place], [place] [address]  Glucose, capillary     Status: Abnormal   Collection Time: 12/18/22  7:55 AM  Result Value Ref Range   Glucose-Capillary 152 (H) 70 - 99 mg/dL    Comment: Glucose reference range applies only to samples taken after fasting for at least 8 hours.  Glucose, capillary     Status: Abnormal   Collection Time: 12/18/22 11:52 AM  Result Value Ref Range   Glucose-Capillary 118 (H) 70 - 99 mg/dL    Comment: Glucose reference range applies only to samples taken after fasting for at least 8 hours.  Glucose, capillary     Status: Abnormal   Collection Time: 12/18/22  5:24 PM  Result Value Ref Range   Glucose-Capillary 101 (H) 70 - 99 mg/dL    Comment: Glucose reference range applies only to samples taken after fasting for at least 8 hours.  Glucose, capillary     Status: None   Collection Time: 12/18/22  8:21 PM  Result Value Ref Range   Glucose-Capillary 83 70 - 99 mg/dL    Comment: Glucose reference range applies only to samples taken after fasting for at least 8 hours.  Glucose, capillary     Status: Abnormal   Collection Time: 12/18/22 11:54 PM  Result Value Ref Range   Glucose-Capillary 111 (H) 70 - 99 mg/dL    Comment: Glucose reference range applies only to samples taken after fasting for at least 8 hours.  Glucose, capillary     Status: None   Collection Time: 12/19/22  4:09 AM  Result Value Ref Range   Glucose-Capillary 84 70 - 99 mg/dL    Comment: Glucose reference range applies  only to samples taken after fasting for at least 8 hours.  CBC with Differential/Platelet     Status: Abnormal   Collection Time: 12/19/22  5:06 AM  Result Value Ref Range   WBC 8.7 4.0 - 10.5 K/uL    RBC 3.92 (L) 4.22 - 5.81 MIL/uL   Hemoglobin 12.4 (L) 13.0 - 17.0 g/dL   HCT 82.9 (L) 56.2 - 13.0 %   MCV 92.3 80.0 - 100.0 fL   MCH 31.6 26.0 - 34.0 pg   MCHC 34.3 30.0 - 36.0 g/dL   RDW 86.5 78.4 - 69.6 %   Platelets 235 150 - 400 K/uL   nRBC 0.0 0.0 - 0.2 %   Neutrophils Relative % 54 %   Neutro Abs 4.7 1.7 - 7.7 K/uL   Lymphocytes Relative 29 %   Lymphs Abs 2.5 0.7 - 4.0 K/uL   Monocytes Relative 9 %   Monocytes Absolute 0.8 0.1 - 1.0 K/uL   Eosinophils Relative 7 %   Eosinophils Absolute 0.6 (H) 0.0 - 0.5 K/uL   Basophils Relative 1 %   Basophils Absolute 0.1 0.0 - 0.1 K/uL   Immature Granulocytes 0 %   Abs Immature Granulocytes 0.02 0.00 - 0.07 K/uL    Comment: Performed at [facility], [address], [place], [place] [address]  Basic metabolic panel     Status: Abnormal   Collection Time: 12/19/22  5:06 AM  Result Value Ref Range   Sodium 133 (L) 135 - 145 mmol/L   Potassium 4.0 3.5 - 5.1 mmol/L   Chloride 98 98 - 111 mmol/L   CO2 27 22 - 32 mmol/L   Glucose, Bld 94 70 - 99 mg/dL    Comment: Glucose reference range applies only to samples taken after fasting for at least 8 hours.   BUN 12 6 - 20 mg/dL   Creatinine, Ser 4.13 (L) 0.61 - 1.24 mg/dL   Calcium 8.4 (L) 8.9 - 10.3 mg/dL   GFR, Estimated >24 >40 mL/min    Comment: (NOTE) Calculated using the CKD-EPI Creatinine Equation (2021)    Anion gap 8 5 - 15    Comment: Performed at [facility], [address] [place], [place] [address]  Glucose, capillary     Status: None   Collection Time: 12/19/22  8:44 AM  Result Value Ref Range   Glucose-Capillary 91 70 - 99 mg/dL    Comment: Glucose reference range applies only to samples taken after fasting for at least 8 hours.  Glucose, capillary     Status: Abnormal   Collection Time: 12/19/22 11:43 AM  Result Value Ref Range   Glucose-Capillary 131 (H) 70 - 99 mg/dL    Comment: Glucose reference range applies only to samples taken after fasting  for at least 8 hours.  Glucose, capillary     Status: Abnormal   Collection Time: 12/19/22  4:20 PM  Result Value Ref Range   Glucose-Capillary 146 (H) 70 - 99 mg/dL    Comment: Glucose reference range applies only to samples taken after fasting for at least 8 hours.  Lipase, blood     Status: Abnormal   Collection Time: 12/26/22  2:38 PM  Result Value Ref Range   Lipase 556 (H) 11 - 51 U/L    Comment: RESULTS CONFIRMED BY MANUAL DILUTION MU Performed at [facility], [address], [place], [place] [address]   Comprehensive metabolic panel     Status: Abnormal   Collection Time: 12/26/22  2:38 PM  Result Value Ref Range   Sodium 134 (L) 135 - 145 mmol/L   Potassium 3.7 3.5 - 5.1 mmol/L   Chloride 101 98 - 111 mmol/L   CO2 25 22 - 32 mmol/L   Glucose, Bld 104 (H) 70 - 99 mg/dL    Comment: Glucose reference range applies only to samples taken after fasting for at least 8 hours.   BUN 12 6 - 20 mg/dL   Creatinine, Ser 0.98 (L) 0.61 - 1.24 mg/dL   Calcium 8.5 (L) 8.9 - 10.3 mg/dL   Total Protein 8.6 (H) 6.5 - 8.1 g/dL   Albumin 2.7 (L) 3.5 - 5.0 g/dL   AST 40 15 - 41 U/L   ALT 23 0 - 44 U/L   Alkaline Phosphatase 111 38 - 126 U/L   Total Bilirubin 0.4 0.3 - 1.2 mg/dL   GFR, Estimated >11 >91 mL/min    Comment: (NOTE) Calculated using the CKD-EPI Creatinine Equation (2021)    Anion gap 8 5 - 15    Comment: Performed at [facility], [address] [place], [place] [address]  CBC     Status: None   Collection Time: 12/26/22  2:38 PM  Result Value Ref Range   WBC 9.8 4.0 - 10.5 K/uL   RBC 4.27 4.22 - 5.81 MIL/uL   Hemoglobin 13.4 13.0 - 17.0 g/dL   HCT 56.2 13.0 - 86.5 %   MCV 93.0 80.0 - 100.0 fL   MCH 31.4 26.0 - 34.0 pg   MCHC 33.8 30.0 - 36.0 g/dL   RDW 78.4 69.6 - 29.5 %   Platelets 221 150 - 400 K/uL   nRBC 0.0 0.0 - 0.2 %    Comment: Performed at [facility], [address] [place], [place] [address]  Lipase, blood     Status:  Abnormal   Collection Time: 01/02/23  8:29 PM  Result Value Ref Range   Lipase 834 (H) 11 - 51 U/L    Comment: RESULTS CONFIRMED BY MANUAL DILUTION mu Performed at [facility], [address], [place], [place] [address]   Comprehensive metabolic panel     Status: Abnormal   Collection Time: 01/02/23  8:29 PM  Result Value Ref Range   Sodium 136 135 - 145 mmol/L   Potassium 4.0 3.5 - 5.1 mmol/L   Chloride 101 98 - 111 mmol/L   CO2 27 22 - 32 mmol/L   Glucose, Bld 120 (H) 70 - 99 mg/dL    Comment: Glucose reference range applies only to samples taken after fasting for at least 8 hours.   BUN 9 6 - 20 mg/dL   Creatinine, Ser 0.27 0.61 - 1.24 mg/dL   Calcium 8.7 (L) 8.9 - 10.3 mg/dL   Total Protein 8.9 (H) 6.5 - 8.1 g/dL   Albumin 2.9 (L) 3.5 - 5.0 g/dL   AST 39 15 - 41 U/L   ALT 23 0 - 44 U/L   Alkaline Phosphatase 109 38 - 126 U/L   Total Bilirubin 0.6 0.3 - 1.2 mg/dL   GFR, Estimated >25 >36 mL/min    Comment: (NOTE) Calculated using the CKD-EPI Creatinine Equation (2021)    Anion gap 8 5 - 15    Comment: Performed at [facility], [address] [place], [place] [address]  CBC     Status: None   Collection Time: 01/02/23  8:29 PM  Result Value Ref Range   WBC 8.5 4.0 - 10.5  K/uL   RBC 4.45 4.22 - 5.81 MIL/uL   Hemoglobin 13.5 13.0 - 17.0 g/dL   HCT 64.4 03.4 - 74.2 %   MCV 91.5 80.0 - 100.0 fL   MCH 30.3 26.0 - 34.0 pg   MCHC 33.2 30.0 - 36.0 g/dL   RDW 59.5 63.8 - 75.6 %   Platelets 254 150 - 400 K/uL   nRBC 0.0 0.0 - 0.2 %    Comment: Performed at [facility], [address] [place], [place] [address]  Lipase, blood     Status: Abnormal   Collection Time: 01/09/23  8:11 AM  Result Value Ref Range   Lipase 661 (H) 11 - 51 U/L    Comment: RESULT CONFIRMED BY MANUAL DILUTION  AB Performed at [facility], [address] [place], [place] [address]   Comprehensive metabolic panel     Status: Abnormal   Collection Time:  01/09/23  8:11 AM  Result Value Ref Range   Sodium 137 135 - 145 mmol/L   Potassium 3.6 3.5 - 5.1 mmol/L   Chloride 105 98 - 111 mmol/L   CO2 24 22 - 32 mmol/L   Glucose, Bld 120 (H) 70 - 99 mg/dL    Comment: Glucose reference range applies only to samples taken after fasting for at least 8 hours.   BUN 11 6 - 20 mg/dL   Creatinine, Ser 1.66 (L) 0.61 - 1.24 mg/dL   Calcium 8.5 (L) 8.9 - 10.3 mg/dL   Total Protein 8.6 (H) 6.5 - 8.1 g/dL   Albumin 2.8 (L) 3.5 - 5.0 g/dL   AST 29 15 - 41 U/L   ALT 14 0 - 44 U/L   Alkaline Phosphatase 91 38 - 126 U/L   Total Bilirubin 0.8 0.3 - 1.2 mg/dL   GFR, Estimated >06 >30 mL/min    Comment: (NOTE) Calculated using the CKD-EPI Creatinine Equation (2021)    Anion gap 8 5 - 15    Comment: Performed at [facility], [address] [place], [place] [address]  CBC     Status: Abnormal   Collection Time: 01/09/23  8:11 AM  Result Value Ref Range   WBC 8.0 4.0 - 10.5 K/uL   RBC 4.00 (L) 4.22 - 5.81 MIL/uL   Hemoglobin 12.2 (L) 13.0 - 17.0 g/dL   HCT 93.2 (L) 35.5 - 73.2 %   MCV 90.5 80.0 - 100.0 fL   MCH 30.5 26.0 - 34.0 pg   MCHC 33.7 30.0 - 36.0 g/dL   RDW 20.2 54.2 - 70.6 %   Platelets 258 150 - 400 K/uL   nRBC 0.0 0.0 - 0.2 %    Comment: Performed at [facility], [address] [place], [place] [address]  Magnesium     Status: Abnormal   Collection Time: 01/09/23  8:11 AM  Result Value Ref Range   Magnesium 1.6 (L) 1.7 - 2.4 mg/dL    Comment: Performed at [facility], [address] [place], [place] [address]  Phosphorus     Status: None   Collection Time: 01/09/23  8:11 AM  Result Value Ref Range   Phosphorus 4.4 2.5 - 4.6 mg/dL    Comment: Performed at [facility], [address] [place], [place] [address]  Triglycerides     Status: None   Collection Time: 01/09/23  8:11 AM  Result Value Ref Range   Triglycerides 50 <150 mg/dL    Comment: Performed at [facility],  [address], [place], [place] [address]  Ammonia     Status: None   Collection Time: 01/09/23  2:18 PM  Result Value Ref Range   Ammonia 17 9 - 35 umol/L    Comment: Performed at [facility], [address] [place], [place] [address]  Urinalysis, Routine w reflex microscopic -Urine, Clean Catch     Status: Abnormal   Collection Time: 01/09/23  6:29 PM  Result Value Ref Range   Color, Urine YELLOW (A) YELLOW   APPearance CLEAR (A) CLEAR   Specific Gravity, Urine 1.036 (H) 1.005 - 1.030   pH 5.0 5.0 - 8.0   Glucose, UA NEGATIVE NEGATIVE mg/dL   Hgb urine dipstick NEGATIVE NEGATIVE   Bilirubin Urine NEGATIVE NEGATIVE   Ketones, ur NEGATIVE NEGATIVE mg/dL   Protein, ur NEGATIVE NEGATIVE mg/dL   Nitrite NEGATIVE NEGATIVE   Leukocytes,Ua NEGATIVE NEGATIVE    Comment: Performed at [facility], [address] [place], [place] [address]  C Difficile Quick Screen w PCR reflex     Status: None   Collection Time: 01/09/23  6:29 PM   Specimen: Urine, Clean Catch; Stool  Result Value Ref Range   C Diff antigen NEGATIVE NEGATIVE   C Diff toxin NEGATIVE NEGATIVE   C Diff interpretation No C. difficile detected.     Comment: Performed at [facility], [address] [place], [place] [address]  Lipase, blood     Status: Abnormal   Collection Time: 01/10/23  7:05 AM  Result Value Ref Range   Lipase 280 (H) 11 - 51 U/L    Comment: Performed at [facility], [address] [place], [place] [address]  Basic metabolic panel     Status: Abnormal   Collection Time: 01/10/23  7:05 AM  Result Value Ref Range   Sodium 138 135 - 145 mmol/L   Potassium 3.4 (L) 3.5 - 5.1 mmol/L   Chloride 109 98 - 111 mmol/L   CO2 21 (L) 22 - 32 mmol/L   Glucose, Bld 83 70 - 99 mg/dL    Comment: Glucose reference range applies only to samples taken after fasting for at least 8 hours.   BUN 8 6 - 20 mg/dL   Creatinine, Ser 4.43 (L) 0.61 - 1.24 mg/dL   Calcium 7.9 (L) 8.9 -  10.3 mg/dL   GFR, Estimated >15 >40 mL/min    Comment: (NOTE) Calculated using the CKD-EPI Creatinine Equation (2021)    Anion gap 8 5 - 15    Comment: Performed at [facility], [address] [place], [place] [address]  CBC     Status: Abnormal   Collection Time: 01/10/23  7:05 AM  Result Value Ref Range   WBC 6.5 4.0 - 10.5 K/uL   RBC 3.57 (L) 4.22 - 5.81 MIL/uL   Hemoglobin 10.9 (L) 13.0 - 17.0 g/dL   HCT 19.5 (L) 09.3 - 26.7 %   MCV 91.3 80.0 - 100.0 fL   MCH 30.5 26.0 - 34.0 pg   MCHC 33.4 30.0 - 36.0 g/dL   RDW 12.4 58.0 - 99.8 %   Platelets 215 150 - 400 K/uL   nRBC 0.0 0.0 - 0.2 %    Comment: Performed at [facility], [address] [place], [place] [address]  Glucose, capillary     Status: None   Collection Time: 01/10/23  8:01 AM  Result Value Ref Range   Glucose-Capillary 76 70 - 99 mg/dL  Comment: Glucose reference range applies only to samples taken after fasting for at least 8 hours.  Basic metabolic panel     Status: Abnormal   Collection Time: 01/11/23  5:27 AM  Result Value Ref Range   Sodium 137 135 - 145 mmol/L   Potassium 3.7 3.5 - 5.1 mmol/L   Chloride 107 98 - 111 mmol/L   CO2 23 22 - 32 mmol/L   Glucose, Bld 92 70 - 99 mg/dL    Comment: Glucose reference range applies only to samples taken after fasting for at least 8 hours.   BUN 6 6 - 20 mg/dL   Creatinine, Ser 6.57 (L) 0.61 - 1.24 mg/dL   Calcium 7.9 (L) 8.9 - 10.3 mg/dL   GFR, Estimated >84 >69 mL/min    Comment: (NOTE) Calculated using the CKD-EPI Creatinine Equation (2021)    Anion gap 7 5 - 15    Comment: Performed at [facility], [address] [place], [place] [address]  Glucose, capillary     Status: None   Collection Time: 01/11/23  9:03 AM  Result Value Ref Range   Glucose-Capillary 85 70 - 99 mg/dL    Comment: Glucose reference range applies only to samples taken after fasting for at least 8 hours.       Assessment & Plan:   Problem List  Items Addressed This Visit       Active Problems   GERD (gastroesophageal reflux disease) - Primary    Famotidine has not been effective.  Will switch pt to pantoprazole.  Reassess at follow up.       Relevant Medications   pantoprazole (PROTONIX) 40 MG tablet   Psychophysiological insomnia    Starting pt. On Trazodone.  Will have him follow up in 2 week so we can ensure this is working.       Return in about 2 weeks (around 03/02/2023).   Total time spent: 20 minutes  Miki Kins, FNP  02/16/2023   This document may have been prepared by [facility] Voice Recognition software and as such may include unintentional dictation errors.

## 2023-03-15 ENCOUNTER — Other Ambulatory Visit: Payer: Self-pay

## 2023-03-15 MED ORDER — PANTOPRAZOLE SODIUM 40 MG PO TBEC: 40.0000 mg | DELAYED_RELEASE_TABLET | Freq: Every day | ORAL | 0 refills | Status: DC

## 2023-04-06 ENCOUNTER — Ambulatory Visit: Payer: Medicaid Other | Admitting: Internal Medicine

## 2023-04-09 ENCOUNTER — Ambulatory Visit: Payer: Medicaid Other | Admitting: Internal Medicine

## 2023-06-01 ENCOUNTER — Ambulatory Visit: Payer: Medicaid Other | Admitting: Internal Medicine

## 2023-06-12 ENCOUNTER — Encounter: Payer: Self-pay | Admitting: Internal Medicine

## 2023-06-12 ENCOUNTER — Ambulatory Visit: Payer: Medicaid Other | Admitting: Internal Medicine

## 2023-06-12 VITALS — BP 102/80 | HR 89 | Ht 74.0 in | Wt 168.2 lb

## 2023-06-12 DIAGNOSIS — E43 Unspecified severe protein-calorie malnutrition: Secondary | ICD-10-CM | POA: Diagnosis not present

## 2023-06-12 DIAGNOSIS — K219 Gastro-esophageal reflux disease without esophagitis: Secondary | ICD-10-CM | POA: Diagnosis not present

## 2023-06-12 DIAGNOSIS — Z0130 Encounter for examination of blood pressure without abnormal findings: Secondary | ICD-10-CM

## 2023-06-12 DIAGNOSIS — K860 Alcohol-induced chronic pancreatitis: Secondary | ICD-10-CM

## 2023-06-12 DIAGNOSIS — F1011 Alcohol abuse, in remission: Secondary | ICD-10-CM

## 2023-06-12 DIAGNOSIS — F17200 Nicotine dependence, unspecified, uncomplicated: Secondary | ICD-10-CM

## 2023-06-12 DIAGNOSIS — F1010 Alcohol abuse, uncomplicated: Secondary | ICD-10-CM

## 2023-06-12 MED ORDER — PANTOPRAZOLE SODIUM 40 MG PO TBEC: 40.0000 mg | DELAYED_RELEASE_TABLET | Freq: Every day | ORAL | 3 refills | Status: DC

## 2023-06-12 MED ORDER — FOLIC ACID 1 MG PO TABS: 1.0000 mg | ORAL_TABLET | Freq: Every day | ORAL | 3 refills | Status: DC

## 2023-06-12 MED ORDER — PANCRELIPASE (LIP-PROT-AMYL) 24000-76000 UNITS PO CPEP: 24000.0000 [IU] | ORAL_CAPSULE | Freq: Three times a day (TID) | ORAL | 1 refills | Status: DC

## 2023-06-12 MED ORDER — VITAMIN B-1 100 MG PO TABS: 100.0000 mg | ORAL_TABLET | Freq: Every day | ORAL | 3 refills | Status: DC

## 2023-06-12 NOTE — Progress Notes (Signed)
Established Patient Office Visit  Subjective:  Patient ID: Luke Rogers., male    DOB: [birth year]  Age: 62 y.o. MRN: [id number]  Chief Complaint  Patient presents with   Follow-up    3 months    Patient comes in for his follow-up today.  He is in good spirits and is eating well.  He has gained weight and is also back at his job.  He is no longer drinking alcohol.  Needs to continue taking his folic acid and thiamine.  Continues to smoke cigarettes. No nausea vomiting, heartburn or abdominal pain. Declines flu vaccine. Labs today.    No other concerns at this time.   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   Hyperbilirubinemia 12/18/2021   Hypomagnesemia 12/22/2021   Hyponatremia 12/18/2021   Mass of soft tissue of left lower extremity 10/03/2022   Metabolic acidosis 12/18/2021   Pancreatitis    PEG (percutaneous endoscopic gastrostomy) adjustment/replacement/removal (HCC) 01/16/2023   Status post insertion of percutaneous endoscopic gastrostomy (PEG) tube (HCC) 01/09/2023   Transaminitis 08/24/2014   Tremor 08/24/2014   Visual hallucination    Visual hallucinations 12/18/2021   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    Past Surgical History:  Procedure Laterality Date   FOOT SURGERY Bilateral    As infant   INSERTION OF MESH Bilateral 09/25/2022   Procedure: INSERTION OF MESH;  Surgeon: Campbell Lerner, MD;  Location: [facility] ORS;  Service: General;  Laterality: Bilateral;   PEG PLACEMENT N/A 12/08/2022   Procedure: PERCUTANEOUS ENDOSCOPIC GASTROSTOMY (PEG) PLACEMENT;  Surgeon: Toledo,  Boykin Nearing, MD;  Location: [facility] END[facility];  Service: Gastroenterology;  Laterality: N/A;   WRIST SURGERY Right     Social History   Socioeconomic History   Marital status: Single    Spouse name: Not on file   Number of children: Not on file   Years of education: Not on file   Highest education level: Not on file  Occupational History   Not on file  Tobacco Use   Smoking status: Every Day    Current packs/day: 0.50    Types: Cigarettes    Passive exposure: Past   Smokeless tobacco: Never  Vaping Use   Vaping status: Never Used  Substance and Sexual Activity   Alcohol use: Not Currently    Alcohol/week: 63.0 standard drinks of alcohol    Types: 63 Cans of beer per week   Drug use: Yes    Types: Amphetamines   Sexual activity: Yes  Other Topics Concern   Not on file  Social History Narrative   Not on file   Social Determinants of Health   Financial Resource Strain: Not on file  Food Insecurity: No Food Insecurity (01/09/2023)   Hunger Vital Sign    Worried About Running Out of Food in the Last Year: Never true    Ran Out of Food in the Last Year: Never true  Transportation Needs: No Transportation Needs (01/09/2023)   PRAPARE - Administrator, Civil Service (Medical): No    Lack of Transportation (Non-Medical): No  Physical Activity:  Not on file  Stress: Not on file  Social Connections: Not on file  Intimate Partner Violence: Not At Risk (01/09/2023)   Humiliation, Afraid, Rape, and Kick questionnaire    Fear of Current or Ex-Partner: No    Emotionally Abused: No    Physically Abused: No    Sexually Abused: No    Family History  Problem Relation Age of Onset   Hypertension Mother     Allergies  Allergen Reactions   Tramadol Nausea And Vomiting    Outpatient Medications Prior to Visit  Medication Sig Note   acetaminophen (TYLENOL) 325 MG tablet Place 2 tablets (650 mg total) into feeding tube every 6 (six) hours as needed for mild pain (or Fever  >/= 101). 06/12/2023: As needed   [DISCONTINUED] pantoprazole (PROTONIX) 40 MG tablet Take 1 tablet (40 mg total) by mouth daily.    [DISCONTINUED] folic acid (FOLVITE) 1 MG tablet Place 1 tablet (1 mg total) into feeding tube daily. (Patient not taking: Reported on 06/12/2023)    [DISCONTINUED] furosemide (LASIX) 20 MG tablet Place 1 tablet (20 mg total) into feeding tube daily. (Patient not taking: Reported on 02/16/2023)    [DISCONTINUED] lactulose (CHRONULAC) 10 GM/15ML solution Take 30 mLs (20 g total) by mouth 2 (two) times daily as needed for severe constipation. (Patient not taking: Reported on 02/16/2023)    [DISCONTINUED] lipase/protease/amylase 24000-76000 units CPEP Take 1 capsule (24,000 Units total) by mouth 3 (three) times daily before meals. (Patient not taking: Reported on 02/16/2023)    [DISCONTINUED] Nutritional Supplements (FEEDING SUPPLEMENT, OSMOLITE 1.5 CAL,) LIQD Place 237 mLs into feeding tube 6 (six) times daily. (Patient not taking: Reported on 02/16/2023)    [DISCONTINUED] ondansetron (ZOFRAN-ODT) 4 MG disintegrating tablet Take 1 tablet (4 mg total) by mouth every 8 (eight) hours as needed for nausea or vomiting. (Patient not taking: Reported on 02/16/2023)    [DISCONTINUED] spironolactone (ALDACTONE) 25 MG tablet Place 0.5 tablets (12.5 mg total) into feeding tube 2 (two) times daily. (Patient not taking: Reported on 02/16/2023)    [DISCONTINUED] thiamine (VITAMIN B-1) 100 MG tablet Place 1 tablet (100 mg total) into feeding tube daily. (Patient not taking: Reported on 02/16/2023)    [DISCONTINUED] traZODone (DESYREL) 50 MG tablet Take 1 tablet (50 mg total) by mouth at bedtime as needed for sleep. (Patient not taking: Reported on 06/12/2023)    No facility-administered medications prior to visit.    Review of Systems  Constitutional: Negative.  Negative for chills, fever, malaise/fatigue and weight loss.  HENT: Negative.  Negative for hearing loss, sinus pain and sore throat.    Eyes: Negative.   Respiratory: Negative.  Negative for cough, shortness of breath and stridor.   Cardiovascular: Negative.  Negative for chest pain, palpitations and leg swelling.  Gastrointestinal: Negative.  Negative for abdominal pain, constipation, diarrhea, heartburn, nausea and vomiting.  Genitourinary: Negative.  Negative for dysuria and flank pain.  Musculoskeletal: Negative.  Negative for joint pain and myalgias.  Skin: Negative.   Neurological: Negative.  Negative for dizziness and headaches.  Endo/Heme/Allergies: Negative.   Psychiatric/Behavioral: Negative.  Negative for depression and suicidal ideas. The patient is not nervous/anxious.        Objective:   BP 102/80   Pulse 89   Ht 6\' 2"  (1.88 m)   Wt 168 lb 3.2 oz (76.3 kg)   SpO2 96%   BMI 21.60 kg/m   Vitals:   06/12/23 1454  BP: 102/80  Pulse: 89  Height: 6\' 2"  (1.88  m)  Weight: 168 lb 3.2 oz (76.3 kg)  SpO2: 96%  BMI (Calculated): 21.59    Physical Exam Vitals and nursing note reviewed.  Constitutional:      General: He is not in acute distress.    Appearance: Normal appearance.  HENT:     Head: Normocephalic and atraumatic.     Nose: Nose normal.     Mouth/Throat:     Mouth: Mucous membranes are moist.     Pharynx: Oropharynx is clear.  Eyes:     Conjunctiva/sclera: Conjunctivae normal.     Pupils: Pupils are equal, round, and reactive to light.  Cardiovascular:     Rate and Rhythm: Normal rate and regular rhythm.     Pulses: Normal pulses.     Heart sounds: Normal heart sounds.  Pulmonary:     Effort: Pulmonary effort is normal.     Breath sounds: Normal breath sounds. No wheezing, rhonchi or rales.  Abdominal:     General: Bowel sounds are normal.     Palpations: Abdomen is soft. There is no mass.     Tenderness: There is no abdominal tenderness. There is no right CVA tenderness, left CVA tenderness, guarding or rebound.     Hernia: No hernia is present.  Musculoskeletal:         General: Normal range of motion.     Cervical back: Normal range of motion.     Right lower leg: No edema.     Left lower leg: No edema.  Skin:    General: Skin is warm and dry.     Findings: No rash.  Neurological:     General: No focal deficit present.     Mental Status: He is alert and oriented to person, place, and time.  Psychiatric:        Mood and Affect: Mood normal.        Behavior: Behavior normal.        Judgment: Judgment normal.      No results found for any visits on 06/12/23.  No results found for this or any previous visit (from the past 2160 hour(s)).    Assessment & Plan:  Continue medications, refills today. Check labs. Advised to stop smoking also.  Patient will think about it. Problem List Items Addressed This Visit     Alcohol abuse   Relevant Medications   thiamine (VITAMIN B-1) 100 MG tablet   GERD (gastroesophageal reflux disease)   Relevant Medications   Pancrelipase, Lip-Prot-Amyl, 24000-76000 units CPEP   pantoprazole (PROTONIX) 40 MG tablet   Other Relevant Orders   CBC with Diff   Pancreatitis - Primary   Relevant Medications   Pancrelipase, Lip-Prot-Amyl, 24000-76000 units CPEP   pantoprazole (PROTONIX) 40 MG tablet   Protein-calorie malnutrition, severe   Relevant Orders   CMP14+EGFR   Other Visit Diagnoses     History of alcohol abuse       Relevant Medications   folic acid (FOLVITE) 1 MG tablet       Return in about 6 months (around 12/10/2023).   Total time spent: 30 minutes  Margaretann Loveless, MD  06/12/2023   This document may have been prepared by [facility] Voice Recognition software and as such may include unintentional dictation errors.

## 2023-09-04 ENCOUNTER — Emergency Department: Payer: Medicaid Other

## 2023-09-04 ENCOUNTER — Other Ambulatory Visit: Payer: Self-pay

## 2023-09-04 ENCOUNTER — Inpatient Hospital Stay
Admission: EM | Admit: 2023-09-04 | Discharge: 2023-09-07 | DRG: 439 | Disposition: A | Discharge status: Home / Self Care | Payer: Medicaid Other | Attending: Internal Medicine | Admitting: Internal Medicine

## 2023-09-04 DIAGNOSIS — F1510 Other stimulant abuse, uncomplicated: Secondary | ICD-10-CM | POA: Diagnosis present

## 2023-09-04 DIAGNOSIS — K219 Gastro-esophageal reflux disease without esophagitis: Secondary | ICD-10-CM | POA: Diagnosis present

## 2023-09-04 DIAGNOSIS — F1910 Other psychoactive substance abuse, uncomplicated: Secondary | ICD-10-CM | POA: Diagnosis present

## 2023-09-04 DIAGNOSIS — F10230 Alcohol dependence with withdrawal, uncomplicated: Secondary | ICD-10-CM | POA: Diagnosis present

## 2023-09-04 DIAGNOSIS — Z8661 Personal history of infections of the central nervous system: Secondary | ICD-10-CM

## 2023-09-04 DIAGNOSIS — K7031 Alcoholic cirrhosis of liver with ascites: Secondary | ICD-10-CM | POA: Diagnosis present

## 2023-09-04 DIAGNOSIS — Z885 Allergy status to narcotic agent status: Secondary | ICD-10-CM | POA: Diagnosis not present

## 2023-09-04 DIAGNOSIS — F10930 Alcohol use, unspecified with withdrawal, uncomplicated: Secondary | ICD-10-CM

## 2023-09-04 DIAGNOSIS — K852 Alcohol induced acute pancreatitis without necrosis or infection: Principal | ICD-10-CM

## 2023-09-04 DIAGNOSIS — Z79899 Other long term (current) drug therapy: Secondary | ICD-10-CM | POA: Diagnosis not present

## 2023-09-04 DIAGNOSIS — F17210 Nicotine dependence, cigarettes, uncomplicated: Secondary | ICD-10-CM | POA: Diagnosis present

## 2023-09-04 DIAGNOSIS — Z8249 Family history of ischemic heart disease and other diseases of the circulatory system: Secondary | ICD-10-CM

## 2023-09-04 DIAGNOSIS — K859 Acute pancreatitis without necrosis or infection, unspecified: Secondary | ICD-10-CM | POA: Diagnosis present

## 2023-09-04 DIAGNOSIS — K760 Fatty (change of) liver, not elsewhere classified: Secondary | ICD-10-CM | POA: Diagnosis present

## 2023-09-04 DIAGNOSIS — R109 Unspecified abdominal pain: Secondary | ICD-10-CM | POA: Diagnosis present

## 2023-09-04 DIAGNOSIS — K449 Diaphragmatic hernia without obstruction or gangrene: Secondary | ICD-10-CM | POA: Diagnosis present

## 2023-09-04 LAB — URINALYSIS, ROUTINE W REFLEX MICROSCOPIC
Bilirubin Urine: NEGATIVE
Glucose, UA: NEGATIVE mg/dL
Hgb urine dipstick: NEGATIVE
Ketones, ur: NEGATIVE mg/dL
Leukocytes,Ua: NEGATIVE
Nitrite: NEGATIVE
Protein, ur: NEGATIVE mg/dL
Specific Gravity, Urine: >1.046 — ABNORMAL HIGH (ref 1.005–1.030)
pH: 5 (ref 5.0–8.0)

## 2023-09-04 LAB — TROPONIN I (HIGH SENSITIVITY)
Troponin I (High Sensitivity): 3 ng/L (ref <18)
Troponin I (High Sensitivity): 3 ng/L (ref <18)

## 2023-09-04 LAB — CBC
HCT: 40.7 % (ref 39.0–52.0)
Hemoglobin: 13.6 g/dL (ref 13.0–17.0)
MCH: 28.2 pg (ref 26.0–34.0)
MCHC: 33.4 g/dL (ref 30.0–36.0)
MCV: 84.4 fL (ref 80.0–100.0)
Platelets: 153 10*3/uL (ref 150–400)
RBC: 4.82 MIL/uL (ref 4.22–5.81)
RDW: 17.2 % — ABNORMAL HIGH (ref 11.5–15.5)
WBC: 10.9 10*3/uL — ABNORMAL HIGH (ref 4.0–10.5)
nRBC: 0 % (ref 0.0–0.2)

## 2023-09-04 LAB — COMPREHENSIVE METABOLIC PANEL
ALT: 28 U/L (ref 0–44)
AST: 64 U/L — ABNORMAL HIGH (ref 15–41)
Albumin: 3.9 g/dL (ref 3.5–5.0)
Alkaline Phosphatase: 98 U/L (ref 38–126)
Anion gap: 12 (ref 5–15)
BUN: 9 mg/dL (ref 6–20)
CO2: 24 mmol/L (ref 22–32)
Calcium: 9.1 mg/dL (ref 8.9–10.3)
Chloride: 104 mmol/L (ref 98–111)
Creatinine, Ser: 0.73 mg/dL (ref 0.61–1.24)
GFR, Estimated: >60 mL/min (ref >60)
Glucose, Bld: 139 mg/dL — ABNORMAL HIGH (ref 70–99)
Potassium: 4 mmol/L (ref 3.5–5.1)
Sodium: 140 mmol/L (ref 135–145)
Total Bilirubin: 1.3 mg/dL — ABNORMAL HIGH (ref 0.0–1.2)
Total Protein: 8.3 g/dL — ABNORMAL HIGH (ref 6.5–8.1)

## 2023-09-04 LAB — HIV ANTIBODY (ROUTINE TESTING W REFLEX): HIV Screen 4th Generation wRfx: NONREACTIVE

## 2023-09-04 LAB — LIPASE, BLOOD: Lipase: 976 U/L — ABNORMAL HIGH (ref 11–51)

## 2023-09-04 MED ORDER — HYDROMORPHONE HCL 1 MG/ML IJ SOLN
1.0000 mg | INTRAMUSCULAR | Status: AC
  Administered 2023-09-04: 1 mg via INTRAVENOUS
  Filled 2023-09-04: qty 1

## 2023-09-04 MED ORDER — IOHEXOL 300 MG/ML  SOLN
100.0000 mL | Freq: Once | INTRAMUSCULAR | Status: AC | PRN
  Administered 2023-09-04: 100 mL via INTRAVENOUS

## 2023-09-04 MED ORDER — SODIUM CHLORIDE 0.9 % IV BOLUS
1000.0000 mL | Freq: Once | INTRAVENOUS | Status: AC
  Administered 2023-09-04: 1000 mL via INTRAVENOUS

## 2023-09-04 MED ORDER — ADULT MULTIVITAMIN W/MINERALS CH
1.0000 | ORAL_TABLET | Freq: Every day | ORAL | Status: DC
  Administered 2023-09-04 – 2023-09-07 (×4): 1 via ORAL
  Filled 2023-09-04 (×4): qty 1

## 2023-09-04 MED ORDER — HYDROMORPHONE HCL 1 MG/ML IJ SOLN
0.5000 mg | INTRAMUSCULAR | Status: DC | PRN
  Administered 2023-09-04 – 2023-09-07 (×17): 1 mg via INTRAVENOUS
  Filled 2023-09-04 (×17): qty 1

## 2023-09-04 MED ORDER — FOLIC ACID 1 MG PO TABS
1.0000 mg | ORAL_TABLET | Freq: Every day | ORAL | Status: DC
Start: 2023-09-04 — End: 2023-09-07
  Administered 2023-09-04 – 2023-09-07 (×4): 1 mg via ORAL
  Filled 2023-09-04 (×4): qty 1

## 2023-09-04 MED ORDER — FOLIC ACID 1 MG PO TABS: 1.0000 mg | ORAL_TABLET | Freq: Every day | ORAL | Status: DC

## 2023-09-04 MED ORDER — THIAMINE HCL 100 MG/ML IJ SOLN
100.0000 mg | Freq: Every day | INTRAMUSCULAR | Status: DC
  Administered 2023-09-04: 100 mg via INTRAVENOUS
  Filled 2023-09-04: qty 2

## 2023-09-04 MED ORDER — PANTOPRAZOLE SODIUM 40 MG PO TBEC
40.0000 mg | DELAYED_RELEASE_TABLET | Freq: Every day | ORAL | Status: DC
  Administered 2023-09-04 – 2023-09-07 (×4): 40 mg via ORAL
  Filled 2023-09-04 (×4): qty 1

## 2023-09-04 MED ORDER — THIAMINE MONONITRATE 100 MG PO TABS
100.0000 mg | ORAL_TABLET | Freq: Every day | ORAL | Status: DC
  Administered 2023-09-05 – 2023-09-07 (×3): 100 mg via ORAL
  Filled 2023-09-04 (×3): qty 1

## 2023-09-04 MED ORDER — LORAZEPAM 2 MG/ML IJ SOLN
1.0000 mg | INTRAMUSCULAR | Status: DC | PRN
  Administered 2023-09-04: 1 mg via INTRAVENOUS
  Administered 2023-09-04: 4 mg via INTRAVENOUS
  Administered 2023-09-05 – 2023-09-06 (×2): 2 mg via INTRAVENOUS
  Filled 2023-09-04 (×2): qty 1
  Filled 2023-09-04: qty 2
  Filled 2023-09-04: qty 1

## 2023-09-04 MED ORDER — PANCRELIPASE (LIP-PROT-AMYL) 12000-38000 UNITS PO CPEP
24000.0000 [IU] | ORAL_CAPSULE | Freq: Three times a day (TID) | ORAL | Status: DC
  Administered 2023-09-04 – 2023-09-07 (×8): 24000 [IU] via ORAL
  Filled 2023-09-04 (×9): qty 2

## 2023-09-04 MED ORDER — ONDANSETRON HCL 4 MG/2ML IJ SOLN
4.0000 mg | Freq: Four times a day (QID) | INTRAMUSCULAR | Status: DC | PRN
  Administered 2023-09-04 (×2): 4 mg via INTRAVENOUS
  Filled 2023-09-04 (×3): qty 2

## 2023-09-04 MED ORDER — ONDANSETRON HCL 4 MG PO TABS: 4.0000 mg | ORAL_TABLET | Freq: Four times a day (QID) | ORAL | Status: DC | PRN

## 2023-09-04 MED ORDER — THIAMINE MONONITRATE 100 MG PO TABS: 100.0000 mg | ORAL_TABLET | Freq: Every day | ORAL | Status: DC

## 2023-09-04 MED ORDER — SODIUM CHLORIDE 0.9 % IV SOLN: INTRAVENOUS | Status: AC

## 2023-09-04 MED ORDER — LORAZEPAM 1 MG PO TABS
1.0000 mg | ORAL_TABLET | ORAL | Status: DC | PRN
  Administered 2023-09-04: 2 mg via ORAL
  Administered 2023-09-07: 1 mg via ORAL
  Filled 2023-09-04: qty 1
  Filled 2023-09-04: qty 2

## 2023-09-04 MED ORDER — ACETAMINOPHEN 325 MG PO TABS: 650.0000 mg | ORAL_TABLET | Freq: Four times a day (QID) | ORAL | Status: DC | PRN

## 2023-09-04 MED ORDER — ONDANSETRON HCL 4 MG/2ML IJ SOLN
4.0000 mg | INTRAMUSCULAR | Status: AC
  Administered 2023-09-04: 4 mg via INTRAVENOUS
  Filled 2023-09-04: qty 2

## 2023-09-04 NOTE — TOC Progression Note (Signed)
Transition of Care (TOC) - Progression Note    Patient Details  Name: Luke Rogers. MRN: [id number] Date of Birth: 1961/06/06  Transition of Care [facility]) CM/SW Contact  Tory Emerald, [place] Phone Number: 09/04/2023, 12:04 PM  Clinical Narrative:     CSW received a consult for substance use. SU resources placed in pt's AVS; no additional TOC needs at this time, CSW closing consult.       Expected Discharge Plan and Services                                               Social Determinants of Health (SDOH) Interventions SDOH Screenings   Food Insecurity: No Food Insecurity (01/09/2023)  Housing: Low Risk  (01/09/2023)  Transportation Needs: No Transportation Needs (01/09/2023)  Utilities: Not At Risk (01/09/2023)  Tobacco Use: High Risk (09/04/2023)    Readmission Risk Interventions    01/11/2023    9:17 AM  Readmission Risk Prevention Plan  Transportation Screening Complete  Medication Review (RN Care Manager) Complete  PCP or Specialist appointment within 3-5 days of discharge Complete  HRI or Home Care Consult Patient refused  SW Recovery Care/Counseling Consult Complete  Palliative Care Screening Not Applicable  Skilled Nursing Facility Not Applicable

## 2023-09-04 NOTE — ED Notes (Signed)
Cup of ice given by this Lisette Abu Special educational needs teacher) per request

## 2023-09-04 NOTE — ED Notes (Signed)
Called to check status of bed. RN gave the ok to bring pt to room.

## 2023-09-04 NOTE — Plan of Care (Signed)
Problem: Education: Goal: Knowledge of General Education information will improve Description: Including pain rating scale, medication(s)/side effects and non-pharmacologic comfort measures Outcome: Progressing   Problem: Health Behavior/Discharge Planning: Goal: Ability to manage health-related needs will improve Outcome: Progressing   Problem: Activity: Goal: Risk for activity intolerance will decrease Outcome: Progressing

## 2023-09-04 NOTE — ED Provider Notes (Signed)
[facility] Provider Note    Event Date/Time   First MD Initiated Contact with Patient 09/04/23 0715     (approximate)   History   Abdominal Pain   HPI  Luke Rogers. is a 63 y.o. male with a history of pancreatitis, cirrhosis, liver disease   Woke up at 12:30 in the morning with severe abdominal pain with vomiting.  Reports the pain to be "pancreatitis"  He does have a history of liver disease hepatitis and continued alcohol use.  He does not use his feeding tube and that was since removed  No fevers or chills no diarrhea.  Reports no bloody vomiting but having severe pain in his mid abdomen.  Physical Exam   Triage Vital Signs: ED Triage Vitals  Encounter Vitals Group     BP 09/04/23 0539 136/89     Systolic BP Percentile --      Diastolic BP Percentile --      Pulse Rate 09/04/23 0539 89     Resp 09/04/23 0539 20     Temp 09/04/23 0540 (!) 97.5 F (36.4 C)     Temp Source 09/04/23 0539 Oral     SpO2 09/04/23 0539 96 %     Weight 09/04/23 0540 175 lb (79.4 kg)     Height 09/04/23 0540 6\' 2"  (1.88 m)     Head Circumference --      Peak Flow --      Pain Score 09/04/23 0539 10     Pain Loc --      Pain Education --      Exclude from Growth Chart --     Most recent vital signs: Vitals:   09/04/23 0645 09/04/23 0720  BP: 114/69 (!) 126/111  Pulse: (!) 104 60  Resp: (!) 34 20  Temp:  (!) 97.5 F (36.4 C)  SpO2: 97% 99%     General: Awake, but appears in painful distress with any movement particularly reporting severe pain in his abdomen CV:  Good peripheral perfusion.  Mild tachycardia Resp:  Normal effort.  Clear bilateral Abd:  Perhaps just slightly mildly distended.  Very tender to touch especially over the epigastrium left upper quadrant.  No bruising or ecchymosis.  No obvious frank ascites.  No obvious hernias. Pain throughout exam but focally more so in the mid upper abdomen epigastric Other:  Warm well-perfused  extremities   ED Results / Procedures / Treatments   Labs (all labs ordered are listed, but only abnormal results are displayed) Labs Reviewed  LIPASE, BLOOD - Abnormal; Notable for the following components:      Result Value   Lipase 976 (*)    All other components within normal limits  COMPREHENSIVE METABOLIC PANEL - Abnormal; Notable for the following components:   Glucose, Bld 139 (*)    Total Protein 8.3 (*)    AST 64 (*)    Total Bilirubin 1.3 (*)    All other components within normal limits  CBC - Abnormal; Notable for the following components:   WBC 10.9 (*)    RDW 17.2 (*)    All other components within normal limits  URINALYSIS, ROUTINE W REFLEX MICROSCOPIC  TROPONIN I (HIGH SENSITIVITY)  TROPONIN I (HIGH SENSITIVITY)     EKG  He interpreted by me at 7 AM heart rate 60 QRS 90 QTc 480 Sinus rhythm, no evidence of acute ischemia.  Some artifact present   RADIOLOGY  CT ABDOMEN PELVIS W CONTRAST  Result Date: 09/04/2023 CLINICAL DATA:  Acute, severe pancreatitis EXAM: CT ABDOMEN AND PELVIS WITH CONTRAST TECHNIQUE: Multidetector CT imaging of the abdomen and pelvis was performed using the standard protocol following bolus administration of intravenous contrast. RADIATION DOSE REDUCTION: This exam was performed according to the departmental dose-optimization program which includes automated exposure control, adjustment of the mA and/or kV according to patient size and/or use of iterative reconstruction technique. CONTRAST:  OMNIPAQUE IOHEXOL 300 MG/ML  SOLN COMPARISON:  01/09/2023 FINDINGS: Lower chest: Moderate hiatal hernia which is sliding. Mild fat stranding in the lower mediastinum. Mild atelectasis at the lung bases. Hepatobiliary: Lobulated liver consistent with cirrhosis. No suspicious lesion when compared to prior.Full gallbladder without detected wall thickening. No biliary dilatation but there is a calcific density in the vicinity of the distal CBD, although  more in line with the distal main pancreatic duct. Pancreas: Fat stranding surrounds the pancreas which is edematous and expanded. Main duct dilatation to 4 mm with distal calculus that is punctate. No collection or necrosis. Previously seen pseudocysts are resolved. Spleen: Unremarkable. Adrenals/Urinary Tract: Negative adrenals. No hydronephrosis or stone. 2 cm cyst at the upper pole left kidney. There is an isodense lesion exophytic from the left upper pole measuring 2.2 cm, Bosniak category 2 cyst by abdominal MRI in 2023 . Small diverticulum projecting lateral from the mildly thick walled urinary bladder, central low-density better seen on prior. Stomach/Bowel:  No obstruction. No visible bowel inflammation. Vascular/Lymphatic: No acute vascular abnormality. 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 Thickened appearance of lymph nodes in the deep liver distribution and peripancreatic region, likely reactive Reproductive:No pathologic findings. Other: No ascites or pneumoperitoneum. Musculoskeletal: No acute abnormalities. IMPRESSION: 1. Acute edematous pancreatitis with punctate stone at the distal main pancreatic duct. No organized collection or necrosis. 2. Cirrhosis. 3. Moderate hiatal hernia. Electronically Signed   By: Tiburcio Pea M.D.   On: 09/04/2023 08:29   DG Chest 2 View Result Date: 09/04/2023 CLINICAL DATA:  Shortness of breath. EXAM: CHEST - 2 VIEW COMPARISON:  12/26/2022. FINDINGS: Bilateral lung fields are clear. Bilateral costophrenic angles are clear. Normal cardio-mediastinal silhouette. Small-to-moderate sized retrocardiac hiatal hernia noted. No acute osseous abnormalities. The soft tissues are within normal limits. IMPRESSION: No active cardiopulmonary disease. Electronically Signed   By: Jules Schick M.D.   On: 09/04/2023 07:48      Discussed CT findings of punctate stone distal main pancreatic duct edematous pancreatitis with Dr. Servando Snare.  He advises  symptomatic management of pancreatitis is the most reasonable option at this time   PROCEDURES:  Critical Care performed: No  Procedures   MEDICATIONS ORDERED IN ED: Medications  HYDROmorphone (DILAUDID) injection 1 mg (has no administration in time range)  HYDROmorphone (DILAUDID) injection 1 mg (1 mg Intravenous Given 09/04/23 0741)  ondansetron (ZOFRAN) injection 4 mg (4 mg Intravenous Given 09/04/23 0740)  sodium chloride 0.9 % bolus 1,000 mL (1,000 mLs Intravenous New Bag/Given 09/04/23 0743)  iohexol (OMNIPAQUE) 300 MG/ML solution 100 mL (100 mLs Intravenous Contrast Given 09/04/23 0746)     IMPRESSION / MDM / ASSESSMENT AND PLAN / ED COURSE  I reviewed the triage vital signs and the nursing notes.                              Differential diagnosis includes but is not limited to, abdominal perforation, aortic dissection, cholecystitis, appendicitis, diverticulitis, colitis, esophagitis/gastritis, kidney stone, pyelonephritis, urinary tract  infection, aortic aneurysm. All are considered in decision and treatment plan. Based upon the patient's presentation and risk factors, his prior clinical history also considered as well as a description of pain and symptoms I suspect this is likely acute pancreatitis however other considerations would be perforation peptic ulcer, cholelithiasis choledocholithiasis, diverticular acute severe gastritis etc.  Proceed with pain control including use of hydromorphone which patient reports has had good effect in the past, rehydrate antiemetic.  He does not use alcohol regularly does experience withdrawals, but last had alcohol within 12 hours currently showing no evidence of withdrawal.   Patient's presentation is most consistent with acute complicated illness / injury requiring diagnostic workup.  ----------------------------------------- 9:06 AM on 09/04/2023 ----------------------------------------- , Patient still appears to be in pain reports  the pain is starting to come back with intensity in the mid abdomen..  Will give additional hydromorphone plan to admit to hospitalist service  Consulted with our hospitalist Dr. Chipper Herb.  Patient understanding agreeable with plan for admission        FINAL CLINICAL IMPRESSION(S) / ED DIAGNOSES   Final diagnoses:  Alcohol-induced acute pancreatitis, unspecified complication status     Rx / DC Orders   ED Discharge Orders     None        Note:  This document was prepared using Dragon voice recognition software and may include unintentional dictation errors.   Sharyn Creamer, MD 09/04/23 [phone]

## 2023-09-04 NOTE — ED Notes (Signed)
Wife reports pt is c/o [facility] and "clutching chest"; pt taken to triage via w/c for reassessment; pt is hyperventilating; EKG and vs completed; pt slows breathing after much encouragement and instructions; informed wife & pt that add'l orders have been completed to assess pt's new complaints; both voice good understanding

## 2023-09-04 NOTE — ED Notes (Signed)
Pt to CT

## 2023-09-04 NOTE — Discharge Instructions (Signed)

## 2023-09-04 NOTE — ED Triage Notes (Signed)
Pt reports abd pain n/v that began this morning. Pt reports hx pancreatitis.

## 2023-09-04 NOTE — H&P (Signed)
History and Physical    Luke Rogers. [name] DOB: 1961-06-27 DOA: 09/04/2023  PCP: Margaretann Loveless, MD (Confirm with patient/family/NH records and if not entered, this has to be entered at [facility] point of entry) Patient coming from: Home  I have personally briefly reviewed patient's old medical records in [facility]  Chief Complaint: Belly hurts  HPI: Luke Rogers. is a 63 y.o. male with medical history significant of recurrent alcoholic pancreatitis, alcohol abuse, GERD, chronic pancreatic insufficiency, polysubstance abuse, presented with acute onset of abdominal pain.  Patient reported that he has been sober till recently, he started to binge drinking again and last drink was last night.  This morning, patient woke up with severe epigastric abdominal pain radiating to the back associated with nausea and " bloating" no vomiting no diarrhea no fever or chills.  ED Course: Afebrile, nontachycardic blood pressure 120/90, not hypoxic.  CT showed acute pancreatitis with edema and punctate stone at the distal main pancreatic duct.  Liver cirrhosis.  Blood work showed lipase 976, creatinine 0.7, AST 64, ALT 28, bilirubin 1.3, WBC 10.9.  Patient was started on IV fluid and pain medications in the ED.  Review of Systems: As per HPI otherwise 14 point review of systems negative.    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   Hyperbilirubinemia 12/18/2021   Hypomagnesemia 12/22/2021   Hyponatremia 12/18/2021   Mass of soft tissue of left lower extremity 10/03/2022   Metabolic acidosis 12/18/2021    Pancreatitis    PEG (percutaneous endoscopic gastrostomy) adjustment/replacement/removal (HCC) 01/16/2023   Status post insertion of percutaneous endoscopic gastrostomy (PEG) tube (HCC) 01/09/2023   Transaminitis 08/24/2014   Tremor 08/24/2014   Visual hallucination    Visual hallucinations 12/18/2021   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    Past Surgical History:  Procedure Laterality Date   FOOT SURGERY Bilateral    As infant   INSERTION OF MESH Bilateral 09/25/2022   Procedure: INSERTION OF MESH;  Surgeon: Campbell Lerner, MD;  Location: [facility] ORS;  Service: General;  Laterality: Bilateral;   PEG PLACEMENT N/A 12/08/2022   Procedure: PERCUTANEOUS ENDOSCOPIC GASTROSTOMY (PEG) PLACEMENT;  Surgeon: Toledo, Boykin Nearing, MD;  Location: [facility] ENDOSCOPY;  Service: Gastroenterology;  Laterality: N/A;   WRIST SURGERY Right      reports that he has been smoking cigarettes. He has been exposed to tobacco smoke. He has never used smokeless tobacco. He reports that he does not currently use alcohol after a past usage of about 63.0 standard drinks of alcohol per week. He reports current drug use. Drug: Amphetamines.  Allergies  Allergen Reactions   Tramadol Nausea And Vomiting    Family History  Problem Relation Age of Onset   Hypertension Mother      Prior to Admission medications   Medication Sig Start Date End Date Taking? Authorizing Provider  acetaminophen (TYLENOL) 325 MG tablet Place 2 tablets (650 mg total) into feeding tube every 6 (six) hours as needed for mild pain (or Fever >/= 101). 12/19/22  Yes Loyce Dys, MD  folic acid (FOLVITE)  1 MG tablet Take 1 tablet (1 mg total) by mouth daily. 06/12/23  Yes Margaretann Loveless, MD  Pancrelipase, Lip-Prot-Amyl, 24000-76000 units CPEP Take 1 capsule (24,000 Units total) by mouth 3 (three) times daily before meals. 06/12/23  Yes Margaretann Loveless, MD  pantoprazole (PROTONIX) 40 MG tablet Take 1 tablet (40 mg total) by mouth daily.  06/12/23  Yes Margaretann Loveless, MD  thiamine (VITAMIN B-1) 100 MG tablet Take 1 tablet (100 mg total) by mouth daily. 06/12/23  Yes Margaretann Loveless, MD    Physical Exam: Vitals:   09/04/23 0540 09/04/23 0645 09/04/23 0720 09/04/23 1033  BP:  114/69 (!) 126/111 (!) 122/95  Pulse:  (!) 104 60 64  Resp:  (!) 34 20 (!) 24  Temp: (!) 97.5 F (36.4 C)  (!) 97.5 F (36.4 C) (!) 96.5 F (35.8 C)  TempSrc: Oral  Axillary Axillary  SpO2:  97% 99% 94%  Weight: 79.4 kg     Height: 6\' 2"  (1.88 m)       Constitutional: NAD, calm, comfortable Vitals:   09/04/23 0540 09/04/23 0645 09/04/23 0720 09/04/23 1033  BP:  114/69 (!) 126/111 (!) 122/95  Pulse:  (!) 104 60 64  Resp:  (!) 34 20 (!) 24  Temp: (!) 97.5 F (36.4 C)  (!) 97.5 F (36.4 C) (!) 96.5 F (35.8 C)  TempSrc: Oral  Axillary Axillary  SpO2:  97% 99% 94%  Weight: 79.4 kg     Height: 6\' 2"  (1.88 m)      Eyes: PERRL, lids and conjunctivae normal ENMT: Mucous membranes are dry. Posterior pharynx clear of any exudate or lesions.Normal dentition.  Neck: normal, supple, no masses, no thyromegaly Respiratory: clear to auscultation bilaterally, no wheezing, no crackles. Normal respiratory effort. No accessory muscle use.  Cardiovascular: Regular rate and rhythm, no murmurs / rubs / gallops. No extremity edema. 2+ pedal pulses. No carotid bruits.  Abdomen: Tenderness on epigastric area, no rebound no guarding, no masses palpated. No hepatosplenomegaly. Bowel sounds positive.  Musculoskeletal: no clubbing / cyanosis. No joint deformity upper and lower extremities. Good ROM, no contractures. Normal muscle tone.  Skin: no rashes, lesions, ulcers. No induration Neurologic: CN 2-12 grossly intact. Sensation intact, DTR normal. Strength 5/5 in all 4.  Psychiatric: Normal judgment and insight. Alert and oriented x 3. Normal mood.     Labs on Admission: I have personally reviewed following labs and imaging studies  CBC: Recent Labs  Lab  09/04/23 0541  WBC 10.9*  HGB 13.6  HCT 40.7  MCV 84.4  PLT 153   Basic Metabolic Panel: Recent Labs  Lab 09/04/23 0541  NA 140  K 4.0  CL 104  CO2 24  GLUCOSE 139*  BUN 9  CREATININE 0.73  CALCIUM 9.1   GFR: Estimated Creatinine Clearance: 110.3 mL/min (by C-G formula based on SCr of 0.73 mg/dL). Liver Function Tests: Recent Labs  Lab 09/04/23 0541  AST 64*  ALT 28  ALKPHOS 98  BILITOT 1.3*  PROT 8.3*  ALBUMIN 3.9   Recent Labs  Lab 09/04/23 0541  LIPASE 976*   No results for input(s): "AMMONIA" in the last 168 hours. Coagulation Profile: No results for input(s): "INR", "PROTIME" in the last 168 hours. Cardiac Enzymes: No results for input(s): "CKTOTAL", "CKMB", "CKMBINDEX", "TROPONINI" in the last 168 hours. BNP (last 3 results) No results for input(s): "PROBNP" in the last 8760 hours. HbA1C: No results for input(s): "HGBA1C" in the last 72 hours. CBG:  No results for input(s): "GLUCAP" in the last 168 hours. Lipid Profile: No results for input(s): "CHOL", "HDL", "LDLCALC", "TRIG", "CHOLHDL", "LDLDIRECT" in the last 72 hours. Thyroid Function Tests: No results for input(s): "TSH", "T4TOTAL", "FREET4", "T3FREE", "THYROIDAB" in the last 72 hours. Anemia Panel: No results for input(s): "VITAMINB12", "FOLATE", "FERRITIN", "TIBC", "IRON", "RETICCTPCT" in the last 72 hours. Urine analysis:    Component Value Date/Time   COLORURINE YELLOW (A) 09/04/2023 1025   APPEARANCEUR CLEAR (A) 09/04/2023 1025   LABSPEC >1.046 (H) 09/04/2023 1025   PHURINE 5.0 09/04/2023 1025   GLUCOSEU NEGATIVE 09/04/2023 1025   HGBUR NEGATIVE 09/04/2023 1025   BILIRUBINUR NEGATIVE 09/04/2023 1025   KETONESUR NEGATIVE 09/04/2023 1025   PROTEINUR NEGATIVE 09/04/2023 1025   NITRITE NEGATIVE 09/04/2023 1025   LEUKOCYTESUR NEGATIVE 09/04/2023 1025    Radiological Exams on Admission: CT ABDOMEN PELVIS W CONTRAST Result Date: 09/04/2023 CLINICAL DATA:  Acute, severe pancreatitis  EXAM: CT ABDOMEN AND PELVIS WITH CONTRAST TECHNIQUE: Multidetector CT imaging of the abdomen and pelvis was performed using the standard protocol following bolus administration of intravenous contrast. RADIATION DOSE REDUCTION: This exam was performed according to the departmental dose-optimization program which includes automated exposure control, adjustment of the mA and/or kV according to patient size and/or use of iterative reconstruction technique. CONTRAST:  OMNIPAQUE IOHEXOL 300 MG/ML  SOLN COMPARISON:  01/09/2023 FINDINGS: Lower chest: Moderate hiatal hernia which is sliding. Mild fat stranding in the lower mediastinum. Mild atelectasis at the lung bases. Hepatobiliary: Lobulated liver consistent with cirrhosis. No suspicious lesion when compared to prior.Full gallbladder without detected wall thickening. No biliary dilatation but there is a calcific density in the vicinity of the distal CBD, although more in line with the distal main pancreatic duct. Pancreas: Fat stranding surrounds the pancreas which is edematous and expanded. Main duct dilatation to 4 mm with distal calculus that is punctate. No collection or necrosis. Previously seen pseudocysts are resolved. Spleen: Unremarkable. Adrenals/Urinary Tract: Negative adrenals. No hydronephrosis or stone. 2 cm cyst at the upper pole left kidney. There is an isodense lesion exophytic from the left upper pole measuring 2.2 cm, Bosniak category 2 cyst by abdominal MRI in 2023 . Small diverticulum projecting lateral from the mildly thick walled urinary bladder, central low-density better seen on prior. Stomach/Bowel:  No obstruction. No visible bowel inflammation. Vascular/Lymphatic: No acute vascular abnormality. 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 Thickened appearance of lymph nodes in the deep liver distribution and peripancreatic region, likely reactive Reproductive:No pathologic findings. Other: No ascites or  pneumoperitoneum. Musculoskeletal: No acute abnormalities. IMPRESSION: 1. Acute edematous pancreatitis with punctate stone at the distal main pancreatic duct. No organized collection or necrosis. 2. Cirrhosis. 3. Moderate hiatal hernia. Electronically Signed   By: Tiburcio Pea M.D.   On: 09/04/2023 08:29   DG Chest 2 View Result Date: 09/04/2023 CLINICAL DATA:  Shortness of breath. EXAM: CHEST - 2 VIEW COMPARISON:  12/26/2022. FINDINGS: Bilateral lung fields are clear. Bilateral costophrenic angles are clear. Normal cardio-mediastinal silhouette. Small-to-moderate sized retrocardiac hiatal hernia noted. No acute osseous abnormalities. The soft tissues are within normal limits. IMPRESSION: No active cardiopulmonary disease. Electronically Signed   By: Jules Schick M.D.   On: 09/04/2023 07:48    EKG: Independently reviewed.  Sinus rhythm, no acute ST changes.  Assessment/Plan Principal Problem:   Pancreatitis Active Problems:   Alcohol induced acute pancreatitis  (please populate well all problems here in Problem List. (For example, if patient is on  BP meds at home and you resume or decide to hold them, it is a problem that needs to be her. Same for CAD, COPD, HLD and so on)  Acute alcoholic pancreatitis, recurrent, BISAP=0 -N.p.o., IV fluid -No indication for antibiotics -Supportive care, Dilaudid for pain and Zofran for nauseous vomiting -ED physician discussed with Dr. Servando Snare about the CT abdominal finding of stone in the distal pancreatic duct, which likely represent a chronic pancreatitis.  GI recommended no inpatient intervention needed at this point.  Cirrhosis -Euvolemic appears to be well compensated -No ascites  Alcohol abuse -No symptoms signs of acute withdrawal -CIWA protocol with as needed benzos  Polysubstance abuse -Outpatient PCP follow-up  DVT prophylaxis: SCD Code Status: Full code Family Communication: None the bedside Disposition Plan: Patient is sick with  pancreatitis, requiring n.p.o. IV fluid and IV pain medication, expect more than 2 midnight hospital stay Consults called: Curbside consult with GI, reconsult if indicated Admission status: Medsurg admit   Emeline General MD [facility] Pager [phone]  09/04/2023, 11:32 AM

## 2023-09-04 NOTE — ED Notes (Signed)
Pt became nauseous and stated that he was "too hot." Temperature rechecked axillary result 97.76F. Bear hugger removed from pt.

## 2023-09-05 ENCOUNTER — Encounter: Payer: Self-pay | Admitting: Internal Medicine

## 2023-09-05 DIAGNOSIS — K852 Alcohol induced acute pancreatitis without necrosis or infection: Secondary | ICD-10-CM | POA: Diagnosis not present

## 2023-09-05 DIAGNOSIS — F10930 Alcohol use, unspecified with withdrawal, uncomplicated: Secondary | ICD-10-CM

## 2023-09-05 LAB — LIPASE, BLOOD: Lipase: 326 U/L — ABNORMAL HIGH (ref 11–51)

## 2023-09-05 LAB — COMPREHENSIVE METABOLIC PANEL
ALT: 22 U/L (ref 0–44)
AST: 46 U/L — ABNORMAL HIGH (ref 15–41)
Albumin: 3.1 g/dL — ABNORMAL LOW (ref 3.5–5.0)
Alkaline Phosphatase: 81 U/L (ref 38–126)
Anion gap: 7 (ref 5–15)
BUN: 11 mg/dL (ref 6–20)
CO2: 26 mmol/L (ref 22–32)
Calcium: 8.6 mg/dL — ABNORMAL LOW (ref 8.9–10.3)
Chloride: 105 mmol/L (ref 98–111)
Creatinine, Ser: 0.64 mg/dL (ref 0.61–1.24)
GFR, Estimated: >60 mL/min (ref >60)
Glucose, Bld: 107 mg/dL — ABNORMAL HIGH (ref 70–99)
Potassium: 3.9 mmol/L (ref 3.5–5.1)
Sodium: 138 mmol/L (ref 135–145)
Total Bilirubin: 1.7 mg/dL — ABNORMAL HIGH (ref 0.0–1.2)
Total Protein: 7.2 g/dL (ref 6.5–8.1)

## 2023-09-05 LAB — CBC
HCT: 39.6 % (ref 39.0–52.0)
Hemoglobin: 13 g/dL (ref 13.0–17.0)
MCH: 28.2 pg (ref 26.0–34.0)
MCHC: 32.8 g/dL (ref 30.0–36.0)
MCV: 85.9 fL (ref 80.0–100.0)
Platelets: 130 10*3/uL — ABNORMAL LOW (ref 150–400)
RBC: 4.61 MIL/uL (ref 4.22–5.81)
RDW: 17.5 % — ABNORMAL HIGH (ref 11.5–15.5)
WBC: 12.3 10*3/uL — ABNORMAL HIGH (ref 4.0–10.5)
nRBC: 0 % (ref 0.0–0.2)

## 2023-09-05 NOTE — Progress Notes (Signed)
Progress Note   Patient: Luke Rogers. [id number] DOB: August 01, 1960 DOA: 09/04/2023     1 DOS: the patient was seen and examined on 09/05/2023   Brief hospital course: Luke Rogers. is a 63 y.o. male with medical history significant for recurrent alcoholic pancreatitis, alcohol abuse, GERD, chronic pancreatic insufficiency, polysubstance abuse, presented with acute onset of abdominal pain.   Patient reported that he has been sober till recently, he started to binge drinking again and last drink was last night.  This morning, patient woke up with severe epigastric abdominal pain radiating to the back associated with nausea and " bloating" no vomiting no diarrhea no fever or chills.   ED Course: Afebrile, nontachycardic blood pressure 120/90, not hypoxic.  CT showed acute pancreatitis with edema and punctate stone at the distal main pancreatic duct.  Liver cirrhosis.  Blood work showed lipase 976, creatinine 0.7, AST 64, ALT 28, bilirubin 1.3, WBC 10.9.   Patient was started on IV fluid and pain medications in the ED.     Assessment and Plan: Principal Problem:   Pancreatitis Active Problems:   Alcohol induced acute pancreatitis    Acute alcoholic pancreatitis, recurrent, BISAP=0 -Continue n.p.o. n.p.o., IV fluid hydration -No indication for antibiotics -Supportive care, Dilaudid for pain and Zofran for nauseous vomiting -ED physician discussed with Dr. Servando Snare about the CT abdominal finding of stone in the distal pancreatic duct, which likely represent a chronic pancreatitis.  GI recommended no inpatient intervention needed at this point. Will obtain MRCP   Alcoholic liver cirrhosis -Euvolemic appears to be well compensated -No ascites   Alcohol abuse with withdrawal symptoms -Patient noted to have tremors at rest but is oriented to person, place and time -CIWA protocol with lorazepam as needed for CIWA score of 8 or greater   Polysubstance abuse -Outpatient PCP  follow-up        Subjective: Sitting up in bed.  Has tremors at rest.   Physical Exam: Vitals:   09/04/23 2249 09/04/23 2251 09/05/23 0523 09/05/23 0754  BP: 115/77 115/77 124/77 124/84  Pulse: 87 87 87 89  Resp:   18 18  Temp:   97.9 F (36.6 C) 98 F (36.7 C)  TempSrc:      SpO2:   90% 92%  Weight:      Height:       Eyes: PERRL, lids and conjunctivae normal ENMT: Mucous membranes are moist. Posterior pharynx clear of any exudate or lesions.Normal dentition.  Neck: normal, supple, no masses, no thyromegaly Respiratory: clear to auscultation bilaterally, no wheezing, no crackles. Normal respiratory effort. No accessory muscle use.  Cardiovascular: Regular rate and rhythm, no murmurs / rubs / gallops. No extremity edema. 2+ pedal pulses. No carotid bruits.  Abdomen: Tenderness on epigastric area, no rebound no guarding, no masses palpated. No hepatosplenomegaly. Bowel sounds positive.  Musculoskeletal: no clubbing / cyanosis. No joint deformity upper and lower extremities. Good ROM, no contractures. Normal muscle tone.  Skin: no rashes, lesions, ulcers. No induration Neurologic: CN 2-12 grossly intact. Sensation intact, DTR normal. Strength 5/5 in all 4.  Psychiatric: Normal judgment and insight. Alert and oriented x 3. Normal mood.      Data Reviewed: Lipase shows  a downward trend. 976 >> 326 There are no new results to review at this time.  Family Communication: Plan of care discussed with patient in detail  Disposition: Status is: Inpatient Remains inpatient appropriate because: Treatment for acute pancreatitis  Planned Discharge Destination: Home  Time spent: 36 minutes  Author: Lucile Shutters, MD 09/05/2023 12:46 PM  For on call review [email or web address].

## 2023-09-05 NOTE — Plan of Care (Signed)
Problem: Education: Goal: Knowledge of General Education information will improve Description: Including pain rating scale, medication(s)/side effects and non-pharmacologic comfort measures Outcome: Progressing   Problem: Health Behavior/Discharge Planning: Goal: Ability to manage health-related needs will improve Outcome: Progressing   Problem: Activity: Goal: Risk for activity intolerance will decrease Outcome: Progressing

## 2023-09-06 ENCOUNTER — Inpatient Hospital Stay: Payer: Medicaid Other

## 2023-09-06 DIAGNOSIS — K852 Alcohol induced acute pancreatitis without necrosis or infection: Secondary | ICD-10-CM | POA: Diagnosis not present

## 2023-09-06 LAB — BASIC METABOLIC PANEL
Anion gap: 7 (ref 5–15)
BUN: 14 mg/dL (ref 6–20)
CO2: 26 mmol/L (ref 22–32)
Calcium: 8.8 mg/dL — ABNORMAL LOW (ref 8.9–10.3)
Chloride: 102 mmol/L (ref 98–111)
Creatinine, Ser: 0.66 mg/dL (ref 0.61–1.24)
GFR, Estimated: >60 mL/min (ref >60)
Glucose, Bld: 81 mg/dL (ref 70–99)
Potassium: 3.7 mmol/L (ref 3.5–5.1)
Sodium: 135 mmol/L (ref 135–145)

## 2023-09-06 LAB — MAGNESIUM: Magnesium: 1.9 mg/dL (ref 1.7–2.4)

## 2023-09-06 MED ORDER — GADOBUTROL 1 MMOL/ML IV SOLN
8.0000 mL | Freq: Once | INTRAVENOUS | Status: AC | PRN
Start: 2023-09-06 — End: 2023-09-06
  Administered 2023-09-06: 8 mL via INTRAVENOUS

## 2023-09-06 NOTE — Progress Notes (Signed)
Progress Note   Patient: Luke Rogers. [id number] DOB: 06/14/1961 DOA: 09/04/2023     2 DOS: the patient was seen and examined on 09/06/2023   Brief hospital course: Luke Rogers. is a 63 y.o. male with medical history significant for recurrent alcoholic pancreatitis, alcohol abuse, GERD, chronic pancreatic insufficiency, polysubstance abuse, presented with acute onset of abdominal pain.   Patient reported that he has been sober till recently, he started to binge drinking again and last drink was last night.  This morning, patient woke up with severe epigastric abdominal pain radiating to the back associated with nausea and " bloating" no vomiting no diarrhea no fever or chills.   ED Course: Afebrile, nontachycardic blood pressure 120/90, not hypoxic.  CT showed acute pancreatitis with edema and punctate stone at the distal main pancreatic duct.  Liver cirrhosis.  Blood work showed lipase 976, creatinine 0.7, AST 64, ALT 28, bilirubin 1.3, WBC 10.9.   Patient was started on IV fluid and pain medications in the ED.   Assessment and Plan: Principal Problem:   Pancreatitis Active Problems:   Alcohol induced acute pancreatitis     Acute alcoholic pancreatitis, recurrent, BISAP=0 -Continues to have abdominal pain but improved from admission. -No indication for antibiotics -Supportive care, Dilaudid for pain and Zofran for nauseous vomiting -MRCP showed findings compatible with acute pancreatitis. No evidence of pancreatic necrosis or other acute complicating features at this time. No choledocholithiasis or findings of biliary tract obstruction. Hepatic cirrhosis with mild hepatic steatosis. Small volume of ascites. Large hiatal hernia. -Start clear liquid diet      Alcoholic liver cirrhosis -Euvolemic appears to be well compensated -No ascites    Alcohol abuse with withdrawal symptoms -Improved tremors.  Oriented to person, place and time -CIWA protocol with  lorazepam as needed for CIWA score of 8 or greater -Patient advised to abstain from further alcohol use    Polysubstance abuse -Outpatient PCP follow-up        Subjective: Abdominal pain persists but improved. Denies having any nausea vomiting  Physical Exam: Vitals:   09/05/23 1622 09/05/23 1943 09/06/23 0423 09/06/23 0825  BP: 132/89 (!) 117/95 123/83 119/72  Pulse: 99 (!) 109 79 69  Resp: 18 18 16 20   Temp: 98 F (36.7 C) 98 F (36.7 C) 99.6 F (37.6 C) 97.8 F (36.6 C)  TempSrc:   Oral   SpO2: 90% 96% 92% 95%  Weight:      Height:       Eyes: PERRL, lids and conjunctivae normal ENMT: Mucous membranes are moist. Posterior pharynx clear of any exudate or lesions.Normal dentition.  Neck: normal, supple, no masses, no thyromegaly Respiratory: clear to auscultation bilaterally, no wheezing, no crackles. Normal respiratory effort. No accessory muscle use.  Cardiovascular: Regular rate and rhythm, no murmurs / rubs / gallops. No extremity edema. 2+ pedal pulses. No carotid bruits.  Abdomen: Tenderness on epigastric area, no rebound no guarding, no masses palpated. No hepatosplenomegaly. Bowel sounds positive.  Musculoskeletal: no clubbing / cyanosis. No joint deformity upper and lower extremities. Good ROM, no contractures. Normal muscle tone.  Skin: no rashes, lesions, ulcers. No induration Neurologic: CN 2-12 grossly intact. Sensation intact, DTR normal. Strength 5/5 in all 4.  Psychiatric: Normal judgment and insight. Alert and oriented x 3. Normal mood.       Data Reviewed: Labs reviewed. There are no new results to review at this time.  Family Communication: Plan of care discussed with patient in detail.  He verbalizes understanding and agrees with the plan.  Disposition: Status is: Inpatient Remains inpatient appropriate because: Admitted for acute alcohol pancreatitis.  Possible discharge in a.m.  Planned Discharge Destination: Home    Time spent: 34  minutes  Author: Lucile Shutters, MD 09/06/2023 1:08 PM  For on call review [email or web address].

## 2023-09-07 DIAGNOSIS — K852 Alcohol induced acute pancreatitis without necrosis or infection: Secondary | ICD-10-CM | POA: Diagnosis not present

## 2023-09-07 LAB — BASIC METABOLIC PANEL
Anion gap: 7 (ref 5–15)
BUN: 12 mg/dL (ref 6–20)
CO2: 24 mmol/L (ref 22–32)
Calcium: 8.4 mg/dL — ABNORMAL LOW (ref 8.9–10.3)
Chloride: 103 mmol/L (ref 98–111)
Creatinine, Ser: 0.68 mg/dL (ref 0.61–1.24)
GFR, Estimated: >60 mL/min (ref >60)
Glucose, Bld: 104 mg/dL — ABNORMAL HIGH (ref 70–99)
Potassium: 3.6 mmol/L (ref 3.5–5.1)
Sodium: 134 mmol/L — ABNORMAL LOW (ref 135–145)

## 2023-09-07 LAB — LIPASE, BLOOD: Lipase: 62 U/L — ABNORMAL HIGH (ref 11–51)

## 2023-09-07 NOTE — Discharge Summary (Signed)
Physician Discharge Summary   Patient: Luke Rogers. MRN: [id number] DOB: February 06, 1961  Admit date:     09/04/2023  Discharge date: 09/07/23  Discharge Physician: Raynald Rouillard   PCP: Margaretann Loveless, MD   Recommendations at discharge:   Abstain from further alcohol use  Discharge Diagnoses: Principal Problem:   Alcohol induced acute pancreatitis Active Problems:   Recurrent acute pancreatitis   Polysubstance abuse (HCC)   Alcohol withdrawal syndrome without complication (HCC)  Resolved Problems:   * No resolved hospital problems. *  Hospital Course: Luke Okane. is a 63 y.o. male with medical history significant of recurrent alcoholic pancreatitis, alcohol abuse, GERD, chronic pancreatic insufficiency, polysubstance abuse, presented with acute onset of abdominal pain.   Patient reported that he has been sober till recently, he started to binge drink again and last drink was last night prior to admission.  This morning, patient woke up with severe epigastric abdominal pain radiating to the back associated with nausea and " bloating" no vomiting no diarrhea no fever or chills.   ED Course: Afebrile, nontachycardic blood pressure 120/90, not hypoxic.  CT showed acute pancreatitis with edema and punctate stone at the distal main pancreatic duct.  Liver cirrhosis.  Blood work showed lipase 976, creatinine 0.7, AST 64, ALT 28, bilirubin 1.3, WBC 10.9.   Patient was started on IV fluid and pain medications in the ED.   Review of Systems: As per HPI otherwise 14 point review of systems negative.     Assessment and Plan:   Acute alcoholic pancreatitis, recurrent, BISAP=0 -Abdominal pain has resolved and patient requesting to be discharged -MRCP showed findings compatible with acute pancreatitis. No evidence of pancreatic necrosis or other acute complicating features at this time. No choledocholithiasis or findings of biliary tract obstruction. Hepatic cirrhosis with  mild hepatic steatosis. Small volume of ascites. Large hiatal hernia. -Patient was started on clear liquid diet and advance to regular diet which she tolerated -He has been advised to abstain from further alcohol use       Alcoholic liver cirrhosis -Euvolemic appears to be well compensated -No ascites     Alcohol abuse with withdrawal symptoms -Improved tremors.  Oriented to person, place and time -Was treated with lorazepam during this hospitalization -Patient advised to abstain from further alcohol use     Polysubstance abuse -Outpatient PCP follow-up            Consultants: None Procedures performed: None  Disposition: Home Diet recommendation:  Discharge Diet Orders (From admission, onward)     Start     Ordered   09/07/23 0000  Diet general        09/07/23 0831           Regular diet DISCHARGE MEDICATION: Allergies as of 09/07/2023       Reactions   Tramadol Nausea And Vomiting        Medication List     TAKE these medications    acetaminophen 325 MG tablet Commonly known as: TYLENOL Place 2 tablets (650 mg total) into feeding tube every 6 (six) hours as needed for mild pain (or Fever >/= 101).   folic acid 1 MG tablet Commonly known as: FOLVITE Take 1 tablet (1 mg total) by mouth daily.   Pancrelipase (Lip-Prot-Amyl) 24000-76000 units Cpep Take 1 capsule (24,000 Units total) by mouth 3 (three) times daily before meals.   pantoprazole 40 MG tablet Commonly known as: PROTONIX Take 1 tablet (40 mg total) by mouth  daily.   thiamine 100 MG tablet Commonly known as: Vitamin B-1 Take 1 tablet (100 mg total) by mouth daily.        Follow-up Information     Margaretann Loveless, MD Follow up in 7 day(s).   Specialty: Internal Medicine Contact information: [address] [place] [address] [phone]                Discharge Exam: Ceasar Mons Weights   09/04/23 0540  Weight: 79.4 kg   Eyes: PERRL, lids and conjunctivae  normal ENMT: Mucous membranes are moist. Posterior pharynx clear of any exudate or lesions.Normal dentition.  Neck: normal, supple, no masses, no thyromegaly Respiratory: clear to auscultation bilaterally, no wheezing, no crackles. Normal respiratory effort. No accessory muscle use.  Cardiovascular: Regular rate and rhythm, no murmurs / rubs / gallops. No extremity edema. 2+ pedal pulses. No carotid bruits.  Abdomen: Tenderness on epigastric area, no rebound no guarding, no masses palpated. No hepatosplenomegaly. Bowel sounds positive.  Musculoskeletal: no clubbing / cyanosis. No joint deformity upper and lower extremities. Good ROM, no contractures. Normal muscle tone.  Skin: no rashes, lesions, ulcers. No induration Neurologic: CN 2-12 grossly intact. Sensation intact, DTR normal. Strength 5/5 in all 4.  Psychiatric: Normal judgment and insight. Alert and oriented x 3. Normal mood.     Condition at discharge: stable  The results of significant diagnostics from this hospitalization (including imaging, microbiology, ancillary and laboratory) are listed below for reference.   Imaging Studies: MR ABDOMEN MRCP W WO CONTAST Result Date: 09/06/2023 CLINICAL DATA:  63 year old male with history of acute pancreatitis. EXAM: MRI ABDOMEN WITHOUT AND WITH CONTRAST (INCLUDING MRCP) TECHNIQUE: Multiplanar multisequence MR imaging of the abdomen was performed both before and after the administration of intravenous contrast. Heavily T2-weighted images of the biliary and pancreatic ducts were obtained, and three-dimensional MRCP images were rendered by post processing. CONTRAST:  8mL GADAVIST GADOBUTROL 1 MMOL/ML IV SOLN COMPARISON:  Abdominal MRI 07/18/2022. FINDINGS: Lower chest: Large hiatal hernia. Hepatobiliary: Liver has a shrunken appearance and nodular contour, indicative of underlying cirrhosis. Mild diffuse loss of signal intensity throughout the hepatic parenchyma on out of phase dual echo images,  indicative of a background of hepatic steatosis. Multiple subcentimeter T1 hypointense, T2 hyperintense, nonenhancing liver lesions are compatible with tiny cysts and/or biliary hamartomas (no imaging follow-up recommended). No aggressive appearing hepatic lesions are noted. MRCP images demonstrate no significant intra or extrahepatic biliary ductal dilatation. Common bile duct measures 3 mm in the porta hepatis. No filling defect in the common bile duct to suggest choledocholithiasis. Gallbladder is moderately distended. No definite filling defects within the gallbladder to indicate gallstones. Gallbladder wall thickness is normal at 3 mm. Pericholecystic fluid is present, but nonspecific given the generalized small volume of ascites. Pancreas: Increased T2 signal intensity noted throughout the head and body of the pancreas, likely reflecting acute pancreatitis. Pancreatic parenchyma enhances normally. No discrete pancreatic mass confidently identified. No pancreatic ductal dilatation noted on MRCP images. Spleen:  Unremarkable. Adrenals/Urinary Tract: Multiple renal lesions are again noted ranging from T1 hypointense and T2 hyperintense without enhancement (Bosniak class 1 simple cysts) to T1 hyperintense, T2 hypointense without enhancement (Bosniak class 2 cysts), largest of which is exophytic extending off the upper pole of the left kidney measuring up to 2.1 cm in diameter. No imaging follow-up for any of these lesions is recommended. No aggressive appearing renal lesions are noted. No hydroureteronephrosis in the visualized portions of the abdomen. Bilateral adrenal glands are  normal in appearance. Stomach/Bowel: Visualized portions are unremarkable. Vascular/Lymphatic: No aneurysm identified in the visualized abdominal vasculature. Mildly enlarged gastrohepatic ligament lymph node measuring 1.2 cm in diameter (axial image 27 of series 24), nonspecific. Other:  Small volume of ascites. Musculoskeletal: No  aggressive appearing osseous lesions are noted in the visualized portions of the skeleton. IMPRESSION: 1. Findings are once again compatible with acute pancreatitis. No evidence of pancreatic necrosis or other acute complicating features at this time. 2. No choledocholithiasis or findings of biliary tract obstruction. 3. Hepatic cirrhosis with mild hepatic steatosis. 4. Small volume of ascites. 5. Large hiatal hernia. Electronically Signed   By: Trudie Reed M.D.   On: 09/06/2023 07:34   MR 3D Recon At Scanner Result Date: 09/06/2023 CLINICAL DATA:  63 year old male with history of acute pancreatitis. EXAM: MRI ABDOMEN WITHOUT AND WITH CONTRAST (INCLUDING MRCP) TECHNIQUE: Multiplanar multisequence MR imaging of the abdomen was performed both before and after the administration of intravenous contrast. Heavily T2-weighted images of the biliary and pancreatic ducts were obtained, and three-dimensional MRCP images were rendered by post processing. CONTRAST:  8mL GADAVIST GADOBUTROL 1 MMOL/ML IV SOLN COMPARISON:  Abdominal MRI 07/18/2022. FINDINGS: Lower chest: Large hiatal hernia. Hepatobiliary: Liver has a shrunken appearance and nodular contour, indicative of underlying cirrhosis. Mild diffuse loss of signal intensity throughout the hepatic parenchyma on out of phase dual echo images, indicative of a background of hepatic steatosis. Multiple subcentimeter T1 hypointense, T2 hyperintense, nonenhancing liver lesions are compatible with tiny cysts and/or biliary hamartomas (no imaging follow-up recommended). No aggressive appearing hepatic lesions are noted. MRCP images demonstrate no significant intra or extrahepatic biliary ductal dilatation. Common bile duct measures 3 mm in the porta hepatis. No filling defect in the common bile duct to suggest choledocholithiasis. Gallbladder is moderately distended. No definite filling defects within the gallbladder to indicate gallstones. Gallbladder wall thickness is  normal at 3 mm. Pericholecystic fluid is present, but nonspecific given the generalized small volume of ascites. Pancreas: Increased T2 signal intensity noted throughout the head and body of the pancreas, likely reflecting acute pancreatitis. Pancreatic parenchyma enhances normally. No discrete pancreatic mass confidently identified. No pancreatic ductal dilatation noted on MRCP images. Spleen:  Unremarkable. Adrenals/Urinary Tract: Multiple renal lesions are again noted ranging from T1 hypointense and T2 hyperintense without enhancement (Bosniak class 1 simple cysts) to T1 hyperintense, T2 hypointense without enhancement (Bosniak class 2 cysts), largest of which is exophytic extending off the upper pole of the left kidney measuring up to 2.1 cm in diameter. No imaging follow-up for any of these lesions is recommended. No aggressive appearing renal lesions are noted. No hydroureteronephrosis in the visualized portions of the abdomen. Bilateral adrenal glands are normal in appearance. Stomach/Bowel: Visualized portions are unremarkable. Vascular/Lymphatic: No aneurysm identified in the visualized abdominal vasculature. Mildly enlarged gastrohepatic ligament lymph node measuring 1.2 cm in diameter (axial image 27 of series 24), nonspecific. Other:  Small volume of ascites. Musculoskeletal: No aggressive appearing osseous lesions are noted in the visualized portions of the skeleton. IMPRESSION: 1. Findings are once again compatible with acute pancreatitis. No evidence of pancreatic necrosis or other acute complicating features at this time. 2. No choledocholithiasis or findings of biliary tract obstruction. 3. Hepatic cirrhosis with mild hepatic steatosis. 4. Small volume of ascites. 5. Large hiatal hernia. Electronically Signed   By: Trudie Reed M.D.   On: 09/06/2023 07:34   CT ABDOMEN PELVIS W CONTRAST Result Date: 09/04/2023 CLINICAL DATA:  Acute, severe pancreatitis EXAM: CT ABDOMEN  AND PELVIS WITH CONTRAST  TECHNIQUE: Multidetector CT imaging of the abdomen and pelvis was performed using the standard protocol following bolus administration of intravenous contrast. RADIATION DOSE REDUCTION: This exam was performed according to the departmental dose-optimization program which includes automated exposure control, adjustment of the mA and/or kV according to patient size and/or use of iterative reconstruction technique. CONTRAST:  OMNIPAQUE IOHEXOL 300 MG/ML  SOLN COMPARISON:  01/09/2023 FINDINGS: Lower chest: Moderate hiatal hernia which is sliding. Mild fat stranding in the lower mediastinum. Mild atelectasis at the lung bases. Hepatobiliary: Lobulated liver consistent with cirrhosis. No suspicious lesion when compared to prior.Full gallbladder without detected wall thickening. No biliary dilatation but there is a calcific density in the vicinity of the distal CBD, although more in line with the distal main pancreatic duct. Pancreas: Fat stranding surrounds the pancreas which is edematous and expanded. Main duct dilatation to 4 mm with distal calculus that is punctate. No collection or necrosis. Previously seen pseudocysts are resolved. Spleen: Unremarkable. Adrenals/Urinary Tract: Negative adrenals. No hydronephrosis or stone. 2 cm cyst at the upper pole left kidney. There is an isodense lesion exophytic from the left upper pole measuring 2.2 cm, Bosniak category 2 cyst by abdominal MRI in 2023 . Small diverticulum projecting lateral from the mildly thick walled urinary bladder, central low-density better seen on prior. Stomach/Bowel:  No obstruction. No visible bowel inflammation. Vascular/Lymphatic: No acute vascular abnormality. 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 Thickened appearance of lymph nodes in the deep liver distribution and peripancreatic region, likely reactive Reproductive:No pathologic findings. Other: No ascites or pneumoperitoneum. Musculoskeletal: No acute  abnormalities. IMPRESSION: 1. Acute edematous pancreatitis with punctate stone at the distal main pancreatic duct. No organized collection or necrosis. 2. Cirrhosis. 3. Moderate hiatal hernia. Electronically Signed   By: Tiburcio Pea M.D.   On: 09/04/2023 08:29   DG Chest 2 View Result Date: 09/04/2023 CLINICAL DATA:  Shortness of breath. EXAM: CHEST - 2 VIEW COMPARISON:  12/26/2022. FINDINGS: Bilateral lung fields are clear. Bilateral costophrenic angles are clear. Normal cardio-mediastinal silhouette. Small-to-moderate sized retrocardiac hiatal hernia noted. No acute osseous abnormalities. The soft tissues are within normal limits. IMPRESSION: No active cardiopulmonary disease. Electronically Signed   By: Jules Schick M.D.   On: 09/04/2023 07:48    Microbiology: Results for orders placed or performed during the hospital encounter of 01/09/23  C Difficile Quick Screen w PCR reflex     Status: None   Collection Time: 01/09/23  6:29 PM   Specimen: Urine, Clean Catch; Stool  Result Value Ref Range Status   C Diff antigen NEGATIVE NEGATIVE Final   C Diff toxin NEGATIVE NEGATIVE Final   C Diff interpretation No C. difficile detected.  Final    Comment: Performed at [facility], [address] [place], [place] [address]    Labs: CBC: Recent Labs  Lab 09/04/23 0541 09/05/23 0834  WBC 10.9* 12.3*  HGB 13.6 13.0  HCT 40.7 39.6  MCV 84.4 85.9  PLT 153 130*   Basic Metabolic Panel: Recent Labs  Lab 09/04/23 0541 09/05/23 0513 09/06/23 0956 09/07/23 0505  NA 140 138 135 134*  K 4.0 3.9 3.7 3.6  CL 104 105 102 103  CO2 24 26 26 24   GLUCOSE 139* 107* 81 104*  BUN 9 11 14 12   CREATININE 0.73 0.64 0.66 0.68  CALCIUM 9.1 8.6* 8.8* 8.4*  MG  --   --  1.9  --  Liver Function Tests: Recent Labs  Lab 09/04/23 0541 09/05/23 0513  AST 64* 46*  ALT 28 22  ALKPHOS 98 81  BILITOT 1.3* 1.7*  PROT 8.3* 7.2  ALBUMIN 3.9 3.1*   CBG: No results for input(s): "GLUCAP"  in the last 168 hours.  Discharge time spent: greater than 30 minutes.  Signed: Lucile Shutters, MD [facility] 09/07/2023

## 2023-09-09 ENCOUNTER — Other Ambulatory Visit: Payer: Self-pay | Admitting: Internal Medicine

## 2023-09-09 DIAGNOSIS — F411 Generalized anxiety disorder: Secondary | ICD-10-CM

## 2023-09-10 DIAGNOSIS — G608 Other hereditary and idiopathic neuropathies: Secondary | ICD-10-CM | POA: Diagnosis not present

## 2023-09-10 DIAGNOSIS — Z720 Tobacco use: Secondary | ICD-10-CM | POA: Diagnosis not present

## 2023-09-10 DIAGNOSIS — R200 Anesthesia of skin: Secondary | ICD-10-CM | POA: Diagnosis not present

## 2023-09-10 DIAGNOSIS — F1029 Alcohol dependence with unspecified alcohol-induced disorder: Secondary | ICD-10-CM | POA: Diagnosis not present

## 2023-09-10 DIAGNOSIS — M5416 Radiculopathy, lumbar region: Secondary | ICD-10-CM | POA: Diagnosis not present

## 2023-09-10 DIAGNOSIS — M7989 Other specified soft tissue disorders: Secondary | ICD-10-CM | POA: Diagnosis not present

## 2023-09-10 DIAGNOSIS — R269 Unspecified abnormalities of gait and mobility: Secondary | ICD-10-CM | POA: Diagnosis not present

## 2023-12-10 ENCOUNTER — Ambulatory Visit: Payer: Medicaid Other | Admitting: Internal Medicine

## 2023-12-11 ENCOUNTER — Ambulatory Visit: Admitting: Internal Medicine

## 2023-12-14 ENCOUNTER — Ambulatory Visit: Admitting: Internal Medicine

## 2024-01-01 DIAGNOSIS — R269 Unspecified abnormalities of gait and mobility: Secondary | ICD-10-CM | POA: Diagnosis not present

## 2024-01-01 DIAGNOSIS — G608 Other hereditary and idiopathic neuropathies: Secondary | ICD-10-CM | POA: Diagnosis not present

## 2024-01-01 DIAGNOSIS — M4727 Other spondylosis with radiculopathy, lumbosacral region: Secondary | ICD-10-CM | POA: Diagnosis not present

## 2024-01-01 DIAGNOSIS — M4807 Spinal stenosis, lumbosacral region: Secondary | ICD-10-CM | POA: Diagnosis not present

## 2024-01-01 DIAGNOSIS — M48061 Spinal stenosis, lumbar region without neurogenic claudication: Secondary | ICD-10-CM | POA: Diagnosis not present

## 2024-01-01 DIAGNOSIS — M5116 Intervertebral disc disorders with radiculopathy, lumbar region: Secondary | ICD-10-CM | POA: Diagnosis not present

## 2024-01-01 DIAGNOSIS — M4726 Other spondylosis with radiculopathy, lumbar region: Secondary | ICD-10-CM | POA: Diagnosis not present

## 2024-01-22 DIAGNOSIS — G608 Other hereditary and idiopathic neuropathies: Secondary | ICD-10-CM | POA: Diagnosis not present

## 2024-01-22 DIAGNOSIS — R200 Anesthesia of skin: Secondary | ICD-10-CM | POA: Diagnosis not present

## 2024-01-22 DIAGNOSIS — R269 Unspecified abnormalities of gait and mobility: Secondary | ICD-10-CM | POA: Diagnosis not present

## 2024-01-22 DIAGNOSIS — M7989 Other specified soft tissue disorders: Secondary | ICD-10-CM | POA: Diagnosis not present

## 2024-01-22 DIAGNOSIS — M51369 Other intervertebral disc degeneration, lumbar region without mention of lumbar back pain or lower extremity pain: Secondary | ICD-10-CM | POA: Diagnosis not present

## 2024-01-23 IMAGING — CR DG CERVICAL SPINE 2 OR 3 VIEWS
4 series · 4 of 4 positions shown · non-contrast
Comparison: None Available.

CLINICAL DATA: Neck pain and stiffness.

EXAM:
CERVICAL SPINE - 2-3 VIEW

[c-spine lat]
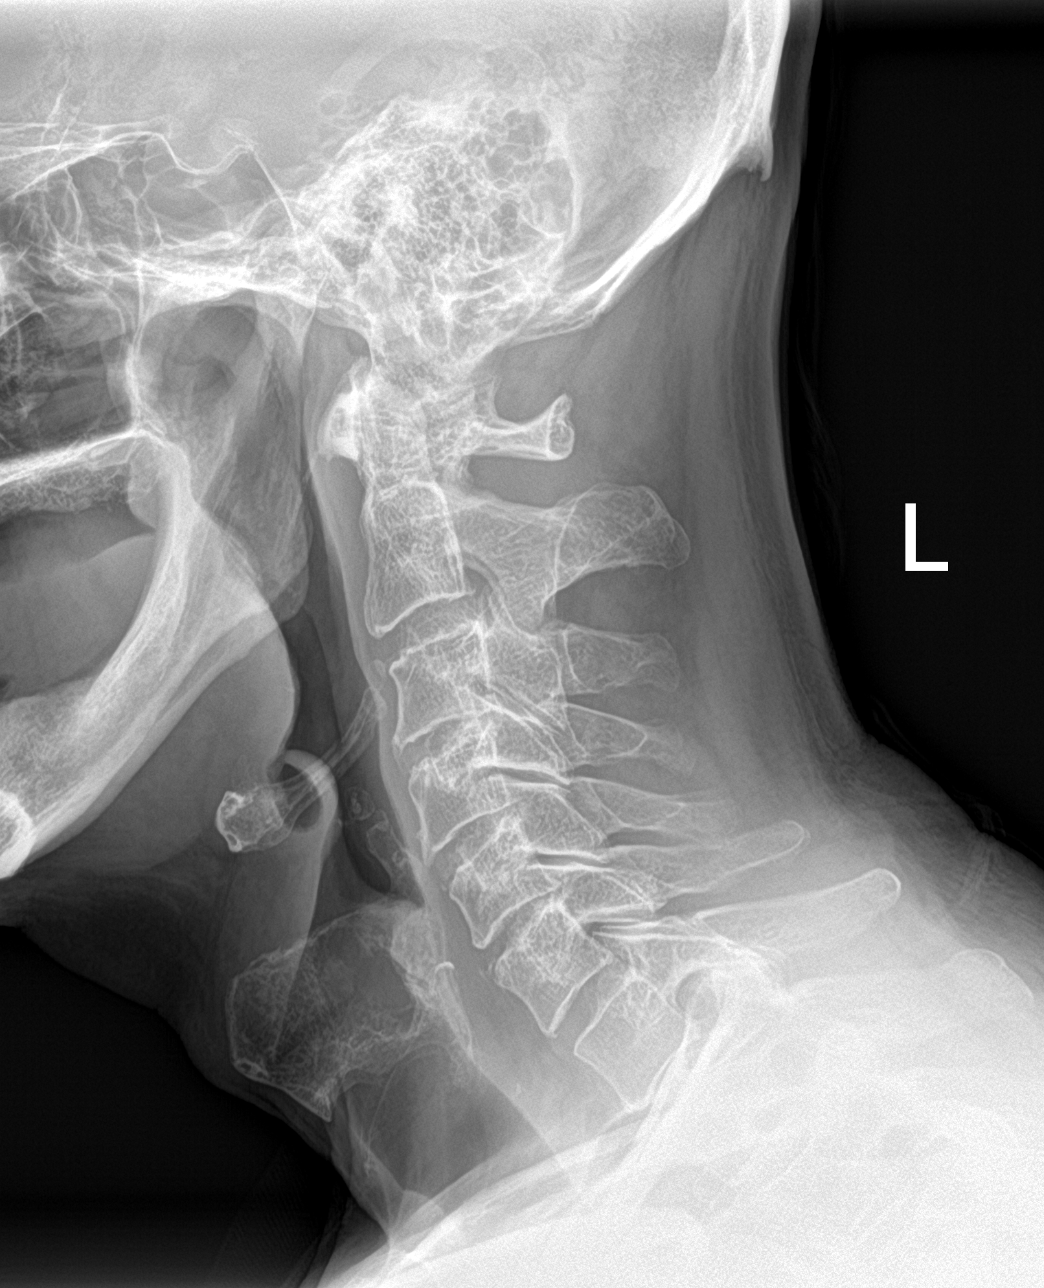

[c-spine ap]
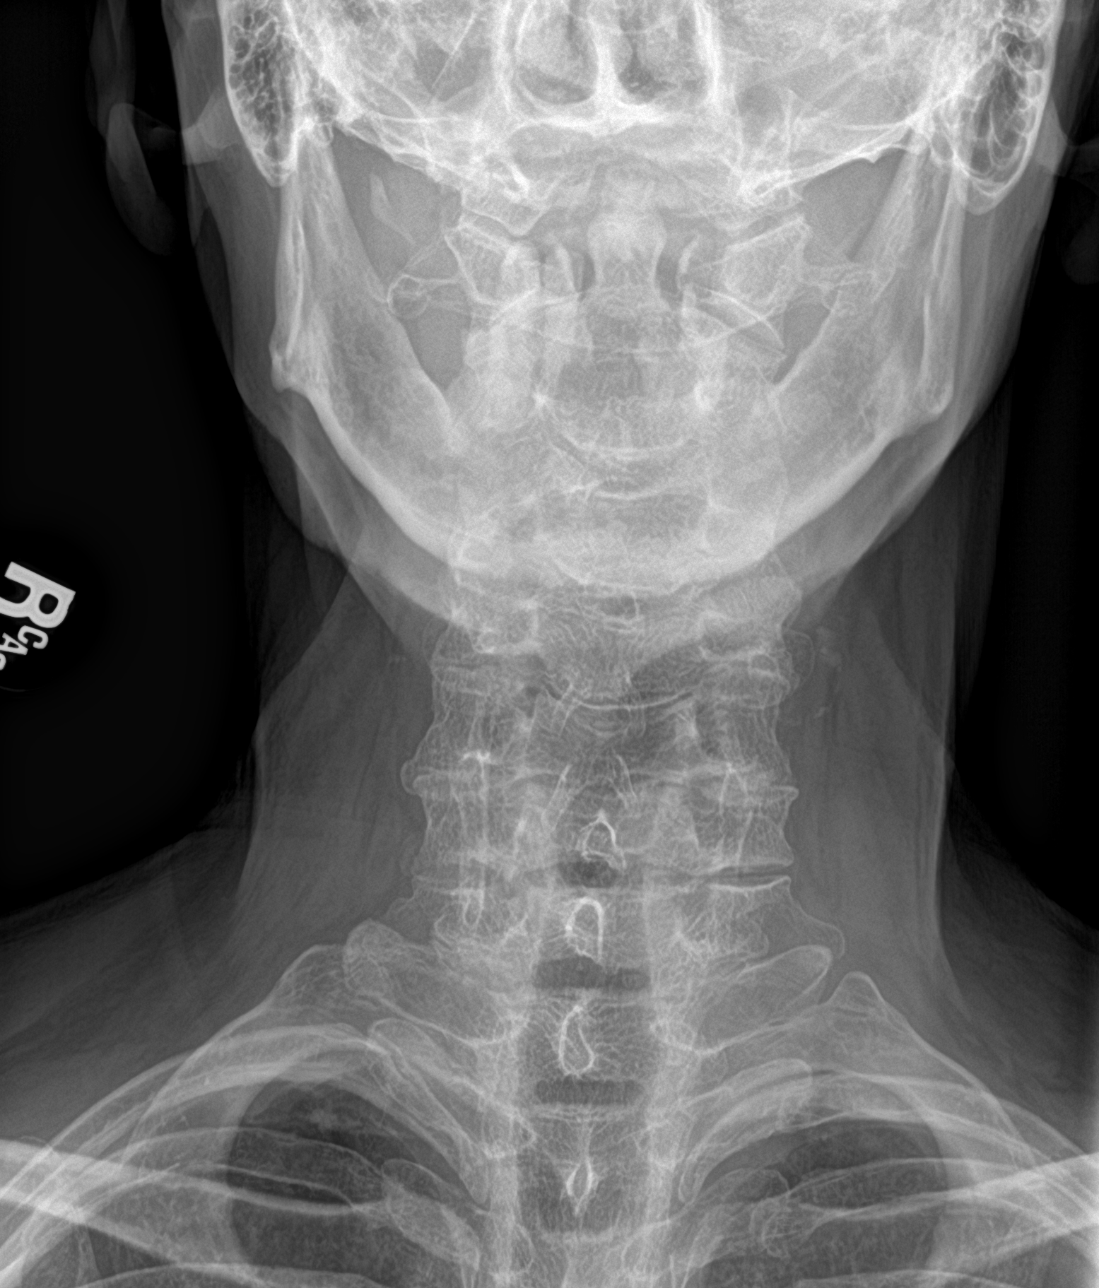

[c-spine open mouth]
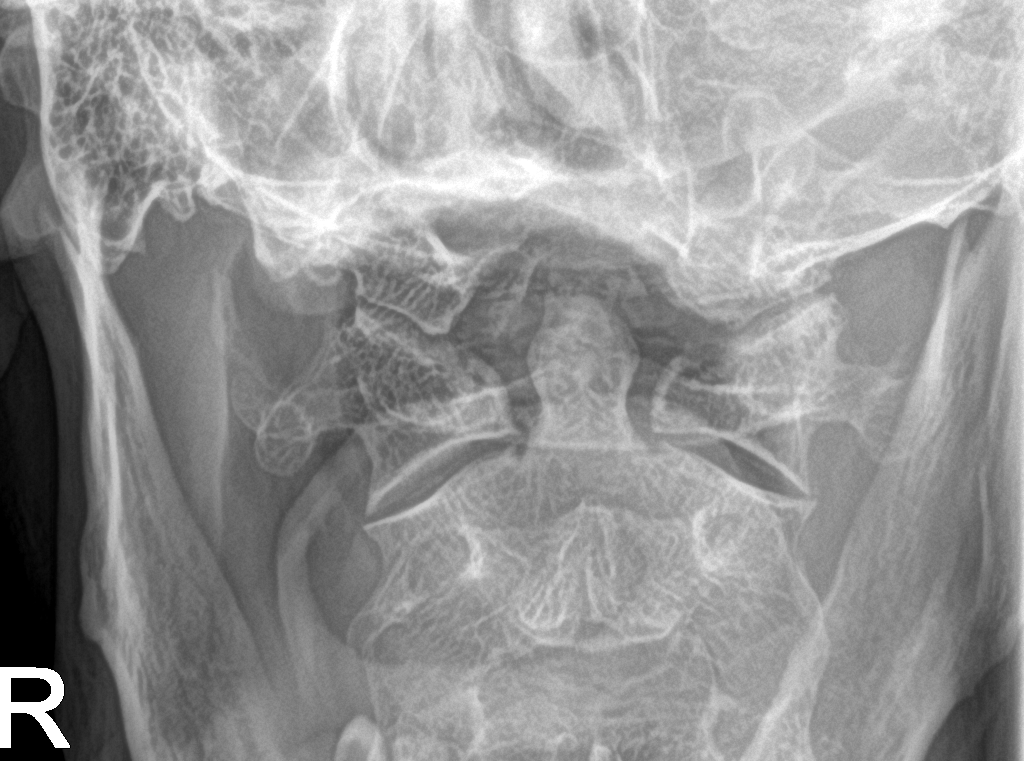

[c-spine swimmers]
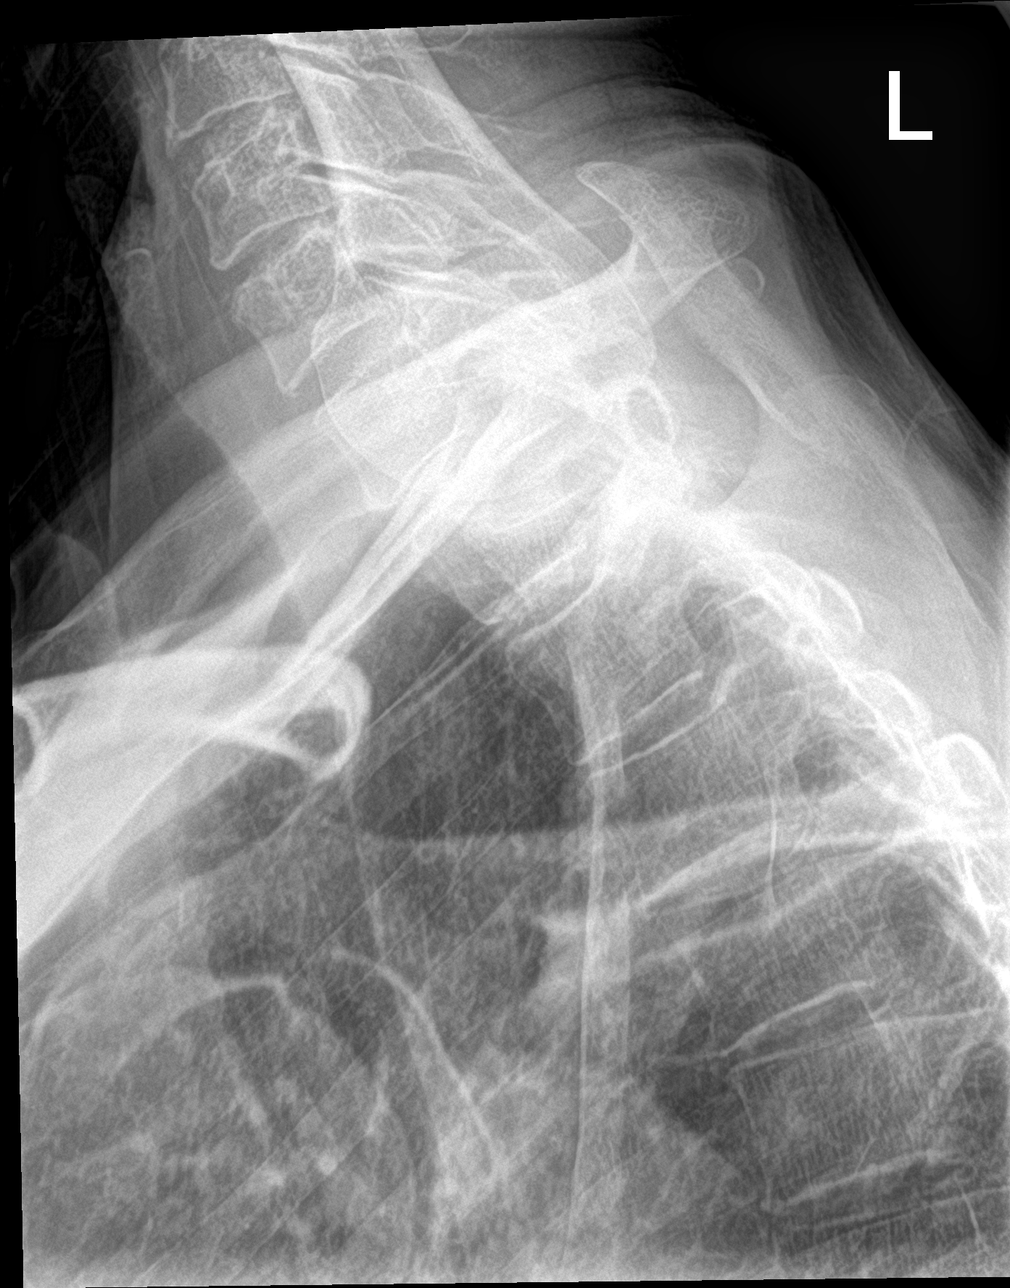

[4 of 4 positions shown; findings below may reference images not displayed]

FINDINGS: No evidence for an acute fracture or subluxation. Loss of disc
height noted C5-6. No prevertebral soft tissue swelling.
IMPRESSION: Degenerative disc disease at C5-6.  Otherwise unremarkable.

## 2024-02-14 ENCOUNTER — Telehealth: Payer: Self-pay

## 2024-02-14 DIAGNOSIS — F418 Other specified anxiety disorders: Secondary | ICD-10-CM

## 2024-02-14 NOTE — Progress Notes (Signed)
 Complex Care Management Note  Care Guide Note 02/14/2024 Name: Luke Rogers. MRN: [id number] DOB: 09/03/1960  Luke Rogers. is a 63 y.o. year old male who sees Fernand Fredy RAMAN, MD for primary care. I reached out to [employer]. by phone today to offer complex care management services.  Luke Rogers was given information about Complex Care Management services today including:   The Complex Care Management services include support from the care team which includes your Nurse Care Manager, Clinical Social Worker, or Pharmacist.  The Complex Care Management team is here to help remove barriers to the health concerns and goals most important to you. Complex Care Management services are voluntary, and the patient may decline or stop services at any time by request to their care team member.   Complex Care Management Consent Status: Patient did not agree to participate in complex care management services at this time.  Follow up plan:    Encounter Outcome:  Does not have [place] PCP  Jeoffrey Buffalo , RMA     [place]  [facility], [facility] Guide  Direct Dial: [phone]  Website: [email or web address]

## 2024-03-22 DIAGNOSIS — F99 Mental disorder, not otherwise specified: Secondary | ICD-10-CM | POA: Diagnosis not present

## 2024-03-24 DIAGNOSIS — F1020 Alcohol dependence, uncomplicated: Secondary | ICD-10-CM | POA: Diagnosis not present

## 2024-03-24 DIAGNOSIS — F99 Mental disorder, not otherwise specified: Secondary | ICD-10-CM | POA: Diagnosis not present

## 2024-04-22 DIAGNOSIS — F99 Mental disorder, not otherwise specified: Secondary | ICD-10-CM | POA: Diagnosis not present

## 2024-07-20 ENCOUNTER — Other Ambulatory Visit: Payer: Self-pay | Admitting: Internal Medicine

## 2024-07-20 DIAGNOSIS — F1011 Alcohol abuse, in remission: Secondary | ICD-10-CM

## 2024-08-05 ENCOUNTER — Encounter: Payer: Self-pay | Admitting: Emergency Medicine

## 2024-08-05 ENCOUNTER — Other Ambulatory Visit: Payer: Self-pay

## 2024-08-05 ENCOUNTER — Emergency Department: Payer: MEDICAID

## 2024-08-05 ENCOUNTER — Inpatient Hospital Stay
Admission: EM | Admit: 2024-08-05 | Discharge: 2024-08-07 | DRG: 440 | Disposition: A | Discharge status: Home / Self Care | Payer: MEDICAID | Attending: Internal Medicine | Admitting: Internal Medicine

## 2024-08-05 DIAGNOSIS — K852 Alcohol induced acute pancreatitis without necrosis or infection: Secondary | ICD-10-CM | POA: Diagnosis not present

## 2024-08-05 DIAGNOSIS — K219 Gastro-esophageal reflux disease without esophagitis: Secondary | ICD-10-CM | POA: Diagnosis present

## 2024-08-05 DIAGNOSIS — K859 Acute pancreatitis without necrosis or infection, unspecified: Principal | ICD-10-CM | POA: Diagnosis present

## 2024-08-05 DIAGNOSIS — G8929 Other chronic pain: Secondary | ICD-10-CM | POA: Diagnosis present

## 2024-08-05 DIAGNOSIS — Z8249 Family history of ischemic heart disease and other diseases of the circulatory system: Secondary | ICD-10-CM

## 2024-08-05 DIAGNOSIS — K860 Alcohol-induced chronic pancreatitis: Secondary | ICD-10-CM

## 2024-08-05 DIAGNOSIS — Z87891 Personal history of nicotine dependence: Secondary | ICD-10-CM

## 2024-08-05 DIAGNOSIS — Z8661 Personal history of infections of the central nervous system: Secondary | ICD-10-CM

## 2024-08-05 DIAGNOSIS — E876 Hypokalemia: Secondary | ICD-10-CM | POA: Diagnosis present

## 2024-08-05 DIAGNOSIS — F1010 Alcohol abuse, uncomplicated: Secondary | ICD-10-CM | POA: Diagnosis present

## 2024-08-05 DIAGNOSIS — F1011 Alcohol abuse, in remission: Secondary | ICD-10-CM

## 2024-08-05 LAB — COMPREHENSIVE METABOLIC PANEL WITH GFR
ALT: 34 U/L (ref 0–44)
AST: 130 U/L — ABNORMAL HIGH (ref 15–41)
Albumin: 4 g/dL (ref 3.5–5.0)
Alkaline Phosphatase: 108 U/L (ref 38–126)
Anion gap: 11 (ref 5–15)
BUN: <5 mg/dL — ABNORMAL LOW (ref 8–23)
CO2: 30 mmol/L (ref 22–32)
Calcium: 9.3 mg/dL (ref 8.9–10.3)
Chloride: 96 mmol/L — ABNORMAL LOW (ref 98–111)
Creatinine, Ser: 0.74 mg/dL (ref 0.61–1.24)
GFR, Estimated: >60 mL/min
Glucose, Bld: 160 mg/dL — ABNORMAL HIGH (ref 70–99)
Potassium: 3.1 mmol/L — ABNORMAL LOW (ref 3.5–5.1)
Sodium: 136 mmol/L (ref 135–145)
Total Bilirubin: 2.1 mg/dL — ABNORMAL HIGH (ref 0.0–1.2)
Total Protein: 8.1 g/dL (ref 6.5–8.1)

## 2024-08-05 LAB — CBC
HCT: 42 % (ref 39.0–52.0)
Hemoglobin: 14.9 g/dL (ref 13.0–17.0)
MCH: 33.8 pg (ref 26.0–34.0)
MCHC: 35.5 g/dL (ref 30.0–36.0)
MCV: 95.2 fL (ref 80.0–100.0)
Platelets: 109 K/uL — ABNORMAL LOW (ref 150–400)
RBC: 4.41 MIL/uL (ref 4.22–5.81)
RDW: 12.8 % (ref 11.5–15.5)
WBC: 6.2 K/uL (ref 4.0–10.5)
nRBC: 0 % (ref 0.0–0.2)

## 2024-08-05 LAB — LIPASE, BLOOD: Lipase: 142 U/L — ABNORMAL HIGH (ref 11–51)

## 2024-08-05 LAB — MAGNESIUM: Magnesium: 1.5 mg/dL — ABNORMAL LOW (ref 1.7–2.4)

## 2024-08-05 LAB — PHOSPHORUS: Phosphorus: 2.2 mg/dL — ABNORMAL LOW (ref 2.5–4.6)

## 2024-08-05 MED ORDER — TRAZODONE HCL 50 MG PO TABS: 25.0000 mg | ORAL_TABLET | Freq: Every evening | ORAL | Status: DC | PRN

## 2024-08-05 MED ORDER — BISACODYL 5 MG PO TBEC: 5.0000 mg | DELAYED_RELEASE_TABLET | Freq: Every day | ORAL | Status: DC | PRN

## 2024-08-05 MED ORDER — THIAMINE HCL 100 MG/ML IJ SOLN: 100.0000 mg | Freq: Every day | INTRAMUSCULAR | Status: DC

## 2024-08-05 MED ORDER — ACETAMINOPHEN 650 MG RE SUPP: 650.0000 mg | Freq: Four times a day (QID) | RECTAL | Status: DC | PRN

## 2024-08-05 MED ORDER — THIAMINE MONONITRATE 100 MG PO TABS
100.0000 mg | ORAL_TABLET | Freq: Every day | ORAL | Status: DC
  Administered 2024-08-05 – 2024-08-07 (×3): 100 mg via ORAL
  Filled 2024-08-05 (×3): qty 1

## 2024-08-05 MED ORDER — PHENOBARBITAL SODIUM 130 MG/ML IJ SOLN: 130.0000 mg | Freq: Four times a day (QID) | INTRAMUSCULAR | Status: DC | PRN

## 2024-08-05 MED ORDER — POTASSIUM CHLORIDE 20 MEQ PO PACK
40.0000 meq | PACK | Freq: Every day | ORAL | Status: DC
  Administered 2024-08-05 – 2024-08-07 (×3): 40 meq via ORAL
  Filled 2024-08-05 (×3): qty 2

## 2024-08-05 MED ORDER — KETOROLAC TROMETHAMINE 15 MG/ML IJ SOLN
15.0000 mg | Freq: Four times a day (QID) | INTRAMUSCULAR | Status: DC | PRN
  Administered 2024-08-06 – 2024-08-07 (×2): 15 mg via INTRAVENOUS
  Filled 2024-08-05 (×2): qty 1

## 2024-08-05 MED ORDER — ADULT MULTIVITAMIN W/MINERALS CH
1.0000 | ORAL_TABLET | Freq: Every day | ORAL | Status: DC
  Administered 2024-08-05 – 2024-08-07 (×3): 1 via ORAL
  Filled 2024-08-05 (×3): qty 1

## 2024-08-05 MED ORDER — ONDANSETRON HCL 4 MG/2ML IJ SOLN: 4.0000 mg | Freq: Four times a day (QID) | INTRAMUSCULAR | Status: DC | PRN

## 2024-08-05 MED ORDER — SODIUM CHLORIDE 0.9 % IV BOLUS
1000.0000 mL | Freq: Once | INTRAVENOUS | Status: AC
  Administered 2024-08-05: 1000 mL via INTRAVENOUS

## 2024-08-05 MED ORDER — MORPHINE SULFATE (PF) 4 MG/ML IV SOLN
4.0000 mg | Freq: Once | INTRAVENOUS | Status: AC
  Administered 2024-08-05: 4 mg via INTRAVENOUS
  Filled 2024-08-05: qty 1

## 2024-08-05 MED ORDER — ONDANSETRON HCL 4 MG/2ML IJ SOLN
4.0000 mg | INTRAMUSCULAR | Status: AC
  Administered 2024-08-05: 4 mg via INTRAVENOUS
  Filled 2024-08-05: qty 2

## 2024-08-05 MED ORDER — HYDROMORPHONE HCL 1 MG/ML IJ SOLN
0.5000 mg | INTRAMUSCULAR | Status: DC | PRN
  Administered 2024-08-05 – 2024-08-07 (×8): 0.5 mg via INTRAVENOUS
  Filled 2024-08-05 (×8): qty 0.5

## 2024-08-05 MED ORDER — MAGNESIUM SULFATE IN D5W 1-5 GM/100ML-% IV SOLN
1.0000 g | Freq: Once | INTRAVENOUS | Status: AC
  Administered 2024-08-05: 1 g via INTRAVENOUS
  Filled 2024-08-05: qty 100

## 2024-08-05 MED ORDER — ONDANSETRON HCL 4 MG PO TABS: 4.0000 mg | ORAL_TABLET | Freq: Four times a day (QID) | ORAL | Status: DC | PRN

## 2024-08-05 MED ORDER — IOHEXOL 300 MG/ML  SOLN
100.0000 mL | Freq: Once | INTRAMUSCULAR | Status: AC | PRN
  Administered 2024-08-05: 100 mL via INTRAVENOUS

## 2024-08-05 MED ORDER — LORAZEPAM 1 MG PO TABS: 1.0000 mg | ORAL_TABLET | ORAL | Status: DC | PRN

## 2024-08-05 MED ORDER — SENNOSIDES-DOCUSATE SODIUM 8.6-50 MG PO TABS: 1.0000 | ORAL_TABLET | Freq: Every evening | ORAL | Status: DC | PRN

## 2024-08-05 MED ORDER — ACETAMINOPHEN 325 MG PO TABS: 650.0000 mg | ORAL_TABLET | Freq: Four times a day (QID) | ORAL | Status: DC | PRN

## 2024-08-05 MED ORDER — POTASSIUM CHLORIDE 10 MEQ/100ML IV SOLN
10.0000 meq | Freq: Once | INTRAVENOUS | Status: AC
  Administered 2024-08-05: 10 meq via INTRAVENOUS
  Filled 2024-08-05: qty 100

## 2024-08-05 MED ORDER — PANCRELIPASE (LIP-PROT-AMYL) 12000-38000 UNITS PO CPEP
24000.0000 [IU] | ORAL_CAPSULE | Freq: Three times a day (TID) | ORAL | Status: DC
  Administered 2024-08-05 – 2024-08-07 (×6): 24000 [IU] via ORAL
  Filled 2024-08-05 (×6): qty 2

## 2024-08-05 MED ORDER — PANTOPRAZOLE SODIUM 40 MG PO TBEC
40.0000 mg | DELAYED_RELEASE_TABLET | Freq: Every day | ORAL | Status: DC
  Administered 2024-08-05 – 2024-08-07 (×3): 40 mg via ORAL
  Filled 2024-08-05 (×3): qty 1

## 2024-08-05 MED ORDER — MORPHINE SULFATE (PF) 4 MG/ML IV SOLN
6.0000 mg | Freq: Once | INTRAVENOUS | Status: AC
  Administered 2024-08-05: 6 mg via INTRAVENOUS
  Filled 2024-08-05: qty 2

## 2024-08-05 MED ORDER — LORAZEPAM 2 MG/ML IJ SOLN
1.0000 mg | INTRAMUSCULAR | Status: DC | PRN
  Administered 2024-08-05: 2 mg via INTRAVENOUS
  Administered 2024-08-05: 3 mg via INTRAVENOUS
  Administered 2024-08-05 (×3): 2 mg via INTRAVENOUS
  Administered 2024-08-06: 4 mg via INTRAVENOUS
  Filled 2024-08-05: qty 2
  Filled 2024-08-05 (×2): qty 1
  Filled 2024-08-05: qty 2
  Filled 2024-08-05 (×2): qty 1

## 2024-08-05 MED ORDER — LACTATED RINGERS IV BOLUS
500.0000 mL | Freq: Once | INTRAVENOUS | Status: AC
  Administered 2024-08-05: 500 mL via INTRAVENOUS

## 2024-08-05 MED ORDER — FOLIC ACID 1 MG PO TABS
1.0000 mg | ORAL_TABLET | Freq: Every day | ORAL | Status: DC
  Administered 2024-08-05 – 2024-08-07 (×3): 1 mg via ORAL
  Filled 2024-08-05 (×3): qty 1

## 2024-08-05 NOTE — ED Triage Notes (Addendum)
 PT reports vomiting, diarrhea, and abdominal pain x 1 month. Pt has history of pancreatitis. Pt reports last drink this morning. PT reports drinking 6-8 beers and mixed drinks per day.

## 2024-08-05 NOTE — ED Provider Notes (Signed)
 "  [facility] Provider Note    Event Date/Time   First MD Initiated Contact with Patient 08/05/24 [phone]     (approximate)   History   Emesis   HPI  Luke Rogers. is a 64 y.o. male   reviewed his discharge summary from February 14 of last year.  Patient had severe acute pancreatitis polysubstance abuse and alcohol withdrawal  Fairly abrupt severe upper abdominal pain.  Patient reports feel like previous problem of the pancreas.  He also started having the shakes this morning.  He reports he gets the shakes in every wakes up usually drinks upwards of 8 alcoholic drinks per day as well as hard liquor on top of that.  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   Hyperbilirubinemia 12/18/2021   Hypomagnesemia 12/22/2021   Hyponatremia 12/18/2021   Mass of soft tissue of left lower extremity 10/03/2022   Metabolic acidosis 12/18/2021   Pancreatitis    PEG (percutaneous endoscopic gastrostomy) adjustment/replacement/removal (HCC) 01/16/2023   Status post insertion of percutaneous endoscopic gastrostomy (PEG) tube (HCC) 01/09/2023   Transaminitis 08/24/2014   Tremor 08/24/2014   Visual hallucination    Visual hallucinations 12/18/2021   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     Physical Exam   Triage Vital Signs: ED Triage Vitals  Encounter Vitals Group     BP 08/05/24 0834 128/89     Girls Systolic BP Percentile --      Girls Diastolic BP Percentile --      Boys Systolic BP Percentile --       Boys Diastolic BP Percentile --      Pulse Rate 08/05/24 0834 96     Resp 08/05/24 0834 20     Temp 08/05/24 0834 97.7 F (36.5 C)     Temp Source 08/05/24 0834 Oral     SpO2 08/05/24 0834 98 %     Weight --      Height --      Head Circumference --      Peak Flow --      Pain Score 08/05/24 0835 7     Pain Loc --      Pain Education --      Exclude from Growth Chart --     Most recent vital signs: Vitals:   08/05/24 0834 08/05/24 1300  BP: 128/89 111/76  Pulse: 96 85  Resp: 20 17  Temp: 97.7 F (36.5 C) 98 F (36.7 C)  SpO2: 98% 99%     General: Awake, no distress.  Sitting up in triage room CV:   Good peripheral perfusion. Normal rate and heart tones. Resp:   Normal effort.  Speaking without distress. Abd:  Abdomen slightly protuberant.  He has significant pain to palpation in the epigastric space.  He has a soft which appears to be of small ventral hernia, but does not seem to be an area of focus of pain there is no overlying skin changes.  Reports the pain to be  more epigastric Neuro:   No focal neuro deficits noted. Moves extremities well without noted concern. Other:  Patient has shakes and tremulousness in his hands.  Patient reports wakes up in the morning with these almost daily.   ED Results / Procedures / Treatments   Labs (all labs ordered are listed, but only abnormal results are displayed) Labs Reviewed  CBC - Abnormal; Notable for the following components:      Result Value   Platelets 109 (*)    All other components within normal limits  COMPREHENSIVE METABOLIC PANEL WITH GFR - Abnormal; Notable for the following components:   Potassium 3.1 (*)    Chloride 96 (*)    Glucose, Bld 160 (*)    BUN <5 (*)    AST 130 (*)    Total Bilirubin 2.1 (*)    All other components within normal limits  LIPASE, BLOOD - Abnormal; Notable for the following components:   Lipase 142 (*)    All other components within normal limits  MAGNESIUM  - Abnormal; Notable  for the following components:   Magnesium  1.5 (*)    All other components within normal limits  PHOSPHORUS - Abnormal; Notable for the following components:   Phosphorus 2.2 (*)    All other components within normal limits     EKG  I independently reviewed the EKG at 1150 heart rate 80 QRS 100 QTc 490 Normal sinus rhythm no evidence of ischemia.  RADIOLOGY I independently reviewed images of CT imaging, appearslooks like chronic liver disease.  I do not see any frank acute intra-abdominal finding   CT ABDOMEN PELVIS W CONTRAST Result Date: 08/05/2024 CLINICAL DATA:  Epigastric abdominal pain for 1 month. EXAM: CT ABDOMEN AND PELVIS WITH CONTRAST TECHNIQUE: Multidetector CT imaging of the abdomen and pelvis was performed using the standard protocol following bolus administration of intravenous contrast. RADIATION DOSE REDUCTION: This exam was performed according to the departmental dose-optimization program which includes automated exposure control, adjustment of the mA and/or kV according to patient size and/or use of iterative reconstruction technique. CONTRAST:  OMNIPAQUE  IOHEXOL  300 MG/ML  SOLN COMPARISON:  September 04, 2023 FINDINGS: Lower chest: Moderate size hiatal hernia is noted. Visualized lung bases are unremarkable. Hepatobiliary: Hepatic steatosis. Hepatic cirrhosis. No biliary dilatation. Mildly distended gallbladder without cholelithiasis. Pancreas: Unremarkable. No pancreatic ductal dilatation or surrounding inflammatory changes. Spleen: Normal in size without focal abnormality. Adrenals/Urinary Tract: Adrenal glands appear normal. Left renal cyst is noted. No hydronephrosis or renal obstruction is noted. Urinary bladder is unremarkable. Stomach/Bowel: Stomach is within normal limits. Appendix appears normal. No evidence of bowel wall thickening, distention, or inflammatory changes. Vascular/Lymphatic: No significant vascular abnormality is noted. Stable mildly enlarged lymph  nodes are noted in porta hepatis region most likely related to hepatic disease. Reproductive: Prostate is unremarkable. Other: No abdominal wall hernia or abnormality. No abdominopelvic ascites. Musculoskeletal: No acute or significant osseous findings. IMPRESSION: 1. Moderate size hiatal hernia. 2. Hepatic steatosis. Hepatic cirrhosis. 3. Stable mildly enlarged lymph nodes are noted in porta hepatis region most likely related to hepatic disease. Electronically Signed   By: Lynwood Landy Raddle M.D.   On: 08/05/2024 10:37      PROCEDURES:  Critical Care performed: No  Procedures   MEDICATIONS ORDERED IN ED: Medications  LORazepam  (ATIVAN ) tablet 1-4 mg ( Oral See Alternative 08/05/24 1405)    Or  LORazepam  (ATIVAN ) injection 1-4 mg (2 mg Intravenous Given 08/05/24 1405)  thiamine  (VITAMIN B1) tablet 100 mg (100 mg  Oral Given 08/05/24 0915)    Or  thiamine  (VITAMIN B1) injection 100 mg ( Intravenous See Alternative 08/05/24 0915)  folic acid  (FOLVITE ) tablet 1 mg (1 mg Oral Given 08/05/24 0907)  multivitamin with minerals tablet 1 tablet (1 tablet Oral Given 08/05/24 0915)  potassium chloride  (KLOR-CON ) packet 40 mEq (40 mEq Oral Given 08/05/24 1355)  lipase/protease/amylase (CREON ) capsule 24,000 Units (has no administration in time range)  pantoprazole  (PROTONIX ) EC tablet 40 mg (40 mg Oral Given 08/05/24 1355)  acetaminophen  (TYLENOL ) tablet 650 mg (has no administration in time range)    Or  acetaminophen  (TYLENOL ) suppository 650 mg (has no administration in time range)  ketorolac  (TORADOL ) 15 MG/ML injection 15 mg (has no administration in time range)  HYDROmorphone  (DILAUDID ) injection 0.5 mg (has no administration in time range)  senna-docusate (Senokot-S) tablet 1 tablet (has no administration in time range)  bisacodyl  (DULCOLAX) EC tablet 5 mg (has no administration in time range)  traZODone  (DESYREL ) tablet 25 mg (has no administration in time range)  ondansetron  (ZOFRAN ) tablet 4 mg  (has no administration in time range)    Or  ondansetron  (ZOFRAN ) injection 4 mg (has no administration in time range)  lactated ringers  bolus 500 mL (0 mLs Intravenous Stopped 08/05/24 1007)  ondansetron  (ZOFRAN ) injection 4 mg (4 mg Intravenous Given 08/05/24 0907)  morphine  (PF) 4 MG/ML injection 6 mg (6 mg Intravenous Given 08/05/24 0907)  iohexol  (OMNIPAQUE ) 300 MG/ML solution 100 mL (100 mLs Intravenous Contrast Given 08/05/24 1015)  morphine  (PF) 4 MG/ML injection 4 mg (4 mg Intravenous Given 08/05/24 1355)  sodium chloride  0.9 % bolus 1,000 mL (1,000 mLs Intravenous New Bag/Given 08/05/24 1356)  potassium chloride  10 mEq in 100 mL IVPB (10 mEq Intravenous New Bag/Given 08/05/24 1357)  magnesium  sulfate IVPB 1 g 100 mL (1 g Intravenous New Bag/Given 08/05/24 1358)     IMPRESSION / MDM / ASSESSMENT AND PLAN / ED COURSE  I reviewed the triage vital signs and the nursing notes.                              Based on presentation, the differential diagnosis includes, but is not limited to key considerations: abdominal pain DIFFERENTIAL DIAGNOSIS CONSIDERATIONS: High-acuity etiologies considered and prioritized for rule-out: - Abdominal Aortic Aneurysm (AAA) Rupture/Leak - Mesenteric Ischemia - Perforated Viscus (Hollow Organ Perforation) - Acute Appendicitis - Testicular Torsion (if male) - Bowel Obstruction (Strangulated) - Ascending Cholangitis - Acute Pancreatitis - Cholecystitis - Diverticulitis   Patient's presentation is most consistent with acute complicated illness / injury requiring diagnostic workup.  High concern for recurrent pancreatitis he also has shakes and tremulousness.  Vital signs are reassuring.  Suspect early alcohol withdrawal, reports what sounds like he has withdrawal-like symptoms almost every morning.  Very complicated history of severe withdrawal in the past.  Will start on withdrawal protocol, Ativan .  Additionally IV fluids pain medication will proceed with  imaging CT etc.   On reassessment patient reports pain escalating after attempting oral diet.  He does appear quite uncomfortable.  Suspect patient likely suffering recurrent pancreatitis based on clinical history also alcohol withdrawal.  Patient admitted to hospitalist service under care of Dr. Laurita  FINAL CLINICAL IMPRESSION(S) / ED DIAGNOSES   Final diagnoses:  Alcohol-induced chronic pancreatitis (HCC)  Gastroesophageal reflux disease without esophagitis     Rx / DC Orders   ED Discharge Orders     None  Note:  This document was prepared using Dragon voice recognition software and may include unintentional dictation errors.   Dicky Anes, MD 08/05/24 1550  "

## 2024-08-05 NOTE — ED Provider Notes (Signed)
 Clinical Course as of 08/05/24 1552  Tue Aug 05, 2024  1253 Patient trialed juice and crackers, pain recurring once again in the epigastrium.  Moderate to severe.  No active emesis but being anything other than p.o.'s triggering and worsening pain.  Given additional morphine  replete electrolytes.  He also has mild shakes and tremor once again returning.  Plan to admit to hospitalist service for treatment of presumed recurrent pancreatitis, alcohol withdrawal, electrolyte disturbances, prolonged QT [MQ]    Clinical Course User Index [MQ] Dicky Anes, MD      Dicky Anes, MD 08/05/24 [phone]

## 2024-08-05 NOTE — H&P (Signed)
 " History and Physical    Luke Rogers. FMW:[id number] DOB: 29-Oct-1960 DOA: 08/05/2024  PCP: Fernand Fredy RAMAN, MD (Confirm with patient/family/NH records and if not entered, this has to be entered at [facility] point of entry) Patient coming from: Home  I have personally briefly reviewed patient's old medical records in [facility]  Chief Complaint: Belly hurts, nauseous vomiting diarrhea  HPI: Luke Rogers. is a 64 y.o. male with medical history significant of alcohol use, recurrent alcoholic pancreatitis, chronic pancreatic insufficiency, hepatitis C, presented with worsening of GI symptoms including abdominal pain, nauseous vomiting diarrhea.  Patient has been doing binge drinking on and off for last month.  Last drink this morning.  Intermittently has been having worsening of epigastric pain radiating to the back, last few days he has been having frequent nauseous vomiting with stomach content nonbloody nonbilious and loose diarrhea but denied any tenesmus no fever or chills. ED Course: Afebrile, not tachycardia no hypotension not hypoxic.  CT abdomen pelvis with no acute findings, blood work showed magnesium  1.5 K3.2 BUN 5 creatinine 0.7 lipase 142  Patient was given morphine  x 2 Zofran  in the ED.  Review of Systems: As per HPI otherwise 14 point review of systems negative.    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   Hyperbilirubinemia 12/18/2021   Hypomagnesemia 12/22/2021   Hyponatremia 12/18/2021   Mass of soft tissue of left lower extremity 10/03/2022   Metabolic acidosis 12/18/2021    Pancreatitis    PEG (percutaneous endoscopic gastrostomy) adjustment/replacement/removal (HCC) 01/16/2023   Status post insertion of percutaneous endoscopic gastrostomy (PEG) tube (HCC) 01/09/2023   Transaminitis 08/24/2014   Tremor 08/24/2014   Visual hallucination    Visual hallucinations 12/18/2021   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    Past Surgical History:  Procedure Laterality Date   FOOT SURGERY Bilateral    As infant   INSERTION OF MESH Bilateral 09/25/2022   Procedure: INSERTION OF MESH;  Surgeon: Lane Shope, MD;  Location: [facility] ORS;  Service: General;  Laterality: Bilateral;   PEG PLACEMENT N/A 12/08/2022   Procedure: PERCUTANEOUS ENDOSCOPIC GASTROSTOMY (PEG) PLACEMENT;  Surgeon: Toledo, Ladell POUR, MD;  Location: [facility] ENDOSCOPY;  Service: Gastroenterology;  Laterality: N/A;   WRIST SURGERY Right      reports that he has been smoking cigarettes. He has been exposed to tobacco smoke. He has never used smokeless tobacco. He reports that he does not currently use alcohol after a past usage of about 63.0 standard drinks of alcohol per week. He reports current drug use. Drug: Amphetamines.  Allergies[1]  Family History  Problem Relation Age of Onset   Hypertension Mother      Prior to Admission medications  Medication Sig Start Date End Date Taking? Authorizing Provider  acetaminophen  (TYLENOL ) 325 MG tablet Place 2 tablets (650 mg total) into feeding tube every 6 (six) hours as needed for mild pain (or Fever >/= 101). 12/19/22   Dorinda Drue DASEN, MD  folic acid  (FOLVITE ) 1 MG tablet TAKE 1 TABLET(1 MG) BY MOUTH DAILY 07/20/24   Fernand Fredy RAMAN,  MD  Pancrelipase , Lip-Prot-Amyl, 24000-76000 units CPEP Take 1 capsule (24,000 Units total) by mouth 3 (three) times daily before meals. 06/12/23   Fernand Fredy RAMAN, MD  pantoprazole  (PROTONIX ) 40 MG tablet Take 1 tablet (40 mg total) by mouth daily. 06/12/23   Fernand Fredy RAMAN, MD  thiamine  (VITAMIN B-1) 100 MG tablet Take 1  tablet (100 mg total) by mouth daily. 06/12/23   Fernand Fredy RAMAN, MD    Physical Exam: Vitals:   08/05/24 0834 08/05/24 1300  BP: 128/89 111/76  Pulse: 96 85  Resp: 20 17  Temp: 97.7 F (36.5 C) 98 F (36.7 C)  TempSrc: Oral Oral  SpO2: 98% 99%    Constitutional: NAD, calm, comfortable Vitals:   08/05/24 0834 08/05/24 1300  BP: 128/89 111/76  Pulse: 96 85  Resp: 20 17  Temp: 97.7 F (36.5 C) 98 F (36.7 C)  TempSrc: Oral Oral  SpO2: 98% 99%   Eyes: PERRL, lids and conjunctivae normal ENMT: Mucous membranes are moist. Posterior pharynx clear of any exudate or lesions.Normal dentition.  Neck: normal, supple, no masses, no thyromegaly Respiratory: clear to auscultation bilaterally, no wheezing, no crackles. Normal respiratory effort. No accessory muscle use.  Cardiovascular: Regular rate and rhythm, no murmurs / rubs / gallops. No extremity edema. 2+ pedal pulses. No carotid bruits.  Abdomen: Tenderness on epigastric area, no rebound no guarding, no masses palpated. No hepatosplenomegaly. Bowel sounds positive.  Musculoskeletal: no clubbing / cyanosis. No joint deformity upper and lower extremities. Good ROM, no contractures. Normal muscle tone.  Skin: no rashes, lesions, ulcers. No induration Neurologic: CN 2-12 grossly intact. Sensation intact, DTR normal. Strength 5/5 in all 4.  Psychiatric: Normal judgment and insight. Alert and oriented x 3. Normal mood.     Labs on Admission: I have personally reviewed following labs and imaging studies  CBC: Recent Labs  Lab 08/05/24 0841  WBC 6.2  HGB 14.9  HCT 42.0  MCV 95.2  PLT 109*   Basic Metabolic Panel: Recent Labs  Lab 08/05/24 0841  NA 136  K 3.1*  CL 96*  CO2 30  GLUCOSE 160*  BUN <5*  CREATININE 0.74  CALCIUM  9.3  MG 1.5*   GFR: CrCl cannot be calculated (Unknown ideal weight.). Liver Function Tests: Recent Labs  Lab 08/05/24 0841  AST 130*  ALT 34  ALKPHOS 108  BILITOT 2.1*  PROT 8.1   ALBUMIN 4.0   Recent Labs  Lab 08/05/24 0841  LIPASE 142*   No results for input(s): AMMONIA in the last 168 hours. Coagulation Profile: No results for input(s): INR, PROTIME in the last 168 hours. Cardiac Enzymes: No results for input(s): CKTOTAL, CKMB, CKMBINDEX, TROPONINI in the last 168 hours. BNP (last 3 results) No results for input(s): PROBNP in the last 8760 hours. HbA1C: No results for input(s): HGBA1C in the last 72 hours. CBG: No results for input(s): GLUCAP in the last 168 hours. Lipid Profile: No results for input(s): CHOL, HDL, LDLCALC, TRIG, CHOLHDL, LDLDIRECT in the last 72 hours. Thyroid Function Tests: No results for input(s): TSH, T4TOTAL, FREET4, T3FREE, THYROIDAB in the last 72 hours. Anemia Panel: No results for input(s): VITAMINB12, FOLATE, FERRITIN, TIBC, IRON, RETICCTPCT in the last 72 hours. Urine analysis:    Component Value Date/Time   COLORURINE YELLOW (A) 09/04/2023 1025   APPEARANCEUR CLEAR (A) 09/04/2023 1025   LABSPEC >1.046 (H) 09/04/2023 1025   PHURINE 5.0 09/04/2023 1025   GLUCOSEU NEGATIVE 09/04/2023 1025   HGBUR NEGATIVE 09/04/2023 1025  BILIRUBINUR NEGATIVE 09/04/2023 1025   KETONESUR NEGATIVE 09/04/2023 1025   PROTEINUR NEGATIVE 09/04/2023 1025   NITRITE NEGATIVE 09/04/2023 1025   LEUKOCYTESUR NEGATIVE 09/04/2023 1025    Radiological Exams on Admission: CT ABDOMEN PELVIS W CONTRAST Result Date: 08/05/2024 CLINICAL DATA:  Epigastric abdominal pain for 1 month. EXAM: CT ABDOMEN AND PELVIS WITH CONTRAST TECHNIQUE: Multidetector CT imaging of the abdomen and pelvis was performed using the standard protocol following bolus administration of intravenous contrast. RADIATION DOSE REDUCTION: This exam was performed according to the departmental dose-optimization program which includes automated exposure control, adjustment of the mA and/or kV according to patient size and/or use of  iterative reconstruction technique. CONTRAST:  OMNIPAQUE  IOHEXOL  300 MG/ML  SOLN COMPARISON:  September 04, 2023 FINDINGS: Lower chest: Moderate size hiatal hernia is noted. Visualized lung bases are unremarkable. Hepatobiliary: Hepatic steatosis. Hepatic cirrhosis. No biliary dilatation. Mildly distended gallbladder without cholelithiasis. Pancreas: Unremarkable. No pancreatic ductal dilatation or surrounding inflammatory changes. Spleen: Normal in size without focal abnormality. Adrenals/Urinary Tract: Adrenal glands appear normal. Left renal cyst is noted. No hydronephrosis or renal obstruction is noted. Urinary bladder is unremarkable. Stomach/Bowel: Stomach is within normal limits. Appendix appears normal. No evidence of bowel wall thickening, distention, or inflammatory changes. Vascular/Lymphatic: No significant vascular abnormality is noted. Stable mildly enlarged lymph nodes are noted in porta hepatis region most likely related to hepatic disease. Reproductive: Prostate is unremarkable. Other: No abdominal wall hernia or abnormality. No abdominopelvic ascites. Musculoskeletal: No acute or significant osseous findings. IMPRESSION: 1. Moderate size hiatal hernia. 2. Hepatic steatosis. Hepatic cirrhosis. 3. Stable mildly enlarged lymph nodes are noted in porta hepatis region most likely related to hepatic disease. Electronically Signed   By: Lynwood Landy Raddle M.D.   On: 08/05/2024 10:37    EKG: Independently reviewed.  Sinus rhythm, no acute ST changes.  Assessment/Plan Principal Problem:   Pancreatitis Active Problems:   Alcohol abuse   Alcohol induced acute pancreatitis  (please populate well all problems here in Problem List. (For example, if patient is on BP meds at home and you resume or decide to hold them, it is a problem that needs to be her. Same for CAD, COPD, HLD and so on)  Acute recurrent alcoholic pancreatitis - Pain management - Patient would like to try clear liquid diet -  Educated patient regarding importance of abstinence from alcohol use to avoid future flareup  Alcohol abuse - Currently there is no symptoms or signs of active alcohol withdrawal - Continue CIWA protocol with as needed benzos  Hypokalemia - P.o. replacement  Hypomagnesemia - IV replacement, recheck level tomorrow  Chronic pancreatic insufficiency - Continue Creon   DVT prophylaxis: SCD Code Status: Full code Family Communication: Fianc at bedside Disposition Plan: Expect less than 2 midnight hospital stay Consults called: None Admission status: MedSurg observation   Cort ONEIDA Mana MD [facility] Pager [phone]  08/05/2024, 1:12 PM       [1]  Allergies Allergen Reactions   Tramadol  Nausea And Vomiting   "

## 2024-08-06 DIAGNOSIS — E876 Hypokalemia: Secondary | ICD-10-CM | POA: Diagnosis present

## 2024-08-06 DIAGNOSIS — Z8661 Personal history of infections of the central nervous system: Secondary | ICD-10-CM | POA: Diagnosis not present

## 2024-08-06 DIAGNOSIS — Z8249 Family history of ischemic heart disease and other diseases of the circulatory system: Secondary | ICD-10-CM | POA: Diagnosis not present

## 2024-08-06 DIAGNOSIS — K852 Alcohol induced acute pancreatitis without necrosis or infection: Secondary | ICD-10-CM | POA: Diagnosis present

## 2024-08-06 DIAGNOSIS — Z87891 Personal history of nicotine dependence: Secondary | ICD-10-CM | POA: Diagnosis not present

## 2024-08-06 DIAGNOSIS — R109 Unspecified abdominal pain: Secondary | ICD-10-CM | POA: Diagnosis present

## 2024-08-06 DIAGNOSIS — F1010 Alcohol abuse, uncomplicated: Secondary | ICD-10-CM | POA: Diagnosis present

## 2024-08-06 DIAGNOSIS — K219 Gastro-esophageal reflux disease without esophagitis: Secondary | ICD-10-CM | POA: Diagnosis present

## 2024-08-06 DIAGNOSIS — G8929 Other chronic pain: Secondary | ICD-10-CM | POA: Diagnosis present

## 2024-08-06 LAB — BASIC METABOLIC PANEL WITH GFR
Anion gap: 5 (ref 5–15)
BUN: 6 mg/dL — ABNORMAL LOW (ref 8–23)
CO2: 30 mmol/L (ref 22–32)
Calcium: 8.4 mg/dL — ABNORMAL LOW (ref 8.9–10.3)
Chloride: 102 mmol/L (ref 98–111)
Creatinine, Ser: 0.64 mg/dL (ref 0.61–1.24)
GFR, Estimated: >60 mL/min
Glucose, Bld: 88 mg/dL (ref 70–99)
Potassium: 4.3 mmol/L (ref 3.5–5.1)
Sodium: 137 mmol/L (ref 135–145)

## 2024-08-06 LAB — LIPASE, BLOOD: Lipase: 80 U/L — ABNORMAL HIGH (ref 11–51)

## 2024-08-06 LAB — MAGNESIUM: Magnesium: 1.7 mg/dL (ref 1.7–2.4)

## 2024-08-06 NOTE — Progress Notes (Signed)
 " Progress Note   Patient: Luke Rogers. FMW:[id number] DOB: 11-02-1960 DOA: 08/05/2024     0 DOS: the patient was seen and examined on 08/06/2024   Brief hospital course: From HPI Luke Rogers. is a 64 y.o. male with medical history significant of alcohol use, recurrent alcoholic pancreatitis, chronic pancreatic insufficiency, hepatitis C, presented with worsening of GI symptoms including abdominal pain, nauseous vomiting diarrhea.   Patient has been doing binge drinking on and off for last month.  Last drink this morning.  Intermittently has been having worsening of epigastric pain radiating to the back, last few days he has been having frequent nauseous vomiting with stomach content nonbloody nonbilious and loose diarrhea but denied any tenesmus no fever or chills. ED Course: Afebrile, not tachycardia no hypotension not hypoxic.  CT abdomen pelvis with no acute findings, blood work showed magnesium  1.5 K3.2 BUN 5 creatinine 0.7 lipase 142   Patient was given morphine  x 2 Zofran  in the ED.     Assessment and Plan: Acute recurrent alcoholic pancreatitis - Continue pain management Diet advanced to full liquid diet today Continue IV fluid   Alcohol abuse - Currently there is no symptoms or signs of active alcohol withdrawal - Continue CIWA protocol with as needed benzos   Hypokalemia - P.o. replacement   Hypomagnesemia Improving continue to monitor   Chronic pancreatic insufficiency - Continue Creon    DVT prophylaxis: SCD Code Status: Full code Family Communication: Fianc at bedside Disposition Plan: Expect less than 2 midnight hospital stay Consults called: None Admission status: MedSurg observation    Subjective:  Patient seen and examined at bedside this morning Weaned off oxygen today Continues to have tremors of the upper extremities Diet has been advanced from clear liquid to full liquid today Complaining of low back pain that is chronic  Physical  Exam: General: Middle-age male laying in bed appears older than age Respiratory: clear to auscultation bilaterally, no wheezing Cardiovascular: Regular rate and rhythm Abdomen: Tenderness noted in the epigastric area.  Musculoskeletal: no clubbing / cyanosis. No joint deformity upper and lower extremities. Good ROM, no contractures. Normal muscle tone.  Skin: no rashes, lesions, ulcers. No induration Neurologic: CN 2-12 grossly intact. Sensation intact, DTR normal. Strength 5/5 in all 4.  Psychiatric: Normal judgment and insight. Alert and oriented x 3. Normal mood.     Vitals:   08/06/24 0154 08/06/24 0607 08/06/24 1032 08/06/24 1446  BP: 102/63 105/73 122/83 126/83  Pulse: 76 68 76 83  Resp: 15 16 18 18   Temp: 98.2 F (36.8 C) 98.8 F (37.1 C) 99.1 F (37.3 C) 99.3 F (37.4 C)  TempSrc:   Oral Oral  SpO2: 98% 95% 95% 96%  Weight:      Height:        Data Reviewed: CT scan of the abdomen reviewed and showed hepatic steatosis and hepatic cirrhosis    Latest Ref Rng & Units 08/05/2024    8:41 AM 09/05/2023    8:34 AM 09/04/2023    5:41 AM  CBC  WBC 4.0 - 10.5 K/uL 6.2  12.3  10.9   Hemoglobin 13.0 - 17.0 g/dL 85.0  86.9  86.3   Hematocrit 39.0 - 52.0 % 42.0  39.6  40.7   Platelets 150 - 400 K/uL 109  130  153        Latest Ref Rng & Units 08/06/2024    4:57 AM 08/05/2024    8:41 AM 09/07/2023    5:05  AM  BMP  Glucose 70 - 99 mg/dL 88  839  895   BUN 8 - 23 mg/dL 6  <5  12   Creatinine 0.61 - 1.24 mg/dL 9.35  9.25  9.31   Sodium 135 - 145 mmol/L 137  136  134   Potassium 3.5 - 5.1 mmol/L 4.3  3.1  3.6   Chloride 98 - 111 mmol/L 102  96  103   CO2 22 - 32 mmol/L 30  30  24    Calcium  8.9 - 10.3 mg/dL 8.4  9.3  8.4       Author: Drue ONEIDA Potter, MD 08/06/2024 5:07 PM  For on call review [email or web address].  "

## 2024-08-06 NOTE — Plan of Care (Signed)

## 2024-08-06 NOTE — TOC CM/SW Note (Signed)
 Transition of Care [facility]) - Inpatient Brief Assessment   Patient Details  Name: Luke Rogers. MRN: [id number] Date of Birth: 1961-04-24  [facility] CM/SW Contact:    Shasta DELENA Daring, RN Phone Number: 08/06/2024, 8:42 AM   Clinical Narrative: Transition of Care consult received for Substance Abuse counseling. TOC does not provide counseling. Subastance Abuse resources have been added to the AVS. No additional TOC needs identified.    Transition of Care Asessment: Insurance and Status: Insurance coverage has been reviewed Patient has primary care physician: Yes   Prior level of function:: independent   Social Drivers of Health Review: SDOH reviewed interventions complete   Transition of care needs: no transition of care needs at this time

## 2024-08-06 NOTE — Plan of Care (Signed)
" °  Problem: Education: Goal: Knowledge of General Education information will improve Description: Including pain rating scale, medication(s)/side effects and non-pharmacologic comfort measures 08/06/2024 1539 by Arrie Braden SAUNDERS, RN Outcome: Progressing 08/06/2024 1539 by Arrie Braden SAUNDERS, RN Outcome: Progressing   Problem: Health Behavior/Discharge Planning: Goal: Ability to manage health-related needs will improve 08/06/2024 1539 by Arrie Braden SAUNDERS, RN Outcome: Progressing 08/06/2024 1539 by Arrie Braden SAUNDERS, RN Outcome: Progressing   Problem: Clinical Measurements: Goal: Ability to maintain clinical measurements within normal limits will improve 08/06/2024 1539 by Arrie Braden SAUNDERS, RN Outcome: Progressing 08/06/2024 1539 by Arrie Braden SAUNDERS, RN Outcome: Progressing Goal: Will remain free from infection 08/06/2024 1539 by Arrie Braden SAUNDERS, RN Outcome: Progressing 08/06/2024 1539 by Arrie Braden SAUNDERS, RN Outcome: Progressing Goal: Diagnostic test results will improve 08/06/2024 1539 by Arrie Braden SAUNDERS, RN Outcome: Progressing 08/06/2024 1539 by Arrie Braden SAUNDERS, RN Outcome: Progressing Goal: Respiratory complications will improve 08/06/2024 1539 by Arrie Braden SAUNDERS, RN Outcome: Progressing 08/06/2024 1539 by Arrie Braden SAUNDERS, RN Outcome: Progressing Goal: Cardiovascular complication will be avoided 08/06/2024 1539 by Arrie Braden SAUNDERS, RN Outcome: Progressing 08/06/2024 1539 by Arrie Braden SAUNDERS, RN Outcome: Progressing   Problem: Activity: Goal: Risk for activity intolerance will decrease 08/06/2024 1539 by Arrie Braden SAUNDERS, RN Outcome: Progressing 08/06/2024 1539 by Arrie Braden SAUNDERS, RN Outcome: Progressing   Problem: Nutrition: Goal: Adequate nutrition will be maintained 08/06/2024 1539 by Arrie Braden SAUNDERS, RN Outcome: Progressing 08/06/2024 1539 by Arrie Braden SAUNDERS, RN Outcome:  Progressing   Problem: Coping: Goal: Level of anxiety will decrease 08/06/2024 1539 by Arrie Braden SAUNDERS, RN Outcome: Progressing 08/06/2024 1539 by Arrie Braden SAUNDERS, RN Outcome: Progressing   Problem: Elimination: Goal: Will not experience complications related to bowel motility 08/06/2024 1539 by Arrie Braden SAUNDERS, RN Outcome: Progressing 08/06/2024 1539 by Arrie Braden SAUNDERS, RN Outcome: Progressing Goal: Will not experience complications related to urinary retention 08/06/2024 1539 by Arrie Braden SAUNDERS, RN Outcome: Progressing 08/06/2024 1539 by Arrie Braden SAUNDERS, RN Outcome: Progressing   Problem: Pain Managment: Goal: General experience of comfort will improve and/or be controlled 08/06/2024 1539 by Arrie Braden SAUNDERS, RN Outcome: Progressing 08/06/2024 1539 by Arrie Braden SAUNDERS, RN Outcome: Progressing   Problem: Safety: Goal: Ability to remain free from injury will improve 08/06/2024 1539 by Arrie Braden SAUNDERS, RN Outcome: Progressing 08/06/2024 1539 by Arrie Braden SAUNDERS, RN Outcome: Progressing   Problem: Skin Integrity: Goal: Risk for impaired skin integrity will decrease 08/06/2024 1539 by Arrie Braden SAUNDERS, RN Outcome: Progressing 08/06/2024 1539 by Arrie Braden SAUNDERS, RN Outcome: Progressing   "

## 2024-08-06 NOTE — Discharge Instructions (Signed)

## 2024-08-07 ENCOUNTER — Other Ambulatory Visit: Payer: Self-pay

## 2024-08-07 DIAGNOSIS — K852 Alcohol induced acute pancreatitis without necrosis or infection: Secondary | ICD-10-CM | POA: Diagnosis not present

## 2024-08-07 LAB — CBC WITH DIFFERENTIAL/PLATELET
Abs Immature Granulocytes: 0.02 K/uL (ref 0.00–0.07)
Basophils Absolute: 0.1 K/uL (ref 0.0–0.1)
Basophils Relative: 1 %
Eosinophils Absolute: 0.2 K/uL (ref 0.0–0.5)
Eosinophils Relative: 3 %
HCT: 36.7 % — ABNORMAL LOW (ref 39.0–52.0)
Hemoglobin: 12.6 g/dL — ABNORMAL LOW (ref 13.0–17.0)
Immature Granulocytes: 0 %
Lymphocytes Relative: 33 %
Lymphs Abs: 2 K/uL (ref 0.7–4.0)
MCH: 33.9 pg (ref 26.0–34.0)
MCHC: 34.3 g/dL (ref 30.0–36.0)
MCV: 98.7 fL (ref 80.0–100.0)
Monocytes Absolute: 0.6 K/uL (ref 0.1–1.0)
Monocytes Relative: 9 %
Neutro Abs: 3.2 K/uL (ref 1.7–7.7)
Neutrophils Relative %: 54 %
Platelets: 85 K/uL — ABNORMAL LOW (ref 150–400)
RBC: 3.72 MIL/uL — ABNORMAL LOW (ref 4.22–5.81)
RDW: 12.7 % (ref 11.5–15.5)
WBC: 6 K/uL (ref 4.0–10.5)
nRBC: 0 % (ref 0.0–0.2)

## 2024-08-07 LAB — BASIC METABOLIC PANEL WITH GFR
Anion gap: 7 (ref 5–15)
BUN: 12 mg/dL (ref 8–23)
CO2: 28 mmol/L (ref 22–32)
Calcium: 8.9 mg/dL (ref 8.9–10.3)
Chloride: 99 mmol/L (ref 98–111)
Creatinine, Ser: 0.73 mg/dL (ref 0.61–1.24)
GFR, Estimated: >60 mL/min
Glucose, Bld: 80 mg/dL (ref 70–99)
Potassium: 4.3 mmol/L (ref 3.5–5.1)
Sodium: 135 mmol/L (ref 135–145)

## 2024-08-07 MED ORDER — VITAMIN B-1 100 MG PO TABS
100.0000 mg | ORAL_TABLET | Freq: Every day | ORAL | 0 refills | Status: AC
  Filled 2024-08-07 – 2024-08-08 (×2): qty 30, 30d supply, fill #0

## 2024-08-07 MED ORDER — NICOTINE 21 MG/24HR TD PT24
21.0000 mg | MEDICATED_PATCH | Freq: Every day | TRANSDERMAL | Status: DC
  Administered 2024-08-07: 21 mg via TRANSDERMAL
  Filled 2024-08-07: qty 1

## 2024-08-07 MED ORDER — PANCRELIPASE (LIP-PROT-AMYL) 24000-76000 UNITS PO CPEP
24000.0000 [IU] | ORAL_CAPSULE | Freq: Three times a day (TID) | ORAL | 1 refills | Status: AC
  Filled 2024-08-07: qty 100, 34d supply, fill #0
  Filled 2024-08-08: qty 270, 90d supply, fill #0
  Filled 2024-08-08: qty 200, 67d supply, fill #0

## 2024-08-07 MED ORDER — ACETAMINOPHEN 325 MG PO TABS
650.0000 mg | ORAL_TABLET | Freq: Four times a day (QID) | ORAL | 0 refills | Status: AC | PRN
  Filled 2024-08-07: qty 20, 3d supply, fill #0

## 2024-08-07 MED ORDER — FOLIC ACID 1 MG PO TABS
1.0000 mg | ORAL_TABLET | Freq: Every day | ORAL | 0 refills | Status: AC
  Filled 2024-08-07 – 2024-08-08 (×2): qty 30, 30d supply, fill #0

## 2024-08-07 MED ORDER — PANTOPRAZOLE SODIUM 40 MG PO TBEC
40.0000 mg | DELAYED_RELEASE_TABLET | Freq: Every day | ORAL | 3 refills | Status: AC
  Filled 2024-08-07 – 2024-08-08 (×2): qty 90, 90d supply, fill #0

## 2024-08-07 MED ORDER — SENNOSIDES-DOCUSATE SODIUM 8.6-50 MG PO TABS
1.0000 | ORAL_TABLET | Freq: Every evening | ORAL | 0 refills | Status: AC | PRN
  Filled 2024-08-07 – 2024-08-08 (×2): qty 30, 30d supply, fill #0

## 2024-08-07 MED ORDER — OXYCODONE HCL 5 MG PO TABS
5.0000 mg | ORAL_TABLET | ORAL | Status: DC | PRN
  Administered 2024-08-07: 5 mg via ORAL
  Filled 2024-08-07: qty 1

## 2024-08-07 NOTE — Discharge Summary (Signed)
 " Physician Discharge Summary   Patient: Luke Rogers. MRN: [id number] DOB: 07-31-1960  Admit date:     08/05/2024  Discharge date: 08/07/24  Discharge Physician: Drue ONEIDA Potter   PCP: Fernand Fredy RAMAN, MD   Recommendations at discharge:  Follow-up with PCP Follow-up with alcohol rehab programs as an outpatient  Discharge Diagnoses: Principal Problem:   Pancreatitis Active Problems:   Alcohol abuse   Alcohol induced acute pancreatitis  Resolved Problems:   * No resolved hospital problems. [place] [facility] Course:  From HPI Siegfried Vieth. is a 64 y.o. male with medical history significant of alcohol use, recurrent alcoholic pancreatitis, chronic pancreatic insufficiency, hepatitis C, presented with worsening of GI symptoms including abdominal pain, nauseous vomiting diarrhea.   Patient has been doing binge drinking on and off for last month.  Last drink this morning.  Intermittently has been having worsening of epigastric pain radiating to the back, last few days he has been having frequent nauseous vomiting with stomach content nonbloody nonbilious and loose diarrhea but denied any tenesmus no fever or chills. ED Course: Afebrile, not tachycardia no hypotension not hypoxic.  CT abdomen pelvis with no acute findings, blood work showed magnesium  1.5 K3.2 BUN 5 creatinine 0.7 lipase 142   Patient was given morphine  x 2 Zofran  in the ED.    Other hospital course as noted below  Assessment and Plan: Acute recurrent alcoholic pancreatitis - Continue pain management Tolerated advance diet Patient received IV fluid with improvement   Alcohol abuse - Currently there is no symptoms or signs of active alcohol withdrawal - Continue CIWA protocol with as needed benzos   Hypokalemia - P.o. replacement   Hypomagnesemia Improving continue to monitor   Chronic pancreatic insufficiency - Continue Creon     Consultants: None Procedures performed: None Disposition:  Home Diet recommendation:  Cardiac diet DISCHARGE MEDICATION: Allergies as of 08/07/2024       Reactions   Amoxicillin Diarrhea   Tramadol  Nausea And Vomiting        Medication List     TAKE these medications    acetaminophen  325 MG tablet Commonly known as: TYLENOL  Place 2 tablets (650 mg total) into feeding tube every 6 (six) hours as needed for mild pain (pain score 1-3) (or Fever >/= 101).   folic acid  1 MG tablet Commonly known as: FOLVITE  Take 1 tablet (1 mg total) by mouth daily. What changed: See the new instructions.   Pancrelipase  (Lip-Prot-Amyl) 24000-76000 units Cpep Take 1 capsule (24,000 Units total) by mouth 3 (three) times daily before meals.   pantoprazole  40 MG tablet Commonly known as: PROTONIX  Take 1 tablet (40 mg total) by mouth daily.   senna-docusate 8.6-50 MG tablet Commonly known as: Senokot-S Take 1 tablet by mouth at bedtime as needed for mild constipation.   thiamine  100 MG tablet Commonly known as: Vitamin B-1 Take 1 tablet (100 mg total) by mouth daily.        Discharge Exam: Filed Weights   08/05/24 1921  Weight: 73.5 kg   General: Middle-age male laying in bed appears older than age Respiratory: clear to auscultation bilaterally, no wheezing Cardiovascular: Regular rate and rhythm Abdomen: Nontender Musculoskeletal: no clubbing / cyanosis. No joint deformity upper and lower extremities. Good ROM, no contractures. Normal muscle tone.  Skin: no rashes, lesions, ulcers. No induration Neurologic: CN 2-12 grossly intact. Sensation intact, DTR normal. Strength 5/5 in all 4.  Psychiatric: Normal judgment and insight. Alert and oriented x 3.  Normal mood.   Condition at discharge: good  The results of significant diagnostics from this hospitalization (including imaging, microbiology, ancillary and laboratory) are listed below for reference.   Imaging Studies: CT ABDOMEN PELVIS W CONTRAST Result Date: 08/05/2024 CLINICAL DATA:   Epigastric abdominal pain for 1 month. EXAM: CT ABDOMEN AND PELVIS WITH CONTRAST TECHNIQUE: Multidetector CT imaging of the abdomen and pelvis was performed using the standard protocol following bolus administration of intravenous contrast. RADIATION DOSE REDUCTION: This exam was performed according to the departmental dose-optimization program which includes automated exposure control, adjustment of the mA and/or kV according to patient size and/or use of iterative reconstruction technique. CONTRAST:  OMNIPAQUE  IOHEXOL  300 MG/ML  SOLN COMPARISON:  September 04, 2023 FINDINGS: Lower chest: Moderate size hiatal hernia is noted. Visualized lung bases are unremarkable. Hepatobiliary: Hepatic steatosis. Hepatic cirrhosis. No biliary dilatation. Mildly distended gallbladder without cholelithiasis. Pancreas: Unremarkable. No pancreatic ductal dilatation or surrounding inflammatory changes. Spleen: Normal in size without focal abnormality. Adrenals/Urinary Tract: Adrenal glands appear normal. Left renal cyst is noted. No hydronephrosis or renal obstruction is noted. Urinary bladder is unremarkable. Stomach/Bowel: Stomach is within normal limits. Appendix appears normal. No evidence of bowel wall thickening, distention, or inflammatory changes. Vascular/Lymphatic: No significant vascular abnormality is noted. Stable mildly enlarged lymph nodes are noted in porta hepatis region most likely related to hepatic disease. Reproductive: Prostate is unremarkable. Other: No abdominal wall hernia or abnormality. No abdominopelvic ascites. Musculoskeletal: No acute or significant osseous findings. IMPRESSION: 1. Moderate size hiatal hernia. 2. Hepatic steatosis. Hepatic cirrhosis. 3. Stable mildly enlarged lymph nodes are noted in porta hepatis region most likely related to hepatic disease. Electronically Signed   By: Lynwood Landy Raddle M.D.   On: 08/05/2024 10:37    Microbiology: Results for orders placed or performed during  the hospital encounter of 01/09/23  C Difficile Quick Screen w PCR reflex     Status: None   Collection Time: 01/09/23  6:29 PM   Specimen: Urine, Clean Catch; Stool  Result Value Ref Range Status   C Diff antigen NEGATIVE NEGATIVE Final   C Diff toxin NEGATIVE NEGATIVE Final   C Diff interpretation No C. difficile detected.  Final    Comment: Performed at [facility], [address] [place], [place] [address]    Labs: CBC: Recent Labs  Lab 08/05/24 0841 08/07/24 0620  WBC 6.2 6.0  NEUTROABS  --  3.2  HGB 14.9 12.6*  HCT 42.0 36.7*  MCV 95.2 98.7  PLT 109* 85*   Basic Metabolic Panel: Recent Labs  Lab 08/05/24 0841 08/06/24 0457 08/07/24 0620  NA 136 137 135  K 3.1* 4.3 4.3  CL 96* 102 99  CO2 30 30 28   GLUCOSE 160* 88 80  BUN <5* 6* 12  CREATININE 0.74 0.64 0.73  CALCIUM  9.3 8.4* 8.9  MG 1.5* 1.7  --   PHOS 2.2*  --   --    Liver Function Tests: Recent Labs  Lab 08/05/24 0841  AST 130*  ALT 34  ALKPHOS 108  BILITOT 2.1*  PROT 8.1  ALBUMIN 4.0   CBG: No results for input(s): GLUCAP in the last 168 hours.  Discharge time spent:  36 minutes.  Signed: Drue ONEIDA Potter, MD [facility] 08/07/2024 "

## 2024-08-07 NOTE — TOC Transition Note (Signed)
 Transition of Care [facility]) - Discharge Note   Patient Details  Name: Luke Rogers. MRN: [id number] Date of Birth: [birth year]  [facility]) CM/SW Contact:  Shasta DELENA Daring, RN Phone Number: 08/07/2024, 4:51 PM   Clinical Narrative:    Patinet will discharge home. Sister will pick him up. Outpatient rehab ordres faxed to [facility] [place].   No additional TOC needs identified.  RNCM signing off.   Final next level of care: Home/Self Care     Patient Goals and CMS Choice            Discharge Placement                       Discharge Plan and Services Additional resources added to the After Visit Summary for                                       Social Drivers of Health (SDOH) Interventions SDOH Screenings   Food Insecurity: No Food Insecurity (08/06/2024)  Housing: Low Risk (08/06/2024)  Transportation Needs: No Transportation Needs (08/06/2024)  Utilities: Not At Risk (08/06/2024)  Tobacco Use: High Risk (08/05/2024)     Readmission Risk Interventions    01/11/2023    9:17 AM  Readmission Risk Prevention Plan  Transportation Screening Complete  Medication Review (RN Care Manager) Complete  PCP or Specialist appointment within 3-5 days of discharge Complete  HRI or Home Care Consult Patient refused  SW Recovery Care/Counseling Consult Complete  Palliative Care Screening Not Applicable  Skilled Nursing Facility Not Applicable

## 2024-08-07 NOTE — Progress Notes (Signed)
 Pt states that he did not need the meds being brought by o/p pharmacy, stating he already had at home and didn't need Senokot.  Pharmacy made aware of same and daughter at bedside aware.

## 2024-08-07 NOTE — Evaluation (Signed)
 Physical Therapy Evaluation Patient Details Name: Luke Rogers. MRN: [id number] DOB: Jan 18, 1961 Today's Date: 08/07/2024  History of Present Illness  Pt is a 64 y.o. male with medical history significant of alcohol use, recurrent alcoholic pancreatitis, chronic pancreatic insufficiency, hepatitis C, presented with worsening of GI symptoms including abdominal pain, nauseous vomiting diarrhea. MD assessment includes: acute recurrent alcoholic pancreatitis, alcohol abuse, hypokalemia, and hypomagnesemia.   Clinical Impression  Pt was pleasant and motivated to participate during the session and put forth good effort throughout. Pt required no physical assistance with below functional tasks and demonstrated good control with transfers without an AD.  Once ambulating without an AD pt did present with minor drifting/instability that he was able to self-correct but presented with grossly improved stability with a RW as demonstrated by increased cadence and decreased drifting left/right as well as no noted posterior instability that was seen while ambulating without an AD.  Pt reported no adverse symptoms during the session with SpO2 and HR WNL on room air.  Pt will benefit from continued PT services upon discharge to safely address deficits listed in patient problem list for decreased caregiver assistance and eventual return to PLOF.          If plan is discharge home, recommend the following: A little help with walking and/or transfers;A little help with bathing/dressing/bathroom;Assistance with cooking/housework;Assist for transportation;Help with stairs or ramp for entrance   Can travel by private vehicle        Equipment Recommendations Rolling walker (2 wheels)  Recommendations for Other Services       Functional Status Assessment Patient has had a recent decline in their functional status and demonstrates the ability to make significant improvements in function in a reasonable and  predictable amount of time.     Precautions / Restrictions Precautions Precautions: Fall Restrictions Weight Bearing Restrictions Per Provider Order: No      Mobility  Bed Mobility Overal bed mobility: Independent             General bed mobility comments: Good speed and control with bed mobility tasks without the need of the bed rails    Transfers Overall transfer level: Needs assistance Equipment used: None, Rolling walker (2 wheels) Transfers: Sit to/from Stand Sit to Stand: Supervision           General transfer comment: Good eccentric and concentric control    Ambulation/Gait Ambulation/Gait assistance: Contact guard assist Gait Distance (Feet): 25 Feet x 1 without AD, 150 Feet x 1 with RW Assistive device: None, Rolling walker (2 wheels) Gait Pattern/deviations: Step-through pattern, Decreased stride length, Drifts right/left Gait velocity: decreased     General Gait Details: Pt able to amb 25 feet without an AD with some noted drifting and posterior instability that the pt was able to self correct; ambulated 150 feet with a RW with improved stability and no overt LOB  Stairs            Wheelchair Mobility     Tilt Bed    Modified Rankin (Stroke Patients Only)       Balance Overall balance assessment: Needs assistance   Sitting balance-Leahy Scale: Normal     Standing balance support: No upper extremity supported, During functional activity Standing balance-Leahy Scale: Fair                               Pertinent Vitals/Pain Pain Assessment Pain Assessment: No/denies pain  Home Living Family/patient expects to be discharged to:: Private residence Living Arrangements: Spouse/significant other;Parent;Other relatives (parents who have dementia, sister and brother in law; reports his wife is moving back to [place]) Available Help at Discharge: Family;Available PRN/intermittently Type of Home: House Home Access: Stairs  to enter Entrance Stairs-Rails: None Entrance Stairs-Number of Steps: 1   Home Layout: Two level;Able to live on main level with bedroom/bathroom Home Equipment: Rollator (4 wheels);Shower seat      Prior Function Prior Level of Function : Independent/Modified Independent             Mobility Comments: Ind amb community distances without an AD, no fall history ADLs Comments: Ind with ADLs     Extremity/Trunk Assessment   Upper Extremity Assessment Upper Extremity Assessment: Defer to OT evaluation    Lower Extremity Assessment Lower Extremity Assessment: Generalized weakness       Communication   Communication Communication: No apparent difficulties    Cognition Arousal: Alert Behavior During Therapy: Flat affect                             Following commands: Intact       Cueing Cueing Techniques: Verbal cues     General Comments      Exercises     Assessment/Plan    PT Assessment Patient needs continued PT services  PT Problem List Decreased strength;Decreased activity tolerance;Decreased balance;Decreased mobility;Decreased knowledge of use of DME       PT Treatment Interventions DME instruction;Gait training;Stair training;Functional mobility training;Therapeutic activities;Therapeutic exercise;Balance training;Patient/family education    PT Goals (Current goals can be found in the Care Plan section)  Acute Rehab PT Goals Patient Stated Goal: To get stronger PT Goal Formulation: With patient Time For Goal Achievement: 08/20/24 Potential to Achieve Goals: Good    Frequency Min 3X/week     Co-evaluation               AM-PAC PT 6 Clicks Mobility  Outcome Measure Help needed turning from your back to your side while in a flat bed without using bedrails?: None Help needed moving from lying on your back to sitting on the side of a flat bed without using bedrails?: None Help needed moving to and from a bed to a chair  (including a wheelchair)?: A Little Help needed standing up from a chair using your arms (e.g., wheelchair or bedside chair)?: A Little Help needed to walk in hospital room?: A Little Help needed climbing 3-5 steps with a railing? : A Little 6 Click Score: 20    End of Session Equipment Utilized During Treatment: Gait belt Activity Tolerance: Patient tolerated treatment well Patient left: in bed;with call bell/phone within reach;with bed alarm set;Other (comment) (pt declined up in chair) Nurse Communication: Mobility status PT Visit Diagnosis: Unsteadiness on feet (R26.81);Difficulty in walking, not elsewhere classified (R26.2);Muscle weakness (generalized) (M62.81)    Time: 9079-9062 PT Time Calculation (min) (ACUTE ONLY): 17 min   Charges:   PT Evaluation $PT Eval Moderate Complexity: 1 Mod   PT General Charges $$ ACUTE PT VISIT: 1 Visit       D. Scott Woody Kronberg PT, DPT 08/07/24, 10:00 AM

## 2024-08-07 NOTE — Evaluation (Signed)
 Occupational Therapy Evaluation Patient Details Name: Luke Rogers. MRN: [id number] DOB: Oct 30, 1960 Today's Date: 08/07/2024   History of Present Illness   Pt is a 64 y.o. male with medical history significant of alcohol use, recurrent alcoholic pancreatitis, chronic pancreatic insufficiency, hepatitis C, presented with worsening of GI symptoms including abdominal pain, nauseous vomiting diarrhea. MD assessment includes: acute recurrent alcoholic pancreatitis, alcohol abuse, hypokalemia, and hypomagnesemia.     Clinical Impressions Pt was seen for OT evaluation this date. PTA, pt resides at home with multiple family members. He reports living in the basement with 15 steps to access the main floor. He reports his family members are all debilitated and his wife will be moving back to [place]. At baseline, he is indep with ADLs and mobility without AD use. Pt presents with deficits in strength, balance and safety limiting their ability to perform ADL management at baseline level. Pt currently requires independence with bed mobility, supervision for STS from EOB and CGA to SBA for functional mobility and standing grooming tasks d/t BLE weakness, tremors and pain. Anticipate improved balance with UE support. Pt would benefit from skilled OT services to address noted impairments and functional limitations to maximize safety and independence while minimizing future risk of falls, injury, and readmission. Do anticipate the need for follow up OT services upon acute hospital DC.      If plan is discharge home, recommend the following:   A little help with walking and/or transfers;Assistance with cooking/housework;Help with stairs or ramp for entrance     Functional Status Assessment   Patient has had a recent decline in their functional status and demonstrates the ability to make significant improvements in function in a reasonable and predictable amount of time.     Equipment  Recommendations   None recommended by OT     Recommendations for Other Services         Precautions/Restrictions   Precautions Precautions: Fall Restrictions Weight Bearing Restrictions Per Provider Order: No     Mobility Bed Mobility Overal bed mobility: Independent                  Transfers Overall transfer level: Needs assistance Equipment used: None Transfers: Sit to/from Stand Sit to Stand: Supervision           General transfer comment: supervision to stand from EOB and CGA to SBA to ambulate within the room without AD      Balance Overall balance assessment: Needs assistance Sitting-balance support: Feet supported Sitting balance-Leahy Scale: Normal     Standing balance support: No upper extremity supported, During functional activity, Single extremity supported Standing balance-Leahy Scale: Fair Standing balance comment: tremors noted and BLE weakness                           ADL either performed or assessed with clinical judgement   ADL Overall ADL's : Needs assistance/impaired     Grooming: Wash/dry face;Wash/dry hands;Brushing hair;Standing;Contact guard assist;Supervision/safety Grooming Details (indicate cue type and reason): stood at sink to perform all grooming tasks and hair washing with CGA/SBA throughout for safety d/t tremors and BLE weakness             Lower Body Dressing: Supervision/safety Lower Body Dressing Details (indicate cue type and reason): donned mesh underwear without assist             Functional mobility during ADLs: Contact guard assist General ADL Comments: ambulated within the room  without AD with CGA/SBA     Vision         Perception         Praxis         Pertinent Vitals/Pain Pain Assessment Pain Assessment: 0-10 Pain Score: 9  Pain Location: abdomen/pancreas area and RUE Pain Intervention(s): Monitored during session, Patient requesting pain meds-RN notified      Extremity/Trunk Assessment Upper Extremity Assessment Upper Extremity Assessment: Generalized weakness;Overall [facility] for tasks assessed   Lower Extremity Assessment Lower Extremity Assessment: Generalized weakness       Communication Communication Communication: No apparent difficulties   Cognition Arousal: Alert Behavior During Therapy: Flat affect                                 Following commands: Intact       Cueing  General Comments   Cueing Techniques: Verbal cues      Exercises Other Exercises Other Exercises: Edu on role of OT in acute setting.   Shoulder Instructions      Home Living Family/patient expects to be discharged to:: Private residence Living Arrangements: Spouse/significant other;Parent;Other relatives (parents who have dementia, sister and brother in law; reports his wife is moving back to [place]) Available Help at Discharge: Family;Available PRN/intermittently Type of Home: House Home Access: Stairs to enter [employer] of Steps: 1 Entrance Stairs-Rails: None Home Layout: Two level;Able to live on main level with bedroom/bathroom Alternate Level Stairs-Number of Steps: reports he lives in basement and goes up/down 15/flight of steps   Bathroom Shower/Tub: Tub/shower unit;Walk-in shower   Bathroom Toilet: Standard     Home Equipment: Rollator (4 wheels);Shower seat          Prior Functioning/Environment Prior Level of Function : Independent/Modified Independent             Mobility Comments: Ind amb community distances without an AD, no fall history ADLs Comments: Ind with ADLs    OT Problem List: Decreased strength;Decreased activity tolerance;Impaired balance (sitting and/or standing)   OT Treatment/Interventions: Self-care/ADL training;Patient/family education;Therapeutic exercise;Balance training;Therapeutic activities;Energy conservation;DME and/or AE instruction      OT Goals(Current goals  can be found in the care plan section)   Acute Rehab OT Goals Patient Stated Goal: go home OT Goal Formulation: With patient Time For Goal Achievement: 08/21/24 Potential to Achieve Goals: Good ADL Goals Pt Will Perform Lower Body Bathing: with modified independence;sit to/from stand;sitting/lateral leans Pt Will Transfer to Toilet: with modified independence;ambulating Additional ADL Goal #1: Pt will demo falls prevention strategies during ADL performance 2/2 trials to promote safety and prevent falls upon return home.   OT Frequency:  Min 2X/week    Co-evaluation              AM-PAC OT 6 Clicks Daily Activity     Outcome Measure Help from another person eating meals?: None Help from another person taking care of personal grooming?: None Help from another person toileting, which includes using toliet, bedpan, or urinal?: A Little Help from another person bathing (including washing, rinsing, drying)?: A Little Help from another person to put on and taking off regular upper body clothing?: None Help from another person to put on and taking off regular lower body clothing?: A Little 6 Click Score: 21   End of Session Nurse Communication: Mobility status  Activity Tolerance: Patient tolerated treatment well Patient left: in bed;with call bell/phone within reach;with bed alarm set  OT Visit Diagnosis: Other abnormalities of gait and mobility (R26.89);Muscle weakness (generalized) (M62.81)                Time: 9192-9169 OT Time Calculation (min): 23 min Charges:  OT General Charges $OT Visit: 1 Visit OT Evaluation $OT Eval Moderate Complexity: 1 Mod OT Treatments $Self Care/Home Management : 8-22 mins Neleh Muldoon Chrismon, OTR/L  08/07/24, 10:12 AM  Taia Bramlett E Chrismon 08/07/2024, 10:10 AM

## 2024-08-07 NOTE — TOC CM/SW Note (Signed)
" ° ° ° °  [facility] REFERRAL        Occupational Therapy * Physical Therapy * Speech Therapy                           DATE 08/07/2024  PATIENT NAME  Luke Rogers, Luke Rogers PATIENT MRN: [id number]         DIAGNOSIS/DIAGNOSIS CODE : K85.90  DATE OF DISCHARGE: 08/07/2024        PRIMARY CARE PHYSICIAN      PCP PHONE/FAX   Fredy Bathe, MD    [phone]    Dear Provider (Name: [facility] outpatient __  Fax: [phone]   I certify that I have examined this patient and that occupational/physical/speech therapy is necessary on an outpatient basis.    The patient has expressed interest in completing their recommended course of therapy at your  location.  Once a formal order from the patient's primary care physician has been obtained, please  contact him/her to schedule an appointment for evaluation at your earliest convenience.   [ X]  Physical Therapy Evaluate and Treat  [  X]  Occupational Therapy Evaluate and Treat  [  ]  Speech Therapy Evaluate and Treat         The patient's primary care physician (listed above) must furnish and be responsible for a formal order such that the recommended services may be furnished while under the primary physician's care, and that the plan of care will be established and reviewed every 30 days (or more often if condition necessitates).   "

## 2024-08-07 NOTE — TOC Progression Note (Addendum)
 Transition of Care (TOC) - Progression Note    Patient Details  Name: Luke Rogers. MRN: [id number] Date of Birth: 1960/09/08  Transition of Care [facility]) CM/SW Contact  Shasta DELENA Daring, RN Phone Number: 08/07/2024, 2:07 PM  Clinical Narrative:     RNCM met with patient. He lives in an apartment in the home of his parents. Father has dementia, mother has memory issues and the suspect dementia with her as well. Patient said the person listed in the chart as his wife is actually his girlfriend, but she now lives in [place].  [facility]  [address] and can afford his medications. Does not have any HH services. Is agreeable to outpatinet PT/OT. Reqeusts referral go to [facility]  RNCM spoke to patient's sister, Connell. She lives in the home as well and helps provide care for the parents. Says she will be managing his care. She stated she will make sure he get to PT and OT appointments.   Advised patient and sister that AS resources have been added to his discharge paperwork.                       Expected Discharge Plan and Services                                               Social Drivers of Health (SDOH) Interventions SDOH Screenings   Food Insecurity: No Food Insecurity (08/06/2024)  Housing: Low Risk (08/06/2024)  Transportation Needs: No Transportation Needs (08/06/2024)  Utilities: Not At Risk (08/06/2024)  Tobacco Use: High Risk (08/05/2024)    Readmission Risk Interventions    01/11/2023    9:17 AM  Readmission Risk Prevention Plan  Transportation Screening Complete  Medication Review (RN Care Manager) Complete  PCP or Specialist appointment within 3-5 days of discharge Complete  HRI or Home Care Consult Patient refused  SW Recovery Care/Counseling Consult Complete  Palliative Care Screening Not Applicable  Skilled Nursing Facility Not Applicable

## 2024-08-07 NOTE — Plan of Care (Signed)

## 2024-08-08 ENCOUNTER — Other Ambulatory Visit: Payer: Self-pay

## 2024-08-21 ENCOUNTER — Emergency Department
Admission: EM | Admit: 2024-08-21 | Discharge: 2024-08-22 | Disposition: A | Discharge status: Home / Self Care | Payer: MEDICAID | Attending: Emergency Medicine | Admitting: Emergency Medicine

## 2024-08-21 ENCOUNTER — Other Ambulatory Visit: Payer: Self-pay

## 2024-08-21 ENCOUNTER — Encounter: Payer: Self-pay | Admitting: *Deleted

## 2024-08-21 ENCOUNTER — Emergency Department: Payer: MEDICAID

## 2024-08-21 DIAGNOSIS — F1090 Alcohol use, unspecified, uncomplicated: Secondary | ICD-10-CM | POA: Insufficient documentation

## 2024-08-21 DIAGNOSIS — Y908 Blood alcohol level of 240 mg/100 ml or more: Secondary | ICD-10-CM | POA: Insufficient documentation

## 2024-08-21 DIAGNOSIS — R079 Chest pain, unspecified: Secondary | ICD-10-CM

## 2024-08-21 DIAGNOSIS — R0789 Other chest pain: Secondary | ICD-10-CM | POA: Insufficient documentation

## 2024-08-21 DIAGNOSIS — Z716 Tobacco abuse counseling: Secondary | ICD-10-CM | POA: Diagnosis not present

## 2024-08-21 LAB — BASIC METABOLIC PANEL WITH GFR
Anion gap: 12 (ref 5–15)
BUN: <5 mg/dL — ABNORMAL LOW (ref 8–23)
CO2: 28 mmol/L (ref 22–32)
Calcium: 8.6 mg/dL — ABNORMAL LOW (ref 8.9–10.3)
Chloride: 105 mmol/L (ref 98–111)
Creatinine, Ser: 0.58 mg/dL — ABNORMAL LOW (ref 0.61–1.24)
GFR, Estimated: >60 mL/min
Glucose, Bld: 105 mg/dL — ABNORMAL HIGH (ref 70–99)
Potassium: 3.4 mmol/L — ABNORMAL LOW (ref 3.5–5.1)
Sodium: 144 mmol/L (ref 135–145)

## 2024-08-21 LAB — CBC
HCT: 40.9 % (ref 39.0–52.0)
Hemoglobin: 14.3 g/dL (ref 13.0–17.0)
MCH: 33.1 pg (ref 26.0–34.0)
MCHC: 35 g/dL (ref 30.0–36.0)
MCV: 94.7 fL (ref 80.0–100.0)
Platelets: 159 10*3/uL (ref 150–400)
RBC: 4.32 MIL/uL (ref 4.22–5.81)
RDW: 12.8 % (ref 11.5–15.5)
WBC: 7.5 10*3/uL (ref 4.0–10.5)
nRBC: 0 % (ref 0.0–0.2)

## 2024-08-21 LAB — TROPONIN T, HIGH SENSITIVITY: Troponin T High Sensitivity: 12 ng/L (ref 0–19)

## 2024-08-21 LAB — ETHANOL: Alcohol, Ethyl (B): 264 mg/dL — ABNORMAL HIGH

## 2024-08-21 NOTE — ED Triage Notes (Signed)
 BIB [facility] from home. Hx of alcoholism and pancreatitis. Reports intermittent cp x several months. No acute change just new episode and just wishes to address tonight. Pt alert and oriented.   EMS VS: 129/80 HR 87 RR 20 96% RA CBG 102  18G LAC 324 ASA pta

## 2024-08-21 NOTE — ED Triage Notes (Addendum)
 Pt to triage via wheelchair.  Pt brought in via [facility] from home  pt has chest pain x 2 hours. No sob.  No n/v   hx alcoholism.  Pt admits to 3 beers today  pt alert.  Iv in place.

## 2024-08-22 LAB — TROPONIN T, HIGH SENSITIVITY: Troponin T High Sensitivity: 8 ng/L (ref 0–19)

## 2024-08-22 LAB — LIPASE, BLOOD: Lipase: 70 U/L — ABNORMAL HIGH (ref 11–51)

## 2024-08-22 LAB — HEPATIC FUNCTION PANEL
ALT: 38 U/L (ref 0–44)
AST: 127 U/L — ABNORMAL HIGH (ref 15–41)
Albumin: 3.9 g/dL (ref 3.5–5.0)
Alkaline Phosphatase: 117 U/L (ref 38–126)
Bilirubin, Direct: 0.6 mg/dL — ABNORMAL HIGH (ref 0.0–0.2)
Indirect Bilirubin: 0.4 mg/dL (ref 0.3–0.9)
Total Bilirubin: 1.1 mg/dL (ref 0.0–1.2)
Total Protein: 8 g/dL (ref 6.5–8.1)

## 2024-08-22 MED ORDER — NICOTINE 7 MG/24HR TD PT24: 7.0000 mg | MEDICATED_PATCH | Freq: Every day | TRANSDERMAL | 0 refills | Status: AC

## 2024-08-22 MED ORDER — ONDANSETRON 4 MG PO TBDP
4.0000 mg | ORAL_TABLET | Freq: Once | ORAL | Status: AC
  Administered 2024-08-22: 4 mg via ORAL
  Filled 2024-08-22: qty 1

## 2024-08-22 MED ORDER — DIAZEPAM 5 MG PO TABS
10.0000 mg | ORAL_TABLET | Freq: Once | ORAL | Status: AC
  Administered 2024-08-22: 10 mg via ORAL
  Filled 2024-08-22: qty 2

## 2024-08-22 MED ORDER — DIAZEPAM 5 MG/ML IJ SOLN: 10.0000 mg | Freq: Once | INTRAMUSCULAR | Status: DC

## 2024-08-22 MED ORDER — NICOTINE POLACRILEX 4 MG MT LOZG: 4.0000 mg | LOZENGE | OROMUCOSAL | 0 refills | Status: AC | PRN

## 2024-08-22 NOTE — ED Provider Notes (Signed)
 "  [facility] Provider Note    Event Date/Time   First MD Initiated Contact with Patient 08/22/24 0003     (approximate)   History   Chest Pain   HPI  Luke Rogers. is a 64 y.o. male   Past medical history of alcohol use, smoker, alcoholic pancreatitis, hepatitis C here with left-sided intermittent chest pain.  Resolved by the time he got here.  Was nonexertional nonradiating.  No respiratory infectious symptoms.  No shortness of breath.  No significant GI discomfort since his discharge from hospital 08/07/2024.  Continues to drink alcohol last drink was around 9 or 10 PM  Continues to smoke but has cut back to around 5 cigarettes daily.    External Medical Documents Reviewed: Recent hospitalization notes      Physical Exam   Triage Vital Signs: ED Triage Vitals [08/21/24 2144]  Encounter Vitals Group     BP (!) 120/99     Girls Systolic BP Percentile      Girls Diastolic BP Percentile      Boys Systolic BP Percentile      Boys Diastolic BP Percentile      Pulse Rate 87     Resp 18     Temp 98.3 F (36.8 C)     Temp Source Oral     SpO2 96 %     Weight 165 lb (74.8 kg)     Height 6' 3 (1.905 m)     Head Circumference      Peak Flow      Pain Score 3     Pain Loc      Pain Education      Exclude from Growth Chart     Most recent vital signs: Vitals:   08/21/24 2144  BP: (!) 120/99  Pulse: 87  Resp: 18  Temp: 98.3 F (36.8 C)  SpO2: 96%    General: Awake, no distress.  CV:  Good peripheral perfusion.  Resp:  Normal effort.  Abd:  No distention.  Other:  Well-appearing gentleman with normal vital signs resting comfortably in the stretcher in the hallway.  Benign abdominal exam nonfocal nonperitoneal throughout.  Lungs clear without any focality or wheezing.  Heart sounds without obvious murmur.  Skin appears warm well-perfused.  No frank evidence of acute withdrawal nor significant intoxication at this time.   ED  Results / Procedures / Treatments   Labs (all labs ordered are listed, but only abnormal results are displayed) Labs Reviewed  BASIC METABOLIC PANEL WITH GFR - Abnormal; Notable for the following components:      Result Value   Potassium 3.4 (*)    Glucose, Bld 105 (*)    BUN <5 (*)    Creatinine, Ser 0.58 (*)    Calcium  8.6 (*)    All other components within normal limits  ETHANOL - Abnormal; Notable for the following components:   Alcohol, Ethyl (B) 264 (*)    All other components within normal limits  CBC  LIPASE, BLOOD  HEPATIC FUNCTION PANEL  TROPONIN T, HIGH SENSITIVITY  TROPONIN T, HIGH SENSITIVITY     I ordered and reviewed the above labs they are notable for cell counts electrolytes largely unremarkable, alcohol level of 264.  EKG  ED ECG REPORT I, Ginnie Shams, the attending physician, personally viewed and interpreted this ECG.   Date: 08/22/2024  EKG Time: 2149  Rate: 76  Rhythm: sinus  Axis: nl  Intervals:nl  ST&T  Change: no stemi    RADIOLOGY I independently reviewed and interpreted chest x-ray and see no obvious focality or pneumothorax I also reviewed radiologist's formal read.   PROCEDURES:  Critical Care performed: No  Procedures   MEDICATIONS ORDERED IN ED: Medications  ondansetron  (ZOFRAN -ODT) disintegrating tablet 4 mg (has no administration in time range)  diazepam  (VALIUM ) tablet 10 mg (has no administration in time range)     IMPRESSION / MDM / ASSESSMENT AND PLAN / ED COURSE  I reviewed the triage vital signs and the nursing notes.                                Patient's presentation is most consistent with acute presentation with potential threat to life or bodily function.  Differential diagnosis includes, but is not limited to, ACS, dissection, PE, pneumothorax, respiratory infection, pancreatitis, musculoskeletal pain considered but less likely esophageal rupture   The patient is on the cardiac monitor to evaluate for  evidence of arrhythmia and/or significant heart rate changes.  MDM:    Very nonspecific intermittent left-sided chest pain without any other associated concerning symptoms or respiratory symptoms.  Given his age and risk factors will assess for ACS with EKG and serial troponin.  Doubt respiratory infection. Recent GI illness considered esophageal rupture but given intermittent nature of pain, none currently, recent imaging, normal chest x-ray without any free air, I highly doubt.  Drinks daily.  Alcohol level 260s no significant intoxication on my exam nor in acute withdrawal.  She does feel anxious about being back in the hospital will give Valium  for anxiety only.  He plans to meet with [facility] for alcohol detox after the weekend.  I encouraged him to follow through with this plan.   I considered hospitalization for admission or observation however given nondescript symptoms and the stable patient otherwise and plan for outpatient follow-up for his alcohol use, I plan on discharge it is supposing that the remaining labs including LFTs lipase and troponin are not markedly abnormal.  -- I spent 5 minutes counseling this patient on smoking cessation.  We spoke about the patient's current tobacco use, impact of smoking, assessed willingness to quit, methods for cessation including medical management and nicotine  replacement therapy (which I prescribed to the patient) and advised follow-up with primary doctor to continue to address smoking cessation.         FINAL CLINICAL IMPRESSION(S) / ED DIAGNOSES   Final diagnoses:  Nonspecific chest pain  Alcohol use  Encounter for smoking cessation counseling     Rx / DC Orders   ED Discharge Orders          Ordered    nicotine  (NICODERM CQ  - DOSED IN MG/24 HR) 7 mg/24hr patch  Daily        08/22/24 0044    nicotine  polacrilex (NICOTINE  MINI) 4 MG lozenge  As needed        08/22/24 0044             Note:  This document was prepared  using Dragon voice recognition software and may include unintentional dictation errors.    Cyrena Mylar, MD 08/22/24 [phone]  "

## 2024-08-22 NOTE — Discharge Instructions (Signed)
 Fortunately your evaluation in the emerged permit did not show any emergency conditions like heart attack that account for your chest pain today.  I encourage you to go to detox program as you intend to help with your alcohol use.  Thank you for choosing us  for your health care today!  Please see your primary doctor this week for a follow up appointment.   If you have any new, worsening, or unexpected symptoms call your doctor right away or come back to the emergency department for reevaluation.  It was my pleasure to care for you today.   Ginnie EDISON Cyrena, MD
